# Patient Record
Sex: Male | Born: 1975 | Race: Black or African American | Hispanic: No | Marital: Married | Smoking: Never smoker
Health system: Southern US, Community
[De-identification: ages and names within clinical notes are randomized; demographics above are authoritative.]

## PROBLEM LIST (undated history)

## (undated) DIAGNOSIS — I639 Cerebral infarction, unspecified: Secondary | ICD-10-CM

## (undated) DIAGNOSIS — I10 Essential (primary) hypertension: Secondary | ICD-10-CM

## (undated) HISTORY — PX: NO PAST SURGERIES: SHX2092

## (undated) HISTORY — DX: Cerebral infarction, unspecified: I63.9

---

## 2014-10-20 ENCOUNTER — Other Ambulatory Visit: Payer: Self-pay | Admitting: Radiology

## 2014-10-20 ENCOUNTER — Other Ambulatory Visit (HOSPITAL_COMMUNITY): Payer: Self-pay | Admitting: Internal Medicine

## 2014-10-20 ENCOUNTER — Encounter: Payer: Self-pay | Admitting: *Deleted

## 2014-10-20 DIAGNOSIS — R531 Weakness: Secondary | ICD-10-CM

## 2014-10-21 ENCOUNTER — Encounter (HOSPITAL_COMMUNITY): Payer: Self-pay | Admitting: *Deleted

## 2014-10-21 ENCOUNTER — Inpatient Hospital Stay (HOSPITAL_COMMUNITY)
Admission: RE | Admit: 2014-10-21 | Discharge: 2014-11-18 | DRG: 057 | Disposition: A | Discharge status: Home Health | Payer: BC Managed Care – PPO | Source: Other Acute Inpatient Hospital | Attending: Physical Medicine & Rehabilitation | Admitting: Physical Medicine & Rehabilitation

## 2014-10-21 ENCOUNTER — Other Ambulatory Visit (HOSPITAL_COMMUNITY): Payer: Self-pay | Admitting: Physician Assistant

## 2014-10-21 ENCOUNTER — Ambulatory Visit (HOSPITAL_COMMUNITY): Admission: RE | Admit: 2014-10-21 | Payer: Self-pay | Source: Ambulatory Visit

## 2014-10-21 DIAGNOSIS — G8190 Hemiplegia, unspecified affecting unspecified side: Secondary | ICD-10-CM

## 2014-10-21 DIAGNOSIS — I6612 Occlusion and stenosis of left anterior cerebral artery: Secondary | ICD-10-CM | POA: Diagnosis present

## 2014-10-21 DIAGNOSIS — R4587 Impulsiveness: Secondary | ICD-10-CM | POA: Diagnosis present

## 2014-10-21 DIAGNOSIS — K5900 Constipation, unspecified: Secondary | ICD-10-CM | POA: Diagnosis not present

## 2014-10-21 DIAGNOSIS — G8110 Spastic hemiplegia affecting unspecified side: Secondary | ICD-10-CM | POA: Diagnosis not present

## 2014-10-21 DIAGNOSIS — I63512 Cerebral infarction due to unspecified occlusion or stenosis of left middle cerebral artery: Secondary | ICD-10-CM | POA: Diagnosis present

## 2014-10-21 DIAGNOSIS — I69391 Dysphagia following cerebral infarction: Secondary | ICD-10-CM

## 2014-10-21 DIAGNOSIS — I69390 Apraxia following cerebral infarction: Secondary | ICD-10-CM | POA: Diagnosis not present

## 2014-10-21 DIAGNOSIS — Z8673 Personal history of transient ischemic attack (TIA), and cerebral infarction without residual deficits: Secondary | ICD-10-CM

## 2014-10-21 DIAGNOSIS — R1312 Dysphagia, oropharyngeal phase: Secondary | ICD-10-CM | POA: Diagnosis present

## 2014-10-21 DIAGNOSIS — R12 Heartburn: Secondary | ICD-10-CM | POA: Diagnosis present

## 2014-10-21 DIAGNOSIS — I10 Essential (primary) hypertension: Secondary | ICD-10-CM | POA: Diagnosis present

## 2014-10-21 DIAGNOSIS — G8191 Hemiplegia, unspecified affecting right dominant side: Secondary | ICD-10-CM

## 2014-10-21 DIAGNOSIS — R482 Apraxia: Secondary | ICD-10-CM | POA: Diagnosis not present

## 2014-10-21 DIAGNOSIS — I69351 Hemiplegia and hemiparesis following cerebral infarction affecting right dominant side: Principal | ICD-10-CM

## 2014-10-21 DIAGNOSIS — I69320 Aphasia following cerebral infarction: Secondary | ICD-10-CM | POA: Diagnosis not present

## 2014-10-21 DIAGNOSIS — I69359 Hemiplegia and hemiparesis following cerebral infarction affecting unspecified side: Secondary | ICD-10-CM

## 2014-10-21 DIAGNOSIS — R531 Weakness: Secondary | ICD-10-CM

## 2014-10-21 HISTORY — DX: Essential (primary) hypertension: I10

## 2014-10-21 LAB — CBC
HEMATOCRIT: 41.4 % (ref 39.0–52.0)
Hemoglobin: 13.7 g/dL (ref 13.0–17.0)
MCH: 30.3 pg (ref 26.0–34.0)
MCHC: 33.1 g/dL (ref 30.0–36.0)
MCV: 91.6 fL (ref 78.0–100.0)
PLATELETS: 212 10*3/uL (ref 150–400)
RBC: 4.52 MIL/uL (ref 4.22–5.81)
RDW: 13.7 % (ref 11.5–15.5)
WBC: 7 10*3/uL (ref 4.0–10.5)

## 2014-10-21 LAB — CREATININE, SERUM
Creatinine, Ser: 1.36 mg/dL — ABNORMAL HIGH (ref 0.61–1.24)
GFR calc Af Amer: >60 mL/min (ref >60)
GFR calc non Af Amer: >60 mL/min (ref >60)

## 2014-10-21 MED ORDER — ONDANSETRON HCL 4 MG PO TABS: 4.0000 mg | ORAL_TABLET | Freq: Four times a day (QID) | ORAL | Status: DC | PRN

## 2014-10-21 MED ORDER — STARCH (THICKENING) PO POWD: ORAL | Status: DC | PRN

## 2014-10-21 MED ORDER — SODIUM CHLORIDE 0.45 % IV SOLN
INTRAVENOUS | Status: DC
  Administered 2014-10-21 – 2014-10-26 (×5): via INTRAVENOUS

## 2014-10-21 MED ORDER — ACETAMINOPHEN 325 MG PO TABS
325.0000 mg | ORAL_TABLET | ORAL | Status: DC | PRN
  Administered 2014-10-22 – 2014-10-29 (×8): 650 mg via ORAL
  Filled 2014-10-21 (×9): qty 2

## 2014-10-21 MED ORDER — RESOURCE THICKENUP CLEAR PO POWD
ORAL | Status: DC | PRN
  Filled 2014-10-21: qty 125

## 2014-10-21 MED ORDER — SORBITOL 70 % SOLN
60.0000 mL | Status: AC
  Administered 2014-10-21: 60 mL via ORAL
  Filled 2014-10-21: qty 60

## 2014-10-21 MED ORDER — ONDANSETRON HCL 4 MG/2ML IJ SOLN: 4.0000 mg | Freq: Four times a day (QID) | INTRAMUSCULAR | Status: DC | PRN

## 2014-10-21 MED ORDER — PANTOPRAZOLE SODIUM 40 MG PO TBEC
40.0000 mg | DELAYED_RELEASE_TABLET | Freq: Every day | ORAL | Status: DC
  Administered 2014-10-22 – 2014-10-26 (×4): 40 mg via ORAL
  Filled 2014-10-21 (×5): qty 1

## 2014-10-21 MED ORDER — ENOXAPARIN SODIUM 40 MG/0.4ML ~~LOC~~ SOLN
40.0000 mg | SUBCUTANEOUS | Status: DC
  Administered 2014-10-21 – 2014-10-30 (×7): 40 mg via SUBCUTANEOUS
  Filled 2014-10-21 (×9): qty 0.4

## 2014-10-21 MED ORDER — ASPIRIN 325 MG PO TABS
325.0000 mg | ORAL_TABLET | Freq: Every day | ORAL | Status: DC
  Administered 2014-10-22 – 2014-11-18 (×26): 325 mg via ORAL
  Filled 2014-10-21 (×30): qty 1

## 2014-10-21 MED ORDER — SORBITOL 70 % SOLN
30.0000 mL | Freq: Every day | Status: DC | PRN
  Administered 2014-10-22: 30 mL via ORAL
  Filled 2014-10-21: qty 30

## 2014-10-21 NOTE — PMR Pre-admission (Signed)
[facility] PMR Admission Coordinator Pre-Admission Assessment  Patient: Mario [name] is an 39 y.o., male MRN: [id number] DOB: Nov 22, 1975 Height: '5\' 11"'  (180.3 cm) Weight: 103.42 kg (228 lb)  Insurance Information HMO: PPO: PCP: IPA: 80/20: OTHER: State Blue Options PRIMARY: [place] Plan Policy#: [id number] Subscriber: self CM Name: Azucena Kuba, RN Phone#: [phone] Fax#: [phone] Approval given on 10-21-14 for 2 weeks and updates due to [facility] at above fax on 9-48-01 Pre-Cert#: [id number] Employer: [place] Benefits: Phone #: [phone] Name: online Eff. Date: 06-05-14 Deduct: $1054 (none met) Out of Pocket Max: [phone] (none met) Life Max: none CIR: 70/30%, $329 copay per admit, pre-auth needed SNF: 70/30%, 100 days max Outpatient: 70/30% Co-Pay: $72 copay, no visit limits Home Health: 70/30% Co-Pay: none, no visit limits DME: 70/30% Co-Pay: none Providers: in network  Emergency Contact Information Contact Information    Name Relation Home Work Mobile   Melchor,Tangii Spouse   [phone]      Current Medical History  Patient Admitting Diagnosis: L MCA CVA  History of Present Illness: 39 year old right handed male with history of hypertension maintained on hydrochlorothiazide. 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 He presented to [facility] 10/19/2014 with acute onset of right sided weakness, headache and inability to speak. Cranial CT scan negative. MRI of the head showed moderate volume of patchy acute infarct left MCA territory. Most confluent involvement at the corona radiata and lentiform nuclei. No mass effect. Carotid Dopplers with no ICA stenosis. 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 Collateral supply of left MCA branch vessels decreased in number and caliber in the area of acute infarct. Carotid  Dopplers with ejection fraction of 65% without emboli. Patient did not receive TPA. Placed on aspirin for CVA prophylaxis. Subcutaneous Lovenox for DVT prophylaxis. Currently on a pured diet with honey thick liquids. Therapies have been initiated. Requiring min mod assist for basic mobility. Patient was admitted for comprehensive rehabilitation program.  Patient's medical record from [facility] has been reviewed by the rehabilitation admission coordinator and physician.  Past Medical History  No past medical history on file.  Family History  family history is not on file.  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 He enjoys hunting and fishing and is involved in his church.    Home Assistive Devices / Equipment Home Assistive Devices/Equipment: None   Prior Functional Level Current Functional Level  Bed Mobility  Independent  Mod assist   Transfers  Independent  Mod assist (Mod assist x 2 with R LE buckling)   Mobility - Walk/Wheelchair  Independent  Mod assist (Mod assist x 2 to amb. 2' to chair, R LE buckling)   Upper Body Dressing  Independent  Mod assist   Lower Body Dressing  Independent  Other (not assessed, anticipate needs)   Grooming  Independent  Other (not assessed, anticipate needs, Pt is R handed (flaccid R UE))   Eating/Drinking  Independent  Other (SLP note says difficulty initiating oral motor mvmts)   Toilet Transfer  Independent  Other (not assessed, anticipate  needs)   Bladder Continence  WFL  currently using urinal with assist   Bowel Management  WFL  last BM on 5-15 per wife   Stair Climbing   Independent  Other (not assessed, anticipate needs)   Communication  Independent  global aphasia, sometimes nods appropriately to ?s   Memory  WFL  unable to assess   Cooking/Meal Prep  Independent     Housework  Independent    Money Management  Independent    Driving   Independent     Special needs/care consideration BiPAP/CPAP no CPM no  Continuous Drip IV no  Dialysis no  Life Vest no Oxygen no  Special Bed no  Trach Size no Wound Vac (area) no  Skin - no current  Bowel mgmt: last BM on 5-15 per wife Bladder mgmt: currently using urinal with assist Diabetic mgmt no  Previous Home Environment Living Arrangements: Spouse/significant other Lives With: Spouse Available Help at Discharge: Family Type of Home: House Home Layout: One level  Discharge Living Setting Plans for Discharge Living Setting: Patient's home Type of Home at Discharge: House Discharge Home Layout: One level Does the patient have any problems obtaining your medications?: No  Social/Family/Support Systems Patient Roles: Spouse, Other (Comment) (works full time as Architect, enjoys hunting/fishing) Sport and exercise psychologist Information: wife Marylou Flesher is primary contact Anticipated Caregiver: wife and supportive family (pt's parents) Anticipated Caregiver's Contact Information: see above Ability/Limitations of Caregiver: Wife is a Education officer, museum and will be out of school in early June. She will be avail 24-7 after that. In addition, pt's parents are very supportive. Caregiver Availability: 24/7 Discharge Plan Discussed with Primary Caregiver: Yes Is Caregiver In Agreement with Plan?: Yes Does Caregiver/Family have Issues with Lodging/Transportation while Pt is in Rehab?:  No  Goals/Additional Needs Patient/Family Goal for Rehab: Supervision and min assist with PT/OT, min assist with SLP Expected length of stay: 14-18 days Cultural Considerations: pt and his wife are very involved in their [place] Needs: pured diet with honey thick liquids, SLP notes motor apraxia with swallowing. Equipment Needs: to be determined Pt/Family Agrees to Admission and willing to participate: Yes Program Orientation Provided & Reviewed with Pt/Caregiver Including Roles & Responsibilities: Yes  Patient Condition: I received this referral on 10-20-14 and have reviewed pt's case with rehab team. 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 Pt was admitted to [facility] on 10-19-14 after waking up unable to speak and with R UE/LE weakness. Work up revealed L MCA CVA. Pt is currently requiring moderate assist of 2 for limited transfers and limited gait to chair with a flaccid right UE/LE. In addition, pt has global aphasia and is following commands inconsistently. Pt will benefit from further skilled speech services to address pt's communication needs/global aphasia and for diet advancement beyond his current honey puree diet. Also, skilled SLP services will address cognitive issues associated with L MCA CVA. Pt will benefit greatly from the multi-disciplinary team of skilled PT, OT and rehab nursing to maximize his functional return following this new stroke. PT, OT and rehab nursing will focus on increasing strength for greater independence with bed mobility, transfers, gait and self care skills. Rehab nursing will focus on pt/family education for medication management, stroke education and any bowel/bladder care needs. Pt will also benefit from rehab physician intervention to monitor his medical management following this new CVA in a patient with no significant past medical history. Discussed  case with rehab team  and Dr. Naaman Plummer who feel that pt is a good inpatient rehab candidate. We received medical clearance from MD at [facility]. Pt and his wife are motivated to come to inpatient rehab and will benefit from the intensive services of skilled therapy under rehab physician guidance. Pt will be admitted today on 10-21-14.  Preadmission Screen Completed By: Nanetta Batty, PT, 10/21/2014 9:49 AM ______________________________________________________________________  Discussed status with Dr. Naaman Plummer on 10-21-14 at 0930 and received telephone approval for admission today.  Admission Coordinator: Nanetta Batty, PT, time 0930/Date 10-21-14   Assessment/Plan: Diagnosis: left MCA infarct 1. Does the need for close, 24 hr/day Medical supervision in concert with the patient's rehab needs make it unreasonable for this patient to be served in a less intensive setting? Yes 2. Co-Morbidities requiring supervision/potential complications: htn, post-stroke sequelae 3. Due to bladder management, bowel management, safety, skin/wound care, disease management, medication administration, pain management and patient education, does the patient require 24 hr/day rehab nursing? Yes 4. Does the patient require coordinated care of a physician, rehab nurse, PT (1-2 hrs/day, 5 days/week), OT (1-2 hrs/day, 5 days/week) and SLP (1-2 hrs/day, 5 days/week) to address physical and functional deficits in the context of the above medical diagnosis(es)? Yes Addressing deficits in the following areas: balance, endurance, locomotion, strength, transferring, bowel/bladder control, bathing, dressing, feeding, grooming, toileting, cognition, speech, language, swallowing and psychosocial support 5. Can the patient actively participate in an intensive therapy program of at least 3 hrs of therapy 5 days a week? Yes 6. The potential for patient to make measurable gains while on inpatient rehab is  excellent 7. Anticipated functional outcomes upon discharge from inpatients are: supervision and min assist PT, supervision and min assist OT, min assist SLP 8. Estimated rehab length of stay to reach the above functional goals is: 14-18 days 9. Does the patient have adequate social supports to accommodate these discharge functional goals? Yes 10. Anticipated D/C setting: Home 11. Anticipated post D/C treatments: HH therapy and Outpatient therapy 12. Overall Rehab/Functional Prognosis: excellent    RECOMMENDATIONS: This patient's condition is appropriate for continued rehabilitative care in the following setting: CIR Patient has agreed to participate in recommended program. Yes Note that insurance prior authorization may be required for reimbursement for recommended care.  Comment: admit to inpatient rehab today  Meredith Staggers, MD, [place] 10/21/2014   Nanetta Batty, PT 10/21/2014

## 2014-10-21 NOTE — Plan of Care (Signed)
Problem: RH BOWEL ELIMINATION Goal: RH STG MANAGE BOWEL WITH ASSISTANCE STG Manage Bowel with Assistance.  Outcome: Not Progressing LBM unknown sorbitol given  Goal: RH STG MANAGE BOWEL W/MEDICATION W/ASSISTANCE STG Manage Bowel with Medication with Assistance.  Outcome: Not Progressing LBM unknown sorbitol given

## 2014-10-21 NOTE — Progress Notes (Signed)
Patient arrived to unit at 1500 from [facility]. Patient welcomed to unit with spouse and father at bedside. Review the rehab booklet and process with family and patient. Educated on safety and importance of safety plan with family with verbal understanding. Bed alarm in place side rails times 3  , call bell within reach. Family at bedside

## 2014-10-21 NOTE — PMR Pre-admission (Deleted)
[facility] PMR Admission Coordinator Pre-Admission Assessment  Patient: Mario [name] is an 39 y.o., male MRN: [id number] DOB: March 05, 1976 Height: '5\' 11"'  (180.3 cm) Weight: 103.42 kg (228 lb)  Insurance Information HMO:     PPO:      PCP:      IPA:      80/20:      OTHER: State Blue Options PRIMARY: [place] Plan      Policy#: [id number]      Subscriber: self CM Name: Azucena Kuba, RN      Phone#: [phone]     Fax#: [phone] Approval given on 10-21-14 for 2 weeks and updates due to [facility] at above fax on 10-12-30 Pre-Cert#: [id number]      Employer: [place] Benefits:  Phone #: [phone]     Name: online Eff. Date: 06-05-14     Deduct: $1054 (none met)      Out of Pocket Max: [phone] (none met)      Life Max: none CIR: 70/30%, $329 copay per admit, pre-auth needed      SNF: 70/30%, 100 days max Outpatient: 70/30%     Co-Pay: $72 copay, no visit limits Home Health: 70/30%      Co-Pay: none, no visit limits DME: 70/30%     Co-Pay: none Providers: in network  Emergency Contact Information Contact Information    Name Relation Home Work Mobile   Reindl,Tangii Spouse   [phone]      Current Medical History  Patient Admitting Diagnosis: L MCA CVA  History of Present Illness: 39 year old right handed male with history of hypertension maintained on hydrochlorothiazide. 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 Cranial CT scan negative. MRI of the head showed moderate volume of patchy acute infarct left MCA territory. Most confluent involvement at the corona radiata and lentiform nuclei. No mass effect. Carotid Dopplers with no ICA stenosis. 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 Collateral supply of left MCA branch vessels decreased in number and caliber in the area of acute infarct. Carotid Dopplers with  ejection fraction of 65% without emboli. Patient did not receive TPA. Placed on aspirin for CVA prophylaxis. Subcutaneous Lovenox for DVT prophylaxis. Currently on a pured diet with honey thick liquids. Therapies have been initiated. Requiring min mod assist for basic mobility. Patient was admitted for comprehensive rehabilitation program.  Patient's medical record from [facility] has been reviewed by the rehabilitation admission coordinator and physician.  Past Medical History  No past medical history on file.  Family History  family history is not on file.  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 He enjoys hunting and fishing and is involved in his church.    Home Assistive Devices / Equipment Home Assistive Devices/Equipment: None   Prior Functional Level Current Functional Level  Bed Mobility  Independent  Mod assist   Transfers  Independent  Mod assist (Mod assist x 2 with R LE buckling)   Mobility - Walk/Wheelchair  Independent  Mod assist (Mod assist x 2 to amb. 2' to chair, R LE buckling)   Upper Body Dressing  Independent  Mod assist   Lower Body Dressing  Independent  Other (not assessed, anticipate needs)   Grooming  Independent  Other (not assessed, anticipate needs, Pt is R handed (flaccid R UE))   Eating/Drinking  Independent  Other (SLP note says difficulty initiating oral motor mvmts)   Toilet Transfer  Independent  Other (not assessed, anticipate needs)   Bladder Continence   WFL  currently using  urinal with assist   Bowel Management  WFL  last BM on 5-15 per wife   Stair Climbing   Independent  Other (not assessed, anticipate needs)   Communication  Independent  global aphasia, sometimes nods appropriately to ?s   Memory  WFL  unable to assess   Cooking/Meal Prep  Independent      Housework  Independent    Money Management  Independent    Driving   Independent     Special needs/care consideration BiPAP/CPAP no CPM no  Continuous Drip IV no  Dialysis no         Life Vest no Oxygen no  Special Bed no  Trach Size no Wound Vac (area) no       Skin - no current                                Bowel mgmt: last BM on 5-15 per wife Bladder mgmt: currently using urinal with assist Diabetic mgmt no  Previous Home Environment Living Arrangements: Spouse/significant other  Lives With: Spouse Available Help at Discharge: Family Type of Home: House Home Layout: One level  Discharge Living Setting Plans for Discharge Living Setting: Patient's home Type of Home at Discharge: House Discharge Home Layout: One level Does the patient have any problems obtaining your medications?: No  Social/Family/Support Systems Patient Roles: Spouse, Other (Comment) (works full time as Architect, enjoys hunting/fishing) Sport and exercise psychologist Information: wife Marylou Flesher is primary contact Anticipated Caregiver: wife and supportive family (pt's parents) Anticipated Caregiver's Contact Information: see above Ability/Limitations of Caregiver: Wife is a Education officer, museum and will be out of school in early June. She will be avail 24-7 after that. In addition, pt's parents are very supportive. Caregiver Availability: 24/7 Discharge Plan Discussed with Primary Caregiver: Yes Is Caregiver In Agreement with Plan?: Yes Does Caregiver/Family have Issues with Lodging/Transportation while Pt is in Rehab?: No  Goals/Additional Needs Patient/Family Goal for Rehab: Supervision and min assist with PT/OT, min  assist with SLP Expected length of stay: 14-18 days Cultural Considerations: pt and his wife are very involved in their [place] Needs: pured diet with honey thick liquids, SLP notes motor apraxia with swallowing. Equipment Needs: to be determined Pt/Family Agrees to Admission and willing to participate: Yes Program Orientation Provided & Reviewed with Pt/Caregiver Including Roles  & Responsibilities: Yes  Patient Condition: I received this referral on 10-20-14 and have reviewed pt's case with rehab team. 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 Pt was admitted to [facility] on 10-19-14 after waking up unable to speak and with R UE/LE weakness. Work up revealed L MCA CVA. Pt is currently requiring moderate assist of 2 for limited transfers and limited gait to chair with a flaccid right UE/LE. In addition, pt has global aphasia and  is following commands inconsistently. Pt will benefit from further skilled speech services to address pt's communication needs/global aphasia and for diet advancement beyond his current honey puree diet. Also, skilled SLP services will address cognitive issues associated with L MCA CVA. Pt will benefit greatly from the multi-disciplinary team of skilled PT, OT and rehab nursing to maximize his functional return following this new stroke. PT, OT and rehab nursing will focus on increasing strength for greater independence with bed mobility, transfers, gait and self care skills. Rehab nursing will focus on pt/family education for medication management, stroke education and any bowel/bladder care needs. Pt will also benefit from rehab physician intervention to monitor his medical management following this new CVA in a patient with no significant past medical history. Discussed case with rehab team and Dr. Naaman Plummer who feel that pt is a good inpatient rehab candidate. We received  medical clearance from MD at [facility]. Pt and his wife are motivated to come to inpatient rehab and will benefit from the intensive services of skilled therapy under rehab physician guidance. Pt will be admitted today on 10-21-14.  Preadmission Screen Completed By:  Nanetta Batty, PT, 10/21/2014 9:49 AM ______________________________________________________________________   Discussed status with Dr. Naaman Plummer on 10-21-14 at 0930 and received telephone approval for admission today.  Admission Coordinator:  Nanetta Batty, PT, time 0930/Date  10-21-14   Assessment/Plan: Diagnosis: left MCA infarct 1. Does the need for close, 24 hr/day  Medical supervision in concert with the patient's rehab needs make it unreasonable for this patient to be served in a less intensive setting? Yes 2. Co-Morbidities requiring supervision/potential complications: htn, post-stroke sequelae 3. Due to bladder management, bowel management, safety, skin/wound care, disease management, medication administration, pain management and patient education, does the patient require 24 hr/day rehab nursing? Yes 4. Does the patient require coordinated care of a physician, rehab nurse, PT (1-2 hrs/day, 5 days/week), OT (1-2 hrs/day, 5 days/week) and SLP (1-2 hrs/day, 5 days/week) to address physical and functional deficits in the context of the above medical diagnosis(es)? Yes Addressing deficits in the following areas: balance, endurance, locomotion, strength, transferring, bowel/bladder control, bathing, dressing, feeding, grooming, toileting, cognition, speech, language, swallowing and psychosocial support 5. Can the patient actively participate in an intensive therapy program of at least 3 hrs of therapy 5 days a week? Yes 6. The potential for patient to make measurable gains while on inpatient rehab is excellent 7. Anticipated functional outcomes upon discharge from inpatients are: supervision and min assist PT,  supervision and min assist OT, min assist SLP 8. Estimated rehab length of stay to reach the above functional goals is: 14-18 days 9. Does the patient have adequate social supports to accommodate these discharge functional goals? Yes 10. Anticipated D/C setting: Home 11. Anticipated post D/C treatments: HH therapy and Outpatient therapy 12. Overall Rehab/Functional Prognosis: excellent    RECOMMENDATIONS: This patient's condition is appropriate for continued rehabilitative care in the following setting: CIR Patient has agreed to participate in recommended program. Yes Note that insurance prior authorization may be required for reimbursement for recommended care.  Comment: admit to inpatient rehab today  Meredith Staggers, MD, [place] 10/21/2014   Nanetta Batty, PT 10/21/2014

## 2014-10-21 NOTE — H&P (Signed)
Expand All Collapse All      Physical Medicine and Rehabilitation Admission H&P   Chief complaint: Weakness  HPI: 39 year old right handed male with history of hypertension maintained on hydrochlorothiazide. 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 [facility] with acute onset of right sided weakness, headache and inability to speak. Cranial CT scan negative. MRI of the head showed moderate volume of patchy acute infarct left MCA territory. Most confluent involvement at the corona radiata and lentiform nuclei. No mass effect. Carotid Dopplers with no ICA stenosis. 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 Collateral supply of left MCA branch vessels decreased in number and caliber in the area of acute infarct. Carotid Dopplers with ejection fraction of 65% without emboli. Patient did not receive TPA. Placed on aspirin for CVA prophylaxis. Subcutaneous Lovenox for DVT prophylaxis. Currently on a pured diet with honey thick liquids. Therapies have been initiated. Requiring min mod assist for basic mobility. Patient was admitted for comprehensive rehabilitation program  Review of Systems  Gastrointestinal: Positive for heartburn.  Neurological: Positive for weakness.  All other systems reviewed and are negative.    Family history of hypertension  Social History: Denies tobacco or alcohol  Allergies: No allergies Medications prior to admission. Hydrochlorothiazide 12.5 mg daily  Home:   independent prior to admission living with his wife. He works as a Airline pilottax auditor. One level home  Functional History:   independent prior to admission  Functional Status:  Mobility:     minimal assist supine to sit and sit to supine. Moderate assist bed to chair transfers.      ADL:   min mod assist  Cognition:     aphasic  Physical Exam: 128/70. Pulse 78. Respirations 18. Temperature 90.8 Physical Exam  Vitals  reviewed. Constitutional: He appears well-developed. No distress. Large framed man HENT: dentition good. Oral mucosa pink and moist Head: Normocephalic.  Eyes: EOM are normal.  Neck: Normal range of motion. Neck supple. No thyromegaly present.  Cardiovascular: Normal rate and regular rhythm. no murmur Respiratory: Effort normal and breath sounds normal. No respiratory distress. No wheezes, rales GI: Soft. Bowel sounds are normal. He exhibits no distension.  Neurological: He is alert.  Global aphasia. Follows visual use. Makes eye contact with examiner. Can follow some basic commands with heavy, tactile cues----otherwise does not follow commands. RUE and RLE 0/5 prox to distal. Does not sense pain on right side. Does sense pain on left. Moves left side spontaneously. Skin: Skin is warm and dry.  Psych: generally smilling and cooperative   Lab Results Last 48 Hours    No results found for this or any previous visit (from the past 48 hour(s)).    Imaging Results (Last 48 hours)    No results found.       Medical Problem List and Plan: 1. 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 DVT Prophylaxis/Anticoagulation: Subcutaneous Lovenox. Monitor platelet counts and any signs of bleeding 3. Pain Management: Tylenol as needed 4. Dysphagia. Dysphagia 1 honey liquids. Monitor hydration, encourage po 5. Neuropsych: This patient is capable of making decisions on his own behalf. 6. Skin/Wound Care: Routine skin checks 7. Fluids/Electrolytes/Nutrition: Routine eye and nose with follow-up chemistries 8. Hypertension. No current antihypertensive medication. Patient on hydrochlorothiazide 12.5 mg daily prior to admission. Monitor with increased mobility     Post Admission Physician Evaluation: 1. Functional deficits secondary to left MCA infarct. 2. Patient is admitted to  receive collaborative, interdisciplinary care between the physiatrist, rehab nursing staff, and therapy  team. 3. Patient's level of medical complexity and substantial therapy needs in context of that medical necessity cannot be provided at a lesser intensity of care such as a SNF. 4. Patient has experienced substantial functional loss from his/her baseline which was documented above under the "Functional History" and "Functional Status" headings. Judging by the patient's diagnosis, physical exam, and functional history, the patient has potential for functional progress which will result in measurable gains while on inpatient rehab. These gains will be of substantial and practical use upon discharge in facilitating mobility and self-care at the household level. 5. Physiatrist will provide 24 hour management of medical needs as well as oversight of the therapy plan/treatment and provide guidance as appropriate regarding the interaction of the two. 6. 24 hour rehab nursing will assist with bladder management, bowel management, safety, skin/wound care, disease management, medication administration and patient education and help integrate therapy concepts, techniques,education, etc. 7. PT will assess and treat for/with: Lower extremity strength, range of motion, stamina, balance, functional mobility, safety, adaptive techniques and equipment, NMR, cognitive perceptual awareness, family education, stroke education. Goals are: min assist. 8. OT will assess and treat for/with: ADL's, functional mobility, safety, upper extremity strength, adaptive techniques and equipment, NMR, cognitive perceptual rx, language integration, family education, ego support. Goals are: min assist. Therapy may proceed with showering this patient. 9. SLP will assess and treat for/with: cognition, language, communication, swallowing. Goals are: min to mod assist. 10. Case Management and Social Worker will assess and treat for psychological issues and discharge planning. 11. Team conference will be held weekly to assess progress  toward goals and to determine barriers to discharge. 12. Patient will receive at least 3 hours of therapy per day at least 5 days per week. 13. ELOS: 20-30 days  14. Prognosis: good     Ranelle OysterZachary T. Swartz, MD, [facility] [place] [facility] 10/21/2014  10/21/2014

## 2014-10-22 ENCOUNTER — Inpatient Hospital Stay (HOSPITAL_COMMUNITY): Payer: BC Managed Care – PPO | Admitting: Rehabilitation

## 2014-10-22 ENCOUNTER — Inpatient Hospital Stay (HOSPITAL_COMMUNITY): Payer: BC Managed Care – PPO

## 2014-10-22 ENCOUNTER — Inpatient Hospital Stay (HOSPITAL_COMMUNITY): Payer: BC Managed Care – PPO | Admitting: Speech Pathology

## 2014-10-22 ENCOUNTER — Inpatient Hospital Stay (HOSPITAL_COMMUNITY): Payer: BC Managed Care – PPO | Admitting: Occupational Therapy

## 2014-10-22 ENCOUNTER — Encounter (HOSPITAL_COMMUNITY): Payer: Self-pay | Admitting: Physician Assistant

## 2014-10-22 LAB — CBC WITH DIFFERENTIAL/PLATELET
BASOS PCT: 0 % (ref 0–1)
Basophils Absolute: 0 10*3/uL (ref 0.0–0.1)
EOS ABS: 0.1 10*3/uL (ref 0.0–0.7)
Eosinophils Relative: 2 % (ref 0–5)
HEMATOCRIT: 40.4 % (ref 39.0–52.0)
HEMOGLOBIN: 13.5 g/dL (ref 13.0–17.0)
Lymphocytes Relative: 29 % (ref 12–46)
Lymphs Abs: 1.5 10*3/uL (ref 0.7–4.0)
MCH: 30.8 pg (ref 26.0–34.0)
MCHC: 33.4 g/dL (ref 30.0–36.0)
MCV: 92 fL (ref 78.0–100.0)
MONO ABS: 0.4 10*3/uL (ref 0.1–1.0)
MONOS PCT: 7 % (ref 3–12)
NEUTROS ABS: 3.3 10*3/uL (ref 1.7–7.7)
Neutrophils Relative %: 62 % (ref 43–77)
Platelets: 199 10*3/uL (ref 150–400)
RBC: 4.39 MIL/uL (ref 4.22–5.81)
RDW: 13.8 % (ref 11.5–15.5)
WBC: 5.2 10*3/uL (ref 4.0–10.5)

## 2014-10-22 LAB — PROTIME-INR
INR: 1.13 (ref 0.00–1.49)
PROTHROMBIN TIME: 14.7 s (ref 11.6–15.2)

## 2014-10-22 LAB — COMPREHENSIVE METABOLIC PANEL
ALBUMIN: 3.5 g/dL (ref 3.5–5.0)
ALK PHOS: 36 U/L — AB (ref 38–126)
ALT: 30 U/L (ref 17–63)
AST: 32 U/L (ref 15–41)
Anion gap: 7 (ref 5–15)
BUN: 8 mg/dL (ref 6–20)
CALCIUM: 8.6 mg/dL — AB (ref 8.9–10.3)
CO2: 23 mmol/L (ref 22–32)
CREATININE: 1.31 mg/dL — AB (ref 0.61–1.24)
Chloride: 108 mmol/L (ref 101–111)
GFR calc Af Amer: >60 mL/min (ref >60)
GFR calc non Af Amer: >60 mL/min (ref >60)
GLUCOSE: 93 mg/dL (ref 65–99)
POTASSIUM: 3.6 mmol/L (ref 3.5–5.1)
Sodium: 138 mmol/L (ref 135–145)
TOTAL PROTEIN: 6.9 g/dL (ref 6.5–8.1)
Total Bilirubin: 1.2 mg/dL (ref 0.3–1.2)

## 2014-10-22 NOTE — Care Management Note (Signed)
Inpatient Rehabilitation Center Individual Statement of Services  Patient Name:  Mario [name]  Date:  10/22/2014  Welcome to the Inpatient Rehabilitation Center.  Our goal is to provide you with an individualized program based on your diagnosis and situation, designed to meet your specific needs.  With this comprehensive rehabilitation program, you will be expected to participate in at least 3 hours of rehabilitation therapies Monday-Friday, with modified therapy programming on the weekends.  Your rehabilitation program will include the following services:  Physical Therapy (PT), Occupational Therapy (OT), Speech Therapy (ST), 24 hour per day rehabilitation nursing, Therapeutic Recreaction (TR), Neuropsychology, Case Management (Social Worker), Rehabilitation Medicine, Nutrition Services and Pharmacy Services  Weekly team conferences will be held on Wednesday to discuss your progress.  Your Social Worker will talk with you frequently to get your input and to update you on team discussions.  Team conferences with you and your family in attendance may also be held.  Expected length of stay: 25-28 days  Overall anticipated outcome: supervision/min level  Depending on your progress and recovery, your program may change. Your Social Worker will coordinate services and will keep you informed of any changes. Your Social Worker's name and contact numbers are listed  below.  The following services may also be recommended but are not provided by the Inpatient Rehabilitation Center:   Driving Evaluations  Home Health Rehabiltiation Services  Outpatient Rehabilitation Services  Vocational Rehabilitation   Arrangements will be made to provide these services after discharge if needed.  Arrangements include referral to agencies that provide these services.  Your insurance has been verified to be:  [employer] Your primary doctor is:    Pertinent information will be shared with your doctor and your  insurance company.  Social Worker:  Dossie DerBecky Larico Dimock, SW [phone] or [phone]  Information discussed with and copy given to patient by: Lucy Chrisupree, Dandra Shambaugh G, 10/22/2014, 11:31 AM

## 2014-10-22 NOTE — H&P (Signed)
Chief Complaint: Stroke  Referring Physician(s): Dr. Rebecca EatonSopala  History of Present Illness: Mario [name] is a 39 y.o. male who is right handed with a recent history of acute onset of right sided weakness, headache and inability to speak. [facility]. MRI of the head showed moderate volume of patchy acute infarct left MCA territory. No mass effect. Carotid Dopplers with no ICA stenosis.  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 Collateral supply of left MCA branch vessels decreased in number and caliber in the area of acute infarct.  Mario [name] is currently in the rehab center undergoing aggressive Speech therapy, physical therapy, and occupational therapy. We are asked to evaluate him for carotid/cerebral angiogram by Dr. Corliss Skainseveshwar.  Past Medical History  Diagnosis Date  . Hypertension     Past Surgical History  Procedure Laterality Date  . No past surgeries      Allergies: Review of patient's allergies indicates no known allergies.  Medications: Prior to Admission medications   Medication Sig Start Date End Date Taking? Authorizing Provider  Calcium Carbonate-Vitamin D (CALCIUM-D) 600-400 MG-UNIT TABS Take 1 tablet by mouth daily.   Yes Historical Provider, MD  Multiple Vitamins-Minerals (MULTIVITAMIN ADULT) TABS Take 1 tablet by mouth daily.   Yes Historical Provider, MD  Omega 3 1000 MG CAPS Take 100 mg by mouth daily.   Yes Historical Provider, MD  Polyvinyl Alcohol-Povidone (REFRESH OP) Place 1 drop into both eyes daily as needed (dry eyes).   Yes Historical Provider, MD  hydrochlorothiazide (HYDRODIURIL) 25 MG tablet Take 25 mg by mouth daily. 09/30/14   Historical Provider, MD  NOREL AD 4-10-325 MG TABS Take 1 tablet by mouth every 6 (six) hours as needed (cold symptoms).  10/01/14   Historical Provider, MD     History reviewed. No pertinent family history.  History   Social History  . Marital Status: Married    Spouse Name: N/A  .  Number of Children: N/A  . Years of Education: N/A   Social History Main Topics  . Smoking status: Never Smoker   . Smokeless tobacco: Not on file  . Alcohol Use: No  . Drug Use: No  . Sexual Activity: Not on file   Other Topics Concern  . None   Social History Narrative     Review of Systems: A 12 point ROS discussed and pertinent positives are indicated in the HPI above.  All other systems are negative.  Review of Systems  Unable to perform ROS: Other  Unable to obtain ROS due to Global Aphasia.  Vital Signs: BP 137/88 mmHg  Pulse 73  Temp(Src) 98.2 F (36.8 C) (Oral)  Resp 18  Ht 6' (1.829 m)  Wt 226 lb 13.7 oz (102.9 kg)  BMI 30.76 kg/m2  SpO2 96%  Physical Exam  Constitutional: He appears well-developed and well-nourished.  HENT:  Head: Normocephalic and atraumatic.  Eyes: EOM are normal.  Neck: Normal range of motion. Neck supple.  Cardiovascular: Normal rate, regular rhythm and normal heart sounds.   Pulmonary/Chest: Effort normal. He has wheezes.  Abdominal: Soft. Bowel sounds are normal. He exhibits no distension. There is no tenderness.  Musculoskeletal:  Right arm flacid secondary to left CVA  Neurological: He is alert. A cranial nerve deficit is present.  Global Aphasia Inability to stick out tongue. Follows commands  Skin: Skin is warm and dry.  Psychiatric: He has a normal mood and affect. His behavior is normal.  Nursing  note and vitals reviewed.   Mallampati Score:  MD Evaluation Airway: WNL Heart: WNL Abdomen: WNL Chest/ Lungs: WNL ASA  Classification: 3 Mallampati/Airway Score: Two  Imaging: No results found.  Labs:  CBC:  Recent Labs  10/21/14 1633 10/22/14 0600  WBC 7.0 5.2  HGB 13.7 13.5  HCT 41.4 40.4  PLT 212 199    COAGS: No results for input(s): INR, APTT in the last 8760 hours.  BMP:  Recent Labs  10/21/14 1633 10/22/14 0600  NA  --  138  K  --  3.6  CL  --  108  CO2  --  23  GLUCOSE  --  93  BUN   --  8  CALCIUM  --  8.6*  CREATININE 1.36* 1.31*  GFRNONAA >60 >60  GFRAA >60 >60    LIVER FUNCTION TESTS:  Recent Labs  10/22/14 0600  BILITOT 1.2  AST 32  ALT 30  ALKPHOS 36*  PROT 6.9  ALBUMIN 3.5    TUMOR MARKERS: No results for input(s): AFPTM, CEA, CA199, CHROMGRNA in the last 8760 hours.  Assessment and Plan:  Stroke. Will plan for Cerebral/Carotid Angiography today by Dr. Corliss Skainseveshwar.  Risks and Benefits discussed with the patient and his wife including, but not limited to bleeding, infection, vascular injury, contrast induced renal failure, stroke or even death. All of the patient's questions were answered, patient and his wife are agreeable to proceed. Consent signed and in chart.  Thank you for this interesting consult.  I greatly enjoyed meeting Mario DrownJerome A Favia and look forward to participating in their care.  SignedGolden Pop: Tylin Stradley R 10/22/2014, 11:52 AM   I spent a total of 20 minutes in face to face in clinical consultation, greater than 50% of which was counseling/coordinating care for Carotid/Cerebral angiogram by Dr. Corliss Skainseveshwar

## 2014-10-22 NOTE — Evaluation (Signed)
Speech Language Pathology Assessment and Plan  Patient Details  Name: Mario [name] MRN: [id number] Date of Birth: 06-May-1976  SLP Diagnosis: Aphasia;Apraxia;Dysphagia;Cognitive Impairments  Rehab Potential: Good ELOS: 21-28 days     Today's Date: 10/22/2014 SLP Individual Time: 1005-1100 SLP Individual Time Calculation (min): 55 min   Problem List:  Patient Active Problem List   Diagnosis Date Noted  . 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 Right hemiparesis 10/21/2014  . 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 Hypertension    Past Surgical History:  Past Surgical History  Procedure Laterality Date  . No past surgeries      Assessment / Plan / Recommendation Clinical Impression  39 year old right handed male. 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 He presented to [facility] 10/19/2014 with acute onset of right sided weakness, headache and inability to speak. Cranial CT scan negative. MRI of the head showed moderate volume of patchy acute infarct left MCA territory.  Currently on a pured diet with honey thick liquids.  Patient was admitted for comprehensive rehabilitation program on 10/21/2014.  SLP evaluation completed on 10/22/2014: Pt presents with a severe global aphasia.  Pt exhibits difficulty following 1-step commands, answering basic yes/no questions, matching pictures to objects from a field of three and is nonverbal at this time.  Pt also presents with s/s of a verbal and oral apraxia characterized by groping for words as well as inconsistent and irregular attempts to reproduce oral motor movements demonstrated by SLP.  Pt was oriented to person, date, and place with max to total cues and use of visual aid.  Pt demonstrates adequate sustained attention to tasks in a quiet environment; given the extent of pt's neuro deficits, suspect pt to have moderately severe  cognitive deficits as well, although cognition was not formally assessed at this time due to linguistic deficits.   Pt was made NPO for procedure this AM; therefore, no swallowing evaluation was completed at this time.  Per MBS report from [facility], pt presents with a moderate-severe dysphagia with silent aspiration of nectar thick liquids before and during the swallow due to delayed swallow initiation resulting from poor coordination of oral and pharyngeal phase of swallow.  SLP will follow up at next available appointment to complete bedside swallow evaluation. Pt will most likely need an objective swallow study prior to advancement due to silent aspiration of nectar thick liquids on initial study. Given that pt's was working full time, is young, and was highly independent prior to admission, pt would benefit from skilled ST while inpatient in order to maximize functional independence and reduce burden of care prior to discharge. Anticipate that pt will need 24/7 supervision, assistance for medication and financial management, and ST follow up at next level of care.     Skilled Therapeutic Interventions          Cognitive-linguistic evaluation completed with results and recommendations reviewed with patient and family.     SLP Assessment  Patient will need skilled [facility] during CIR admission    Recommendations  Patient destination: Home Follow up Recommendations: Home Health SLP;24 hour supervision/assistance Equipment Recommended: None recommended by SLP    SLP Frequency 3 to 5 out of 7 days   SLP Treatment/Interventions Cognitive remediation/compensation;Cueing hierarchy;Patient/family education;Speech/Language facilitation;Internal/external aids;Environmental controls;Functional tasks;Dysphagia/aspiration precaution training;Multimodal communication approach    Pain Pain Assessment Pain Assessment: No/denies pain Faces  Pain Scale: No hurt Prior  Functioning Cognitive/Linguistic Baseline: Within functional limits Type of Home: House  Lives With: Spouse Available Help at Discharge: Family Vocation: Full time employment  Short Term Goals: Week 1: SLP Short Term Goal 1 (Week 1): Pt will answer basic, immediate, biographical and/or environmental yes/no questions with max assist multimodal cuing.   SLP Short Term Goal 2 (Week 1): Pt will follow 1-step directions in the context of a basic, familiar task with mod-max assist multimodal cuing.  SLP Short Term Goal 3 (Week 1): Pt will initiate vocalizations to produce vowels and/or CV syllables with max assist multimodal cuing.  SLP Short Term Goal 4 (Week 1): Pt will return demonstration of the call bell with max assist multimodal cuing.   See FIM for current functional status Refer to Care Plan for Long Term Goals  Recommendations for other services: None  Discharge Criteria: Patient will be discharged from SLP if patient refuses treatment 3 consecutive times without medical reason, if treatment goals not met, if there is a change in medical status, if patient makes no progress towards goals or if patient is discharged from hospital.  The above assessment, treatment plan, treatment alternatives and goals were discussed and mutually agreed upon: by patient  Emilio Math 10/22/2014, 12:56 PM

## 2014-10-22 NOTE — Progress Notes (Signed)
Meredith Staggers, MD Physician Signed Physical Medicine and Rehabilitation PMR Pre-admission 10/21/2014 10:09 AM  Related encounter: Documentation from 10/20/2014 in [place] Collapse All   [employer] PMR Admission Coordinator Pre-Admission Assessment  Patient: Mario [name] is an 39 y.o., male MRN: [id number] DOB: 08-15-1975 Height: _0  (180.3 cm) Weight: 103.42 kg (228 lb)  Insurance Information HMO: PPO: PCP: IPA: 80/20: OTHER: State Blue Options PRIMARY: [place] Plan Policy#: [id number] Subscriber: self CM Name: Azucena Kuba, RN Phone#: [phone] Fax#: [phone] Approval given on 10-21-14 for 2 weeks and updates due to [facility] at above fax on 6-71-24 Pre-Cert#: [id number] Employer: [employer] [place] Benefits: Phone #: [phone] Name: online Eff. Date: 06-05-14 Deduct: $1054 (none met) Out of Pocket Max: [phone] (none met) Life Max: none CIR: 70/30%, $329 copay per admit, pre-auth needed SNF: 70/30%, 100 days max Outpatient: 70/30% Co-Pay: $72 copay, no visit limits Home Health: 70/30% Co-Pay: none, no visit limits DME: 70/30% Co-Pay: none Providers: in network  Emergency Contact Information Contact Information    Name Relation Home Work Mobile   Alperin,Tangii Spouse   [phone]      Current Medical History  Patient Admitting Diagnosis: L MCA CVA  History of Present Illness: 39 year old right handed male with history of hypertension maintained on hydrochlorothiazide. 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 He presented to [facility] 10/19/2014 with acute onset of right sided weakness, headache and inability to speak. Cranial CT scan negative. MRI of the head showed moderate volume of patchy acute infarct left MCA territory. Most confluent involvement at the  corona radiata and lentiform nuclei. No mass effect. Carotid Dopplers with no ICA stenosis. 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 Collateral supply of left MCA branch vessels decreased in number and caliber in the area of acute infarct. Carotid Dopplers with ejection fraction of 65% without emboli. Patient did not receive TPA. Placed on aspirin for CVA prophylaxis. Subcutaneous Lovenox for DVT prophylaxis. Currently on a pured diet with honey thick liquids. Therapies have been initiated. Requiring min mod assist for basic mobility. Patient was admitted for comprehensive rehabilitation program.  Patient's medical record from [facility] has been reviewed by the rehabilitation admission coordinator and physician.  Past Medical History  No past medical history on file.  Family History  family history is not on file.  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 He enjoys hunting and fishing and is involved in his church.   Home Assistive Devices / Equipment Home Assistive Devices/Equipment: None   Prior Functional Level Current Functional Level  Bed Mobility  Independent  Mod assist   Transfers  Independent  Mod assist (Mod assist x 2 with R LE buckling)   Mobility - Walk/Wheelchair  Independent  Mod assist (Mod assist x 2 to amb. 2' to chair, R LE buckling)   Upper Body Dressing  Independent  Mod assist   Lower Body Dressing  Independent  Other (not assessed, anticipate needs)    Grooming  Independent  Other (not assessed, anticipate needs, Pt is R handed (  flaccid R UE))   Eating/Drinking  Independent  Other (SLP note says difficulty initiating oral motor mvmts)   Toilet Transfer  Independent  Other (not assessed, anticipate needs)   Bladder Continence   WFL  currently using urinal with assist   Bowel Management  WFL  last BM on 5-15 per wife   Stair Climbing  Independent  Other (not assessed, anticipate needs)   Communication  Independent  global aphasia, sometimes nods appropriately to ?s   Memory  WFL  unable to assess   Cooking/Meal Prep  Independent     Housework  Independent    Money Management  Independent    Driving  Independent     Special needs/care consideration BiPAP/CPAP no CPM no  Continuous Drip IV no  Dialysis no  Life Vest no Oxygen no  Special Bed no  Trach Size no Wound Vac (area) no  Skin - no current  Bowel mgmt: last BM on 5-15 per wife Bladder mgmt: currently using urinal with assist Diabetic mgmt no  Previous Home Environment Living Arrangements: Spouse/significant other Lives With: Spouse Available Help at Discharge: Family Type of Home: House Home Layout: One level  Discharge Living Setting Plans for Discharge Living Setting: Patient's home Type of Home at Discharge: House Discharge Home Layout: One level Does the patient have any problems obtaining your medications?: No  Social/Family/Support Systems Patient Roles: Spouse, Other (Comment) (works full time as Architect, enjoys hunting/fishing) Sport and exercise psychologist Information: wife Marylou Flesher is primary contact Anticipated Caregiver: wife and supportive family (pt's parents) Anticipated Caregiver's Contact Information: see above Ability/Limitations of Caregiver: Wife is a Education officer, museum and will be out of school in early June.  She will be avail 24-7 after that. In addition, pt's parents are very supportive. Caregiver Availability: 24/7 Discharge Plan Discussed with Primary Caregiver: Yes Is Caregiver In Agreement with Plan?: Yes Does Caregiver/Family have Issues with Lodging/Transportation while Pt is in Rehab?: No  Goals/Additional Needs Patient/Family Goal for Rehab: Supervision and min assist with PT/OT, min assist with SLP Expected length of stay: 14-18 days Cultural Considerations: pt and his wife are very involved in their [place] Needs: pured diet with honey thick liquids, SLP notes motor apraxia with swallowing. Equipment Needs: to be determined Pt/Family Agrees to Admission and willing to participate: Yes Program Orientation Provided & Reviewed with Pt/Caregiver Including Roles & Responsibilities: Yes  Patient Condition: I received this referral on 10-20-14 and have reviewed pt's case with rehab team. 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 Pt was admitted to [facility] on 10-19-14 after waking up unable to speak and with R UE/LE weakness. Work up revealed L MCA CVA. Pt is currently requiring moderate assist of 2 for limited transfers and limited gait to chair with a flaccid right UE/LE. In addition, pt has global aphasia and is following commands inconsistently. Pt will benefit from further skilled speech services to address pt's communication needs/global aphasia and for diet advancement beyond his current honey puree diet. Also, skilled SLP services will address cognitive issues associated with L MCA CVA. Pt will benefit greatly from the multi-disciplinary team of skilled PT, OT and rehab nursing to maximize his functional return following this new stroke. PT, OT and rehab nursing will focus on increasing strength for greater independence with bed mobility, transfers, gait and self care skills. Rehab  nursing will focus on pt/family education for medication management, stroke education and any  bowel/bladder care needs. Pt will also benefit from rehab physician intervention to monitor his medical management following this new CVA in a patient with no significant past medical history. Discussed case with rehab team and Dr. Naaman Plummer who feel that pt is a good inpatient rehab candidate. We received medical clearance from MD at [facility] Pt and his wife are motivated to come to inpatient rehab and will benefit from the intensive services of skilled therapy under rehab physician guidance. Pt will be admitted today on 10-21-14.  Preadmission Screen Completed By: Nanetta Batty, PT, 10/21/2014 9:49 AM ______________________________________________________________________  Discussed status with Dr. Naaman Plummer on 10-21-14 at 0930 and received telephone approval for admission today.  Admission Coordinator: Nanetta Batty, PT, time 0930/Date 10-21-14   Assessment/Plan: Diagnosis: left MCA infarct 1. Does the need for close, 24 hr/day Medical supervision in concert with the patient's rehab needs make it unreasonable for this patient to be served in a less intensive setting? Yes 2. Co-Morbidities requiring supervision/potential complications: htn, post-stroke sequelae 3. Due to bladder management, bowel management, safety, skin/wound care, disease management, medication administration, pain management and patient education, does the patient require 24 hr/day rehab nursing? Yes 4. Does the patient require coordinated care of a physician, rehab nurse, PT (1-2 hrs/day, 5 days/week), OT (1-2 hrs/day, 5 days/week) and SLP (1-2 hrs/day, 5 days/week) to address physical and functional deficits in the context of the above medical diagnosis(es)? Yes Addressing deficits in the following areas: balance, endurance, locomotion, strength, transferring, bowel/bladder control, bathing, dressing, feeding, grooming,  toileting, cognition, speech, language, swallowing and psychosocial support 5. Can the patient actively participate in an intensive therapy program of at least 3 hrs of therapy 5 days a week? Yes 6. The potential for patient to make measurable gains while on inpatient rehab is excellent 7. Anticipated functional outcomes upon discharge from inpatients are: supervision and min assist PT, supervision and min assist OT, min assist SLP 8. Estimated rehab length of stay to reach the above functional goals is: 14-18 days 9. Does the patient have adequate social supports to accommodate these discharge functional goals? Yes 10. Anticipated D/C setting: Home 11. Anticipated post D/C treatments: HH therapy and Outpatient therapy 12. Overall Rehab/Functional Prognosis: excellent    RECOMMENDATIONS: This patient's condition is appropriate for continued rehabilitative care in the following setting: CIR Patient has agreed to participate in recommended program. Yes Note that insurance prior authorization may be required for reimbursement for recommended care.  Comment: admit to inpatient rehab today  Meredith Staggers, MD, [place] 10/21/2014   Nanetta Batty, PT 10/21/2014

## 2014-10-22 NOTE — Progress Notes (Signed)
Patient information reviewed and entered into eRehab system by Jameel Quant, RN, CRRN, PPS Coordinator.  Information including medical coding and functional independence measure will be reviewed and updated through discharge.    

## 2014-10-22 NOTE — Evaluation (Signed)
Physical Therapy Assessment and Plan  Patient Details  Name: Mario [name] MRN: [id number] Date of Birth: December 13, 1975  PT Diagnosis: Abnormal posture, Abnormality of gait, Cognitive deficits, Coordination disorder, Difficulty walking, Hemiplegia dominant, Hypotonia, Impaired cognition and Impaired sensation Rehab Potential: Good ELOS: 25-28 days    Today's Date: 10/22/2014 PT Individual Time: 0830-0930 PT Individual Time Calculation (min): 60 min    Problem List:  Patient Active Problem List   Diagnosis Date Noted  . 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 Right hemiparesis 10/21/2014  . 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 Hypertension    Past Surgical History:  Past Surgical History  Procedure Laterality Date  . No past surgeries      Assessment & Plan Clinical Impression: Patient is a 39 y.o. year old male with history of hypertension maintained on hydrochlorothiazide. 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 Cranial CT scan negative. MRI of the head showed moderate volume of patchy acute infarct left MCA territory. Most confluent involvement at the corona radiata and lentiform nuclei. No mass effect. Carotid Dopplers with no ICA stenosis. 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 Collateral supply of left MCA branch vessels decreased in number and caliber in the area of acute infarct. Carotid Dopplers with ejection fraction of 65% without emboli. Patient did not receive TPA. Placed on aspirin for CVA prophylaxis. Subcutaneous Lovenox for DVT prophylaxis. Currently on a pured diet with honey thick liquids..  Patient transferred to CIR on 10/21/2014 .   Patient currently requires max to +2A with mobility secondary to muscle weakness, impaired timing and  sequencing, abnormal tone, unbalanced muscle activation, motor apraxia and decreased motor planning, decreased visual perceptual skills, decreased midline orientation and decreased attention to right and decreased attention, decreased awareness, decreased problem solving, decreased safety awareness and decreased memory.  Prior to hospitalization, patient was independent  with mobility and lived with Spouse in a House home.  Home access is 5-7Stairs to enter.  Patient will benefit from skilled PT intervention to maximize safe functional mobility, minimize fall risk and decrease caregiver burden for planned discharge home with 24 hour assist.  Anticipate patient will benefit from follow up [facility] at discharge.  PT - End of Session Activity Tolerance: Tolerates 30+ min activity with multiple rests Endurance Deficit: Yes PT Assessment Rehab Potential (ACUTE/IP ONLY): Good PT Patient demonstrates impairments in the following area(s): Balance;Endurance;Motor;Perception;Safety;Sensory PT Transfers Functional Problem(s): Bed Mobility;Bed to Chair;Car;Furniture PT Locomotion Functional Problem(s): Ambulation;Wheelchair Mobility;Stairs PT Plan PT Intensity: Minimum of 1-2 x/day ,45 to 90 minutes PT Frequency: 5 out of 7 days PT Duration Estimated Length of Stay: 25-28 days  PT Treatment/Interventions: Ambulation/gait training;Balance/vestibular training;Cognitive remediation/compensation;Discharge planning;Disease management/prevention;DME/adaptive equipment instruction;Functional electrical stimulation;Functional mobility training;Neuromuscular re-education;Patient/family education;Psychosocial support;Splinting/orthotics;Stair training;Therapeutic Activities;Therapeutic Exercise;UE/LE Strength taining/ROM;UE/LE Coordination activities;Visual/perceptual remediation/compensation;Wheelchair propulsion/positioning PT Transfers Anticipated Outcome(s): S PT Locomotion Anticipated Outcome(s): S w/c level and gait  with therapy only  PT Recommendation Follow Up Recommendations: Home health PT;24 hour supervision/assistance Patient destination: Home Equipment Recommended: To be determined  Skilled Therapeutic Intervention PT evaluation and assessment completed, see full details below.  Initiated gait training with use of L rail in hallway at max to total A level with +2 A for chair follow.  Pt with increased difficulty following verbal and demonstration cues  during session, requiring constant tactile cues for safety.  Also initiated w/c propulsion and standing with use of mirror for increased visual feedback.  Discussed ELOS, expected outcomes, and rehab schedule with pt and wife.  Wife verbalized understanding.   PT Evaluation Precautions/Restrictions Precautions Precautions: Fall Precaution Comments: global aphasia, aspiration risk, dense R hemiplegia Restrictions Weight Bearing Restrictions: No General Chart Reviewed: Yes Family/Caregiver Present: Yes (wife) Vital Signs  Pain Pain Assessment Pain Assessment: No/denies pain Faces Pain Scale: No hurt Home Living/Prior Functioning Home Living Available Help at Discharge: Family;Available 24 hours/day (available 6/9 24/7 following school getting out) Type of Home: House Home Access: Stairs to enter [employer] of Steps: 5-7 Entrance Stairs-Rails: Right;Left Home Layout: Two level;Able to live on main level with bedroom/bathroom  Lives With: Spouse Prior Function Level of Independence: Independent with basic ADLs;Independent with gait;Independent with transfers  Able to Take Stairs?: Yes Driving: Yes Vocation: Full time employment Leisure: Hobbies-yes (Comment) (pt enjoys hunting, fishing, golfing) Comments: 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 Vision/Perception     Cognition Overall Cognitive Status: Impaired/Different from baseline Arousal/Alertness: Awake/alert Orientation Level:   (difficult to assess due to aphasia) Attention: Sustained;Selective Focused Attention: Appears intact Sustained Attention: Appears intact Selective Attention: Impaired Selective Attention Impairment: Functional basic;Verbal basic Memory: Impaired Memory Impairment: Decreased recall of new information Awareness: Impaired Awareness Impairment: Intellectual impairment Problem Solving: Impaired Problem Solving Impairment: Functional basic;Verbal basic Executive Function: Sequencing;Initiating Sequencing: Impaired Sequencing Impairment: Functional basic Initiating: Impaired Initiating Impairment: Functional basic Behaviors: Perseveration Safety/Judgment: Impaired Comments: Pt very impulsive during PT therapy session Sensation Sensation Light Touch: Impaired by gross assessment Stereognosis: Not tested Hot/Cold: Not tested Proprioception: Impaired by gross assessment Additional Comments: Unable to formally test due to aphasia/cognitive deficits, however did respond to painful stimulus in RUE.  Coordination Gross Motor Movements are Fluid and Coordinated: No Fine Motor Movements are Fluid and Coordinated: No Coordination and Movement Description: Pt with decreased coordination and strength in RUE/LE Heel Shin Test: unable to perform due to weakness in RLE Motor  Motor Motor: Hemiplegia;Abnormal postural alignment and control;Motor apraxia Motor - Skilled Clinical Observations: dense R hemiplegia, decreased balance, apraxia  Mobility Bed Mobility Bed Mobility: Supine to Sit Supine to Sit: 3: Mod assist Supine to Sit Details: Verbal cues for sequencing;Verbal cues for technique;Verbal cues for precautions/safety;Manual facilitation for weight shifting;Manual facilitation for placement Transfers Transfers: Yes Sit to Stand: 3: Mod assist Sit to Stand Details: Verbal cues for sequencing;Verbal cues for technique;Verbal cues for precautions/safety;Manual facilitation for weight  shifting;Manual facilitation for weight bearing;Manual facilitation for placement Stand to Sit: 3: Mod assist Stand to Sit Details (indicate cue type and reason): Verbal cues for sequencing;Verbal cues for technique;Verbal cues for precautions/safety;Manual facilitation for weight shifting;Manual facilitation for placement Stand Pivot Transfers: 2: Max assist Stand Pivot Transfer Details: Verbal cues for sequencing;Verbal cues for technique;Verbal cues for precautions/safety;Manual facilitation for weight shifting;Manual facilitation for weight bearing;Manual facilitation for placement Squat Pivot Transfers: 3: Mod assist;2: Max Risk manager Details: Verbal cues for sequencing;Verbal cues for technique;Verbal cues for precautions/safety;Manual facilitation for weight shifting;Manual facilitation for placement;Manual facilitation for weight bearing Locomotion  Ambulation Ambulation/Gait Assistance: 1: +1 Total assist;1: +2 Total assist Gait Gait: Yes Gait Pattern: Impaired Gait Pattern: Step-to pattern;Decreased step length - right;Decreased step length - left;Decreased stride length;Lateral trunk lean to right;Trunk flexed;Poor foot clearance - right;Right flexed knee in stance Stairs / Additional Locomotion Stairs: Yes Stairs Assistance: 1: +2 Total assist  Stairs Assistance Details: Verbal cues for sequencing;Verbal cues for technique;Verbal cues for precautions/safety;Manual facilitation for weight shifting;Manual facilitation for weight bearing;Verbal cues for gait pattern;Manual facilitation for placement Stair Management Technique: One rail Left;Step to pattern;Forwards Number of Stairs: 4 Height of Stairs: 6 Wheelchair Mobility Wheelchair Mobility: Yes Wheelchair Assistance: 3: Building surveyor Details: Tactile cues for initiation;Verbal cues for sequencing;Verbal cues for technique;Verbal cues for precautions/safety;Tactile cues for sequencing Wheelchair  Propulsion: Left upper extremity;Left lower extremity Wheelchair Parts Management: Needs assistance Distance: 100  Trunk/Postural Assessment  Cervical Assessment Cervical Assessment: Within Functional Limits Thoracic Assessment Thoracic Assessment: Within Functional Limits Lumbar Assessment Lumbar Assessment: Within Functional Limits Postural Control Postural Control: Deficits on evaluation Protective Responses: Pt with increased R lateral lean in sitting and standing, slight pusher tendencies with little ability to correct Postural Limitations: Pt with R lateral lean in sitting and standing.   Balance Balance Balance Assessed: Yes Static Sitting Balance Static Sitting - Balance Support: Feet supported;Right upper extremity supported Static Sitting - Level of Assistance: 4: Min assist Dynamic Sitting Balance Dynamic Sitting - Balance Support: During functional activity;Feet supported;Right upper extremity supported Dynamic Sitting - Level of Assistance: 4: Min assist;3: Mod assist Static Standing Balance Static Standing - Balance Support: During functional activity Static Standing - Level of Assistance: 3: Mod assist;2: Max assist Dynamic Standing Balance Dynamic Standing - Balance Support: During functional activity Dynamic Standing - Level of Assistance: 2: Max assist;1: +1 Total assist Extremity Assessment      RLE Assessment RLE Assessment: Exceptions to [facility] RLE Strength RLE Overall Strength: Deficits RLE Overall Strength Comments: Note some quad activation in RLE during standing, otherwise no active movement noted.  LLE Assessment LLE Assessment: Within Functional Limits  FIM:  FIM - Bed/Chair Transfer Bed/Chair Transfer Assistive Devices: Arm rests Bed/Chair Transfer: 3: Supine > Sit: Mod A (lifting assist/Pt. 50-74%/lift 2 legs;2: Bed > Chair or W/C: Max A (lift and lower assist);2: Chair or W/C > Bed: Max A (lift and lower assist) FIM - Locomotion:  Wheelchair Distance: 100 Locomotion: Wheelchair: 2: Travels 50 - 149 ft with moderate assistance (Pt: 50 - 74%) FIM - Locomotion: Ambulation Locomotion: Ambulation Assistive Devices: Other (comment) (L handrail) Ambulation/Gait Assistance: 1: +1 Total assist;1: +2 Total assist Locomotion: Ambulation: 1: Two helpers FIM - Locomotion: Stairs Locomotion: Scientist, physiological: Insurance account manager - 1 Locomotion: Stairs: 1: Two helpers   Refer to [employer] for [place]  Recommendations for other services: None  Discharge Criteria: Patient will be discharged from PT if patient refuses treatment 3 consecutive times without medical reason, if treatment goals not met, if there is a change in medical status, if patient makes no progress towards goals or if patient is discharged from hospital.  The above assessment, treatment plan, treatment alternatives and goals were discussed and mutually agreed upon: by patient and by family  Denice Bors 10/22/2014, 1:05 PM

## 2014-10-22 NOTE — Progress Notes (Signed)
Occupational Therapy Session Note  Patient Details  Name: Mario [name] MRN: [id number] Date of Birth: [birth year]  Today's Date: 10/22/2014 OT Individual Time: 1400-1430 OT Individual Time Calculation (min): 30 min    Short Term Goals: Week 1:     Skilled Therapeutic Interventions/Progress Updates: Therapeutic activity with focus on improved attention and awareness, NMR of RUE, sit<>stand, and transfers (w/c<>BSC, w/c<>tub bench).   With family members and pastor out of room pt demo'd excellent attention to directions but required hand-over-hand guidance to perform SROM at RUE to improve integration of sensory and motor function while sitting at w/c with armrest flipped.   No lateral leaning noted during 5 exercises, 10 reps each (written HEP placed in Patient Handbook).   Pt was then positioned at sink and performed sit<>stand with mod assist to stabilize RLE d/t right knee buckling.  Once standing, pt maintained standing balance using LUE supported at sink with min assist.   Pt returned to w/c and [facility] toilet from w/c and to tub bench with overall mod assist to lift and stabilize RLE.    Pt educated on hooking left foot under right foot during assisted w/c mobility and on need to attend to right UE and LE during functional tasks.   Pt was non-verbal throughout session although sustaining good eye-contact.   Family returned to room at end of session.   [name] was educated on use of Handbook and on pt's performance with selected tasks.     Therapy Documentation Precautions:  Precautions Precautions: Fall Precaution Comments: global aphasia, aspiration risk, dense R hemiplegia Restrictions Weight Bearing Restrictions: No   Pain: Pain Assessment Pain Assessment: No/denies pain Faces Pain Scale: No hurt  See FIM for current functional status  Therapy/Group: Individual Therapy  Gisell Buehrle 10/22/2014, 2:48 PM

## 2014-10-22 NOTE — Progress Notes (Signed)
Orthopedic Tech Progress Note Patient Details:  Mellody DrownJerome A Sawin Jan 22, 1976 [id number]  Patient ID: Mellody DrownJerome A Arkin, male   DOB: Jan 22, 1976, 39 y.o.   MRN: [id number] Called  in advanced brace order; spoke with Ferdinand LangoKanisha  Wyatt Thorstenson 10/22/2014, 9:26 AM

## 2014-10-22 NOTE — Progress Notes (Signed)
Social Work Assessment and Plan Social Work Assessment and Plan  Patient Details  Name: Mario [name] MRN: [id number] Date of Birth: April 01, 1976  Today's Date: 10/22/2014  Problem List:  Patient Active Problem List   Diagnosis Date Noted  . 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 Right hemiparesis 10/21/2014  . 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 Hypertension    Past Surgical History:  Past Surgical History  Procedure Laterality Date  . No past surgeries     Social History:  reports that he has never smoked. He does not have any smokeless tobacco history on file. He reports that he does not drink alcohol or use illicit drugs.  Family / Support Systems Marital Status: Married Patient Roles: Spouse, Other (Comment) (employee) Spouse/Significant Other: Tangi [phone] Other Supports: Pt's parents-dad and step-mom and his Mom here daily Anticipated Caregiver: Wife and pt's parents Ability/Limitations of Caregiver: Wife is out of school 6/9.  Pt's Dad is retired also Medical laboratory scientific officerCaregiver Availability: 24/7 Family Dynamics: Close with his parents and step-mom.  All are local and very involved and supportive.  Always someone here with him.  Very supportive church members and friends will all hlpe out at discharge.  Social History Preferred language: English Religion: Christian Cultural Background: No issues Education: Automotive engineerCollege Educated Read: Yes Write: Yes Employment Status: Employed Name of Employer: [employer] Return to Work Plans: Unsure at this time dependent upon his recovery Fish farm managerLegal Hisotry/Current Legal Issues: No issues Guardian/Conservator: none-according to MD pt is not at this time capable of making his own decisions while here, will look toward his wife if any decisions need to be made here   Abuse/Neglect Physical Abuse: Denies Verbal Abuse: Denies Sexual Abuse: Denies Exploitation of  patient/patient's resources: Denies Self-Neglect: Denies  Emotional Status Pt's affect, behavior adn adjustment status: Pt is not able to verbally participate in discussion due to his global aphasia.  Obtained information from his wife.  She reports he is very active and always one to not sit still.  He needed to slow down.  She feels he will give it his all and do whatever he needs to do to recover from this stroke. Recent Psychosocial Issues: HTN was being treated for this, otherwise healthy Pyschiatric History: No history unable to screen pt due to aphasia will continue to follow and screen when appropriate able.  Due to young age would benefit from Neuro-psych involvement when appropriate Substance Abuse History: No issues  Patient / Family Perceptions, Expectations & Goals Pt/Family understanding of illness & functional limitations: Wife is able to explain his stroke and deficits, she has spoken with the MD and feels her questions and concerns are being addressed.  She plans to be very involved here and participate in husband's care. Premorbid pt/family roles/activities: Husband, Employee, son, church member, home owner, etc Anticipated changes in roles/activities/participation: resume Pt/family expectations/goals: Wife states: " I hope he does well and can regain as much function as possible before he leaves here."  Pt does make eye contact and appears to be listening to the conversation going on around him.  Community Resources Levi StraussCommunity Agencies: None Premorbid Home Care/DME Agencies: None Transportation available at discharge: [employer]Family Resource referrals recommended: Neuropsychology, Support group (specify)  Discharge Planning Living Arrangements: Spouse/significant other Support Systems: Spouse/significant other, Parent, Manufacturing engineerriends/neighbors, Psychologist, clinicalChurch/faith community Type of Residence: Private residence Civil engineer, contractingnsurance Resources: Media plannerrivate Insurance (specify) Chief Executive Officer(State BCBS) Financial Resources:  Employment Financial Screen Referred:  No Living Expenses: Lives with family Money Management: Spouse, Patient Does the patient have any problems obtaining your medications?: No Home Management: Both pt and wife Patient/Family Preliminary Plans: Return home with wife and parents' to be assisting.  Wife will be done with school early June and will be available all summer.  Pt's parent's and church members will also be assisting with his care.  Wife plans to always have someone be here with him, doesn;t want him being alone. Social Work Anticipated Follow Up Needs: HH/OP, Support Group  Clinical Impression Pt made eye contact and seemed to be listening to the conversation going on around him.  Unable to assess him due to his global aphasia from his stroke.  His wife is very supportive and plans to be here often-works M-W, but  Will be here all day Th and Fri.  Pt's dad to be here the other times. Wife has a strong faith and is relying upon this to pull her and pt through. She believes miracles happen everyday.  Wife to being in paperwork form pt's job To be completed and will work on discharge plans. Pt will benefit from Neuro-psych when appropriate.  Lucy Chrisupree, Toneka Fullen G 10/22/2014, 1:23 PM

## 2014-10-22 NOTE — Progress Notes (Signed)
39 year old right handed male with history of hypertension maintained on hydrochlorothiazide. 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 He presented to [facility] 10/19/2014 with acute onset of right sided weakness, headache and inability to speak. Cranial CT scan negative. MRI of the head showed moderate volume of patchy acute infarct left MCA territory. Most confluent involvement at the corona radiata and lentiform nuclei. No mass effect. Carotid Dopplers with no ICA stenosis. 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 Collateral supply of left MCA branch vessels decreased in number and caliber in the area of acute infarct. Carotid Dopplers with ejection fraction of 65% without emboli Subjective/Complaints: NPO after breakfast for angiogram Pt awake in bed, no discomfort noted Wife in room  Review of Systems - unable to obtain secondary to severe aphasia  Objective: Vital Signs: Blood pressure 137/88, pulse 73, temperature 98.2 F (36.8 C), temperature source Oral, resp. rate 18, height 6' (1.829 m), weight 102.9 kg (226 lb 13.7 oz), SpO2 96 %. No results found. Results for orders placed or performed during the hospital encounter of 10/21/14 (from the past 72 hour(s))  CBC     Status: None   Collection Time: 10/21/14  4:33 PM  Result Value Ref Range   WBC 7.0 4.0 - 10.5 K/uL   RBC 4.52 4.22 - 5.81 MIL/uL   Hemoglobin 13.7 13.0 - 17.0 g/dL   HCT 41.4 39.0 - 52.0 %   MCV 91.6 78.0 - 100.0 fL   MCH 30.3 26.0 - 34.0 pg   MCHC 33.1 30.0 - 36.0 g/dL   RDW 13.7 11.5 - 15.5 %   Platelets 212 150 - 400 K/uL  Creatinine, serum     Status: Abnormal   Collection Time: 10/21/14  4:33 PM  Result Value Ref Range   Creatinine, Ser 1.36 (H) 0.61 - 1.24 mg/dL   GFR calc non Af Amer >60 >60 mL/min   GFR calc Af Amer >60 >60 mL/min    Comment: (NOTE) The eGFR has been calculated using the CKD EPI equation. This calculation has not been validated in all  clinical situations. eGFR's persistently <60 mL/min signify possible Chronic Kidney Disease.   CBC WITH DIFFERENTIAL     Status: None   Collection Time: 10/22/14  6:00 AM  Result Value Ref Range   WBC 5.2 4.0 - 10.5 K/uL   RBC 4.39 4.22 - 5.81 MIL/uL   Hemoglobin 13.5 13.0 - 17.0 g/dL   HCT 40.4 39.0 - 52.0 %   MCV 92.0 78.0 - 100.0 fL   MCH 30.8 26.0 - 34.0 pg   MCHC 33.4 30.0 - 36.0 g/dL   RDW 13.8 11.5 - 15.5 %   Platelets 199 150 - 400 K/uL   Neutrophils Relative % 62 43 - 77 %   Neutro Abs 3.3 1.7 - 7.7 K/uL   Lymphocytes Relative 29 12 - 46 %   Lymphs Abs 1.5 0.7 - 4.0 K/uL   Monocytes Relative 7 3 - 12 %   Monocytes Absolute 0.4 0.1 - 1.0 K/uL   Eosinophils Relative 2 0 - 5 %   Eosinophils Absolute 0.1 0.0 - 0.7 K/uL   Basophils Relative 0 0 - 1 %   Basophils Absolute 0.0 0.0 - 0.1 K/uL     HEENT: normal Cardio: RRR and no murmur Resp: CTA B/L and unlabored GI: BS positive and NT.ND Extremity:  Edema Mild Right Hand Skin:   Intact Neuro: Alert/Oriented, Abnormal Sensory Reduced sensation right  lower extremity greater than right upper extremity, Abnormal Motor 0/5 on muscle testing in the right upper and right lower extremity,  slight extension to pinch in the right upper, Aphasic and Other difficult to evaluate visual fields secondary to severe aphasia, may have some right inattention Musc/Skel:  Other no pain with upper extremity or lower extremity active and passive range of motion Gen. no acute distress   Assessment/Plan: 1. 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 Physiatrist is providing close team supervision and 24 hour management of active medical problems listed below. Physiatrist and rehab team continue to assess barriers to discharge/monitor patient progress toward functional and medical  goals. Discussed anticipated long course of inpatient as well as outpatient rehabilitation. Wife is very optimistic, states she seen multiple miracles. Will initiate rehabilitation program FIM:                   Comprehension Comprehension Mode: Auditory Comprehension: 1-Understands basic less than 25% of the time/requires cueing 75% of the time  Expression Expression Mode: Nonverbal Expression: 1-Expresses basis less than 25% of the time/requires cueing greater than 75% of the time.  Social Interaction Social Interaction: 2-Interacts appropriately 25 - 49% of time - Needs frequent redirection.  Problem Solving Problem Solving: 1-Solves basic less than 25% of the time - needs direction nearly all the time or does not effectively solve problems and may need a restraint for safety  Memory Memory: 1-Recognizes or recalls less than 25% of the time/requires cueing greater than 75% of the time  Medical Problem List and Plan: 1. 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 DVT Prophylaxis/Anticoagulation: Subcutaneous Lovenox. Monitor platelet counts and any signs of bleeding 3. Pain Management: Tylenol as needed 4. Dysphagia. Dysphagia 1 honey liquids. Monitor hydration, encourage po 5. Neuropsych: This patient is capable of making decisions on his own behalf. 6. Skin/Wound Care: Routine skin checks 7. Fluids/Electrolytes/Nutrition: Routine eye and nose with follow-up chemistries 8. Hypertension. No current antihypertensive medication. Patient on hydrochlorothiazide 12.5 mg daily prior to admission. Monitor with increased mobility  LOS (Days) 1 A FACE TO FACE EVALUATION WAS PERFORMED  KIRSTEINS,ANDREW E 10/22/2014, 7:48 AM

## 2014-10-22 NOTE — Progress Notes (Signed)
Occupational Therapy Assessment and Plan  Patient Details  Name: Mario [name] MRN: [id number] Date of Birth: 07-21-1975  OT Diagnosis: ataxia, cognitive deficits, hemiplegia affecting dominant side, muscle weakness (generalized) , coordination disorder, impaired sensation Rehab Potential: Rehab Potential (ACUTE ONLY): Good (for stated goals) ELOS: 25-28 days   Today's Date: 10/22/2014 OT Individual Time: 1100-1200 OT Individual Time Calculation (min): 60 min     Problem List:  Patient Active Problem List   Diagnosis Date Noted  . 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 Right hemiparesis 10/21/2014  . 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 Hypertension    Past Surgical History:  Past Surgical History  Procedure Laterality Date  . No past surgeries      Assessment & Plan Clinical Impression: Patient is a 39 y.o. year old male handed male with history of hypertension maintained on hydrochlorothiazide. 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 He presented to [facility] 10/19/2014 with acute onset of right sided weakness, headache and inability to speak. Cranial CT scan negative. MRI of the head showed moderate volume of patchy acute infarct left MCA territory. Most confluent involvement at the corona radiata and lentiform nuclei. No mass effect. Carotid Dopplers with no ICA stenosis. 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 Collateral supply of left MCA branch vessels decreased in number and caliber in the area of acute infarct. Carotid Dopplers with ejection fraction of 65% without emboli. Patient did not receive TPA. Placed on aspirin for CVA prophylaxis. Subcutaneous Lovenox for DVT prophylaxis. Currently on a pured diet with honey thick liquids. Therapies have been initiated. Requiring min mod assist for basic mobility. Patient was admitted for comprehensive  rehabilitation program.Patient transferred to CIR on 10/21/2014 .    Patient currently requires total with basic self-care skills secondary to muscle weakness, decreased cardiorespiratoy endurance, abnormal tone, ataxia, decreased coordination and decreased motor planning, decreased initiation, decreased attention, decreased awareness, decreased problem solving, decreased safety awareness and delayed processing and decreased sitting balance, decreased standing balance, decreased postural control, hemiplegia and decreased balance strategies.  Prior to hospitalization, patient could complete ADLs and IADLs  with independent .  Patient will benefit from skilled intervention to decrease level of assist with basic self-care skills prior to discharge home with care partner.  Anticipate patient will require 24 hour supervision and minimal physical assistance and follow up home health.  OT - End of Session Activity Tolerance: Decreased this session Endurance Deficit: Yes OT Assessment Rehab Potential (ACUTE ONLY): Good (for stated goals) Barriers to Discharge:  (none noted) OT Patient demonstrates impairments in the following area(s): Balance;Sensory;Behavior;Cognition;Vision;Edema;Endurance;Motor;Perception;Safety OT Basic ADL's Functional Problem(s): Grooming;Bathing;Dressing;Toileting OT Transfers Functional Problem(s): Toilet;Tub/Shower OT Additional Impairment(s): Fuctional Use of Upper Extremity OT Plan OT Intensity: Minimum of 1-2 x/day, 45 to 90 minutes OT Frequency: 5 out of 7 days OT Duration/Estimated Length of Stay: 25-28 days OT Treatment/Interventions: Balance/vestibular training;Discharge planning;Functional electrical stimulation;Self Care/advanced ADL retraining;Therapeutic Activities;UE/LE Coordination activities;Cognitive remediation/compensation;Functional mobility training;Patient/family education;Therapeutic Exercise;Visual/perceptual remediation/compensation;Community  reintegration;DME/adaptive equipment instruction;Neuromuscular re-education;Psychosocial support;UE/LE Strength taining/ROM;Wheelchair propulsion/positioning OT Self Feeding Anticipated Outcome(s): set up A OT Basic Self-Care Anticipated Outcome(s): Supervision - Min A OT Toileting Anticipated Outcome(s): Supervision OT Bathroom Transfers Anticipated Outcome(s): supervision - toilet and Min A shower   Skilled Therapeutic Intervention Upon entering the room, pt seated in wheelchair with wife present in room. Pt with no c/o, signs, or symptoms of  pain this session. OT educated pt and caregiver on OT purpose, POC, and goals with caregiver verbalizing understanding and pt shaking head "yes". OT providing simple verbal cues and gestures during session for communication. Pt performed Max A stand pivot transfer wheelchair <> TTB in walk in shower with use of grab bars. Pt with slight pushing tendencies noted during standing and transfers and increased with fatigue. Pt bathing at shower level while seated on TTB and hand over hand assist to incorporate R UE into functional tasks. Pt later attempting to wrap towel around R hand as an attempt to dry body with hand over hand movements. Dressing from wheelchair at sink side with max A standing balance and use of mirror for feedback to pt. His wife asking if he could shave face with electric razor. Razor handed to patient and before turning on he attempted to place into his mouth. Therapist recommended he not engage in that activity without assistance. Pt seated in wheelchair with call bell and all other needed items within reach upon exiting the room. Wife remains present and arm tray placed on wheelchair with QRB donned.   OT Evaluation Precautions/Restrictions  Precautions Precautions: Fall Precaution Comments: global aphasia, aspiration risk, dense R hemiplegia Restrictions Weight Bearing Restrictions: No Pain Pain Assessment Pain Assessment: No/denies  pain Faces Pain Scale: No hurt Home Living/Prior Functioning Home Living Living Arrangements: Spouse/significant other Available Help at Discharge: Family, Available 24 hours/day (available 6/9 24/7 following school getting out) Type of Home: House Home Access: Stairs to enter [employer] of Steps: 5-7 Entrance Stairs-Rails: Right, Left Home Layout: Two level, Able to live on main level with bedroom/bathroom  Lives With: Spouse Prior Function Level of Independence: Independent with basic ADLs, Independent with gait, Independent with transfers  Able to Take Stairs?: Yes Driving: Yes Vocation: Full time employment Leisure: Hobbies-yes (Comment) (pt enjoys hunting, fishing, golfing) Comments: 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 Vision/Perception  Vision- History Baseline Vision/History: Wears glasses Wears Glasses: At all times Patient Visual Report:  (difficult to assess secondary to global aphasia) Vision- Assessment Vision Assessment?: Vision impaired- to be further tested in functional context Additional Comments: R inattention  Cognition Overall Cognitive Status: Impaired/Different from baseline Arousal/Alertness: Awake/alert Orientation Level:  (difficult to assess secondary to global aphasia) Attention: Sustained;Selective Focused Attention: Appears intact Sustained Attention: Appears intact Selective Attention: Impaired Selective Attention Impairment: Functional basic;Verbal basic Memory: Impaired Memory Impairment: Decreased recall of new information Awareness: Impaired Awareness Impairment: Intellectual impairment Problem Solving: Impaired Problem Solving Impairment: Functional basic;Verbal basic Executive Function: Sequencing;Initiating Sequencing: Impaired Sequencing Impairment: Functional basic Initiating: Impaired Initiating Impairment: Functional basic Behaviors: Perseveration Safety/Judgment:  Impaired Comments: impulsive during OT session Sensation Sensation Light Touch: Impaired by gross assessment Stereognosis: Not tested Hot/Cold: Not tested Proprioception: Impaired by gross assessment Additional Comments: Unable to formally test due to aphasia/cognitive deficits, however did respond to painful stimulus in RUE.  Coordination Gross Motor Movements are Fluid and Coordinated: No Fine Motor Movements are Fluid and Coordinated: No Coordination and Movement Description: Pt with decreased coordination and strength in RUE/LE Heel Shin Test: unable to perform due to weakness in RLE Motor  Motor Motor: Hemiplegia;Abnormal postural alignment and control;Motor apraxia Motor - Skilled Clinical Observations: dense R hemiplegia, decreased balance, apraxia Mobility  Bed Mobility Bed Mobility: Supine to Sit Supine to Sit: 3: Mod assist Supine to Sit Details: Verbal cues for sequencing;Verbal cues for technique;Verbal cues for precautions/safety;Manual facilitation for weight shifting;Manual facilitation for  placement Transfers Sit to Stand: 3: Mod assist Sit to Stand Details: Verbal cues for sequencing;Verbal cues for technique;Verbal cues for precautions/safety;Manual facilitation for weight shifting;Manual facilitation for weight bearing;Manual facilitation for placement Stand to Sit: 3: Mod assist Stand to Sit Details (indicate cue type and reason): Verbal cues for sequencing;Verbal cues for technique;Verbal cues for precautions/safety;Manual facilitation for weight shifting;Manual facilitation for placement  Trunk/Postural Assessment  Cervical Assessment Cervical Assessment: Within Functional Limits Thoracic Assessment Thoracic Assessment: Within Functional Limits Lumbar Assessment Lumbar Assessment: Within Functional Limits Postural Control Postural Control: Deficits on evaluation  Balance Balance Balance Assessed: Yes Static Sitting Balance Static Sitting - Balance  Support: Feet supported;Right upper extremity supported Static Sitting - Level of Assistance: 4: Min assist Dynamic Sitting Balance Dynamic Sitting - Balance Support: During functional activity;Feet supported;Right upper extremity supported Dynamic Sitting - Level of Assistance: 4: Min assist;3: Mod assist Static Standing Balance Static Standing - Balance Support: During functional activity Static Standing - Level of Assistance: 3: Mod assist;2: Max assist Dynamic Standing Balance Dynamic Standing - Balance Support: During functional activity Dynamic Standing - Level of Assistance: 2: Max assist;1: +1 Total assist Extremity/Trunk Assessment RUE Assessment RUE Assessment: Exceptions to WFL (PROM WFLS. R UE flaccid with 0/5 strength.) LUE Assessment LUE Assessment: Within Functional Limits  FIM:  FIM - Grooming Grooming: 1: Patient completes 0 of 4 or 1 of 5 steps, or requires 2 helpers FIM - Bathing Bathing Steps Patient Completed: Chest;Abdomen;Front perineal area;Right upper leg;Left upper leg Bathing: 3: Mod-Patient completes 5-7 [id number] parts or 50-74% FIM - Upper Body Dressing/Undressing Upper body dressing/undressing steps patient completed: Thread/unthread left sleeve of pullover shirt/dress;Put head through opening of pull over shirt/dress Upper body dressing/undressing: 3: Mod-Patient completed 50-74% of tasks FIM - Lower Body Dressing/Undressing Lower body dressing/undressing steps patient completed: Thread/unthread left pants leg Lower body dressing/undressing: 1: Total-Patient completed less than 25% of tasks FIM - BControl and instrumentation engineerDevices: Arm rests Bed/Chair Transfer: 3: Supine > Sit: Mod A (lifting assist/Pt. 50-74%/lift 2 legs;2: Bed > Chair or W/C: Max A (lift and lower assist);2: Chair or W/C > Bed: Max A (lift and lower assist) FIM - Tub/Shower Transfers Tub/Shower Assistive Devices: Tub transfer bench;Walk in shower;Grab  bars Tub/shower Transfers: 2-Into Tub/Shower: Max A (lift and lower assist);2-Out of Tub/Shower: Max A (Lift and lower assist)   Refer to Care Plan for Long Term Goals  Recommendations for other services: None  Discharge Criteria: Patient will be discharged from OT if patient refuses treatment 3 consecutive times without medical reason, if treatment goals not met, if there is a change in medical status, if patient makes no progress towards goals or if patient is discharged from hospital.  The above assessment, treatment plan, treatment alternatives and goals were discussed and mutually agreed upon: by patient and by family  PPhineas Semen5/19/2016, 2:25 PM

## 2014-10-23 ENCOUNTER — Inpatient Hospital Stay (HOSPITAL_COMMUNITY): Payer: BC Managed Care – PPO | Admitting: Speech Pathology

## 2014-10-23 ENCOUNTER — Inpatient Hospital Stay (HOSPITAL_COMMUNITY): Payer: BC Managed Care – PPO | Admitting: Physical Therapy

## 2014-10-23 ENCOUNTER — Inpatient Hospital Stay (HOSPITAL_COMMUNITY): Payer: BC Managed Care – PPO | Admitting: Occupational Therapy

## 2014-10-23 ENCOUNTER — Inpatient Hospital Stay (HOSPITAL_COMMUNITY): Payer: BC Managed Care – PPO

## 2014-10-23 IMAGING — XA IR VERTEBRAL  SELECTIVE UNILAT RIGHT (MS)
1 series · 13 of 24 positions shown · IV contrast (IODINE)
Comparison: none

CLINICAL DATA: Left cerebral hemispheric stroke. Aphasia.
Right-sided weakness.

[Series 300: neuro · 13 of 165 slices shown]
[im 1/165]
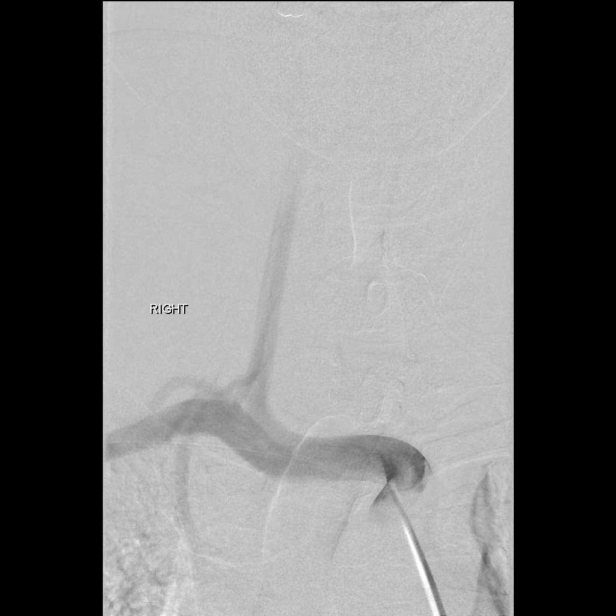
[im 15/165]
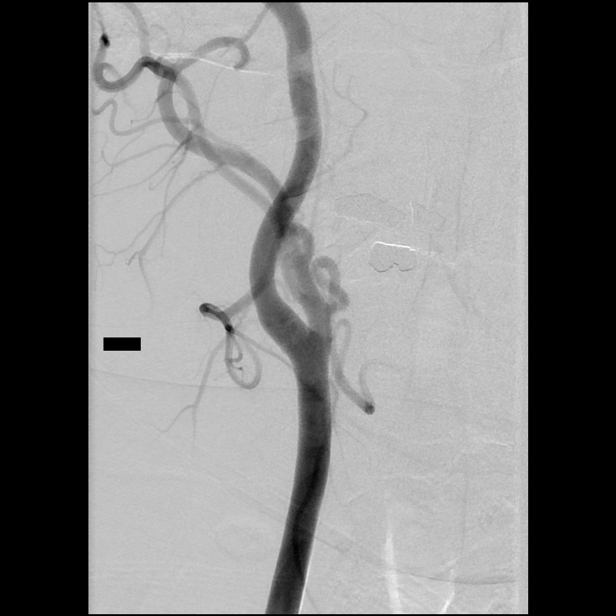
[im 29/165]
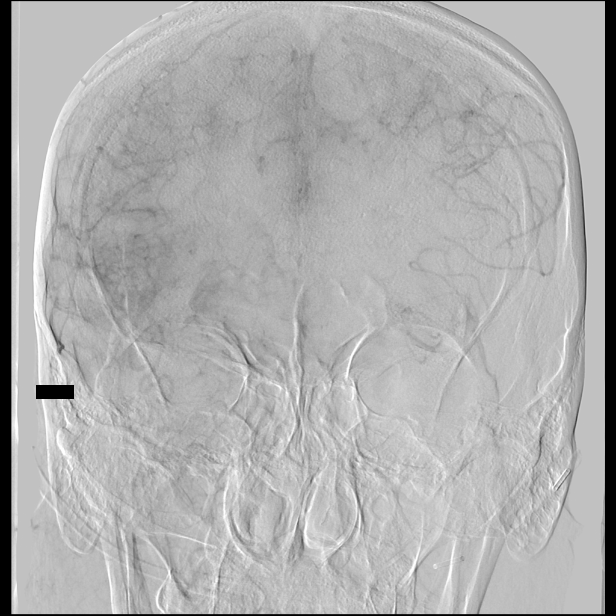
[im 43/165]
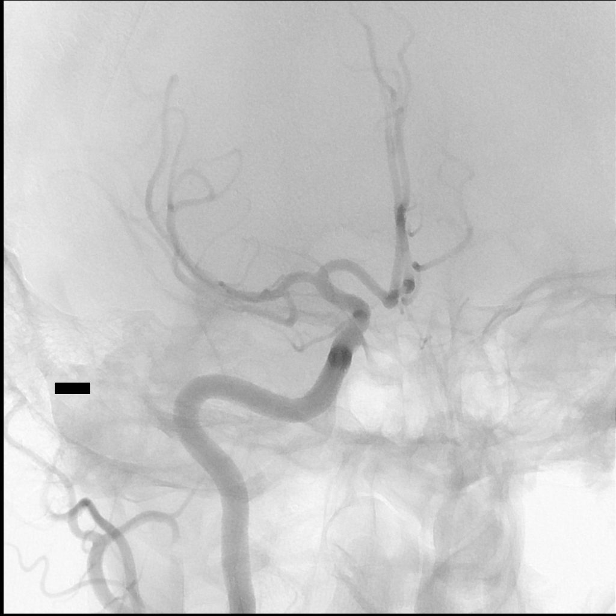
[im 58/165]
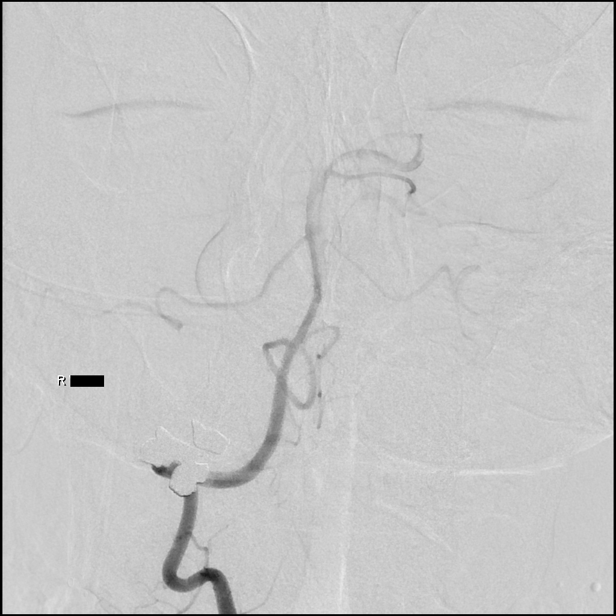
[im 72/165]
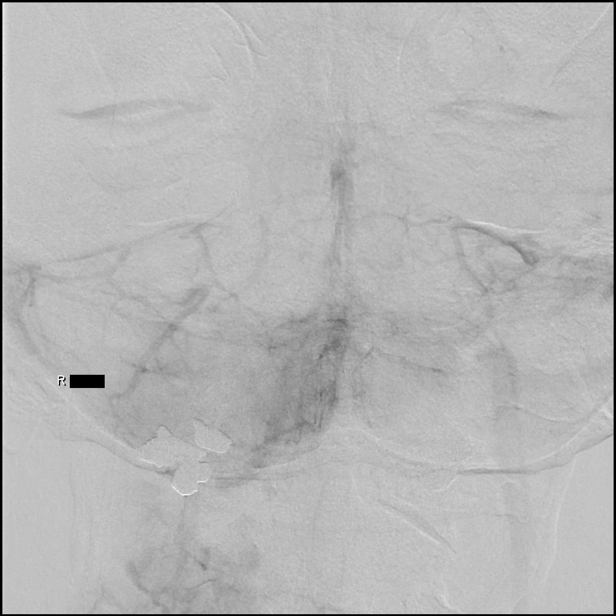
[im 86/165]
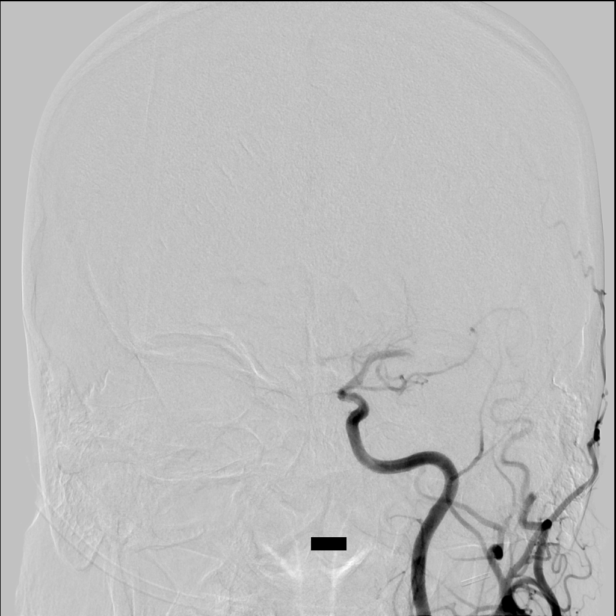
[im 93/165]
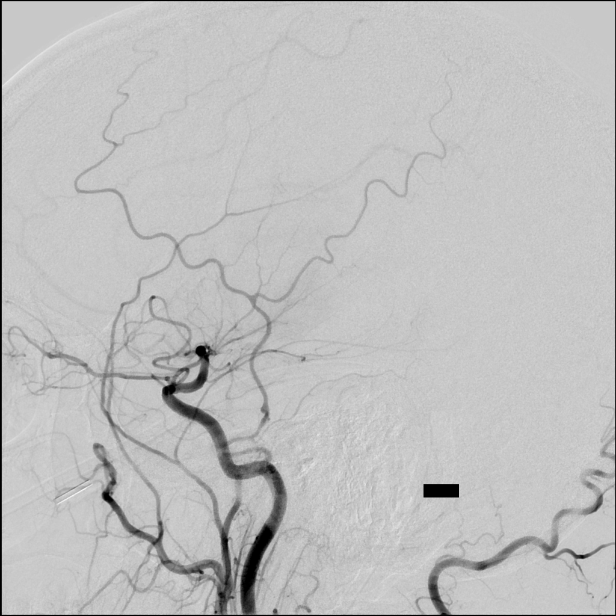
[im 107/165]
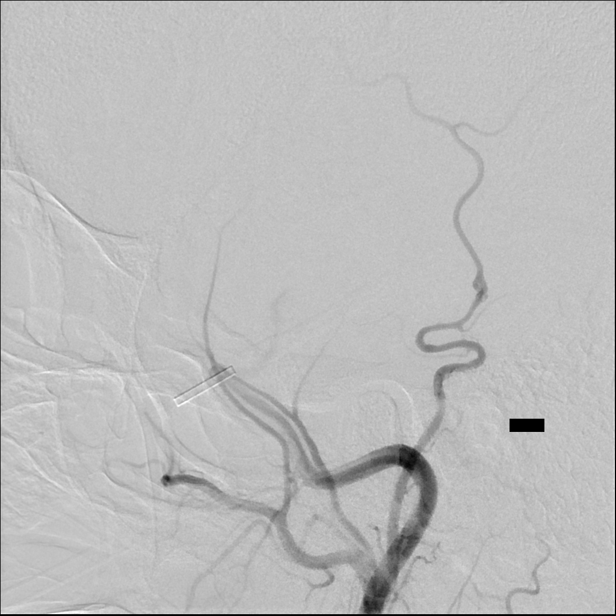
[im 122/165]
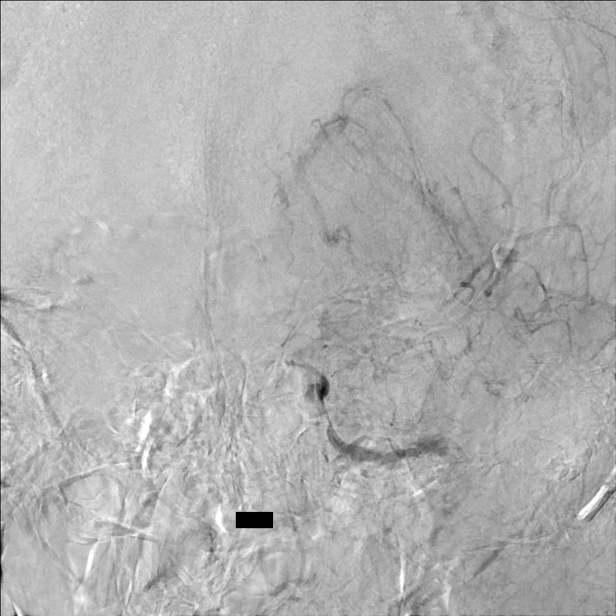
[im 136/165]
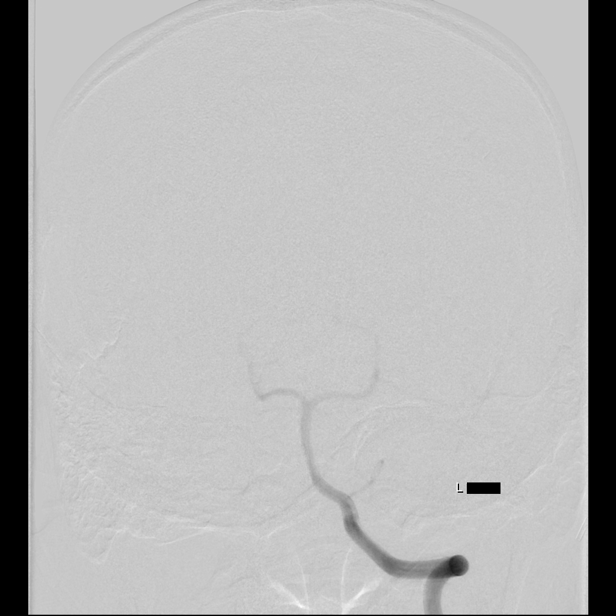
[im 150/165]
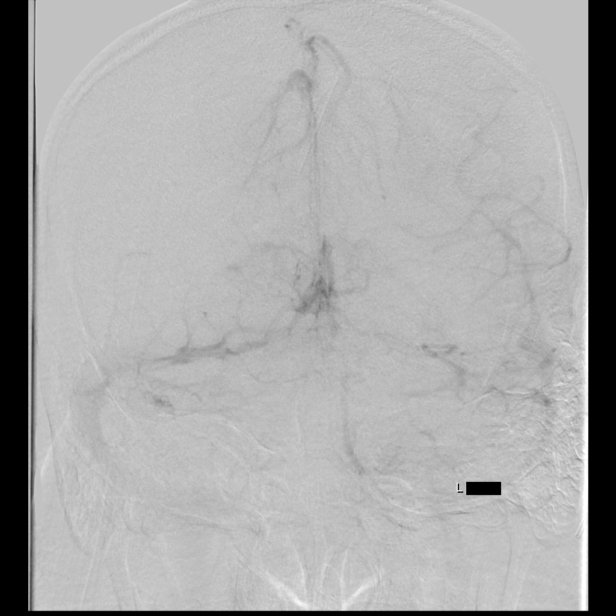
[im 165/165]
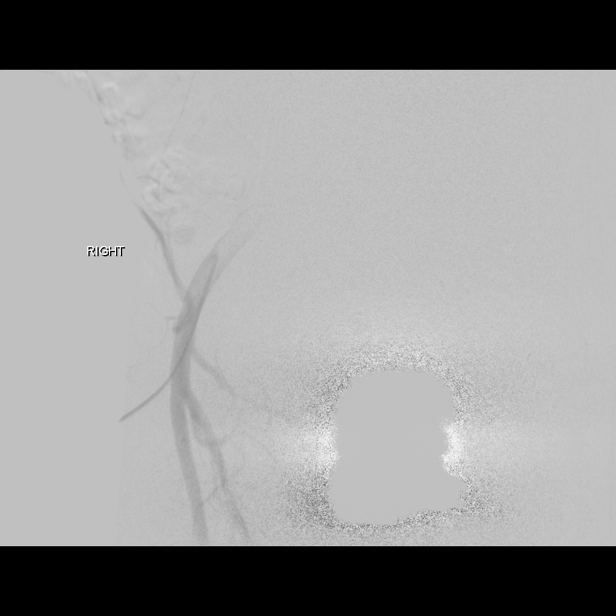

[13 of 24 positions shown; findings below may reference images not displayed]

EXAM:
IR ANGIO VERTEBRAL SEL VERTEBRAL UNI RIGHT MOD SED BILATERAL COMMON
CAROTID ARTERY AND BILATERAL VERTEBRAL ARTERY ANGIOGRAMS

ANESTHESIA/SEDATION:
Conscious sedation.

MEDICATIONS:
Versed 1 mg IV.  Fentanyl 25 mcg IV.

CONTRAST:  60 mL OMNIPAQUE IOHEXOL 300 MG/ML  SOLN

PROCEDURE:
Following a full explanation of the procedure along with the
potential associated complications, an informed witnessed consent
was obtained.

The right groin was prepped and draped in the usual sterile fashion.
Thereafter, using modified Seldinger technique, transfemoral access
into the right common femoral artery was obtained without
difficulty. Over a 0.035 inch guidewire, a 5 French JB1 catheter was
advanced to the aortic arch region and selectively positioned in the
right common carotid artery, the right vertebral artery, the left
common carotid artery and left vertebral artery.

The patient tolerated the procedure well.

COMPLICATIONS:
None immediate.
FINDINGS: The right common carotid arteriogram demonstrates the right external
carotid artery and its major branches to be widely patent.

The right internal carotid artery at the bulb to the cranial skull
base opacifies normally.

The petrous, cavernous and the supraclinoid segments are widely
patent.

The right middle cerebral artery demonstrates 25-50% narrowing just
distal to its origin. More distally the right MCA branches are seen
to opacify into the capillary and venous phases.

The right anterior cerebral artery opacifies into the capillary and
the venous phases.

Cross opacification via the anterior communicating artery of the
left anterior cerebral artery A2 segment is seen.

The right vertebral artery origin is normal.

The vessel is seen to opacify normally to the cranial skull base.
Normal opacification is seen in the right posterior inferior
cerebellar artery and the right vertebrobasilar junction.

The opacified portions of the basilar artery, the left posterior
cerebral artery, the superior cerebellar arteries and the
anterior-inferior cerebellar arteries is normal into the capillary
and venous phases.

Non-opacified blood is seen in the basilar artery and the right
posterior cerebral artery from the contralateral left vertebral
artery.

The left common carotid arteriogram demonstrates the left external
carotid artery and its major branches to be widely patent.

The left internal carotid artery at the bulb to the cranial skull
base opacifies normally.

The petrous, the cavernous and the supraclinoid segments are widely
patent.

The left middle cerebral artery in its mid M1 segment demonstrates a
complete occlusion with no distal reconstitution from the
lenticulostriates. Also demonstrated is severe stenoses of the left
anterior cerebral artery A1 segment. However, trickle flow is seen
into the left A2 segment from left-sided injection.

The left vertebral artery origin is normal.

The vessel is seen to opacify normally to the cranial skull base.
Normal opacification is seen of the left posterior inferior cerebral
artery and the left vertebrobasilar junction.

The basilar artery, the posterior cerebral arteries, the superior
cerebral arteries and the anterior inferior cerebellar arteries are
seen to opacify normally into the capillary and venous phases.

There is left meningeal collaterals opacifying the left posterior
parietal cortical and subcortical regions, arising from the left P3
segments.
IMPRESSION: Angiographically occluded left middle cerebral artery in the left
mid M1 segment.

25% to 50% stenosis of the right middle cerebral artery proximally.

## 2014-10-23 MED ORDER — HEPARIN SOD (PORK) LOCK FLUSH 100 UNIT/ML IV SOLN
INTRAVENOUS | Status: AC
Start: 2014-10-23 — End: 2014-10-24
  Filled 2014-10-23: qty 25

## 2014-10-23 MED ORDER — FENTANYL CITRATE (PF) 100 MCG/2ML IJ SOLN
INTRAMUSCULAR | Status: AC
  Filled 2014-10-23: qty 2

## 2014-10-23 MED ORDER — LIDOCAINE HCL 1 % IJ SOLN
INTRAMUSCULAR | Status: AC
  Filled 2014-10-23: qty 20

## 2014-10-23 MED ORDER — MIDAZOLAM HCL 2 MG/2ML IJ SOLN
INTRAMUSCULAR | Status: AC
  Filled 2014-10-23: qty 2

## 2014-10-23 MED ORDER — FENTANYL CITRATE (PF) 100 MCG/2ML IJ SOLN
INTRAMUSCULAR | Status: DC | PRN
  Administered 2014-10-23: 25 ug via INTRAVENOUS

## 2014-10-23 MED ORDER — SODIUM CHLORIDE 0.9 % IV SOLN: INTRAVENOUS | Status: AC

## 2014-10-23 MED ORDER — IOHEXOL 300 MG/ML  SOLN
200.0000 mL | Freq: Once | INTRAMUSCULAR | Status: AC | PRN
  Administered 2014-10-23: 75 mL via INTRA_ARTERIAL

## 2014-10-23 MED ORDER — MIDAZOLAM HCL 2 MG/2ML IJ SOLN
INTRAMUSCULAR | Status: DC | PRN
  Administered 2014-10-23: 1 mg via INTRAVENOUS

## 2014-10-23 MED ORDER — SODIUM CHLORIDE 0.9 % IV SOLN
INTRAVENOUS | Status: DC | PRN
  Administered 2014-10-23: 500 mL via INTRAVENOUS

## 2014-10-23 NOTE — Sedation Documentation (Signed)
Patient denies pain and is resting comfortably.  

## 2014-10-23 NOTE — Procedures (Signed)
S/P 4 vessel cerebral arteriogram Rt CFa approach. Findings. 1.occluded Lt MCA in M1 segment 2.severe stenosis of Lt ACA A1 seg

## 2014-10-23 NOTE — Progress Notes (Signed)
Speech Language Pathology Daily Session Note  Patient Details  Name: Mario [name] MRN: [id number] Date of Birth: January 10, 1976  Today's Date: 10/23/2014 SLP Individual Time: 0900-1000 SLP Individual Time Calculation (min): 60 min  Short Term Goals: Week 1: SLP Short Term Goal 1 (Week 1): Pt will answer basic, immediate, biographical and/or environmental yes/no questions with max assist multimodal cuing.   SLP Short Term Goal 2 (Week 1): Pt will follow 1-step directions in the context of a basic, familiar task with mod-max assist multimodal cuing.  SLP Short Term Goal 3 (Week 1): Pt will initiate vocalizations to produce vowels and/or CV syllables with max assist multimodal cuing.  SLP Short Term Goal 4 (Week 1): Pt will return demonstration of the call bell with max assist multimodal cuing.   Skilled Therapeutic Interventions:  Pt was seen for skilled ST targeting cognitive-linguistic goals.  Pt remains NPO for procedure as medical team was unable to complete arteriogram yesterday.  Will defer bedside eval to next available appointment.  Pt was received from OT session, seated upright in wheelchair, awake, alert, and agreeable to participate in ST.  SLP facilitated the session with diaphragmatic breathing exercises with manipulatives to facilitate redirection of airflow through the upper airway to initiate phonation.  Pt returned demonstration of exercises with min-mod assist demonstration cues.  Following exercises, SLP facilitated the session with trials of vowel repetitions targeting vocalization and carryover of breathing techniques.  Pt was able to achieve brief, minimal voicing intermittently but remained mostly aphonic during exercises.  Pt matched word to picture from a field of 2 for  >50% accuracy and max assist multimodal cues for awareness of errors.  Additionally, pt answered basic biographical and environmental questions for >50% accuracy with max assist visual/gestural cues.  Wife was  educated regarding cuing strategies to maximize pt's accuracy when answering yes/no questions.  Pt was returned to room, left upright in wheelchair with nurse tech and wife present.    FIM:  Comprehension Comprehension Mode: Auditory Comprehension: 2-Understands basic 25 - 49% of the time/requires cueing 51 - 75% of the time Expression Expression Mode: Nonverbal Expression: 1-Expresses basis less than 25% of the time/requires cueing greater than 75% of the time. Social Interaction Social Interaction: 2-Interacts appropriately 25 - 49% of time - Needs frequent redirection. Problem Solving Problem Solving: 1-Solves basic less than 25% of the time - needs direction nearly all the time or does not effectively solve problems and may need a restraint for safety Memory Memory: 1-Recognizes or recalls less than 25% of the time/requires cueing greater than 75% of the time  Pain Pain Assessment Pain Assessment: No/denies pain  Therapy/Group: Individual Therapy  Malayasia Mirkin, Melanee SpryNicole L 10/23/2014, 4:18 PM

## 2014-10-23 NOTE — Plan of Care (Signed)
Problem: RH BOWEL ELIMINATION Goal: RH STG MANAGE BOWEL WITH ASSISTANCE STG Manage Bowel with mod Assistance.  Outcome: Not Progressing Unknown LBM: wife thinks possibly Saturday or Sunday. Sorbitol given.     Goal: RH STG MANAGE BOWEL W/MEDICATION W/ASSISTANCE STG Manage Bowel with Medication with mod Assistance.  Outcome: Not Progressing Sorbitol given with no results

## 2014-10-23 NOTE — Progress Notes (Signed)
Physical Therapy Session Note  Patient Details  Name: Mellody DrownJerome A Storts MRN: [id number] Date of Birth: 06/30/1975  Today's Date: 10/23/2014 PT Missed Time: 60 Minutes Missed Time Reason: Other (Comment) (off the floor for angiogram)  Pt missed 60 minutes PT secondary to off the floor for test.  Edman CircleHall, Hartley Urton Faucette 10/23/2014, 4:03 PM

## 2014-10-23 NOTE — Progress Notes (Signed)
39 year old right handed male with history of hypertension maintained on hydrochlorothiazide. 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 He presented to [facility] 10/19/2014 with acute onset of right sided weakness, headache and inability to speak. Cranial CT scan negative. MRI of the head showed moderate volume of patchy acute infarct left MCA territory. Most confluent involvement at the corona radiata and lentiform nuclei. No mass effect. Carotid Dopplers with no ICA stenosis. 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 Collateral supply of left MCA branch vessels decreased in number and caliber in the area of acute infarct. Carotid Dopplers with ejection fraction of 65% without emboli Subjective/Complaints: Max to total assist for ADLs, Impulsive and pushes, not bearing weight on RLE Wife in room  Review of Systems - unable to obtain secondary to severe aphasia  Objective: Vital Signs: Blood pressure 121/97, pulse 75, temperature 98.3 F (36.8 C), temperature source Oral, resp. rate 18, height 6' (1.829 m), weight 102.9 kg (226 lb 13.7 oz), SpO2 98 %. No results found. Results for orders placed or performed during the hospital encounter of 10/21/14 (from the past 72 hour(s))  CBC     Status: None   Collection Time: 10/21/14  4:33 PM  Result Value Ref Range   WBC 7.0 4.0 - 10.5 K/uL   RBC 4.52 4.22 - 5.81 MIL/uL   Hemoglobin 13.7 13.0 - 17.0 g/dL   HCT 41.4 39.0 - 52.0 %   MCV 91.6 78.0 - 100.0 fL   MCH 30.3 26.0 - 34.0 pg   MCHC 33.1 30.0 - 36.0 g/dL   RDW 13.7 11.5 - 15.5 %   Platelets 212 150 - 400 K/uL  Creatinine, serum     Status: Abnormal   Collection Time: 10/21/14  4:33 PM  Result Value Ref Range   Creatinine, Ser 1.36 (H) 0.61 - 1.24 mg/dL   GFR calc non Af Amer >60 >60 mL/min   GFR calc Af Amer >60 >60 mL/min    Comment: (NOTE) The eGFR has been calculated using the CKD EPI equation. This calculation has not been validated in all  clinical situations. eGFR's persistently <60 mL/min signify possible Chronic Kidney Disease.   CBC WITH DIFFERENTIAL     Status: None   Collection Time: 10/22/14  6:00 AM  Result Value Ref Range   WBC 5.2 4.0 - 10.5 K/uL   RBC 4.39 4.22 - 5.81 MIL/uL   Hemoglobin 13.5 13.0 - 17.0 g/dL   HCT 40.4 39.0 - 52.0 %   MCV 92.0 78.0 - 100.0 fL   MCH 30.8 26.0 - 34.0 pg   MCHC 33.4 30.0 - 36.0 g/dL   RDW 13.8 11.5 - 15.5 %   Platelets 199 150 - 400 K/uL   Neutrophils Relative % 62 43 - 77 %   Neutro Abs 3.3 1.7 - 7.7 K/uL   Lymphocytes Relative 29 12 - 46 %   Lymphs Abs 1.5 0.7 - 4.0 K/uL   Monocytes Relative 7 3 - 12 %   Monocytes Absolute 0.4 0.1 - 1.0 K/uL   Eosinophils Relative 2 0 - 5 %   Eosinophils Absolute 0.1 0.0 - 0.7 K/uL   Basophils Relative 0 0 - 1 %   Basophils Absolute 0.0 0.0 - 0.1 K/uL  Comprehensive metabolic panel     Status: Abnormal   Collection Time: 10/22/14  6:00 AM  Result Value Ref Range   Sodium 138 135 - 145 mmol/L   Potassium 3.6 3.5 -  5.1 mmol/L   Chloride 108 101 - 111 mmol/L   CO2 23 22 - 32 mmol/L   Glucose, Bld 93 65 - 99 mg/dL   BUN 8 6 - 20 mg/dL   Creatinine, Ser 1.31 (H) 0.61 - 1.24 mg/dL   Calcium 8.6 (L) 8.9 - 10.3 mg/dL   Total Protein 6.9 6.5 - 8.1 g/dL   Albumin 3.5 3.5 - 5.0 g/dL   AST 32 15 - 41 U/L   ALT 30 17 - 63 U/L   Alkaline Phosphatase 36 (L) 38 - 126 U/L   Total Bilirubin 1.2 0.3 - 1.2 mg/dL   GFR calc non Af Amer >60 >60 mL/min   GFR calc Af Amer >60 >60 mL/min    Comment: (NOTE) The eGFR has been calculated using the CKD EPI equation. This calculation has not been validated in all clinical situations. eGFR's persistently <60 mL/min signify possible Chronic Kidney Disease.    Anion gap 7 5 - 15  Protime-INR     Status: None   Collection Time: 10/22/14 12:46 PM  Result Value Ref Range   Prothrombin Time 14.7 11.6 - 15.2 seconds   INR 1.13 0.00 - 1.49     HEENT: normal Cardio: RRR and no murmur Resp: CTA B/L and  unlabored GI: BS positive and NT.ND Extremity:  Edema Mild Right Hand Skin:   Intact Neuro: Alert/Oriented, Abnormal Sensory Reduced sensation right lower extremity greater than right upper extremity, Abnormal Motor 0/5 on muscle testing in the right upper and right lower extremity,  slight extension to pinch in the right upper, Aphasic and Other difficult to evaluate visual fields secondary to severe aphasia, may have some right inattention Musc/Skel:  Other no pain with upper extremity or lower extremity active and passive range of motion Gen. no acute distress   Assessment/Plan: 1. 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 Physiatrist is providing close team supervision and 24 hour management of active medical problems listed below. Physiatrist and rehab team continue to assess barriers to discharge/monitor patient progress toward functional and medical goals.  FIM: FIM - Bathing Bathing Steps Patient Completed: Chest, Abdomen, Front perineal area, Right upper leg, Left upper leg Bathing: 3: Mod-Patient completes 5-7 [id number] parts or 50-74%  FIM - Upper Body Dressing/Undressing Upper body dressing/undressing steps patient completed: Thread/unthread left sleeve of pullover shirt/dress, Put head through opening of pull over shirt/dress Upper body dressing/undressing: 3: Mod-Patient completed 50-74% of tasks FIM - Lower Body Dressing/Undressing Lower body dressing/undressing steps patient completed: Thread/unthread left pants leg Lower body dressing/undressing: 1: Total-Patient completed less than 25% of tasks        FIM - BControl and instrumentation engineerDevices: Arm rests Bed/Chair Transfer: 3: Supine > Sit: Mod A (lifting assist/Pt. 50-74%/lift 2 legs, 2: Bed > Chair or W/C: Max A (lift and lower assist), 2: Chair  or W/C > Bed: Max A (lift and lower assist)  FIM - Locomotion: Wheelchair Distance: 100 Locomotion: Wheelchair: 2: Travels 50 - 149 ft with moderate assistance (Pt: 50 - 74%) FIM - Locomotion: Ambulation Locomotion: Ambulation Assistive Devices: Other (comment) (L handrail) Ambulation/Gait Assistance: 1: +1 Total assist, 1: +2 Total assist Locomotion: Ambulation: 1: Two helpers  Comprehension Comprehension Mode: Auditory Comprehension: 1-Understands basic less than 25% of the time/requires cueing 75% of the time  Expression Expression Mode: Nonverbal Expression: 1-Expresses basis less than  25% of the time/requires cueing greater than 75% of the time.  Social Interaction Social Interaction: 2-Interacts appropriately 25 - 49% of time - Needs frequent redirection.  Problem Solving Problem Solving: 1-Solves basic less than 25% of the time - needs direction nearly all the time or does not effectively solve problems and may need a restraint for safety  Memory Memory: 1-Recognizes or recalls less than 25% of the time/requires cueing greater than 75% of the time  Medical Problem List and Plan: 1. 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 DVT Prophylaxis/Anticoagulation: Subcutaneous Lovenox. Monitor platelet counts and any signs of bleeding 3. Pain Management: Tylenol as needed 4. Dysphagia. Dysphagia 1 honey liquids. Monitor hydration, encourage po 5. Neuropsych: This patient is capable of making decisions on his own behalf. 6. Skin/Wound Care: Routine skin checks 7. Fluids/Electrolytes/Nutrition: Routine eye and nose with follow-up chemistries 8. Hypertension. No current antihypertensive medication. Patient on hydrochlorothiazide 12.5 mg daily prior to admission. Monitor with increased mobility  LOS (Days) 2 A FACE TO FACE EVALUATION WAS PERFORMED  Mario [name] E 10/23/2014, 8:17 AM

## 2014-10-23 NOTE — Progress Notes (Signed)
Occupational Therapy Session Note  Patient Details  Name: Mario [name] MRN: [id number] Date of Birth: 12/25/1975  Today's Date: 10/23/2014 OT Individual Time: 0800-0900 and 1400-1425 OT Individual Time Calculation (min): 60 min and 25 min    Short Term Goals: Week 1:  OT Short Term Goal 1 (Week 1): Pt will perform LB dressing with Max A and mod A standing balance during task. OT Short Term Goal 2 (Week 1): Pt will perform toilet transfer with Mod A stand pivot in order to increase I with functional transfer. OT Short Term Goal 3 (Week 1): Pt will perform Mod A shower transfer onto TTB in order to decrease assistance needed with functional transfer.  OT Short Term Goal 4 (Week 1): Pt will utilize hand over hand technique for bathing with min cues in order to increase functional involvement of hemiplegic side.   Skilled Therapeutic Interventions:    Session 1: Upon entering the room, pt seated in wheelchair with wife present in room. Pt with no c/o,signs, or symptoms of pain during session. OT assisted pt into bathroom via wheelchair. Mod A stand pivot wheelchair <> standard height toilet with use of grab bar. Pt unable to void. OT providing gestures as well as 1 step simple verbal cues for transfer back to wheelchair for shower. Pt impulsive and began taking steps towards shower which required total A as therapist providing manual facilitation for weight shift and R LE advancement. Bathing performed at shower level with HOH assist to utilize R UE for parts of tasks. Dressing with STS from wheelchair at sink side. Mod A STS as well as Mod A balance while therapist assisted with LB clothing management. Pt seated in wheelchair with QRB and arm tray donned with wife present in room.   Session 2:Upon entering the room, pt supine in bed as he was getting read to go for procedure. Pt also just getting IV placed in R arm and RN staff asking therapist to not provide PROM to arm at this time. Pt  demonstrated flexion in R thumb,index, and middle finger when asked to grasp towel. Pt unable to follow verbal cues to move digits. OT educated and demonstrated PROM at ankle and knee for R LE during session to wife. She returned demonstration and performed 10 reps of each exercise. OT provided education as caregiver with questions regarding progression of exercises and therapy process. Caregiver remaining present in room with call bell and all needed items within reach upon exiting the room.   Therapy Documentation Precautions:  Precautions Precautions: Fall Precaution Comments: global aphasia, aspiration risk, dense R hemiplegia Restrictions Weight Bearing Restrictions: No  Pain: Pain Assessment Pain Assessment: No/denies pain  See FIM for current functional status  Therapy/Group: Individual Therapy  Lowella Gripittman, Dangela How L 10/23/2014, 12:30 PM

## 2014-10-23 NOTE — IPOC Note (Addendum)
Overall Plan of Care [facility](IPOC) Patient Details Name: Mario DrownJerome A [name] MRN: [id number] DOB: 01/09/1976  Admitting Diagnosis: L MCA  CVA   Hospital Problems: Principal Problem:   Left middle cerebral artery stroke Active Problems:   Right hemiparesis   Aphasia due to stroke     Functional Problem List: Nursing Behavior, Bladder, Bowel, Nutrition, Safety, Sensory, Motor, Perception  PT Balance, Endurance, Motor, Perception, Safety, Sensory  OT Balance, Sensory, Behavior, Cognition, Vision, Edema, Endurance, Motor, Perception, Safety  SLP Cognition, Nutrition, Linguistic, Motor  TR   Activity tolerance, functional mobility, balance, cognition, safety, pain, vision       Basic ADL's: OT Grooming, Bathing, Dressing, Toileting     Advanced  ADL's: OT       Transfers: PT Bed Mobility, Bed to Chair, Car, Occupational psychologisturniture  OT Toilet, Research scientist (life sciences)Tub/Shower     Locomotion: PT Ambulation, Psychologist, prison and probation servicesWheelchair Mobility, Stairs     Additional Impairments: OT Fuctional Use of Upper Extremity  SLP Swallowing, Communication, Social Cognition comprehension, expression Problem Solving, Memory, Awareness  TR      Anticipated Outcomes Item Anticipated Outcome  Self Feeding set up A  Swallowing      Basic self-care  Supervision - Min A  Toileting  Supervision   Bathroom Transfers supervision - toilet and Min A shower  Bowel/Bladder  manged bowel and bladder with minimal assist   Transfers  S  Locomotion  S w/c level and gait with therapy only   Communication  Mod assist for multimodal communication   Cognition  Min assist   Pain  faces 2 or less   Safety/Judgment  managed safety with max assist    Therapy Plan: PT Intensity: Minimum of 1-2 x/day ,45 to 90 minutes PT Frequency: 5 out of 7 days PT Duration Estimated Length of Stay: 25-28 days  OT Intensity: Minimum of 1-2 x/day, 45 to 90 minutes OT Frequency: 5 out of 7 days OT Duration/Estimated Length of Stay: 25-28 days SLP Intensity: Minumum of  1-2 x/day, 30 to 90 minutes SLP Frequency: 3 to 5 out of 7 days SLP Duration/Estimated Length of Stay: 21-28 days   TR Duration/ELOS: 4 weeks TR Frequency:  Min 1 time per week >20 minutes        Team Interventions: Nursing Interventions Patient/Family Education, Bowel Management, Dysphagia/Aspiration Precaution Training, Psychosocial Support, Bladder Management, Medication Management, Cognitive Remediation/Compensation, Disease Management/Prevention, Discharge Planning  PT interventions Ambulation/gait training, Balance/vestibular training, Cognitive remediation/compensation, Discharge planning, Disease management/prevention, DME/adaptive equipment instruction, Functional electrical stimulation, Functional mobility training, Neuromuscular re-education, Patient/family education, Psychosocial support, Splinting/orthotics, Stair training, Therapeutic Activities, Therapeutic Exercise, UE/LE Strength taining/ROM, UE/LE Coordination activities, Visual/perceptual remediation/compensation, Wheelchair propulsion/positioning  OT Interventions Balance/vestibular training, Discharge planning, Functional electrical stimulation, Self Care/advanced ADL retraining, Therapeutic Activities, UE/LE Coordination activities, Cognitive remediation/compensation, Functional mobility training, Patient/family education, Therapeutic Exercise, Visual/perceptual remediation/compensation, Community reintegration, DME/adaptive equipment instruction, Neuromuscular re-education, Psychosocial support, UE/LE Strength taining/ROM, Wheelchair propulsion/positioning  SLP Interventions Cognitive remediation/compensation, Financial traderCueing hierarchy, Patient/family education, Speech/Language facilitation, Internal/external aids, Environmental controls, Functional tasks, Dysphagia/aspiration precaution training, Multimodal communication approach  TR Interventions Recreation/leisure participation, Balance/Vestibular training, functional mobility,  therapeutic activities, UE/LE strength/coordination, w/c mobility, cognitive retraining/compensation, community reintegration, pt/family education, adaptive equipment instruction/use, discharge planning, psychosocial support  SW/CM Interventions Discharge Planning, Psychosocial Support, Patient/Family Education    Team Discharge Planning: Destination: PT-Home ,OT-   , SLP-Home Projected Follow-up: PT-Home health PT, 24 hour supervision/assistance, OT-   , SLP-Home Health SLP, 24 hour supervision/assistance Projected Equipment Needs: PT-To be determined, OT-  , SLP-None  recommended by SLP Equipment Details: PT- , OT-  Patient/family involved in discharge planning: PT- Patient, Family member/caregiver,  OT- , SLP-Patient, Family member/caregiver  MD ELOS: 20-28 days Medical Rehab Prognosis:  Excellent Assessment: The patient has been admitted for CIR therapies with the diagnosis of left MCA infarct. The team will be addressing functional mobility, strength, stamina, balance, safety, adaptive techniques and equipment, self-care, bowel and bladder mgt, patient and caregiver education, communication, language, swallowing, stroke education, egosupport, leisure awareness. Goals have been set at supervision to min assist with mobility and self-care and min to mod assist with communication.    Mario OysterZachary T. Swartz, MD, FAAPMR      See Team Conference Notes for weekly updates to the plan of care

## 2014-10-24 ENCOUNTER — Inpatient Hospital Stay (HOSPITAL_COMMUNITY): Payer: BC Managed Care – PPO | Admitting: Occupational Therapy

## 2014-10-24 ENCOUNTER — Encounter (HOSPITAL_COMMUNITY): Payer: BC Managed Care – PPO | Admitting: Occupational Therapy

## 2014-10-24 ENCOUNTER — Inpatient Hospital Stay (HOSPITAL_COMMUNITY): Payer: BC Managed Care – PPO | Admitting: Speech Pathology

## 2014-10-24 ENCOUNTER — Inpatient Hospital Stay (HOSPITAL_COMMUNITY): Payer: BC Managed Care – PPO | Admitting: Physical Therapy

## 2014-10-24 NOTE — Progress Notes (Signed)
Occupational Therapy Session Note  Patient Details  Name: Mario [name] MRN: [id number] Date of Birth: [birth year]  Today's Date: 10/24/2014 OT Individual Time: 1000-1100 and 1300-1330 OT Individual Time Calculation (min): 60 min and 30 min    Short Term Goals: Week 1:  OT Short Term Goal 1 (Week 1): Pt will perform LB dressing with Max A and mod A standing balance during task. OT Short Term Goal 2 (Week 1): Pt will perform toilet transfer with Mod A stand pivot in order to increase I with functional transfer. OT Short Term Goal 3 (Week 1): Pt will perform Mod A shower transfer onto TTB in order to decrease assistance needed with functional transfer.  OT Short Term Goal 4 (Week 1): Pt will utilize hand over hand technique for bathing with min cues in order to increase functional involvement of hemiplegic side.   Skilled Therapeutic Interventions/Progress Updates:   Session 1: Upon entering the room, pt seated in wheelchair with NT and wife present in room. No signs or symptoms of pain during session. NT assisted pt with dressing tasks prior to therapist arrival. His wife was assisting with with shaving upon entering the room. Once finished, pt seated in wheelchair at sink with HOH assist to utilize R UE in washing his face. Pt then propelled to day room for therapeutic activity. Pt standing at table top with therapist blocking R knee from buckling and placing R UE onto table in weight bearing position. Pt standing with mod A at table while sorting cards by color with L hand. Pt requiring min verbal cues when card not placed in correct place. Pt then given cards 3- 12 and asked to place into order. Pt unable to line cards up without assistance from therapist. Pt standing for a total of ~ 9 minutes. OT performed PROM to shoulder,elbow,wrist, and hand in all planes x 5 reps each. Pt propelled self 3475' with hemiplegic technique and mod A as pt bumping into objects on R side. Therapist assisted pt the  rest of the way into room. QRB and arm tray donned with call bell and all needed items within reach.   Session 2: Pt seated in wheelchair receiving visitors upon entering the room. His wife reports that pt stated, "Hey" to visitors when they entered the room. OT placed shoe buttons into B shoes in order to increase I with LB dressing. OT demonstrated and practiced HOH actions of how to "hook/unhook" laces. Pt unable to follow verbal cues to hook laces without assist. OT also provided LH sponge and bath mit and educated and demonstrated how to utilize AE in order to increase I with self care. Pt stood at sink with multiple verbal cues, gestures, and tactile cues for hand placement during STS. Pt standing with mod A at sink to brush teeth. Therapist noticed possible oral thrush forming and notified RN. Pt seated in wheelchair with QRB , arm tray, and call bell within reach upon exiting the room.   Therapy Documentation Precautions:  Precautions Precautions: Fall Precaution Comments: global aphasia, aspiration risk, dense R hemiplegia Restrictions Weight Bearing Restrictions: No Pain: Pain Assessment Pain Assessment: No/denies pain  See FIM for current functional status  Therapy/Group: Individual Therapy  Lowella Gripittman, Emmali Karow L 10/24/2014, 12:49 PM

## 2014-10-24 NOTE — Progress Notes (Signed)
Speech Language Pathology Daily Session Note  Patient Details  Name: Mario [name] MRN: [id number] Date of Birth: 12/19/1975  Today's Date: 10/24/2014 SLP Individual Time: 0915-1000 SLP Individual Time Calculation (min): 45 min  Short Term Goals: Week 1: SLP Short Term Goal 1 (Week 1): Pt will answer basic, immediate, biographical and/or environmental yes/no questions with max assist multimodal cuing.   SLP Short Term Goal 2 (Week 1): Pt will follow 1-step directions in the context of a basic, familiar task with mod-max assist multimodal cuing.  SLP Short Term Goal 3 (Week 1): Pt will initiate vocalizations to produce vowels and/or CV syllables with max assist multimodal cuing.  SLP Short Term Goal 4 (Week 1): Pt will return demonstration of the call bell with max assist multimodal cuing.  SLP Short Term Goal 5 (Week 1): Pt will consume his currently presecribed diet with min assist verbal cues for use of rate and portion control, liquid wash, lingual sweep, and manual assist to monitor and correct right sided buccal residue.   Skilled Therapeutic Interventions:  Pt was seen for skilled ST targeting cognitive-linguistic goals in addition to BSE swallow eval.  See separate report for results and recommendations regarding pt's current swallowing function.  Upon arrival, pt was laying in bed with wife present at bedside.  Pt was awake, alert, and agreeable to participate in ST.  SLP transferred pt to wheelchair with assistance from nurse tech for pt safety.   SLP facilitated the session with continued practice of diaphragmatic breathing exercises with manipulatives to facilitate movement of airflow through the upper airway and initiate phonation.  Pt returned demonstration of techniques with min-mod assist multimodal cues for x10 repetitions.  Following exercises, SLP facilitated the session with max assist verbal and visual demonstration cues for repetition of vowels to facilitate onset of phonation,  pt remained aphonic during the abovementioned exercises but was able to move mouth to approximate and alternate between two different vowel shapes.  Pt was 80% accurate for matching object to word from a field of 2, improving to 90% accurate with min assist verbal and visual cues.  Pt also answered basic, environmental yes/no questions with mod assist verbal and visual cues; however, he required max assist multimodal cues to answer basic, factual questions.  Pt returned to room, where he returned demonstration for use of call bell with max assist multimodal cues x1.  Pt left upright in wheelchair, wife present, and all needs left within reach.  Continue per current plan of care.     FIM:  Comprehension Comprehension Mode: Auditory Comprehension: 2-Understands basic 25 - 49% of the time/requires cueing 51 - 75% of the time Expression Expression Mode: Nonverbal Expression: 1-Expresses basis less than 25% of the time/requires cueing greater than 75% of the time. Social Interaction Social Interaction: 2-Interacts appropriately 25 - 49% of time - Needs frequent redirection. Problem Solving Problem Solving: 1-Solves basic less than 25% of the time - needs direction nearly all the time or does not effectively solve problems and may need a restraint for safety Memory Memory: 1-Recognizes or recalls less than 25% of the time/requires cueing greater than 75% of the time FIM - Eating Eating Activity: 4: Helper checks for pocketed food;5: Needs verbal cues/supervision  Pain Pain Assessment Pain Assessment: No/denies pain  Therapy/Group: Individual Therapy  Travell Desaulniers, Melanee SpryNicole L 10/24/2014, 12:31 PM

## 2014-10-24 NOTE — Progress Notes (Signed)
Physical Therapy Session Note  Patient Details  Name: Mario [name] MRN: [id number] Date of Birth: 04/17/1976  Today's Date: 10/24/2014 PT Individual Time: 1415-1515 PT Individual Time Calculation (min): 60 min   Short Term Goals: Week 1:  PT Short Term Goal 1 (Week 1): Pt will perform bed mobility with HOB flat and without rails at min A level with 50% cues to attend to RUE/LE.  PT Short Term Goal 2 (Week 1): Pt will perform functional transfers R and L at mod A level with 50% cues to attend to RUE/LE PT Short Term Goal 3 (Week 1): Pt will perform dynamic standing activity x 3 mins at mod A level and single UE support PT Short Term Goal 4 (Week 1): Pt will self propel w/c x 100' using L hemi technique at min A level with 50% cues to attend to the R.   PT Short Term Goal 5 (Week 1): Pt will ambulate x 25' w/ LRAD and max A with +2A for chair follow  Skilled Therapeutic Interventions/Progress Updates:  Pt was seen bedside in the pm. Pt transferred supine to edge of bed with side rail and mod A. Pt transferred edge of bed to w/c with mod A and verbal cues. Pt propelled w/c about 125 feet with L UE and L LE with S and verbal cues. Treatment focused on ambulation, utilized second person w/c follow for safety for all ambulation. Pt ambulated 25 feet with max A and verbal cues with L hand rail and physical assist to advance R LE. Pt ambulated 25 feet with R PLS and L hand rail with max A, verbal cues and physical assist to advance R LE. Pt ambulated 25 feet with R PLS, L 1/4" shoe lift, L hand rail and mod to max A, verbal cues, and physical cues to advance R LE. Pt ambulated 25 feet x 3 with hemiwalker, R PLS, L 1/4 "shoe lift, initially max A to progressing to mod A by final walk with physical assist to advance R LE. Pt returned to room. Pt transferred w/c to edge of bed with mod A. Pt transferred edge of bed to supine with min A and verbal cues. Pt left sitting up in bed with wife at bedside.    Therapy Documentation Precautions:  Precautions Precautions: Fall Precaution Comments: global aphasia, aspiration risk, dense R hemiplegia Restrictions Weight Bearing Restrictions: No General:   Pain: No c/o pain.    Locomotion : Ambulation Ambulation/Gait Assistance: 3: Mod assist;2: Max Lawyerassist Wheelchair Mobility Distance: 75   See FIM for current functional status  Therapy/Group: Individual Therapy  [name], Mario Villamar G 10/24/2014, 4:15 PM

## 2014-10-24 NOTE — Progress Notes (Signed)
Patient ID: Mario [name], male   DOB: [birth year], 39 y.o.   MRN: [id number]  10/24/14.  39 year old right handed male with history of hypertension maintained on hydrochlorothiazide.  He presented to [facility] 10/19/2014 with acute onset of right sided weakness, headache and inability to speak. Cranial CT scan negative. MRI of the head showed moderate volume of patchy acute infarct left MCA territory. 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 Collateral supply of left MCA branch vessels decreased in number and caliber in the area of acute infarct. 2 D Echo with ejection fraction of 65% without emboli  Subjective/Complaints:  Alert, comfortable, in no distress.  Unable to follow simple commands due to severe receptive aphasia  Review of Systems - unable to obtain secondary to severe aphasia   Past Medical History  Diagnosis Date  . Hypertension      Objective: Vital Signs: Blood pressure 111/69, pulse 71, temperature 98.4 F (36.9 C), temperature source Oral, resp. rate 17, height 6' (1.829 m), weight 226 lb 13.7 oz (102.9 kg), SpO2 98 %. No results found. Results for orders placed or performed during the hospital encounter of 10/21/14 (from the past 72 hour(s))  CBC     Status: None   Collection Time: 10/21/14  4:33 PM  Result Value Ref Range   WBC 7.0 4.0 - 10.5 K/uL   RBC 4.52 4.22 - 5.81 MIL/uL   Hemoglobin 13.7 13.0 - 17.0 g/dL   HCT 41.4 39.0 - 52.0 %   MCV 91.6 78.0 - 100.0 fL   MCH 30.3 26.0 - 34.0 pg   MCHC 33.1 30.0 - 36.0 g/dL   RDW 13.7 11.5 - 15.5 %   Platelets 212 150 - 400 K/uL  Creatinine, serum     Status: Abnormal   Collection Time: 10/21/14  4:33 PM  Result Value Ref Range   Creatinine, Ser 1.36 (H) 0.61 - 1.24 mg/dL   GFR calc non Af Amer >60 >60 mL/min   GFR calc Af Amer >60 >60 mL/min    Comment: (NOTE) The eGFR has been calculated using the CKD EPI equation. This calculation has not been validated in all clinical situations. eGFR's persistently  <60 mL/min signify possible Chronic Kidney Disease.   CBC WITH DIFFERENTIAL     Status: None   Collection Time: 10/22/14  6:00 AM  Result Value Ref Range   WBC 5.2 4.0 - 10.5 K/uL   RBC 4.39 4.22 - 5.81 MIL/uL   Hemoglobin 13.5 13.0 - 17.0 g/dL   HCT 40.4 39.0 - 52.0 %   MCV 92.0 78.0 - 100.0 fL   MCH 30.8 26.0 - 34.0 pg   MCHC 33.4 30.0 - 36.0 g/dL   RDW 13.8 11.5 - 15.5 %   Platelets 199 150 - 400 K/uL   Neutrophils Relative % 62 43 - 77 %   Neutro Abs 3.3 1.7 - 7.7 K/uL   Lymphocytes Relative 29 12 - 46 %   Lymphs Abs 1.5 0.7 - 4.0 K/uL   Monocytes Relative 7 3 - 12 %   Monocytes Absolute 0.4 0.1 - 1.0 K/uL   Eosinophils Relative 2 0 - 5 %   Eosinophils Absolute 0.1 0.0 - 0.7 K/uL   Basophils Relative 0 0 - 1 %   Basophils Absolute 0.0 0.0 - 0.1 K/uL  Comprehensive metabolic panel     Status: Abnormal   Collection Time: 10/22/14  6:00 AM  Result Value Ref Range   Sodium 138 135 - 145 mmol/L  Potassium 3.6 3.5 - 5.1 mmol/L   Chloride 108 101 - 111 mmol/L   CO2 23 22 - 32 mmol/L   Glucose, Bld 93 65 - 99 mg/dL   BUN 8 6 - 20 mg/dL   Creatinine, Ser 1.31 (H) 0.61 - 1.24 mg/dL   Calcium 8.6 (L) 8.9 - 10.3 mg/dL   Total Protein 6.9 6.5 - 8.1 g/dL   Albumin 3.5 3.5 - 5.0 g/dL   AST 32 15 - 41 U/L   ALT 30 17 - 63 U/L   Alkaline Phosphatase 36 (L) 38 - 126 U/L   Total Bilirubin 1.2 0.3 - 1.2 mg/dL   GFR calc non Af Amer >60 >60 mL/min   GFR calc Af Amer >60 >60 mL/min    Comment: (NOTE) The eGFR has been calculated using the CKD EPI equation. This calculation has not been validated in all clinical situations. eGFR's persistently <60 mL/min signify possible Chronic Kidney Disease.    Anion gap 7 5 - 15  Protime-INR     Status: None   Collection Time: 10/22/14 12:46 PM  Result Value Ref Range   Prothrombin Time 14.7 11.6 - 15.2 seconds   INR 1.13 0.00 - 1.49    Patient Vitals for the past 24 hrs:  BP Temp Temp src Pulse Resp SpO2  10/24/14 0539 111/69 mmHg 98.4  F (36.9 C) Oral 71 17 98 %  10/23/14 2212 127/79 mmHg 98.1 F (36.7 C) Oral 74 16 97 %  10/23/14 2037 140/89 mmHg - - 74 17 99 %  10/23/14 1928 (!) 157/84 mmHg - - 65 16 99 %  10/23/14 1900 (!) 109/52 mmHg - - 66 16 98 %  10/23/14 1830 (!) 140/91 mmHg - - 70 - 100 %  10/23/14 1758 (!) 134/93 mmHg - - 65 17 100 %  10/23/14 1729 131/76 mmHg - - 62 18 100 %  10/23/14 1715 (!) 148/91 mmHg - - 63 18 100 %  10/23/14 1700 (!) 144/84 mmHg - - 64 17 98 %  10/23/14 1644 (!) 155/89 mmHg - - 69 19 100 %  10/23/14 1621 (!) 139/91 mmHg - - 72 20 99 %  10/23/14 1605 (!) 145/92 mmHg - - 68 18 100 %  10/23/14 1550 (!) 142/87 mmHg - - 69 18 100 %  10/23/14 1545 135/87 mmHg - - 64 18 100 %  10/23/14 1540 134/86 mmHg - - 62 17 100 %  10/23/14 1535 134/82 mmHg - - 63 17 100 %  10/23/14 1530 (!) 145/83 mmHg - - 67 17 100 %  10/23/14 1525 (!) 146/95 mmHg - - 64 (!) 21 100 %  10/23/14 1519 (!) 156/96 mmHg - - 66 (!) 21 100 %  10/23/14 1400 124/88 mmHg 98.4 F (36.9 C) Oral 77 18 99 %     Intake/Output Summary (Last 24 hours) at 10/24/14 0835 Last data filed at 10/24/14 2353  Gross per 24 hour  Intake    990 ml  Output    650 ml  Net    340 ml    HEENT: normal Cardio: RRR and no murmur Resp: CTA B/L and unlabored GI: BS positive and NT.ND Extremity:  Edema Mild Right Hand Skin:   Intact Neuro: Alert/Oriented, severe.  A aphasia with dense right hemiparesis Musc/Skel:  No edema  Gen. no acute distress   Assessment/Plan: 1. 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 DVT Prophylaxis/Anticoagulation: Subcutaneous Lovenox. Monitor platelet counts  and any signs of bleeding  3. Dysphagia. Dysphagia 1 honey liquids. Monitor hydration, encourage po  4. Hypertension. No current antihypertensive medication. Patient on hydrochlorothiazide 12.5 mg daily prior to admission. Monitor with increased mobility   LOS (Days) 3 A FACE TO FACE  EVALUATION WAS PERFORMED  Nyoka Cowden 10/24/2014, 8:32 AM

## 2014-10-24 NOTE — Evaluation (Signed)
Speech Language Pathology Bedside Swallow Assessment and Plan  Patient Details  Name: Mario [name] MRN: [id number] Date of Birth: [birth year]  SLP Diagnosis: Aphasia;Apraxia;Dysphagia;Cognitive Impairments  Rehab Potential: Good ELOS: 21-28 days    Today's Date: 10/24/2014 SLP Individual Time: 8299-3716 SLP Individual Time Calculation (min): 15 min   Problem List:  Patient Active Problem List   Diagnosis Date Noted  . 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 Right hemiparesis 10/21/2014  . 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 Hypertension    Past Surgical History:  Past Surgical History  Procedure Laterality Date  . No past surgeries      Assessment / Plan / Recommendation Clinical Impression   39 year old right handed male with history of hypertension maintained on hydrochlorothiazide. He presented to [facility] 10/19/2014 with acute onset of right sided weakness, headache and inability to speak. Cranial CT scan negative. MRI of the head showed moderate volume of patchy acute infarct left MCA territory. 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 Collateral supply of left MCA branch vessels decreased in number and caliber in the area of acute infarct. 2 D Echo with ejection fraction of 65% without emboli.  SLP evaluation completed 10/22/2014 however SLP was unable to complete bedside swallow evaluation on that date or on 5/20 due to pt NPO for procedure.  Therefore, BSE was completed today with the following results: Pt presents with s/s of a moderately-severe oropharyngeal dysphagia.  Oral phase impairments are characterized by reduced oral manipulation and prolonged posterior transit of boluses resulting from right sided labial, lingual, and buccal weakness.  The abovementioned deficits resulted in mild right sided buccal residue of purees and solids as well as intermittent right sided anterior labial loss of  boluses.  Residue was cleared with cuing to alternate solids and liquids.   Pt presented with suspected delayed swallow initiation with no overt s/s of aspiration evident following cup sips of honey thick liquids, purees, or solid textures.  Given evidence of silent aspiration on initial MBS and given the extent of pt's neuro deficits, pt will need a repeat MBS prior to liquids advancement.     Skilled Therapeutic Interventions          Bedside swallow evaluation completed with results and recommendations reviewed with family.     SLP Assessment  Patient will need skilled Speech Lanaguage Pathology Services during CIR admission    Recommendations  SLP Diet Recommendations: Honey;Dysphagia 1 (Puree) Liquid Administration via: Cup Medication Administration: Crushed with puree Supervision: Patient able to self feed;Full supervision/cueing for compensatory strategies Patient destination: Home Follow up Recommendations: Home Health SLP;24 hour supervision/assistance Equipment Recommended: None recommended by SLP    SLP Frequency 3 to 5 out of 7 days   SLP Treatment/Interventions Cognitive remediation/compensation;Cueing hierarchy;Patient/family education;Speech/Language facilitation;Internal/external aids;Environmental controls;Functional tasks;Dysphagia/aspiration precaution training;Multimodal communication approach    Pain Pain Assessment Pain Assessment: No/denies pain  Short Term Goals: Week 1: SLP Short Term Goal 1 (Week 1): Pt will answer basic, immediate, biographical and/or environmental yes/no questions with max assist multimodal cuing.   SLP Short Term Goal 2 (Week 1): Pt will follow 1-step directions in the context of a basic, familiar task with mod-max assist multimodal cuing.  SLP Short Term Goal 3 (Week 1): Pt will initiate vocalizations to produce vowels and/or CV syllables with max assist multimodal cuing.  SLP Short Term Goal 4 (Week 1): Pt will return demonstration of the  call bell with max assist multimodal cuing.   See FIM for current functional status Refer to Care Plan for Long Term Goals  Recommendations for other services: None  Discharge Criteria: Patient will be discharged from SLP if patient refuses treatment 3 consecutive times without medical reason, if treatment goals not met, if there is a change in medical status, if patient makes no progress towards goals or if patient is discharged from hospital.  The above assessment, treatment plan, treatment alternatives and goals were discussed and mutually agreed upon: by patient  Emilio Math 10/24/2014, 12:22 PM

## 2014-10-25 ENCOUNTER — Inpatient Hospital Stay (HOSPITAL_COMMUNITY): Payer: BC Managed Care – PPO | Admitting: Physical Therapy

## 2014-10-25 MED ORDER — SORBITOL 70 % SOLN
60.0000 mL | Freq: Every day | Status: DC | PRN
  Administered 2014-10-27 – 2014-10-28 (×2): 60 mL via ORAL
  Administered 2014-11-05 – 2014-11-06 (×2): 30 mL via ORAL
  Filled 2014-10-25 (×3): qty 60

## 2014-10-25 NOTE — Progress Notes (Signed)
Patient ID: Mario DrownJerome A [name], male   DOB: 04-09-1976, 39 y.o.   MRN: [id number]  Patient ID: Mario [name], male   DOB: 04-09-1976, 39 y.o.   MRN: [id number]  10/25/14.  39 year old right handed male with history of hypertension maintained on hydrochlorothiazide.  [facility] with acute onset of right sided weakness, headache and inability to speak. Cranial CT scan negative. MRI of the head showed moderate volume of patchy acute infarct left MCA territory. 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 Collateral supply of left MCA branch vessels decreased in number and caliber in the area of acute infarct. 2 D Echo with ejection fraction of 65% without emboli  Subjective/Complaints:  Alert, comfortable, in no distress.  Unable to follow simple commands due to severe receptive aphasia. Patient is eating well.  Patient has coated tongue and staff is concerned about thrush.  Patient has had some constipation  Review of Systems - unable to obtain secondary to severe aphasia   Past Medical History  Diagnosis Date  . Hypertension      Objective: Vital Signs: Blood pressure 127/67, pulse 78, temperature 98.5 F (36.9 C), temperature source Oral, resp. rate 16, height 6' (1.829 m), weight 226 lb 13.7 oz (102.9 kg), SpO2 97 %. No results found. Results for orders placed or performed during the hospital encounter of 10/21/14 (from the past 72 hour(s))  Protime-INR     Status: None   Collection Time: 10/22/14 12:46 PM  Result Value Ref Range   Prothrombin Time 14.7 11.6 - 15.2 seconds   INR 1.13 0.00 - 1.49    Patient Vitals for the past 24 hrs:  BP Temp Temp src Pulse Resp SpO2  10/25/14 0551 127/67 mmHg 98.5 F (36.9 C) Oral 78 16 97 %  10/24/14 1416 124/71 mmHg 98.4 F (36.9 C) Oral 62 18 98 %     Intake/Output Summary (Last 24 hours) at 10/25/14 [id number] Last data filed at 10/25/14 0202  Gross per 24 hour  Intake    490 ml  Output    250 ml  Net    240 ml     HEENT:  Tongue is coated but no lesions of the hard or soft palate Cardio: RRR and no murmur Resp: CTA B/L and unlabored GI: BS positive and NT.ND Extremity:  Edema Mild Right Hand Skin:   Intact Neuro: Alert/Oriented, severe.  A aphasia with dense right hemiparesis Musc/Skel:  No edema  Gen. no acute distress   Assessment/Plan: 1. 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 DVT Prophylaxis/Anticoagulation: Subcutaneous Lovenox. Monitor platelet counts and any signs of bleeding  3. Dysphagia. Dysphagia 1 honey liquids. Monitor hydration, encourage po  4. Hypertension. No current antihypertensive medication. Patient on hydrochlorothiazide 12.5 mg daily prior to admission. Monitor with increased mobility   5.  Constipation.  Will increase sorbitol dose  6.  Coated tongue/possible thrush.  Will reassess in the morning  LOS (Days) 4 A FACE TO FACE EVALUATION WAS PERFORMED  Mario [name],Mario [name] 10/25/2014, 8:08 AM

## 2014-10-25 NOTE — Progress Notes (Signed)
Physical Therapy Session Note  Patient Details  Name: Mario [name] MRN: [id number] Date of Birth: 06/24/1975  Today's Date: 10/25/2014 PT Individual Time: 1330-1430 PT Individual Time Calculation (min): 60 min   Short Term Goals: Week 1:  PT Short Term Goal 1 (Week 1): Pt will perform bed mobility with HOB flat and without rails at min A level with 50% cues to attend to RUE/LE.  PT Short Term Goal 2 (Week 1): Pt will perform functional transfers R and L at mod A level with 50% cues to attend to RUE/LE PT Short Term Goal 3 (Week 1): Pt will perform dynamic standing activity x 3 mins at mod A level and single UE support PT Short Term Goal 4 (Week 1): Pt will self propel w/c x 100' using L hemi technique at min A level with 50% cues to attend to the R.   PT Short Term Goal 5 (Week 1): Pt will ambulate x 25' w/ LRAD and max A with +2A for chair follow  Skilled Therapeutic Interventions/Progress Updates:  Pt was seen bedside in the pm. Pt transferred supine to edge of bed with side rail and mod A with verbal cues. Pt transferred edge of bed with mod A and verbal cues. Pt propelled w/c about 150 feet with L UE and LE, required S and verbal cues. Pt initially required increased time to figure proper technique even with verbal cues between UE and LE. Pt required occasional verbal cues secondary to inattention. Pt transferred w/c to edge of mat with mod A and verbal cues. Pt transferred edge of mat to supine with min A. While on mat treatment focused on NMR and strengthening R LE, utilized tapping and vibration to facilitate contractions R LE. Performed hip flex, hip abd/add, B bridging and unilateral bridging, 2 sets x 10 reps each. Pt transferred supine to edge of mat with mod A. Pt transferred edge of mat to w/c with mod A. Donned R PLS AFO and L 1/4" shoe lift. Utilized second person with w/c follow for safety for all ambulation. Pt ambulated 25 feet x 3 with L hand rail and mod A with verbal as well as  physical cues to advance R LE. On 3rd attempt increased shoe lift to 1/2", pt was able to advance R LE with verbal and tactile cues. Pt ambulated with hemiwalker, 25 feet x 1 with mod A and verbal cues as well as physical cues to advance R LE. Pt propelled w/c back to room about 150 feet with S. Pt transferred w/c to recliner with mod A and verbal cues. Pt left sitting up in recliner with wife at bedside.  Precautions:  Precautions Precautions: Fall Precaution Comments: global aphasia, aspiration risk, dense R hemiplegia Restrictions Weight Bearing Restrictions: No General:   Pain: No c/o pain.    Locomotion : Ambulation Ambulation/Gait Assistance: 3: Mod assist   See FIM for current functional status  Therapy/Group: Individual Therapy  Rayford HalstedMitchell, Saddie Sandeen G 10/25/2014, 3:37 PM

## 2014-10-26 ENCOUNTER — Inpatient Hospital Stay (HOSPITAL_COMMUNITY): Payer: BC Managed Care – PPO | Admitting: Rehabilitation

## 2014-10-26 ENCOUNTER — Encounter (HOSPITAL_COMMUNITY): Payer: BC Managed Care – PPO

## 2014-10-26 ENCOUNTER — Inpatient Hospital Stay (HOSPITAL_COMMUNITY): Payer: BC Managed Care – PPO | Admitting: Speech Pathology

## 2014-10-26 MED ORDER — PANTOPRAZOLE SODIUM 40 MG PO PACK
40.0000 mg | PACK | Freq: Every day | ORAL | Status: DC
  Administered 2014-10-27 – 2014-11-17 (×21): 40 mg
  Filled 2014-10-26 (×26): qty 20

## 2014-10-26 NOTE — Progress Notes (Signed)
Occupational Therapy Session Note  Patient Details  Name: Mario [name] MRN: [id number] Date of Birth: [birth year]  Today's Date: 10/26/2014 OT Individual Time: 0900-1000 OT Individual Time Calculation (min): 60 min    Short Term Goals: Week 1:  OT Short Term Goal 1 (Week 1): Pt will perform LB dressing with Max A and mod A standing balance during task. OT Short Term Goal 2 (Week 1): Pt will perform toilet transfer with Mod A stand pivot in order to increase I with functional transfer. OT Short Term Goal 3 (Week 1): Pt will perform Mod A shower transfer onto TTB in order to decrease assistance needed with functional transfer.  OT Short Term Goal 4 (Week 1): Pt will utilize hand over hand technique for bathing with min cues in order to increase functional involvement of hemiplegic side.   Skilled Therapeutic Interventions/Progress Updates:    Pt seated EOB with wife present.  Pt's wife present throughout session.  Pt's wife stated that nursing had just previously cleaned pt's buttocks but he did need to bathe and get dressed.  Pt's wife unsure how pt would respond to male therapist so she wanted to remain in room.  Pt required max multimodal cues for stand pivot transfer bed->w/c.  Pt required max multimodal cues throughout session for task initiation and sequencing during bathing and dressing tasks.  Pt exhibited some apraxia with bathing tasks and required demonstration cues and min HOH to initiate tasks.  Pt educated on hemi bthing and dressing techniques.  Pt erquired max A for initiating sit<>stand and min A for standing balance to pull up pants.  Pt initiated pulling up pants but required assistance to complete task.  Focus on activity tolerance, task initiation, sequencing, sit<>stand, standing balance, hemi techniques, functional transfers, family education, and safety awareness.  Therapy Documentation Precautions:  Precautions Precautions: Fall Precaution Comments: global aphasia,  aspiration risk, dense R hemiplegia Restrictions Weight Bearing Restrictions: No   Pain: Pain Assessment Pain Assessment: Faces Faces Pain Scale: Hurts little more Pain Type: Acute pain Pain Location: Shoulder Pain Orientation: Right Pain Onset: With Activity Patients Stated Pain Goal: 0 Pain Intervention(s): RN made aware;Repositioned ADL:   Exercises:   Other Treatments:    See FIM for current functional status  Therapy/Group: Individual Therapy  Rich BraveLanier, Kullen Tomasetti Chappell 10/26/2014, 10:04 AM

## 2014-10-26 NOTE — Progress Notes (Signed)
39 year old right handed male with history of hypertension maintained on hydrochlorothiazide. 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 He presented to [facility] 10/19/2014 with acute onset of right sided weakness, headache and inability to speak. Cranial CT scan negative. MRI of the head showed moderate volume of patchy acute infarct left MCA territory. Most confluent involvement at the corona radiata and lentiform nuclei. No mass effect. Carotid Dopplers with no ICA stenosis. 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 Collateral supply of left MCA branch vessels decreased in number and caliber in the area of acute infarct. Carotid Dopplers with ejection fraction of 65% without emboli Subjective/Complaints: Remains severely aphasic, per wife he did say hay when friends visited yesterday Wife and father in room  Review of Systems - unable to obtain secondary to severe aphasia  Objective: Vital Signs: Blood pressure 127/77, pulse 64, temperature 98.2 F (36.8 C), temperature source Oral, resp. rate 18, height 6' (1.829 m), weight 102.9 kg (226 lb 13.7 oz), SpO2 97 %. No results found. No results found for this or any previous visit (from the past 72 hour(s)).   HEENT: normal Cardio: RRR and no murmur Resp: CTA B/L and unlabored GI: BS positive and NT.ND Extremity:  Edema Mild Right Hand Skin:   Intact Neuro: Alert/Oriented, Abnormal Sensory Reduced sensation right lower extremity greater than right upper extremity, Abnormal Motor 0/5 on muscle testing in the right upper and right lower extremity,  slight extension to pinch in the right upper, Aphasic and Other difficult to evaluate visual fields secondary to severe aphasia, may have some right inattention Patient appears to have apraxia, has more spontaneous movement during therapy Musc/Skel:  Other no pain with upper extremity or lower extremity active and passive range of motion Gen. no acute  distress   Assessment/Plan: 1. 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 Physiatrist is providing close team supervision and 24 hour management of active medical problems listed below. Physiatrist and rehab team continue to assess barriers to discharge/monitor patient progress toward functional and medical goals.  FIM: FIM - Bathing Bathing Steps Patient Completed: Chest, Abdomen, Front perineal area, Right upper leg, Left upper leg Bathing: 3: Mod-Patient completes 5-7 78f 10 parts or 50-74%  FIM - Upper Body Dressing/Undressing Upper body dressing/undressing steps patient completed: Thread/unthread left sleeve of pullover shirt/dress, Thread/unthread right sleeve of pullover shirt/dresss Upper body dressing/undressing: 3: Mod-Patient completed 50-74% of tasks FIM - Lower Body Dressing/Undressing Lower body dressing/undressing steps patient completed: Thread/unthread left pants leg, Don/Doff left shoe Lower body dressing/undressing: 1: Total-Patient completed less than 25% of tasks     FIM - Diplomatic Services operational officer Devices: Grab bars Toilet Transfers: 4-To toilet/BSC: Min A (steadying Pt. > 75%), 4-From toilet/BSC: Min A (steadying Pt. > 75%)  FIM - Bed/Chair Transfer Bed/Chair Transfer Assistive Devices: Arm rests, Bed rails Bed/Chair Transfer: 3: Supine > Sit: Mod A (lifting assist/Pt. 50-74%/lift 2 legs, 3: Bed > Chair or W/C: Mod A (lift or lower assist), 3: Chair or W/C > Bed: Mod A (lift or lower assist)  FIM - Locomotion: Wheelchair Distance: 75 Locomotion: Wheelchair: 5: Travels 150 ft or more: maneuvers on rugs and over door sills with supervision, cueing or coaxing FIM - Locomotion: Ambulation Locomotion: Ambulation Assistive Devices: Walker - Hemi (L hand rail) Ambulation/Gait Assistance: 3: Mod  assist Locomotion: Ambulation:  1: Two helpers  Comprehension Comprehension Mode: Auditory Comprehension: 2-Understands basic 25 - 49% of the time/requires cueing 51 - 75% of the time  Expression Expression Mode: Verbal Expression: 1-Expresses basis less than 25% of the time/requires cueing greater than 75% of the time.  Social Interaction Social Interaction: 2-Interacts appropriately 25 - 49% of time - Needs frequent redirection.  Problem Solving Problem Solving: 1-Solves basic less than 25% of the time - needs direction nearly all the time or does not effectively solve problems and may need a restraint for safety  Memory Memory: 1-Recognizes or recalls less than 25% of the time/requires cueing greater than 75% of the time  Medical Problem List and Plan: 1. Functional deficits secondary to left MCA infarct,aphasia and apraxia as well 2. DVT Prophylaxis/Anticoagulation: Subcutaneous Lovenox. Monitor platelet counts and any signs of bleeding 3. Pain Management: Tylenol as needed 4. Dysphagia. Dysphagia 1 honey liquids. Monitor hydration, encourage po 5. Neuropsych: This patient is capable of making decisions on his own behalf. 6. Skin/Wound Care: Routine skin checks 7. Fluids/Electrolytes/Nutrition: Routine eye and nose with follow-up chemistries 8. Hypertension. No current antihypertensive medication. Patient on hydrochlorothiazide 12.5 mg daily prior to admission. Monitor with increased mobility  LOS (Days) 5 A FACE TO FACE EVALUATION WAS PERFORMED  KIRSTEINS,ANDREW E 10/26/2014, 8:37 AM

## 2014-10-26 NOTE — Progress Notes (Signed)
Speech Language Pathology Daily Session Note  Patient Details  Name: Mario [name] MRN: [id number] Date of Birth: [birth year]  Today's Date: 10/26/2014 SLP Individual Time: 1300-1400; 1500-1530 SLP Individual Time Calculation (min): 60 min; 30 min   Short Term Goals: Week 1: SLP Short Term Goal 1 (Week 1): Pt will answer basic, immediate, biographical and/or environmental yes/no questions with max assist multimodal cuing.   SLP Short Term Goal 2 (Week 1): Pt will follow 1-step directions in the context of a basic, familiar task with mod-max assist multimodal cuing.  SLP Short Term Goal 3 (Week 1): Pt will initiate vocalizations to produce vowels and/or CV syllables with max assist multimodal cuing.  SLP Short Term Goal 4 (Week 1): Pt will return demonstration of the call bell with max assist multimodal cuing.  SLP Short Term Goal 5 (Week 1): Pt will consume his currently presecribed diet with min assist verbal cues for use of rate and portion control, liquid wash, lingual sweep, and manual assist to monitor and correct right sided buccal residue.   Skilled Therapeutic Interventions:  Session 1: Pt was seen for skilled ST targeting goals for functional communication and dysphagia.  Upon arrival, pt was seated upright in wheelchair, father present in room.  SLP facilitated the session with skilled observations completed during presentations of pt's currently prescribed diet.  Pt required mod assist verbal and tactile cues for use of rate and portion control to manage oral residue and prevent pocketing.  No overt s/s of aspiration were evident with liquids or purees.  Pt with poor PO intake of purees despite encouragement to eat; however, he did consume 8 oz of honey thick liquids.  Pt matched objects to words from a field of three for 90% accuracy with min assist verbal cues.  Pt returned demonstration of diaphragmatic breathing exercises with manipulatives x15 (min verbal cues) to facilitate  coordination of airflow through the upper airway for initiation of phonation.  Pt was able to achieve brief, intermittent, hoarse, breathy quality phonation during structured vowel repetition practice.  Pt was also able to approximate initial consonants for 33% accuracy during a short, automatic counting task (1-3) with max assist multimodal cues and repetition of trials.  Pt left upright in wheelchair, father present, call bell left within reach.   Session 2:  Pt was seen for skilled ST targeting goals for functional communication.  Pt was received upright in bathroom having just completed toileting with nursing.  Pt required hand over hand assist to initiate and sequence hand hygiene.  SLP facilitated the session with trials of matching object to phrase given pt's success with matching word to picture.  Pt was total assist during the abovementioned task so trials were abandoned.  Pt also required max assist multimodal cues for awareness of verbal errors (perseveration) during structured vowel repetition practice.  Pt again was briefly and intermittently able to achieve hoarse, breathy voice with decreased vocal intensity with max assist multimodal cues.  Pt was left upright in wheelchair with father in room.  Call bell left within reach.  Continue per current plan of care.    FIM:  Comprehension Comprehension Mode: Auditory Comprehension: 2-Understands basic 25 - 49% of the time/requires cueing 51 - 75% of the time Expression Expression Mode: Verbal Expression: 1-Expresses basis less than 25% of the time/requires cueing greater than 75% of the time. Social Interaction Social Interaction: 2-Interacts appropriately 25 - 49% of time - Needs frequent redirection. Problem Solving Problem Solving: 1-Solves basic less than  25% of the time - needs direction nearly all the time or does not effectively solve problems and may need a restraint for safety Memory Memory: 1-Recognizes or recalls less than 25% of  the time/requires cueing greater than 75% of the time FIM - Eating Eating Activity: 5: Supervision/cues;4: Helper checks for pocketed food;4: Help with picking up utensils  Pain Pain Assessment (session 1)   Pain Assessment: Faces Pain Score: 0-No pain Pain Assessment (session 2) Pain Assessment: Faces Pain Score: 0-No pain   Therapy/Group: Individual Therapy  Vashon Arch, Melanee SpryNicole L 10/26/2014, 4:11 PM

## 2014-10-26 NOTE — Progress Notes (Signed)
Physical Therapy Session Note  Patient Details  Name: Mellody DrownJerome A Stahle MRN: [id number] Date of Birth: [birth year]  Today's Date: 10/26/2014 PT Individual Time: 1030-1155 PT Individual Time Calculation (min): 85 min   Short Term Goals: Week 1:  PT Short Term Goal 1 (Week 1): Pt will perform bed mobility with HOB flat and without rails at min A level with 50% cues to attend to RUE/LE.  PT Short Term Goal 2 (Week 1): Pt will perform functional transfers R and L at mod A level with 50% cues to attend to RUE/LE PT Short Term Goal 3 (Week 1): Pt will perform dynamic standing activity x 3 mins at mod A level and single UE support PT Short Term Goal 4 (Week 1): Pt will self propel w/c x 100' using L hemi technique at min A level with 50% cues to attend to the R.   PT Short Term Goal 5 (Week 1): Pt will ambulate x 25' w/ LRAD and max A with +2A for chair follow  Skilled Therapeutic Interventions/Progress Updates:   Skilled session focused on w/c propulsion using L hemi technique at S to min A level x 100' x 1, and 200' x 1 to improve technique and to increase attention to R environment, address postural control and truncal NMR as well as RUE/LE NMR via tall kneeling, quadruped, gait and sit<>stand, sit<>mini stand transfers, functional use of LUE with HOH assist and dynamic standing tasks to reduce pusher tendencies. Requires +2A during tall kneeling, quadruped and gait activities with facilitation at trunk for upright posture, hip for increased anterior pelvic tilt and increased glute activation, assist to advance RLE as well as stabilize in stance phase of gait and provide forward translation over LLE during stance phase of gait.  Note that during gait, donned R blue rocker AFO with surgical shoe cover and once progressed to L handrail, pt able to advance RLE on his own with little compensation with light facilitation at trunk.  Ended session with dynamic standing tasks reaching to the L and R to increase WB  and also to decrease pusher tendencies.  Ended session with seated tasks providing HOH assist for RUE to reach for task with focus on posture, forward flexion at hips and improved movement at RUE to decrease compensatory strategies.  Pt propelled back to room and left in room with all needs in reach and father present.     Therapy Documentation Precautions:  Precautions Precautions: Fall Precaution Comments: global aphasia, aspiration risk, dense R hemiplegia Restrictions Weight Bearing Restrictions: No   Pain: Pain Assessment Pain Assessment: Faces Faces Pain Scale: Hurts little more Pain Type: Acute pain Pain Location: Shoulder Pain Orientation: Right Pain Descriptors / Indicators: Tingling Pain Frequency: Intermittent Pain Onset: With Activity Patients Stated Pain Goal: 0 Pain Intervention(s): RN made aware;Repositioned  See FIM for current functional status  Therapy/Group: Individual Therapy  Vista Deckarcell, Winferd Wease Ann 10/26/2014, 11:33 AM

## 2014-10-27 ENCOUNTER — Inpatient Hospital Stay (HOSPITAL_COMMUNITY): Payer: BC Managed Care – PPO | Admitting: Rehabilitation

## 2014-10-27 ENCOUNTER — Inpatient Hospital Stay (HOSPITAL_COMMUNITY): Payer: BC Managed Care – PPO | Admitting: Speech Pathology

## 2014-10-27 ENCOUNTER — Inpatient Hospital Stay (HOSPITAL_COMMUNITY): Payer: BC Managed Care – PPO

## 2014-10-27 ENCOUNTER — Ambulatory Visit (HOSPITAL_COMMUNITY): Payer: BC Managed Care – PPO | Admitting: Rehabilitation

## 2014-10-27 NOTE — Progress Notes (Signed)
Physical Therapy Session Note  Patient Details  Name: Mario DrownJerome A Andaya MRN: [id number] Date of Birth: [birth year]  Today's Date: 10/27/2014 PT Individual Time: 1415-1515 PT Individual Time Calculation (min): 60 min   Short Term Goals: Week 1:  PT Short Term Goal 1 (Week 1): Pt will perform bed mobility with HOB flat and without rails at min A level with 50% cues to attend to RUE/LE.  PT Short Term Goal 2 (Week 1): Pt will perform functional transfers R and L at mod A level with 50% cues to attend to RUE/LE PT Short Term Goal 3 (Week 1): Pt will perform dynamic standing activity x 3 mins at mod A level and single UE support PT Short Term Goal 4 (Week 1): Pt will self propel w/c x 100' using L hemi technique at min A level with 50% cues to attend to the R.   PT Short Term Goal 5 (Week 1): Pt will ambulate x 25' w/ LRAD and max A with +2A for chair follow  Skilled Therapeutic Interventions/Progress Updates:   Skilled session focused on dynamic standing tasks to improve postural control, balance, NMR through RLE w/ increased WB and weight shift through RLE, increased glute activation, increased weight shift to the L to decrease pusher tendencies.  Performed fishing task with stepping forwards and retro stepping with +2 A.  Pt demonstrating improvement with tasks with more repetition.  Addressed several reaching tasks to the L to decrease pusher tendencies and also with RLE propped on mat, however requires multiple repetitions to begin to see successful weight shift to the L.  Ended session with standing task pushing ball up wall with LUE and holding for sustained activation of L weight shift.  Again, requires +2 A for all dynamic standing tasks.  Pt assisted back to room and left in w/c with all needs in reach.   FYI:  Per RN report, pts father has been transferring pt in room, therefore provided max education on safety concerns as pt is very high risk for fall and is requiring increased assist at this  time and will need to call for assist to prevent fall and injury.  Pt and father verbalized understanding.   Therapy Documentation Precautions:  Precautions Precautions: Fall Precaution Comments: global aphasia, aspiration risk, dense R hemiplegia Restrictions Weight Bearing Restrictions: No   Vital Signs: Therapy Vitals Temp: 99.2 F (37.3 C) (RN notified) Temp Source: Oral Pulse Rate: 73 Resp: 18 BP: (!) 131/91 mmHg Patient Position (if appropriate): Sitting Oxygen Therapy SpO2: 96 % O2 Device: Not Delivered Pain: Pt with no s/s of pain during session.    Locomotion : Ambulation Ambulation/Gait Assistance: 1: +2 Total assist   See FIM for current functional status  Therapy/Group: Co-Treatment w/ RT  Vista Deckarcell, Astha Probasco Ann 10/27/2014, 4:33 PM

## 2014-10-27 NOTE — Progress Notes (Signed)
Physical Therapy Session Note  Patient Details  Name: Mario [name] MRN: [id number] Date of Birth: 1975/12/27  Today's Date: 10/27/2014 PT Individual Time: 1030-1100 PT Individual Time Calculation (min): 30 min   Short Term Goals: Week 1:  PT Short Term Goal 1 (Week 1): Pt will perform bed mobility with HOB flat and without rails at min A level with 50% cues to attend to RUE/LE.  PT Short Term Goal 2 (Week 1): Pt will perform functional transfers R and L at mod A level with 50% cues to attend to RUE/LE PT Short Term Goal 3 (Week 1): Pt will perform dynamic standing activity x 3 mins at mod A level and single UE support PT Short Term Goal 4 (Week 1): Pt will self propel w/c x 100' using L hemi technique at min A level with 50% cues to attend to the R.   PT Short Term Goal 5 (Week 1): Pt will ambulate x 25' w/ LRAD and max A with +2A for chair follow  Skilled Therapeutic Interventions/Progress Updates:   Pt received lying in bed, having just finished SLP session, agreeable to PT session.  Pt impulsive to get to EOB, therefore provided max verbal cues and had pt lie flat with HOB flat and without rails in order to perform bed mobility to better simulate home environment.  Requires heavy tactile and verbal cues to perform rolling and lowering BLEs out of bed, however elevated trunk at min A level and more automatically.  Remainder of session focused on gait via "three musketeer style" with +2A in order to better facilitate more automatic tasks due to apraxia, facilitate improved postural control, forward translation over RLE during gait, stabilization of R knee during stance, focus on leading gait with LEs instead of trunk and increased activation of RLE to advance during swing and glute activation/hip protraction.  Performed 6475' at slow pace to further focus on tasks mentioned above.  Continues to have difficulty with L weight shift due to pusher tendencies, despite max verbal and demonstration cues.   Pt assisted back to w/c and back to room.  Left in w/c with quick release belt donned and all needs in reach.   Therapy Documentation Precautions:  Precautions Precautions: Fall Precaution Comments: global aphasia, aspiration risk, dense R hemiplegia Restrictions Weight Bearing Restrictions: No   Pain: Pain Assessment Pain Assessment: No/denies pain   Locomotion : Ambulation Ambulation/Gait Assistance: 1: +2 Total assist   See FIM for current functional status  Therapy/Group: Individual Therapy  Vista Deckarcell, Olivene Cookston Ann 10/27/2014, 12:44 PM

## 2014-10-27 NOTE — Plan of Care (Signed)
Problem: RH BLADDER ELIMINATION Goal: RH STG MANAGE BLADDER WITH ASSISTANCE STG Manage Bladder With Assistance. Mod I  Outcome: Progressing No incontinent episode reported     

## 2014-10-27 NOTE — Progress Notes (Signed)
Recreational Therapy Assessment and Plan  Patient Details  Name: Mario [name] MRN: [id number] Date of Birth: [birth year] Today's Date: 10/27/2014  Rehab Potential: Good ELOS: 4 weeks  Assessment Clinical Impression:  Problem List:  Patient Active Problem List   Diagnosis Date Noted  . 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 Right hemiparesis 10/21/2014  . 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 Hypertension    Past Surgical History:  Past Surgical History  Procedure Laterality Date  . No past surgeries      Assessment & Plan Clinical Impression: Patient is a 39 y.o. year old male with history of hypertension maintained on hydrochlorothiazide. 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 He presented to [facility]/19/2014 with acute onset of right sided weakness, headache and inability to speak. Cranial CT scan negative. MRI of the head showed moderate volume of patchy acute infarct left MCA territory. Most confluent involvement at the corona radiata and lentiform nuclei. No mass effect. Carotid Dopplers with no ICA stenosis. 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 Collateral supply of left MCA branch vessels decreased in number and caliber in the area of acute infarct. Carotid Dopplers with ejection fraction of 65% without emboli. Patient did not receive TPA. Placed on aspirin for CVA prophylaxis. Subcutaneous Lovenox for DVT prophylaxis. Currently on a pured diet with honey thick liquids.. Patient transferred to CIR on 10/21/2014 .      Pt presents with decreased activity tolerance, decreased functional mobility, decreased balance, decreased vision, right inattention, decreased attention, decreased safety awareness, decreased awareness, decreased problem solving, global aphasia Limiting pt's independence with leisure/community pursuits.    Leisure History/Participation Premorbid leisure interest/current participation: Therapist, music - Fishing;Nature - Hunting;Community - Grocery store;Community - Designer, jewellery Other Leisure Interests: Television;Computer Psychosocial / Spiritual Spiritual Interests: Social worker Social interaction - Mood/Behavior: Cooperative Engineer, drilling for Education?: Yes Strengths/Weaknesses Patient Strengths/Abilities: Willingness to participate;Active premorbidly Patient weaknesses: Physical limitations TR Patient demonstrates impairments in the following area(s): Endurance;Motor;Perception;Safety  Plan Rec Therapy Plan Is patient appropriate for Therapeutic Recreation?: Yes Rehab Potential: Good Treatment times per week: Min 1 time per week >20 minutes TR Treatment/Interventions: Adaptive equipment instruction;1:1 session;Balance/vestibular training;Functional mobility training;Community reintegration;Cognitive remediation/compensation;Patient/family education;Therapeutic activities;Recreation/leisure participation;Therapeutic exercise;UE/LE Coordination activities;Visual/perceptual remediation/compensation;Wheelchair propulsion/positioning  Recommendations for other services: None  Discharge Criteria: Patient will be discharged from TR if patient refuses treatment 3 consecutive times without medical reason.  If treatment goals not met, if there is a change in medical status, if patient makes no progress towards goals or if patient is discharged from hospital.  The above assessment, treatment plan, treatment alternatives and goals were discussed and mutually agreed upon: by patient  Richardson 10/27/2014, 4:36 PM

## 2014-10-27 NOTE — Plan of Care (Signed)
Problem: RH BOWEL ELIMINATION Goal: RH STG MANAGE BOWEL WITH ASSISTANCE STG Manage Bowel with mod Assistance.  Outcome: Not Progressing No BM since 10/24/14. Meds given, no results

## 2014-10-27 NOTE — Progress Notes (Signed)
39 year old right handed male with history of hypertension maintained on hydrochlorothiazide. 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 He [facility] with acute onset of right sided weakness, headache and inability to speak. Cranial CT scan negative. MRI of the head showed moderate volume of patchy acute infarct left MCA territory. Most confluent involvement at the corona radiata and lentiform nuclei. No mass effect. Carotid Dopplers with no ICA stenosis. 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 Collateral supply of left MCA branch vessels decreased in number and caliber in the area of acute infarct. Carotid Dopplers with ejection fraction of 65% without emboli Subjective/Complaints: Father in room, patient sitting at edge of bed.  Review of Systems - unable to obtain secondary to severe aphasia  Objective: Vital Signs: Blood pressure 130/77, pulse 73, temperature 99.1 F (37.3 C), temperature source Oral, resp. rate 17, height 6' (1.829 m), weight 102.9 kg (226 lb 13.7 oz), SpO2 97 %. No results found. No results found for this or any previous visit (from the past 72 hour(s)).   HEENT: normal Cardio: RRR and no murmur Resp: CTA B/L and unlabored GI: BS positive and NT.ND Extremity:  Edema Mild Right Hand Skin:   Intact Neuro: Alert/Oriented, Abnormal Sensory Reduced sensation to pinch on the right side, Abnormal Motor 0/5 on muscle testing in the right upper and right lower extremity,  slight extension to pinch in the right upper, Aphasic and Other difficult to evaluate visual fields secondary to severe aphasia, may have some right inattention Patient appears to have apraxia, has more spontaneous movement during therapy Musc/Skel:  Other no pain with upper extremity or lower extremity active and passive range of motion Gen. no acute distress   Assessment/Plan: 1. 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 Physiatrist is providing close team supervision and 24 hour management of active medical problems listed below. Physiatrist and rehab team continue to assess barriers to discharge/monitor patient progress toward functional and medical goals.  FIM: FIM - Bathing Bathing Steps Patient Completed: Chest, Right Arm, Right upper leg, Left upper leg, Front perineal area, Abdomen Bathing: 3: Mod-Patient completes 5-7 5034f 10 parts or 50-74%  FIM - Upper Body Dressing/Undressing Upper body dressing/undressing steps patient completed: Thread/unthread left sleeve of pullover shirt/dress, Put head through opening of pull over shirt/dress Upper body dressing/undressing: 3: Mod-Patient completed 50-74% of tasks FIM - Lower Body Dressing/Undressing Lower body dressing/undressing steps patient completed: Thread/unthread left pants leg, Don/Doff left shoe Lower body dressing/undressing: 1: Total-Patient completed less than 25% of tasks  FIM - Toileting Toileting steps completed by patient: Adjust clothing prior to toileting Toileting Assistive Devices: Grab bar or rail for support Toileting: 2: Max-Patient completed 1 of 3 steps  FIM - Diplomatic Services operational officerToilet Transfers Toilet Transfers Assistive Devices: Grab bars Toilet Transfers: 4-To toilet/BSC: Min A (steadying Pt. > 75%), 4-From toilet/BSC: Min A (steadying Pt. > 75%)  FIM - Bed/Chair Transfer Bed/Chair Transfer Assistive Devices: Arm rests, Bed rails Bed/Chair Transfer: 2: Bed > Chair or W/C: Max A (lift and lower assist), 2: Chair or W/C > Bed: Max A (lift and lower assist) (w/c<>mat)  FIM - Locomotion: Wheelchair Distance: 100' Locomotion: Wheelchair: 2: Travels 50 - 149 ft with minimal assistance (Pt.>75%) FIM - Locomotion: Ambulation Locomotion: Ambulation Assistive Devices: Other (comment) (three muskateer  style) Ambulation/Gait Assistance: 1: +2 Total  assist Locomotion: Ambulation: 1: Two helpers  Comprehension Comprehension Mode: Auditory Comprehension: 2-Understands basic 25 - 49% of the time/requires cueing 51 - 75% of the time  Expression Expression Mode: Verbal Expression: 1-Expresses basis less than 25% of the time/requires cueing greater than 75% of the time.  Social Interaction Social Interaction: 2-Interacts appropriately 25 - 49% of time - Needs frequent redirection.  Problem Solving Problem Solving: 1-Solves basic less than 25% of the time - needs direction nearly all the time or does not effectively solve problems and may need a restraint for safety  Memory Memory: 1-Recognizes or recalls less than 25% of the time/requires cueing greater than 75% of the time  Medical Problem List and Plan: 1. Functional deficits secondary to left MCA infarct,aphasia and apraxia as well 2. DVT Prophylaxis/Anticoagulation: Subcutaneous Lovenox. Monitor platelet counts and any signs of bleeding 3. Pain Management: Tylenol as needed 4. Dysphagia. Dysphagia 1 honey liquids. Monitor hydration, encourage po 5. Neuropsych: This patient is capable of making decisions on his own behalf. 6. Skin/Wound Care: Routine skin checks 7. Fluids/Electrolytes/Nutrition: Routine eye and nose with follow-up chemistries 8. Hypertension. No current antihypertensive medication. Patient on hydrochlorothiazide 12.5 mg daily prior to admission. Monitor with increased mobility  LOS (Days) 6 A FACE TO FACE EVALUATION WAS PERFORMED  Reymond Maynez E 10/27/2014, 8:50 AM

## 2014-10-27 NOTE — Progress Notes (Signed)
Occupational Therapy Session Note  Patient Details  Name: Mario DrownJerome A Mancil MRN: [id number] Date of Birth: 1975/09/17  Today's Date: 10/27/2014 OT Individual Time: 1115-1200 OT Individual Time Calculation (min): 45 min    Short Term Goals: Week 1:  OT Short Term Goal 1 (Week 1): Pt will perform LB dressing with Max A and mod A standing balance during task. OT Short Term Goal 2 (Week 1): Pt will perform toilet transfer with Mod A stand pivot in order to increase I with functional transfer. OT Short Term Goal 3 (Week 1): Pt will perform Mod A shower transfer onto TTB in order to decrease assistance needed with functional transfer.  OT Short Term Goal 4 (Week 1): Pt will utilize hand over hand technique for bathing with min cues in order to increase functional involvement of hemiplegic side.   Skilled Therapeutic Interventions/Progress Updates:    Pt resting in w/c upon arrival.  Pt dressed in clean clothing, per PT from earlier session.  Pt transitioned to therapy gym for RUE NMR including weight bearing through RUE while reaching for objects with LUE.  Pt required mod multimodal cues to initiate task.  Pt engaged in facilitated shoulder flexion/extension.  Pt's left thumb exhibited trace adduction while attempting to initiate shoulder flexion with hand resting on 2 legged stool.  Pt performed SPT transfer X 4 with mod A/min A.  Focus on functional transfers, task initiation, RUE NMR, and safety awareness.  Therapy Documentation Precautions:  Precautions Precautions: Fall Precaution Comments: global aphasia, aspiration risk, dense R hemiplegia Restrictions Weight Bearing Restrictions: No   Pain: Pain Assessment Pain Assessment: No/denies pain  See FIM for current functional status  Therapy/Group: Individual Therapy  Rich BraveLanier, Earl Losee Chappell 10/27/2014, 12:09 PM

## 2014-10-27 NOTE — Progress Notes (Signed)
Speech Language Pathology Daily Session Note  Patient Details  Name: Mario [name] MRN: [id number] Date of Birth: 1975-09-17  Today's Date: 10/27/2014 SLP Individual Time: 0930-1030 SLP Individual Time Calculation (min): 60 min  Short Term Goals: Week 1: SLP Short Term Goal 1 (Week 1): Pt will answer basic, immediate, biographical and/or environmental yes/no questions with max assist multimodal cuing.   SLP Short Term Goal 1 - Progress (Week 1): Progressing toward goal SLP Short Term Goal 2 (Week 1): Pt will follow 1-step directions in the context of a basic, familiar task with mod-max assist multimodal cuing.  SLP Short Term Goal 2 - Progress (Week 1): Progressing toward goal SLP Short Term Goal 3 (Week 1): Pt will initiate vocalizations to produce vowels and/or CV syllables with max assist multimodal cuing.  SLP Short Term Goal 3 - Progress (Week 1): Progressing toward goal SLP Short Term Goal 4 (Week 1): Pt will return demonstration of the call bell with max assist multimodal cuing.  SLP Short Term Goal 4 - Progress (Week 1): Progressing toward goal SLP Short Term Goal 5 (Week 1): Pt will consume his currently presecribed diet with min assist verbal cues for use of rate and portion control, liquid wash, lingual sweep, and manual assist to monitor and correct right sided buccal residue.  SLP Short Term Goal 5 - Progress (Week 1): Progressing toward goal  Skilled Therapeutic Interventions: Pt was seen for skilled ST targeting goals for functional communication and dysphagia. Pt was resting in bed, father in attendance initially but left to run errands. SLP facilitated the session with skilled observation of advanced solid consistency. Pt was given small bites of cracker by SLP to manage rate and portion control. Pt exhibited adequate mastication of multiple presentations, and did not exhibit pocketing or other oral residue. No overt s/s aspiration observed with cracker trials.  Will advance  diet to dys 2/ honey thick liquids, monitoring for tolerance and readiness to advance further.Pt returned demonstration of diaphragmatic breathing with visual and verbal cues to breathe in slowly, hold breath, then exhale slowly. Pt noted to be perseverative on breathing tasks after SLP switched to another therapeutic activity.  Pt was able to achieve brief intermittent hoarse vocalizations 5/10 trials, despite verbal encouragement and tactile cues. Pt was encouraged to practice "yawn-sigh" as able to facilitate vocalization. SLP reviewed and practiced articulatory positions for apraxia therapy.  Pt was able to return demonstration for "tongue tip up" (t,d,l) consistently, but was unable to demonstrate any of the other 8 positions.  Pt was noted to be aware of anterior leakage of saliva, and wiped the corner of his mouth intermittently throughout session.    FIM:  Comprehension Comprehension Mode: Auditory Comprehension: 2-Understands basic 25 - 49% of the time/requires cueing 51 - 75% of the time Expression Expression Mode: Verbal Expression: 1-Expresses basis less than 25% of the time/requires cueing greater than 75% of the time. Social Interaction Social Interaction: 2-Interacts appropriately 25 - 49% of time - Needs frequent redirection. Problem Solving Problem Solving: 1-Solves basic less than 25% of the time - needs direction nearly all the time or does not effectively solve problems and may need a restraint for safety Memory Memory: 1-Recognizes or recalls less than 25% of the time/requires cueing greater than 75% of the time FIM - Eating Eating Activity: 5: Supervision/cues;4: Helper checks for pocketed food  Pain Pain Assessment Pain Assessment: No/denies pain  Therapy/Group: Individual Therapy   Mario [name], [facility] High[facility] LakesMSP, CCC-SLP [phone]  Mario [name], Mario  Manson Passey 10/27/2014, 12:33 PM

## 2014-10-28 ENCOUNTER — Inpatient Hospital Stay (HOSPITAL_COMMUNITY): Payer: BC Managed Care – PPO | Admitting: Rehabilitation

## 2014-10-28 ENCOUNTER — Inpatient Hospital Stay (HOSPITAL_COMMUNITY): Payer: BC Managed Care – PPO

## 2014-10-28 ENCOUNTER — Inpatient Hospital Stay (HOSPITAL_COMMUNITY): Payer: BC Managed Care – PPO | Admitting: Occupational Therapy

## 2014-10-28 NOTE — Progress Notes (Signed)
Recreational Therapy Session Note  Patient Details  Name: Mario DrownJerome A [name] MRN: [id number] Date of Birth: 10-24-1975 Today's Date: 10/28/2014  Pain: no c/o Skilled Therapeutic Interventions/Progress Updates: Session focused on activity tolerance, dynamic standing balance, weight-shifting while during co-treat with PT.  Session occurred outside on grass surface with Min- mod assist for dynamic standing balance during bocce game reaching up and to the left.  Therapy/Group: Co-Treatment   [name] 10/28/2014, 12:10 PM

## 2014-10-28 NOTE — Progress Notes (Signed)
Occupational Therapy Session Note  Patient Details  Name: Mario DrownJerome A Andersson MRN: [id number] Date of Birth: Jan 23, 1976  Today's Date: 10/28/2014 OT Individual Time: 0800-0900 OT Individual Time Calculation (min): 60 min    Short Term Goals: Week 1:  OT Short Term Goal 1 (Week 1): Pt will perform LB dressing with Max A and mod A standing balance during task. OT Short Term Goal 2 (Week 1): Pt will perform toilet transfer with Mod A stand pivot in order to increase I with functional transfer. OT Short Term Goal 3 (Week 1): Pt will perform Mod A shower transfer onto TTB in order to decrease assistance needed with functional transfer.  OT Short Term Goal 4 (Week 1): Pt will utilize hand over hand technique for bathing with min cues in order to increase functional involvement of hemiplegic side.   Skilled Therapeutic Interventions/Progress Updates:    Pt engaged in BADL retraining including bathing at shower level and dressing with sit<>stand from chair.  Pt requires min multimodal cues to initiate all bathing and dressing tasks with occasional HHA to demonstrate.  Continued education on hemi bathing/dressing techniques.  Pt performs sit<>stand in shower with grab bars requiring min A to facilitate therapist bathing buttocks.  Pt responds to one step commands with min multimodal cues.  Pt attempted to pull up pants while standing but required min A to pull up on right side.  Focus on activity tolerance, sit<>stand, standing balance, functional transfers, following one step commands, task initiation, sequencing, and safety awareness.  Therapy Documentation Precautions:  Precautions Precautions: Fall Precaution Comments: global aphasia, aspiration risk, dense R hemiplegia Restrictions Weight Bearing Restrictions: No   Pain: Pain Assessment Pain Assessment: Faces Pain Score: 2  Pain Type: Acute pain Pain Location: Shoulder Pain Orientation: Right Pain Descriptors / Indicators:  Discomfort;Grimacing Pain Onset: With Activity Pain Intervention(s): RN made aware;Repositioned  See FIM for current functional status  Therapy/Group: Individual Therapy  Rich BraveLanier, Yousuf Ager Chappell 10/28/2014, 9:00 AM

## 2014-10-28 NOTE — Progress Notes (Signed)
39 year old right handed male with history of hypertension maintained on hydrochlorothiazide. 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 He presented to [facility] 10/19/2014 with acute onset of right sided weakness, headache and inability to speak. Cranial CT scan negative. MRI of the head showed moderate volume of patchy acute infarct left MCA territory. Most confluent involvement at the corona radiata and lentiform nuclei. No mass effect. Carotid Dopplers with no ICA stenosis. 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 Collateral supply of left MCA branch vessels decreased in number and caliber in the area of acute infarct. Carotid Dopplers with ejection fraction of 65% without emboli Subjective/Complaints: No issues overnite Dad in room  Review of Systems - unable to obtain secondary to severe aphasia  Objective: Vital Signs: Blood pressure 110/74, pulse 68, temperature 98.1 F (36.7 C), temperature source Oral, resp. rate 18, height 6' (1.829 m), weight 102.9 kg (226 lb 13.7 oz), SpO2 98 %. No results found. No results found for this or any previous visit (from the past 72 hour(s)).   HEENT: normal Cardio: RRR and no murmur Resp: CTA B/L and unlabored GI: BS positive and NT.ND Extremity:  Edema Mild Right Hand Skin:   Intact Neuro: Alert/Oriented, Abnormal Sensory Reduced sensation to pinch on the right side, Abnormal Motor 0/5 on muscle testing in the right upper and right lower extremity,  slight extension to pinch in the right upper, Aphasic and Other difficult to evaluate visual fields secondary to severe aphasia, may have some right inattention Patient appears to have apraxia, has more spontaneous movement during therapy Musc/Skel:  Other no pain with upper extremity or lower extremity active and passive range of motion Gen. no acute distress   Assessment/Plan: 1. Functional deficits secondary to left MCA infarct with severe right  hemiparesis, global aphasia which require 3+ hours per day of interdisciplinary therapy in a comprehensive inpatient rehab setting. Physiatrist is providing close team supervision and 24 hour management of active medical problems listed below. Physiatrist and rehab team continue to assess barriers to discharge/monitor patient progress toward functional and medical goals. Team conference today please see physician documentation under team conference tab, met with team face-to-face to discuss problems,progress, and goals. Formulized individual treatment plan based on medical history, underlying problem and comorbidities. Discuss careteam mtg with Dad FIM: FIM - Bathing Bathing Steps Patient Completed: Chest, Right Arm, Abdomen, Front perineal area, Right upper leg, Left upper leg Bathing: 3: Mod-Patient completes 5-7 78f 10 parts or 50-74%  FIM - Upper Body Dressing/Undressing Upper body dressing/undressing steps patient completed: Thread/unthread left sleeve of pullover shirt/dress, Put head through opening of pull over shirt/dress Upper body dressing/undressing: 3: Mod-Patient completed 50-74% of tasks FIM - Lower Body Dressing/Undressing Lower body dressing/undressing steps patient completed: Thread/unthread left pants leg, Don/Doff left shoe Lower body dressing/undressing: 2: Max-Patient completed 25-49% of tasks  FIM - Toileting Toileting steps completed by patient: Adjust clothing prior to toileting Toileting Assistive Devices: Grab bar or rail for support Toileting: 2: Max-Patient completed 1 of 3 steps  FIM - Radio producer Devices: Grab bars Toilet Transfers: 4-To toilet/BSC: Min A (steadying Pt. > 75%), 4-From toilet/BSC: Min A (steadying Pt. > 75%)  FIM - Bed/Chair Transfer Bed/Chair Transfer Assistive Devices: Arm rests, Bed rails Bed/Chair Transfer: 2: Supine > Sit: Max A (lifting assist/Pt. 25-49%)  FIM - Locomotion: Wheelchair Distance:  100' Locomotion: Wheelchair: 0: Activity did not occur FIM - Locomotion: Ambulation Locomotion: Ambulation Assistive Devices:  Other (comment) (three muskateer style) Ambulation/Gait Assistance: 1: +2 Total assist Locomotion: Ambulation: 1: Two helpers  Comprehension Comprehension Mode: Auditory Comprehension: 3-Understands basic 50 - 74% of the time/requires cueing 25 - 50%  of the time  Expression Expression Mode: Nonverbal Expression: 1-Expresses basis less than 25% of the time/requires cueing greater than 75% of the time.  Social Interaction Social Interaction: 2-Interacts appropriately 25 - 49% of time - Needs frequent redirection.  Problem Solving Problem Solving: 3-Solves basic 50 - 74% of the time/requires cueing 25 - 49% of the time  Memory Memory: 2-Recognizes or recalls 25 - 49% of the time/requires cueing 51 - 75% of the time  Medical Problem List and Plan: 1. Functional deficits secondary to left MCA infarct,aphasia and apraxia as well 2. DVT Prophylaxis/Anticoagulation: Subcutaneous Lovenox. Monitor platelet counts and any signs of bleeding 3. Pain Management: Tylenol as needed 4. Dysphagia. Dysphagia 1 honey liquids. Monitor hydration, encourage po 5. Neuropsych: This patient is capable of making decisions on his own behalf. 6. Skin/Wound Care: Routine skin checks 7. Fluids/Electrolytes/Nutrition: Routine eye and nose with follow-up chemistries 8. Hypertension. No current antihypertensive medication. Patient on hydrochlorothiazide 12.5 mg daily prior to admission. Monitor with increased mobility  LOS (Days) 7 A FACE TO FACE EVALUATION WAS PERFORMED  KIRSTEINS,ANDREW E 10/28/2014, 9:22 AM

## 2014-10-28 NOTE — Progress Notes (Signed)
Speech Language Pathology Daily Session Note  Patient Details  Name: Mario [name] MRN: [id number] Date of Birth: 1975-06-06  Today's Date: 10/28/2014 SLP Individual Time: [id number] SLP Individual Time Calculation (min): 28 min  Short Term Goals: Week 1: SLP Short Term Goal 1 (Week 1): Pt will answer basic, immediate, biographical and/or environmental yes/no questions with max assist multimodal cuing.   SLP Short Term Goal 1 - Progress (Week 1): Progressing toward goal SLP Short Term Goal 2 (Week 1): Pt will follow 1-step directions in the context of a basic, familiar task with mod-max assist multimodal cuing.  SLP Short Term Goal 2 - Progress (Week 1): Progressing toward goal SLP Short Term Goal 3 (Week 1): Pt will initiate vocalizations to produce vowels and/or CV syllables with max assist multimodal cuing.  SLP Short Term Goal 3 - Progress (Week 1): Progressing toward goal SLP Short Term Goal 4 (Week 1): Pt will return demonstration of the call bell with max assist multimodal cuing.  SLP Short Term Goal 4 - Progress (Week 1): Progressing toward goal SLP Short Term Goal 5 (Week 1): Pt will consume his currently presecribed diet with min assist verbal cues for use of rate and portion control, liquid wash, lingual sweep, and manual assist to monitor and correct right sided buccal residue.  SLP Short Term Goal 5 - Progress (Week 1): Progressing toward goal  Skilled Therapeutic Interventions: Skilled treatment focused on swallowing and communication goals. SLP facilitated session with skilled observation of Dys 2 textures and honey thick liquids per recommendations for diet advancement on previous date. Pt monitored for anterior loss with Mod I, however required Mod cues for pacing and adequate clearance of pocked foods. Delayed cough noted x1 across trials. While no phonation was achieved throughout session, pt was able to return demonstration labial rounding and anterior tongue elevation for  approximation of speech sounds.    FIM:  Comprehension Comprehension Mode: Auditory Comprehension: 3-Understands basic 50 - 74% of the time/requires cueing 25 - 50%  of the time Expression Expression Mode: Nonverbal Expression: 1-Expresses basis less than 25% of the time/requires cueing greater than 75% of the time. Social Interaction Social Interaction: 2-Interacts appropriately 25 - 49% of time - Needs frequent redirection. Problem Solving Problem Solving: 3-Solves basic 50 - 74% of the time/requires cueing 25 - 49% of the time Memory Memory: 2-Recognizes or recalls 25 - 49% of the time/requires cueing 51 - 75% of the time FIM - Eating Eating Activity: 5: Needs verbal cues/supervision;4: Helper checks for pocketed food  Pain Pain Assessment Pain Assessment: Faces Faces Pain Scale: No hurt  Therapy/Group: Individual Therapy   Maxcine HamLaura Paiewonsky, M.A. CCC-SLP [phone]  Maxcine Hamaiewonsky, Sean Macwilliams 10/28/2014, 1:25 PM

## 2014-10-28 NOTE — Progress Notes (Signed)
Physical Therapy Weekly Progress Note  Patient Details  Name: Mario [name] MRN: [id number] Date of Birth: 01/04/1976  Beginning of progress report period: Oct 22, 2014 End of progress report period: Oct 28, 2014  Today's Date: 10/28/2014 PT Individual Time: 1030-1115 PT Individual Time Calculation (min): 45 min   Patient has met 2 of 5 short term goals.  Pt making slow but steady progress during this reporting period.  He continues to be most limited by expressive and receptive aphasia and apraxia during all functional mobility.  He is very motivated and attempts all tasks given in therapy, however note that he is slightly impulsive and per RN has been transferring with father.  Provided max education to them both that it is not safe at this time for them to transfer due to deficits.    Patient continues to demonstrate the following deficits: decreased balance, aphasia, apraxia, decreased functional strength in RLE/UE and therefore will continue to benefit from skilled PT intervention to enhance overall performance with balance, postural control, ability to compensate for deficits, functional use of  right upper extremity and right lower extremity, attention, awareness, coordination and knowledge of precautions.  Patient progressing toward long term goals..  Continue plan of care.  PT Short Term Goals Week 1:  PT Short Term Goal 1 (Week 1): Pt will perform bed mobility with HOB flat and without rails at min A level with 50% cues to attend to RUE/LE.  PT Short Term Goal 1 - Progress (Week 1): Progressing toward goal PT Short Term Goal 2 (Week 1): Pt will perform functional transfers R and L at mod A level with 50% cues to attend to RUE/LE PT Short Term Goal 2 - Progress (Week 1): Progressing toward goal PT Short Term Goal 3 (Week 1): Pt will perform dynamic standing activity x 3 mins at mod A level and single UE support PT Short Term Goal 3 - Progress (Week 1): Progressing toward goal PT  Short Term Goal 4 (Week 1): Pt will self propel w/c x 100' using L hemi technique at min A level with 50% cues to attend to the R.   PT Short Term Goal 4 - Progress (Week 1): Met PT Short Term Goal 5 (Week 1): Pt will ambulate x 25' w/ LRAD and max A with +2A for chair follow PT Short Term Goal 5 - Progress (Week 1): Met Week 2:  PT Short Term Goal 1 (Week 2): Pt will perform bed mobility with HOB flat and without rails at min A level with 50% cues to attend to RUE/LE.  PT Short Term Goal 2 (Week 2): Pt will perform functional transfers R and L at mod A level with 50% cues to attend to RUE/LE PT Short Term Goal 3 (Week 2): Pt will perform dynamic standing activity x 3 mins at mod A level and single UE support PT Short Term Goal 4 (Week 2): Pt will ambulate x 25' w/ LRAD and mod A with +2A for chair follow for safety.   Skilled Therapeutic Interventions/Progress Updates:   Skilled co-treat with RT in order to address sit<>stand transfers, dynamic standing tolerance, reaching to the L to decrease pusher tendencies and focus on midline orientation and postural control during standing tasks with game of bocce.  Pt self propelled x 80' to family room to meet RT.  Pt agreeable to game of bocce outside, therefore assisted outside and set up w/c in grass for pt to stand in grassy area for increased  balance challenge.  Requires light mod A during dynamic standing tasks today vs previous sessions with improving ability to shift weight to the L during functional reaching tasks.  Progressed to having pt advance and retro step LLE for increased WB and glute activation in RLE during ball toss with +2A for safety.  Pt assisted back to room and left in room with quick release belt donned and all needs in reach.   Therapy Documentation Precautions:  Precautions Precautions: Fall Precaution Comments: global aphasia, aspiration risk, dense R hemiplegia Restrictions Weight Bearing Restrictions: No   Vital  Signs: Therapy Vitals Temp: 98.1 F (36.7 C) Temp Source: Oral Pulse Rate: 68 Resp: 18 BP: 110/74 mmHg Patient Position (if appropriate): Lying Oxygen Therapy SpO2: 98 % O2 Device: Not Delivered Pain: Pt with no s/s of pain during session.   See FIM for current functional status  Therapy/Group: Co-Treatment  Elio Haden, Betha Loa 10/28/2014, 8:14 AM

## 2014-10-28 NOTE — Progress Notes (Signed)
Occupational Therapy Session Note  Patient Details  Name: Mario DrownJerome A Baiza MRN: [id number] Date of Birth: 1975/07/06  Today's Date: 10/28/2014 OT Individual Time: 1300-1330 OT Individual Time Calculation (min): 30 min   Short Term Goals: Week 1:  OT Short Term Goal 1 (Week 1): Pt will perform LB dressing with Max A and mod A standing balance during task. OT Short Term Goal 2 (Week 1): Pt will perform toilet transfer with Mod A stand pivot in order to increase I with functional transfer. OT Short Term Goal 3 (Week 1): Pt will perform Mod A shower transfer onto TTB in order to decrease assistance needed with functional transfer.  OT Short Term Goal 4 (Week 1): Pt will utilize hand over hand technique for bathing with min cues in order to increase functional involvement of hemiplegic side.    Skilled Therapeutic Interventions/Progress Updates:  Patient found seated in w/c with dad present in room. Dad left for therapist to work with pt. Pt shook head when therapist asked if any pain. Pt propelled self to small gym, requiring up to max assist for right side attention so he didn't run into objects to his right. Assisted patient to small gym and patient engaged in table top NMR > RUE exercises. Therapist used paper and pen as a Public affairs consultantcommunication board. When asked to write his name, pt did this with success and no verbal cueing. When asked to write other orientation questions patient continued to write his name. Worked on writing word and having patient re-write word, this was successful 2/3 times. Pt followed one-step commands inconsistently. At end of session, left patient seated in w/c with quick release belt donned and all needs within reach.   Therapy Documentation Precautions:  Precautions Precautions: Fall Precaution Comments: global aphasia, aspiration risk, dense R hemiplegia Restrictions Weight Bearing Restrictions: No  See FIM for current functional status  Therapy/Group: Individual  Therapy  Britton Perkinson , MS, OTR/L, CLT Pager: [phone]   10/28/2014, 1:34 PM

## 2014-10-28 NOTE — Progress Notes (Signed)
Occupational Therapy Session Note  Patient Details  Name: Mario [name] MRN: [id number] Date of Birth: [birth year]  Today's Date: 10/28/2014 OT Individual Time: 1500-1531 OT Individual Time Calculation (min): 31 min    Skilled Therapeutic Interventions/Progress Updates:    Pt practiced use of the steady to assist with safe toilet transfers during beginning of the session.  He was able to perform sit to stand with the steady, using the LUE to pull up on the horizontal bar with min facilitation.  He was able to repeat this 3 times to simulate toileting tasks.  Feel this is the safest way for nursing to assist him with toileting, so safety plan has been updated.  Second part of session focused on functional reach using the RUE.  Pt currently total assist to integrate the RUE into simulated drinking task, which required forward shoulder flexion with elbow extension as well as gross digit flexion to grasp the cup.  Only trace movement noted in the right shoulder at this time.  Pt only needing mod facilitation for transfer back to the wheelchair stand pivot from the bed, with mod facilitation on the right knee for slight weightbearing.  Pt left in room with safety belt in place and call button within reach.  Therapist did not apply chair alarm and nursing was notified of this.    Therapy Documentation Precautions:  Precautions Precautions: Fall Precaution Comments: global aphasia, aspiration risk, dense R hemiplegia Restrictions Weight Bearing Restrictions: No  Pain: Pain Assessment Pain Score: 0-No pain ADL: See FIM for current functional status  Therapy/Group: Individual Therapy  Clorinda Wyble OTR/L 10/28/2014, 4:12 PM

## 2014-10-28 NOTE — Patient Care Conference (Signed)
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 Right hemiparesis 10/21/2014  . 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 Claudette Laws Social Worker Present: Dossie Der, LCSW Nurse Present: Carmie End, RN PT Present: Edman Circle, PT;Rodney Virl Son, PT OT Present: Roney Mans, Heath Lark, OT SLP Present: Jackalyn Lombard, SLP PPS Coordinator present : Tora Duck, RN, CRRN     Current Status/Progress Goal Weekly Team Focus  Medical   39 year old male with hypertension who sustained a large left MCA distribution infarct with severe aphasia, hemiplegia and apraxia  Home discharge with family assist, hypertensive management  Increase ability to follow commands   Bowel/Bladder   Continent of bowel and bladder. Occassional incontinence of bladder. LBM 5/21  Mod assist  Remain continent of bowel and bladder   Swallow/Nutrition/ Hydration   Dys 1, honey thick liquids   min assist   trials of advanced consistencies    ADL's   mod A overall; LB dressing-tot A; max multimodal cues for task initiation; functional transfers - mod A  min A overall; UB dressing-supervision  BADL retraining, RUE NMR, functional transfers, family  education,    Mobility   mod/max A for bed mobility with HOB flat and without rails, mod A stand pivot to the R, max A to the L, max A to +2 for dynamic standing and +2A for gait.  Very limited by aphasia and apraxia during functional mobility.    S to min A (mod A ambulation goals for therapy only at this time)  R NMR, automatic tasks, functional transfers, bed mobility, pt/family education   Communication   max assist for functional communication   min-mod assist   automatic sequences, following directions, answering yes/no questions   Safety/Cognition/ Behavioral Observations  max assist for functional problem solving, safety awareness, initiation and sequencing during basic famliiar tasks   mod assist   sequencing, initiation, safety awareness   Pain   Pain and tingling in RUE and right shoulder,  tylenol q 4hr PRN  <2 on a 0-10 scale  assess pain q 4hr and medicate as needed   Skin   Right groin with with dry gauze dressing and tegaderm  remain free of infection and breakdown while on rehab  assess skin q shift      *See Care Plan and progress notes for long and short-term goals.  Barriers to Discharge: Heavy physical assist    Possible Resolutions to Barriers:  Continue rehabilitation, adequate bracing    Discharge Planning/Teaching Needs:  Home with wife and pt's parents to assist-here daily observing in therapies      Team Discussion:  making dailly progress-goals-supervision/min level of assist. Aphasia-aphaxic.  MBS next week. Dys 1  honey thick liquids. Pushes to affected side-staring to weight bear on affected side. Will need four weeks here of inpt rehab  Revisions to Treatment Plan:  None   Continued Need for Acute Rehabilitation Level of Care: The patient requires daily medical management by a physician with specialized training in physical medicine and rehabilitation for the following conditions: Daily direction of a multidisciplinary physical rehabilitation  program to ensure safe treatment while eliciting the highest outcome that is of practical value to the patient.: Yes Daily medical management of patient stability for increased activity during participation in an intensive rehabilitation regime.: Yes Daily analysis of laboratory values and/or radiology reports with any subsequent need for medication adjustment of medical intervention for : Neurological problems;Other  Mario [name], [name] 10/28/2014, 2:03 PM

## 2014-10-29 ENCOUNTER — Inpatient Hospital Stay (HOSPITAL_COMMUNITY): Payer: BC Managed Care – PPO

## 2014-10-29 ENCOUNTER — Inpatient Hospital Stay (HOSPITAL_COMMUNITY): Payer: BC Managed Care – PPO | Admitting: Occupational Therapy

## 2014-10-29 ENCOUNTER — Inpatient Hospital Stay (HOSPITAL_COMMUNITY): Payer: BC Managed Care – PPO | Admitting: Speech Pathology

## 2014-10-29 DIAGNOSIS — I69390 Apraxia following cerebral infarction: Secondary | ICD-10-CM

## 2014-10-29 DIAGNOSIS — R482 Apraxia: Secondary | ICD-10-CM

## 2014-10-29 MED ORDER — TRAMADOL HCL 50 MG PO TABS
50.0000 mg | ORAL_TABLET | Freq: Four times a day (QID) | ORAL | Status: DC | PRN
  Administered 2014-10-30 – 2014-11-13 (×11): 50 mg via ORAL
  Filled 2014-10-29 (×14): qty 1

## 2014-10-29 MED ORDER — SENNOSIDES-DOCUSATE SODIUM 8.6-50 MG PO TABS
2.0000 | ORAL_TABLET | Freq: Two times a day (BID) | ORAL | Status: DC
  Administered 2014-10-30 – 2014-11-17 (×26): 2 via ORAL
  Filled 2014-10-29 (×33): qty 2

## 2014-10-29 NOTE — Progress Notes (Signed)
Patient refused all medications, vital signs and lab work on this shift.  Patient has not had a BM since 5/21, PRN offered patient refused. Pt. Wife stated, patient was in pain, PRN pain med offered patient refused.

## 2014-10-29 NOTE — Progress Notes (Signed)
Occupational Therapy Session Note  Patient Details  Name: Mellody DrownJerome A Mathieu MRN: [id number] Date of Birth: [birth year]  Today's Date: 10/29/2014 OT Individual Time: [id number] OT Individual Time Calculation (min): 33 min    Skilled Therapeutic Interventions/Progress Updates:    Pt worked on Best boyUE neuromuscular re-education during session.  Pt able to transfer stand pivot to the mat from the wheelchair with mod assist.  He was able to maintain neutral trunk alignment while engaged in moveable chain weightbearing activity using tilted stool.  Trace shoulder flexion/extension noted when stool was placed in the right plane.  Also worked on internal rotation using hand on top of therapy ball.  He was able to demonstrate trace internal rotation as well.  Educated pt's wife on PROM exercises for the right shoulder, elbow, and hand at end of session.  Need to have her return demonstrate next visit.    Therapy Documentation Precautions:  Precautions Precautions: Fall Precaution Comments: global aphasia, aspiration risk, dense R hemiplegia Restrictions Weight Bearing Restrictions: No  Pain: Pain Assessment Pain Assessment: Faces Faces Pain Scale: No hurt ADL: See FIM for current functional status  Therapy/Group: Individual Therapy  Bandy Honaker OTR/L 10/29/2014, 4:11 PM

## 2014-10-29 NOTE — Progress Notes (Signed)
Social Work Patient ID: Mario [name], male   DOB: 1975-08-31, 39 y.o.   MRN: [id number] Met with pt and wife to discuss team conference goals-supervision/min level of assist and target discharge date 6/15.  Both pleased with pt's progress and are  Hopeful he will continue to progress.  Aware of MBS next week.  Wife is here daily and providing support if she is not than pt's Dad is here.  Will continue to work on Discharge plans and support.

## 2014-10-29 NOTE — Progress Notes (Signed)
Occupational Therapy Weekly Progress Note  Patient Details  Name: Mario [name] MRN: [id number] Date of Birth: 1975-12-30  Beginning of progress report period: Oct 22, 2014 End of progress report period: Oct 29, 2014  Today's Date: 10/29/2014 OT Individual Time: 0905-1004 OT Individual Time Calculation (min): 59 min    Patient has met 3 of 4 short term goals.  Mr. Cobbins continues to demonstrate severe expressive and receptive deficits with regard to selfcare function.  Max demonstrational cueing needed to sequence through all bathing tasks including integration of the RUE with max facilitation.  He is able to complete dressing tasks overall with max assist as well for LB and mod assist for UB.  Brunnstrum stage II movement in the right arm with stage I in the hand.  He exhibits a slight 1 finger inferior anterior subluxation in the RUE as well with slight shoulder pain with functional movement.  He continues to demonstrate improvement with functional transfers to and from the toilet as well as the shower.  Currently mod assist for stand pivot transfers but still with decreased activation and sustained activity with knee and hip extension on the left side.  Recommend continued OT to address these current deficits in order to reach supervision to min assist established goals.    Patient continues to demonstrate the following deficits: decreased balance, decreased safety awareness, decreased cognitive processing, decreased RUE and RLE strength and coordination,  and therefore will continue to benefit from skilled OT intervention to enhance overall performance with BADL.  Patient progressing toward long term goals..  Continue plan of care.  OT Short Term Goals Week 2:  OT Short Term Goal 1 (Week 2): Pt will donn pullover shirt with min assist and mod demonstrational cueing for hemi techniques OT Short Term Goal 2 (Week 2): Pt will perform LB dressing with min assist sit to stand following  hemi-techniques OT Short Term Goal 3 (Week 2): Pt will initiate and complete all bathing with no more than mod demonstrational cueing. OT Short Term Goal 4 (Week 2): Pt will utilize hand over hand technique for bathing with min cues in order to increase functional involvement of hemiplegic side.  OT Short Term Goal 5 (Week 2): [name] will return demonstrate safe assist with PROM exercises for the RUE and hand.  Skilled Therapeutic Interventions/Progress Updates:    Bathing and dressing shower level with wife observing session.  He was able to transfer to the EOB with mod assist and then perform stand pivot transfer to the wheelchair with mod assist as well.  Max demonstrational cueing for sequencing through the bathing task with max hand over hand assistance to incorporate the RUE.  He needed mod assist for transfer into and out of the shower but continues to require mod facilitation on the right knee and hip.  Increased hyperextension of the right knee noted during transfer.  Dressing performed at the sink sit to stand with mod assist for donning pullover shirt and max assist to complete LB.  Mr. Marsiglia continues to demonstrate impulsivity and needs hands on cueing with visual demonstration to slow down.  Pt left with his wife at the sink to finish grooming tasks from wheelchair level.   Therapy Documentation Precautions:  Precautions Precautions: Fall Precaution Comments: global aphasia, aspiration risk, dense R hemiplegia Restrictions Weight Bearing Restrictions: No  Pain: Pain Assessment Pain Assessment: 0-10 Pain Score: 2  Faces Pain Scale: Hurts a little bit Pain Type: Acute pain Pain Location: Shoulder Pain Orientation:  Right Pain Descriptors / Indicators: Discomfort Pain Onset: With Activity Pain Intervention(s): Repositioned;Medication (See eMAR) ADL: See FIM for current functional status  Therapy/Group: Individual Therapy  Niranjan Rufener OTR/L 10/29/2014, 12:23 PM

## 2014-10-29 NOTE — Progress Notes (Signed)
39 year old right handed male with history of hypertension maintained on hydrochlorothiazide. 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 He [facility] with acute onset of right sided weakness, headache and inability to speak. Cranial CT scan negative. MRI of the head showed moderate volume of patchy acute infarct left MCA territory. Most confluent involvement at the corona radiata and lentiform nuclei. No mass effect. Carotid Dopplers with no ICA stenosis. 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 Collateral supply of left MCA branch vessels decreased in number and caliber in the area of acute infarct. Carotid Dopplers with ejection fraction of 65% without emboli Subjective/Complaints: Wife is with him today. Remains severely aphasic. Minimal movement right upper extremity noted  Does activate right lower extremity with weightbearing for PT  Review of Systems - unable to obtain secondary to severe aphasia  Objective: Vital Signs: Blood pressure 116/80, pulse 68, temperature 98 F (36.7 C), temperature source Oral, resp. rate 18, height 6' (1.829 m), weight 119 kg (262 lb 5.6 oz), SpO2 97 %. No results found. No results found for this or any previous visit (from the past 72 hour(s)).   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 no acute  distress   Assessment/Plan: 1. 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 Physiatrist is providing close team supervision and 24 hour management of active medical problems listed below. Physiatrist and rehab team continue to assess barriers to discharge/monitor patient progress toward functional and medical goals.  FIM: FIM - Bathing Bathing Steps Patient Completed: Chest, Left Arm, Abdomen, Front perineal area, Right upper leg, Left upper leg Bathing: 3: Mod-Patient completes 5-7 [id number] 10 parts or 50-74%  FIM - Upper Body Dressing/Undressing Upper body dressing/undressing steps patient completed: Thread/unthread left sleeve of pullover shirt/dress, Put head through opening of pull over shirt/dress Upper body dressing/undressing: 3: Mod-Patient completed 50-74% of tasks FIM - Lower Body Dressing/Undressing Lower body dressing/undressing steps patient completed: Thread/unthread left pants leg, Don/Doff left shoe, Fasten/unfasten left shoe Lower body dressing/undressing: 2: Max-Patient completed 25-49% of tasks  FIM - Toileting Toileting steps completed by patient: Adjust clothing prior to toileting Toileting Assistive Devices: Grab bar or rail for support Toileting: 2: Max-Patient completed 1 of 3 steps  FIM - Diplomatic Services operational officerToilet Transfers Toilet Transfers Assistive Devices: Grab bars Toilet Transfers: 4-To toilet/BSC: Min A (steadying Pt. > 75%), 4-From toilet/BSC: Min A (steadying Pt. > 75%)  FIM - Bed/Chair Transfer Bed/Chair Transfer Assistive Devices: Arm rests, Bed rails Bed/Chair Transfer: 4: Sit > Supine: Min A (steadying pt. > 75%/lift 1 leg), 3: Bed > Chair or W/C: Mod A (lift or lower assist), 3: Chair or W/C > Bed: Mod A (lift or lower assist)  FIM - Locomotion: Wheelchair Distance: 80 Locomotion: Wheelchair: 4: Travels 150  ft or  more: maneuvers on rugs and over door sillls with minimal assistance (Pt.>75%) FIM - Locomotion: Ambulation Locomotion: Ambulation Assistive Devices: Other (comment) (three muskateer style) Ambulation/Gait Assistance: 1: +2 Total assist Locomotion: Ambulation: 0: Activity did not occur  Comprehension Comprehension Mode: Auditory Comprehension: 3-Understands basic 50 - 74% of the time/requires cueing 25 - 50%  of the time  Expression Expression Mode: Nonverbal Expression: 1-Expresses basis less than 25% of the time/requires cueing greater than 75% of the time.  Social Interaction Social Interaction: 3-Interacts appropriately 50 - 74% of the time - May be physically or verbally inappropriate.  Problem Solving Problem Solving: 2-Solves basic 25 - 49% of the time - needs direction more than half the time to initiate, plan or complete simple activities  Memory Memory: 2-Recognizes or recalls 25 - 49% of the time/requires cueing 51 - 75% of the time  Medical Problem List and Plan: 1. Functional deficits secondary to left MCA infarct,aphasia and apraxia as well 2. DVT Prophylaxis/Anticoagulation: Subcutaneous Lovenox. Monitor platelet counts and any signs of bleeding 3. Pain Management: Tylenol as needed 4. Dysphagia. Dysphagia 1 honey liquids. Monitor hydration, encourage po 5. Neuropsych: This patient is capable of making decisions on his own behalf. 6. Skin/Wound Care: Routine skin checks 7. Fluids/Electrolytes/Nutrition: Routine eye and nose with follow-up chemistries 8. Hypertension. No current antihypertensive medication. Patient on hydrochlorothiazide 12.5 mg daily prior to admission. Monitor with increased mobility  LOS (Days) 8 A FACE TO FACE EVALUATION WAS PERFORMED  KIRSTEINS,ANDREW E 10/29/2014, 4:18 PM

## 2014-10-29 NOTE — Progress Notes (Signed)
Physical Therapy Session Note  Patient Details  Name: Mario [name] MRN: [id number] Date of Birth: April 03, 1976  Today's Date: 10/29/2014 PT Individual Time: 1400-1500 PT Individual Time Calculation (min): 60 min   Short Term Goals: Week 1:  PT Short Term Goal 1 (Week 1): Pt will perform bed mobility with HOB flat and without rails at min A level with 50% cues to attend to RUE/LE.  PT Short Term Goal 1 - Progress (Week 1): Progressing toward goal PT Short Term Goal 2 (Week 1): Pt will perform functional transfers R and L at mod A level with 50% cues to attend to RUE/LE PT Short Term Goal 2 - Progress (Week 1): Progressing toward goal PT Short Term Goal 3 (Week 1): Pt will perform dynamic standing activity x 3 mins at mod A level and single UE support PT Short Term Goal 3 - Progress (Week 1): Progressing toward goal PT Short Term Goal 4 (Week 1): Pt will self propel w/c x 100' using L hemi technique at min A level with 50% cues to attend to the R.   PT Short Term Goal 4 - Progress (Week 1): Met PT Short Term Goal 5 (Week 1): Pt will ambulate x 25' w/ LRAD and max A with +2A for chair follow PT Short Term Goal 5 - Progress (Week 1): Met  Skilled Therapeutic Interventions/Progress Updates:   Session focused on functional transfers (to R and L stand pivot technique) with facilitation for weightshift during transfer and overall mod A, w/c propulsion to address functional mobility and attention with S to min A due to decreased attention to R to avoid obstacles, neuro re-ed for dynamic standing balance and postural control activity with dual task to address cognition and aphasia to reach for items (with directions to match colors and then decide between colors of beanbags and placecards on floor) bias to L and anterior progressing to all directions with overall min A for sit to stands and balance, Nustep for neuro re-ed to BLE and BUE (used R hand splint for support) for reciprocal movement pattern  training on level 5 x 5 min with facilitation at R knee to promote correct alignment (tendency to abduct), and gait training with total A +2 with therapist providing manual facilitation and cues for weightshifting and physical A to advance RLE with approximation at R knee for extension during stance (x 10' and then x 45'). Pt demonstrating improved functional transfers and balance today. Pt and wife pleased with progress with gait during today's session.   Therapy Documentation Precautions:  Precautions Precautions: Fall Precaution Comments: global aphasia, aspiration risk, dense R hemiplegia Restrictions Weight Bearing Restrictions: No   Pain: Denies pain.  See FIM for current functional status  Therapy/Group: Individual Therapy  Canary Brim Ivory Broad, PT, DPT  10/29/2014, 3:45 PM

## 2014-10-29 NOTE — Progress Notes (Signed)
Speech Language Pathology Weekly Progress and Session Note  Patient Details  Name: Mario [name] MRN: [id number] Date of Birth: 1976/01/25  Beginning of progress report period: Oct 22, 2014 End of progress report period: Oct 29, 2014  Today's Date: 10/29/2014 SLP Individual Time: 1300-1400 SLP Individual Time Calculation (min): 60 min  Short Term Goals: Week 1: SLP Short Term Goal 1 (Week 1): Pt will answer basic, immediate, biographical and/or environmental yes/no questions with max assist multimodal cuing.   SLP Short Term Goal 1 - Progress (Week 1): Met SLP Short Term Goal 2 (Week 1): Pt will follow 1-step directions in the context of a basic, familiar task with mod-max assist multimodal cuing.  SLP Short Term Goal 2 - Progress (Week 1): Met SLP Short Term Goal 3 (Week 1): Pt will initiate vocalizations to produce vowels and/or CV syllables with max assist multimodal cuing.  SLP Short Term Goal 3 - Progress (Week 1): Met SLP Short Term Goal 4 (Week 1): Pt will return demonstration of the call bell with max assist multimodal cuing.  SLP Short Term Goal 4 - Progress (Week 1): Met SLP Short Term Goal 5 (Week 1): Pt will consume his currently presecribed diet with min assist verbal cues for use of rate and portion control, liquid wash, lingual sweep, and manual assist to monitor and correct right sided buccal residue.  SLP Short Term Goal 5 - Progress (Week 1): Met    New Short Term Goals: Week 2: SLP Short Term Goal 1 (Week 2): Pt will answer basic, immediate, biographical and/or environmental yes/no questions with max assist gestural cuing.   SLP Short Term Goal 2 (Week 2): Pt will follow 1-step directions in the context of a basic, familiar task with mod assist verbal cuing.  SLP Short Term Goal 3 (Week 2): Pt will initiate vocalizations to produce vowels and/or CV syllables with max assist visual and verbal cuing.  SLP Short Term Goal 4 (Week 2): Pt will return demonstration of the  call bell with mod assist verbal cuing.  SLP Short Term Goal 5 (Week 2): Pt will consume his currently presecribed diet with supervision cues for use of rate and portion control, liquid wash, lingual sweep, and manual assist to monitor and correct right sided buccal residue.   Weekly Progress Updates:  Pt made functional gains this reporting period and has met 6 out of 6 short term goals.  Pt remains nonverbal and requires max assist for functional communication via multimodal means; however, he has demonstrated improvements in initiation of brief periods of sustained phonation with consonant and vowel production.  He also has demonstrated improved accuracy in answering basic, immediate yes/no questions.  Pt remains impulsive with poor safety awareness, apraxia, and impulsivity.  Pt's diet has been upgraded to dys 2 textures with continued honey thick liquids and full supervision for use of swallowing precautions due to risk of silent aspiration per most recent MBS.  Pt has demonstrated pre-functional gains that indicate potential readiness for repeat objective swallow study including improved management of secretions.  Pt would continue to benefit from skilled ST while inpatient in order to maximize functional independence and reduce burden of care prior to discharge.  Continue to anticipate that pt will need 24/7 supervision, assistance for medication and financial management, and ST follow up at discharge.  Pt and family education is ongoing.     Intensity: Minumum of 1-2 x/day, 30 to 90 minutes Frequency: 3 to 5 out of 7 days Duration/Length of  Stay: 21-28 days  Treatment/Interventions: Cognitive remediation/compensation;Cueing hierarchy;Patient/family education;Speech/Language facilitation;Internal/external aids;Environmental controls;Functional tasks;Dysphagia/aspiration precaution training;Multimodal communication approach   Daily Session  Skilled Therapeutic Interventions: Pt was seen for  skilled ST targeting goals for communication and dysphagia.  Upon arrival, pt was seated upright in bed, awake, alert, and agreeable to participate in [place].  Pt's wife was present and bedside.  SLP completed skilled observations during presentations of dys 2 textures and honey thick liquids.  Pt utilized rate and portion control with min assist verbal cues.  No significant buccal residue was left in the oral cavity post swallow with the abovementioned skilled ST interventions and pt exhibited no overt s/s of aspiration with solids or liquid consistencies.  Pt's wife educated regarding rationale behind currently recommended swallowing precautions and she was able to return demonstration of cuing techniques to maximize pt's independence for use of compensatory swallowing strategies.  Pt's wife is now signed off to supervise pt during meals.  Pt also approximated vowel and consonant shapes (u, m, l) with max assist demonstration cues.  Pt was able to briefly sustain phonation to produce /m/ with vocal quality remaining hoarse and breathy but with increased intensity today in comparison to previous therapy sessions.  Pt was left seated upright in bed, handed off to PT, wife present at bedside.  Goals updated on this date to reflect current progress and plan of care.        FIM:  Comprehension Comprehension Mode: Auditory Comprehension: 3-Understands basic 50 - 74% of the time/requires cueing 25 - 50%  of the time Expression Expression Mode: Nonverbal Expression: 1-Expresses basis less than 25% of the time/requires cueing greater than 75% of the time. Social Interaction Social Interaction: 3-Interacts appropriately 50 - 74% of the time - May be physically or verbally inappropriate. Problem Solving Problem Solving: 2-Solves basic 25 - 49% of the time - needs direction more than half the time to initiate, plan or complete simple activities Memory Memory: 2-Recognizes or recalls 25 - 49% of the time/requires  cueing 51 - 75% of the time FIM - Eating Eating Activity: 5: Needs verbal cues/supervision;4: Helper checks for pocketed food General    Pain Pain Assessment Pain Assessment: Faces Faces Pain Scale: No hurt  Therapy/Group: Individual Therapy  Page, Selinda Orion 10/29/2014, 3:44 PM

## 2014-10-30 ENCOUNTER — Inpatient Hospital Stay (HOSPITAL_COMMUNITY): Payer: BC Managed Care – PPO | Admitting: Physical Therapy

## 2014-10-30 ENCOUNTER — Inpatient Hospital Stay (HOSPITAL_COMMUNITY): Payer: BC Managed Care – PPO | Admitting: Speech Pathology

## 2014-10-30 ENCOUNTER — Inpatient Hospital Stay (HOSPITAL_COMMUNITY): Payer: BC Managed Care – PPO | Admitting: Occupational Therapy

## 2014-10-30 MED ORDER — ENOXAPARIN SODIUM 40 MG/0.4ML ~~LOC~~ SOLN
40.0000 mg | SUBCUTANEOUS | Status: DC
  Administered 2014-10-31 – 2014-11-18 (×16): 40 mg via SUBCUTANEOUS
  Filled 2014-10-30 (×21): qty 0.4

## 2014-10-30 NOTE — Progress Notes (Addendum)
Physical Therapy Session Note  Patient Details  Name: Mario [name] MRN: [id number] Date of Birth: Oct 27, 1975  Today's Date: 10/30/2014 PT Concurrent treatment 90 minutes [phone]([id number])    Short Term Goals: Week 2:  PT Short Term Goal 1 (Week 2): Pt will perform bed mobility with HOB flat and without rails at min A level with 50% cues to attend to RUE/LE.  PT Short Term Goal 2 (Week 2): Pt will perform functional transfers R and L at mod A level with 50% cues to attend to RUE/LE PT Short Term Goal 3 (Week 2): Pt will perform dynamic standing activity x 3 mins at mod A level and single UE support PT Short Term Goal 4 (Week 2): Pt will ambulate x 25' w/ LRAD and mod A with +2A for chair follow for safety.      Therapy Documentation Precautions:  Precautions Precautions: Fall Precaution Comments: global aphasia, aspiration risk, dense R hemiplegia Restrictions Weight Bearing Restrictions: No    Transfers: Sit to and from stand transfer with min assist; verbal cues even weight distribution and controlled descent.   Pre-gait activities: Patient performed rapid stepping for approximately 90 seconds with RLE min assist in order to increase weight bearing of LLE for strengthening and initiation of protective stepping strategies.  Approximation applied at left knee to prevent buckling.   Patient performed step ups on a 6 inch step with RLE 20x mod assist with left hemiwalker and right blue rocker in order to increase weight bearing of LLE and strengthening and endurance.  Approximation applied at left knee to prevent buckling.   Ambulation: Patient ambulated 50 feet and 55 feet with left hemi walker right blue rocker brace and mod assist. Patient ambulated 80 feet with left wide based quad cane mod assist with right blue rocker brace donned.  Patient ambulated with a varied step to and step through gait pattern. Patient required max assist for upright posture, positioning in midline,  advancement, placement and stabilization of right lower extremity. 2 assist for wheelchair follow.   Stair negotiation: Patient negotiated 8steps with left handrail and max assist right blue rocker donned. Patient required max assist for upright posture, positioning in midline, advancement, placement and stabilization of right lower extremity. Patient performed stairs with a step to pattern. Patient educated on proper sequence and technique and was able to return demonstration with minimal verbal cues.   Patient onto mat with max assist and descended from tall knee position with mod assist. Patient performed tall kneeling on mat for approximately 5 minutes with emphasis even weight distribution, upright posture, and positioning in midline.   Sit to and from stand transfer block practice mod assist 10x. Focus on anterior weight shift, upright posture and even weight distribution.    Patient tolerated treatment well. Vitals monitored and remained stable throughout session responding appropriately to activity. Patient was without pain during session. Patient tolerated session with rest breaks throughout. Patient returned to room at end of session with Wheelchair seat belt engaged. Call bell within reach and patient educated not to be up without assistance.  Wife present for observation and with patient in room at end of session.    See FIM for current functional status  Therapy/Group: Concurrent  Merri RayWISHART,Long Brimage J 10/30/2014, 3:28 PM

## 2014-10-30 NOTE — Progress Notes (Signed)
Occupational Therapy Session Note  Patient Details  Name: Mario [name] MRN: [id number] Date of Birth: Jul 24, 1975  Today's Date: 10/30/2014 OT Individual Time: 0800-0900 OT Individual Time Calculation (min): 60 min    Short Term Goals: Week 2:  OT Short Term Goal 1 (Week 2): Pt will donn pullover shirt with min assist and mod demonstrational cueing for hemi techniques OT Short Term Goal 2 (Week 2): Pt will perform LB dressing with min assist sit to stand following hemi-techniques OT Short Term Goal 3 (Week 2): Pt will initiate and complete all bathing with no more than mod demonstrational cueing. OT Short Term Goal 4 (Week 2): Pt will utilize hand over hand technique for bathing with min cues in order to increase functional involvement of hemiplegic side.  OT Short Term Goal 5 (Week 2): [name] will return demonstrate safe assist with PROM exercises for the RUE and hand.  Skilled Therapeutic Interventions/Progress Updates:    Shower and dressing during session.  Mod assist for transfer to EOB and to wheelchair stand pivot method.  He was able to also transfer to and from the shower with mod assist as well.  Max demonstrational cueing to sequence through all bathing tasks.  Pt with significant motor planning and receptive difficulties to needs hands on cueing to integrate the RUE for simple tasks such as holding the washcloth to apply soap.  Min assist for sit to stand during LB bathing and for LB dressing tasks, when pulling pants over hips.  Max demonstrational cueing for threading LB clothing over the RLE for following hemi techniques.  [name] present for session as well and able to observe level of cueing pt requires.    Therapy Documentation Precautions:  Precautions Precautions: Fall Precaution Comments: global aphasia, aspiration risk, dense R hemiplegia Restrictions Weight Bearing Restrictions: No  Pain: Pain Assessment Pain Assessment: No/denies pain ADL: See FIM for  current functional status  Therapy/Group: Individual Therapy  Mario [name] OTR/L 10/30/2014, 10:54 AM

## 2014-10-30 NOTE — Progress Notes (Signed)
Speech Language Pathology Daily Session Note  Patient Details  Name: Mario [name] MRN: [id number] Date of Birth: 19-Nov-1975  Today's Date: 10/30/2014 SLP Individual Time: 1300-1400 SLP Individual Time Calculation (min): 60 min  Short Term Goals: Week 2: SLP Short Term Goal 1 (Week 2): Pt will answer basic, immediate, biographical and/or environmental yes/no questions with max assist gestural cuing.   SLP Short Term Goal 2 (Week 2): Pt will follow 1-step directions in the context of a basic, familiar task with mod assist verbal cuing.  SLP Short Term Goal 3 (Week 2): Pt will initiate vocalizations to produce vowels and/or CV syllables with max assist visual and verbal cuing.  SLP Short Term Goal 4 (Week 2): Pt will return demonstration of the call bell with mod assist verbal cuing.  SLP Short Term Goal 5 (Week 2): Pt will consume his currently presecribed diet with supervision cues for use of rate and portion control, liquid wash, lingual sweep, and manual assist to monitor and correct right sided buccal residue.   Skilled Therapeutic Interventions:  Pt was seen for skilled ST targeting cognitive-linguistic goals. Upon arrival, pt was seated upright in bed with wife present at bedside.  Pt was transferred to wheelchair with assistance from nurse tech to maximize attention and alertness during structured tasks.  SLP facilitated the session with a visuospatial/constructional task targeting functional problem solving and comprehension for following directions.  Pt reproduced figures from pictures with initial set up and max assist verbal cues, which SLP was able to fade to mod assist verbal and visual cues only for sequencing and organization.  Pt also benefited from min verbal cues to initiate and complete task in a timely manner due to perseveration of errors.  Pt identified object when named from a field of three with max assist verbal and visual cues for ~75% accuracy.  Pt was noted with increased  difficulty achieving voicing today, despite trials with phonemes with which he had been successful yesterday (/m/ and /u/).  Pt remains stimulable for placement of articulators for the abovementioned consonant and vowel with mod assist demonstration cues.  Continue per current plan of care.    FIM:  Comprehension Comprehension Mode: Auditory Comprehension: 3-Understands basic 50 - 74% of the time/requires cueing 25 - 50%  of the time Expression Expression Mode: Nonverbal Expression: 1-Expresses basis less than 25% of the time/requires cueing greater than 75% of the time. Social Interaction Social Interaction: 1-Interacts appropriately less than 25% of the time. May be withdrawn or combative. Problem Solving Problem Solving: 3-Solves basic 50 - 74% of the time/requires cueing 25 - 49% of the time Memory Memory: 2-Recognizes or recalls 25 - 49% of the time/requires cueing 51 - 75% of the time  Pain Pain Assessment Pain Assessment: Faces Faces Pain Scale: No hurt  Therapy/Group: Individual Therapy  Crucita Lacorte, Melanee SpryNicole L 10/30/2014, 3:46 PM

## 2014-10-31 ENCOUNTER — Inpatient Hospital Stay (HOSPITAL_COMMUNITY): Payer: BC Managed Care – PPO | Admitting: Physical Therapy

## 2014-10-31 NOTE — Progress Notes (Signed)
Patient ID: Mario [name], male   DOB: 1976-03-15, 39 y.o.   MRN: [id number]   10/31/14.  Patient ID: Mario [name], male   DOB: 1976-03-15, 39 y.o.   MRN: [id number]   39 year old right handed male with history of hypertension maintained on hydrochlorothiazide.  He presented to [facility] 10/19/2014 with acute onset of right sided weakness, headache and inability to speak. Cranial CT scan negative. MRI of the head showed moderate volume of patchy acute infarct left MCA territory. 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 Collateral supply of left MCA branch vessels decreased in number and caliber in the area of acute infarct. 2 D Echo with ejection fraction of 65% without emboli  Subjective/Complaints:  Alert, comfortable, in no distress.  Unable to follow simple commands due to severe receptive aphasia. Patient is eating well.    Review of Systems - unable to obtain secondary to severe aphasia   Past Medical History  Diagnosis Date  . Hypertension      Objective: Vital Signs: Blood pressure 104/56, pulse 72, temperature 98.8 F (37.1 C), temperature source Oral, resp. rate 18, height 6' (1.829 m), weight 262 lb 5.6 oz (119 kg), SpO2 95 %. No results found. No results found for this or any previous visit (from the past 72 hour(s)).  Patient Vitals for the past 24 hrs:  BP Temp Temp src Pulse Resp SpO2  10/31/14 0622 (!) 104/56 mmHg 98.8 F (37.1 C) Oral 72 18 95 %  10/30/14 1608 116/79 mmHg 98 F (36.7 C) Oral 64 18 99 %     Intake/Output Summary (Last 24 hours) at 10/31/14 [id number] Last data filed at 10/30/14 1230  Gross per 24 hour  Intake    360 ml  Output      0 ml  Net    360 ml    HEENT:  negative Cardio: RRR and no murmur Resp: CTA B/L and unlabored GI: BS positive and NT.ND Extremity:  Edema Mild Right Hand Skin:   Intact Neuro: Alert/Oriented, severe aphasia with dense right hemiparesis Musc/Skel:  No edema  Gen. no acute  distress   Assessment/Plan: 1. 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 DVT Prophylaxis/Anticoagulation: Subcutaneous Lovenox. Monitor platelet counts and any signs of bleeding  3. Dysphagia. Dysphagia 1 honey liquids. Monitor hydration, encourage po  4. Hypertension. No current antihypertensive medication. Patient on hydrochlorothiazide 12.5 mg daily prior to admission. Monitor with increased mobility     LOS (Days) 10 A FACE TO FACE EVALUATION WAS PERFORMED  Rogelia BogaKWIATKOWSKI,PETER FRANK 10/31/2014, 9:25 AM

## 2014-10-31 NOTE — Progress Notes (Signed)
Physical Therapy Session Note  Patient Details  Name: Mario [name] MRN: [id number] Date of Birth: 10/25/1975  Today's Date: 10/31/2014 PT Concurrent Time:  -     Short Term Goals: Week 2:  PT Short Term Goal 1 (Week 2): Pt will perform bed mobility with HOB flat and without rails at min A level with 50% cues to attend to RUE/LE.  PT Short Term Goal 2 (Week 2): Pt will perform functional transfers R and L at mod A level with 50% cues to attend to RUE/LE PT Short Term Goal 3 (Week 2): Pt will perform dynamic standing activity x 3 mins at mod A level and single UE support PT Short Term Goal 4 (Week 2): Pt will ambulate x 25' w/ LRAD and mod A with +2A for chair follow for safety.      Therapy Documentation Precautions:  Precautions Precautions: Fall Precaution Comments: global aphasia, aspiration risk, dense R hemiplegia Restrictions Weight Bearing Restrictions: No  Supine to short sit in bed min assist   Transfers: Sit to and from stand transfer with min assist; verbal cues even weight distribution and controlled descent.  Squat pivot transfer from bed to wheelchair mod assist Squat pivot transfer to and from wheelchair and mat min assist.    Ambulation: Patient ambulated 75 feet x2  with left wide based quad cane mod assist with right blue rocker brace donned.Patient ambulated with a varied step to and step through gait pattern. Patient required max assist for upright posture, positioning in midline, advancement, placement and stabilization of right lower extremity. 2 assist for wheelchair follow.   Stair negotiation: Patient negotiated 12 steps with left handrail and mod assist right blue rocker donned. Patient required max assist for upright posture, positioning in midline, advancement, placement and stabilization of right lower extremity. Patient performed stairs with a step to pattern. Patient educated on proper sequence and technique and was able to return demonstration  with minimal verbal cues.   PNF D1 Flexion right lower extremity patient doesn't appear to have any active motor control. Bridging 30x with approximation applied to right lower extremity.  Patient transition from supine on mat to prone with min assist for right upper extremity management. Prone to quadruped for two trials total assistx2. Total assist for right upper extremity management and stabilization as well for pelvis and hip stabilization. Patient able to tolerate position for 2 and 3 minutes each attempt.  Patient propelled wheelchair to and from room and gym supervision 150 feetx2.  Patient tolerated treatment well. Vitals monitored and remained stable throughout session responding appropriately to activity. Patient was without pain during session. Patient tolerated session with rest breaks throughout. Patient returned to room at end of session with Wheelchair seat belt engaged. Call bell within reach and patient educated not to be up without assistance. Wife present for observation and with patient in room at end of session.   Therapy/Group: concurrent   Mario [name],[name] 10/31/2014, 12:28 PM

## 2014-11-01 ENCOUNTER — Inpatient Hospital Stay (HOSPITAL_COMMUNITY): Payer: BC Managed Care – PPO

## 2014-11-01 NOTE — Progress Notes (Signed)
Occupational Therapy Note  Patient Details  Name: Mario [name] MRN: [id number] Date of Birth: Oct 17, 1975  Today's Date: 11/01/2014 OT Individual Time: 1445-1530 OT Individual Time Calculation (min): 45 min   Pt denied pain Individual Therapy  Pt resting in recliner with wife present upon arrival.  Pt performed stand pivot transfer recliner->w/c with max A and max multimodal cues for task initiation, sequencing, and safety awareness.  Pt propelled to therapy gym and transferred to mat and engaged in RUE NMR including weight bearing through RUE and facilitated shoulder flexion/extension with gravity eliminated.  Trace shoulder flexion noted.  Pt also engaged in sit<>stand and squats from elevated mat.  Pt requires max multimodal cues to initiate tasks and perseverates on task once task learned.  Pt performed squat pivot transfer back to w/c and w/c->recliner with mod A and max multimodal cues for safety.   [name], [name] [name] 11/01/2014, 3:56 PM

## 2014-11-01 NOTE — Progress Notes (Signed)
Patient ID: Mario [name], male   DOB: [birth year], 39 y.o.   MRN: [id number]  Patient ID: Mario [name], male   DOB: [birth year], 39 y.o.   MRN: [id number]   10/31/14.  Patient ID: Mario [name], male   DOB: [birth year], 39 y.o.   MRN: [id number]   39 year old right handed male with history of hypertension maintained on hydrochlorothiazide.  He was admitted for CIR after L MCA stroke with global aphasia and dense R HP.  Subjective/Complaints:  Alert, comfortable, in no distress.  Smiling-seems to be in good spirits; wife at bedside Patient is eating well.    Review of Systems - unable to obtain secondary to severe aphasia   Past Medical History  Diagnosis Date  . Hypertension      Objective: Vital Signs: Blood pressure 124/66, pulse 73, temperature 98.1 F (36.7 C), temperature source Oral, resp. rate 18, height 6' (1.829 m), weight 262 lb 5.6 oz (119 kg), SpO2 97 %. No results found. No results found for this or any previous visit (from the past 72 hour(s)).  Patient Vitals for the past 24 hrs:  BP Temp Temp src Pulse Resp SpO2  11/01/14 0536 124/66 mmHg 98.1 F (36.7 C) Oral 73 18 97 %  10/31/14 1500 112/63 mmHg 98 F (36.7 C) Oral 70 18 98 %     Intake/Output Summary (Last 24 hours) at 11/01/14 0810 Last data filed at 10/31/14 1700  Gross per 24 hour  Intake    240 ml  Output      0 ml  Net    240 ml    HEENT:  negative Cardio: RRR and no murmur Resp: CTA B/L and unlabored GI: BS positive and NT.ND Extremity:  Edema Mild Right Hand Skin:   Intact Neuro: Alert/Oriented, severe aphasia with dense right hemiparesis Musc/Skel:  No edema  Gen. no acute distress   Assessment/Plan: 1. 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 DVT Prophylaxis/Anticoagulation: Subcutaneous Lovenox. Monitor platelet counts and any signs of bleeding  3. Dysphagia. Dysphagia 1 honey liquids. Monitor hydration, encourage po  4.  Hypertension. No current antihypertensive medication. Patient on hydrochlorothiazide 12.5 mg daily prior to admission. Monitor with increased mobility  Remains well controlled     LOS (Days) 11 A FACE TO FACE EVALUATION WAS PERFORMED  Rogelia BogaKWIATKOWSKI,PETER FRANK 11/01/2014, 8:10 AM

## 2014-11-02 ENCOUNTER — Inpatient Hospital Stay (HOSPITAL_COMMUNITY): Payer: BC Managed Care – PPO | Admitting: Occupational Therapy

## 2014-11-02 ENCOUNTER — Inpatient Hospital Stay (HOSPITAL_COMMUNITY): Payer: BC Managed Care – PPO | Admitting: Speech Pathology

## 2014-11-02 ENCOUNTER — Inpatient Hospital Stay (HOSPITAL_COMMUNITY): Payer: BC Managed Care – PPO

## 2014-11-02 ENCOUNTER — Inpatient Hospital Stay (HOSPITAL_COMMUNITY): Payer: BC Managed Care – PPO | Admitting: Rehabilitation

## 2014-11-02 LAB — BASIC METABOLIC PANEL
ANION GAP: 13 (ref 5–15)
BUN: 18 mg/dL (ref 6–20)
CALCIUM: 9.7 mg/dL (ref 8.9–10.3)
CO2: 26 mmol/L (ref 22–32)
Chloride: 101 mmol/L (ref 101–111)
Creatinine, Ser: 1.24 mg/dL (ref 0.61–1.24)
GFR calc Af Amer: >60 mL/min (ref >60)
GFR calc non Af Amer: >60 mL/min (ref >60)
Glucose, Bld: 100 mg/dL — ABNORMAL HIGH (ref 65–99)
Potassium: 3.9 mmol/L (ref 3.5–5.1)
SODIUM: 140 mmol/L (ref 135–145)

## 2014-11-02 NOTE — Progress Notes (Signed)
Occupational Therapy Session Note  Patient Details  Name: Mario [name] MRN: [id number] Date of Birth: July 04, 1975  Today's Date: 11/02/2014 OT Individual Time: 0930-1030 OT Individual Time Calculation (min): 60 min    Short Term Goals: Week 2:  OT Short Term Goal 1 (Week 2): Pt will donn pullover shirt with min assist and mod demonstrational cueing for hemi techniques OT Short Term Goal 2 (Week 2): Pt will perform LB dressing with min assist sit to stand following hemi-techniques OT Short Term Goal 3 (Week 2): Pt will initiate and complete all bathing with no more than mod demonstrational cueing. OT Short Term Goal 4 (Week 2): Pt will utilize hand over hand technique for bathing with min cues in order to increase functional involvement of hemiplegic side.  OT Short Term Goal 5 (Week 2): [name] will return demonstrate safe assist with PROM exercises for the RUE and hand.  Skilled Therapeutic Interventions/Progress Updates:    Pt engaged in BADL retraining including bathing at shower level and dressing with sit<>stand from w/c.  Pt performed stand pivot transfers with min A and mod multimodal cues for safe RLE positioning prior to transfer.  Pt required HHA to use RUE to assist with bathing tasks.  Pt required max multimodal cues for threading RUE and RLE into garments first.  Pt required min A for sit<>stand and steady A while standing to bathe buttocks and pull up pants.  Pt required min multimodal cues for sequencing during bathing tasks.  Pt nonverbal throughout session.  Focus on activity tolerance, sit<>stand, functional transfers, standing balance, task initiation, sequencing, attention to right, and safety awareness.  Therapy Documentation Precautions:  Precautions Precautions: Fall Precaution Comments: global aphasia, aspiration risk, dense R hemiplegia Restrictions Weight Bearing Restrictions: No   Pain: Pain Assessment Pain Assessment: Faces Faces Pain Scale: No  hurt  See FIM for current functional status  Therapy/Group: Individual Therapy  Rich BraveLanier, Mollye Guinta Chappell 11/02/2014, 10:48 AM

## 2014-11-02 NOTE — Progress Notes (Signed)
Speech Language Pathology Daily Session Note  Patient Details  Name: Mario [name] MRN: [id number] Date of Birth: [birth year]  Today's Date: 11/02/2014 SLP Individual Time: 1101-1203 SLP Individual Time Calculation (min): 62 min  Short Term Goals: Week 2: SLP Short Term Goal 1 (Week 2): Pt will answer basic, immediate, biographical and/or environmental yes/no questions with max assist gestural cuing.   SLP Short Term Goal 2 (Week 2): Pt will follow 1-step directions in the context of a basic, familiar task with mod assist verbal cuing.  SLP Short Term Goal 3 (Week 2): Pt will initiate vocalizations to produce vowels and/or CV syllables with max assist visual and verbal cuing.  SLP Short Term Goal 4 (Week 2): Pt will return demonstration of the call bell with mod assist verbal cuing.  SLP Short Term Goal 5 (Week 2): Pt will consume his currently presecribed diet with supervision cues for use of rate and portion control, liquid wash, lingual sweep, and manual assist to monitor and correct right sided buccal residue.   Skilled Therapeutic Interventions:  Pt was seen for skilled ST targeting cognitive goals.  Upon arrival, pt was seated upright in recliner with wife and mother present at bedside.  Pt's mother was present for the majority of today's therapy session and remained appropriately engaged throughout all structured therapeutic activities. SLP facilitated the session with trials of nectar thick liquids to continue working towards diet advancement.  Pt was noted with multiple swallows on ~25% of trials with no changes in respirations or overt s/s of aspiration such as coughing or throat clearing.  Pt was noted with what appeared to be timely and swift swallow initiation.  Given improvements noted at bedside, recommend a repeat MBS tomorrow or at next available appointment to determine readiness for liquids progression.   SLP also facilitated the session with skilled drill practice targeting  volitional phonation at the single phoneme level.  Pt was able to accurately produce repetitions of /m,u,a, and e/ with mod-max assist phonemic placement cues.  During the abovementioned activity, pt also benefited from mod-max assist verbal and visual cues to facilitate improved awareness of perseverative verbal errors as well as to facilitate coordination of respiration with onset of phonation.  SLP provided skilled education with pt's mother regarding pt's current goals and progress in therapy and rationale behind current interventions.  Pt's mother verbalized understanding; however, SLP will continue to reinforce recommendations as appropriate.  Pt left upright in recliner at the end of today's session with wife and mother present.  All needs left within reach.  Continue per current plan of care.    FIM:  Comprehension Comprehension Mode: Auditory Comprehension: 3-Understands basic 50 - 74% of the time/requires cueing 25 - 50%  of the time Expression Expression Mode: Nonverbal Expression: 2-Expresses basic 25 - 49% of the time/requires cueing 50 - 75% of the time. Uses single words/gestures. Social Interaction Social Interaction: 4-Interacts appropriately 75 - 89% of the time - Needs redirection for appropriate language or to initiate interaction. Problem Solving Problem Solving: 3-Solves basic 50 - 74% of the time/requires cueing 25 - 49% of the time Memory Memory: 2-Recognizes or recalls 25 - 49% of the time/requires cueing 51 - 75% of the time FIM - Eating Eating Activity: 5: Needs verbal cues/supervision  Pain Pain Assessment Pain Assessment: Faces Faces Pain Scale: No hurt  Therapy/Group: Individual Therapy  Mario [name], Mario [name] 11/02/2014, 12:35 PM

## 2014-11-02 NOTE — Progress Notes (Signed)
Occupational Therapy Session Note  Patient Details  Name: Mario [name] MRN: [id number] Date of Birth: May 04, 1976  Today's Date: 11/02/2014 OT Individual Time: 1330-1400 OT Individual Time Calculation (min): 30 min   Short Term Goals: Week 1:  OT Short Term Goal 1 (Week 1): Pt will perform LB dressing with Max A and mod A standing balance during task. OT Short Term Goal 1 - Progress (Week 1): Met OT Short Term Goal 2 (Week 1): Pt will perform toilet transfer with Mod A stand pivot in order to increase I with functional transfer. OT Short Term Goal 2 - Progress (Week 1): Met OT Short Term Goal 3 (Week 1): Pt will perform Mod A shower transfer onto TTB in order to decrease assistance needed with functional transfer.  OT Short Term Goal 3 - Progress (Week 1): Met OT Short Term Goal 4 (Week 1): Pt will utilize hand over hand technique for bathing with min cues in order to increase functional involvement of hemiplegic side.  OT Short Term Goal 4 - Progress (Week 1): Not met Week 2:  OT Short Term Goal 1 (Week 2): Pt will donn pullover shirt with min assist and mod demonstrational cueing for hemi techniques OT Short Term Goal 2 (Week 2): Pt will perform LB dressing with min assist sit to stand following hemi-techniques OT Short Term Goal 3 (Week 2): Pt will initiate and complete all bathing with no more than mod demonstrational cueing. OT Short Term Goal 4 (Week 2): Pt will utilize hand over hand technique for bathing with min cues in order to increase functional involvement of hemiplegic side.  OT Short Term Goal 5 (Week 2): [name] will return demonstrate safe assist with PROM exercises for the RUE and hand.  Skilled Therapeutic Interventions/Progress Updates:  Pt received seated in w/c with wife present. Patient propelled self from room > dayroom for therapeutic activity focusing on sit<>stands, dynamic standing, weight bearing > RUE during functional activity, following commands, and  overall activity tolerance/endurance. Attempted activity where pt reached for different colored magnets, this was unsuccessful due to decreased cognition. Therapist then had pt copy act of putting magnets back into container, pt required max multimodal cues to follow this command. Therapist then had pt work on writing his name and his wife's name, pt perseverated on writing only his name. Writing his wife's name was more successful when therapist had him copy it from the paper. Therapist then engaged pt in card game of war, pt more successful with this and got "winner vs loser" right about 50% of the time. Encouraged pt's wife to play card game of war with him while she is here visiting. At end of session, wife assisted pt back to room and therapist educated wife on patient's right inattention.   Therapy Documentation Precautions:  Precautions Precautions: Fall Precaution Comments: global aphasia, aspiration risk, dense R hemiplegia Restrictions Weight Bearing Restrictions: No  See FIM for current functional status  Therapy/Group: Individual Therapy  Marielis Samara , MS, OTR/L, CLT Pager: [phone]  11/02/2014, 3:04 PM

## 2014-11-02 NOTE — Progress Notes (Signed)
Physical Therapy Session Note  Patient Details  Name: Mario [name] MRN: [id number] Date of Birth: 1976/04/16  Today's Date: 11/02/2014 PT Individual Time: 1500-1600 PT Individual Time Calculation (min): 60 min   Short Term Goals: Week 2:  PT Short Term Goal 1 (Week 2): Pt will perform bed mobility with HOB flat and without rails at min A level with 50% cues to attend to RUE/LE.  PT Short Term Goal 2 (Week 2): Pt will perform functional transfers R and L at mod A level with 50% cues to attend to RUE/LE PT Short Term Goal 3 (Week 2): Pt will perform dynamic standing activity x 3 mins at mod A level and single UE support PT Short Term Goal 4 (Week 2): Pt will ambulate x 25' w/ LRAD and mod A with +2A for chair follow for safety.   Skilled Therapeutic Interventions/Progress Updates:   Pt received sitting in w/c in room, agreeable to therapy session.  Pt self propelled to/from therapy gym x 100' x 2 reps with R hemi technique at S level to continue to increase R attention and technique.  Once in therapy gym, skilled session focused on dynamic NMR with tall kneeling tasks with reaching forward, upward, and also to the L and R to work on sustained postural control, increased WB through RLE, increased weight shift to the L to decrease pusher tendencies.  Provided facilitation at trunk to remain upright and also to facilitate weight shift or trunk rotation.  Progressed to attempting tall kneeling to half kneeling with LLE, however requires full assist and +3 to get LLE fully into position.  Progressed to gait x 50' with LBQC at mod/max A with facilitation for forward weight shift over RLE, approximation through RLE to prevent buckle, and facilitation at trunk to decrease compensatory strategies when advancing RLE.  Pt able to advance RLE at all times but did need intermittent assist for placement.  Cues for continued breathing throughout session.  Ended session with seated nustep x 6 mins with BLEs only at  level 4 resistance for NMR through RLE and for reciprocal pattern to carry over to gait.  Tolerated well however needed tactile assist for maintaining R adduction.  Pt propelled back to room and discussed what pt had performed and how well he is progressing with wife.  Both very pleased.  All needs in reach.   Therapy Documentation Precautions:  Precautions Precautions: Fall Precaution Comments: global aphasia, aspiration risk, dense R hemiplegia Restrictions Weight Bearing Restrictions: No   Pain: Pain Assessment Pain Assessment: Faces Faces Pain Scale: No hurt  See FIM for current functional status  Therapy/Group: Individual Therapy  Vista Deckarcell, Jaxsyn Catalfamo Ann 11/02/2014, 1:06 PM

## 2014-11-02 NOTE — Progress Notes (Signed)
39 year old right handed male with history of hypertension maintained on hydrochlorothiazide. 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 He presented to [facility] 10/19/2014 with acute onset of right sided weakness, headache and inability to speak. Cranial CT scan negative. MRI of the head showed moderate volume of patchy acute infarct left MCA territory. Most confluent involvement at the corona radiata and lentiform nuclei. No mass effect. Carotid Dopplers with no ICA stenosis. 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 Collateral supply of left MCA branch vessels decreased in number and caliber in the area of acute infarct. Carotid Dopplers with ejection fraction of 65% without emboli Subjective/Complaints: Mom is with him today. Remains severely aphasic. Minimal movement right upper extremity noted  Does activate right lower extremity with weightbearing for PO  Review of Systems - unable to obtain secondary to severe aphasia  Objective: Vital Signs: Blood pressure 118/80, pulse 64, temperature 98.6 F (37 C), temperature source Oral, resp. rate 18, height 6' (1.829 m), weight 119 kg (262 lb 5.6 oz), SpO2 98 %. No results found. No results found for this or any previous visit (from the past 72 hour(s)).   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 no acute  distress   Assessment/Plan: 1. 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 Physiatrist is providing close team supervision and 24 hour management of active medical problems listed below. Physiatrist and rehab team continue to assess barriers to discharge/monitor patient progress toward functional and medical goals.  FIM: FIM - Bathing Bathing Steps Patient Completed: Chest, Left Arm, Abdomen, Front perineal area, Right upper leg, Left upper leg Bathing: 3: Mod-Patient completes 5-7 73f 10 parts or 50-74%  FIM - Upper Body Dressing/Undressing Upper body dressing/undressing steps patient completed: Thread/unthread left sleeve of pullover shirt/dress, Put head through opening of pull over shirt/dress, Pull shirt over trunk Upper body dressing/undressing: 4: Min-Patient completed 75 plus % of tasks FIM - Lower Body Dressing/Undressing Lower body dressing/undressing steps patient completed: Thread/unthread left pants leg, Don/Doff left shoe, Fasten/unfasten left shoe, Thread/unthread left underwear leg, Pull underwear up/down, Pull pants up/down Lower body dressing/undressing: 3: Mod-Patient completed 50-74% of tasks  FIM - Toileting Toileting steps completed by patient: Adjust clothing prior to toileting Toileting Assistive Devices: Grab bar or rail for support Toileting: 2: Max-Patient completed 1 of 3 steps  FIM - Diplomatic Services operational officer Devices: Grab bars Toilet Transfers: 4-To toilet/BSC: Min A (steadying Pt. > 75%), 4-From toilet/BSC: Min A (steadying Pt. > 75%)  FIM - Bed/Chair Transfer Bed/Chair Transfer Assistive Devices: Arm rests, Bed rails Bed/Chair Transfer: 3: Bed > Chair or W/C: Mod A (lift or lower assist), 3: Chair or W/C > Bed: Mod A (lift or lower assist), 3: Sit > Supine: Mod A (lifting assist/Pt.  50-74%/lift 2 legs)  FIM - Locomotion: Wheelchair Distance: 150 Locomotion: Wheelchair: 5: Travels 150 ft or more: maneuvers on rugs and over door sills with supervision, cueing or coaxing FIM - Locomotion: Ambulation Locomotion: Ambulation Assistive Devices: Occupational hygienistCane - Quad Ambulation/Gait Assistance: 3: Mod assist, 2: Max assist Locomotion: Ambulation: 2: Travels 50 - 149 ft with moderate assistance (Pt: 50 - 74%)  Comprehension Comprehension Mode: Auditory Comprehension: 3-Understands basic 50 - 74% of the time/requires cueing 25 - 50%  of the time  Expression Expression Mode: Nonverbal Expression: 1-Expresses basis less than 25% of the time/requires cueing greater than 75% of the time.  Social Interaction Social Interaction: 1-Interacts appropriately less than 25% of the time. May be withdrawn or combative.  Problem Solving Problem Solving: 3-Solves basic 50 - 74% of the time/requires cueing 25 - 49% of the time  Memory Memory: 2-Recognizes or recalls 25 - 49% of the time/requires cueing 51 - 75% of the time  Medical Problem List and Plan: 1. Functional deficits secondary to left MCA infarct,aphasia and apraxia as well 2. DVT Prophylaxis/Anticoagulation: Subcutaneous Lovenox. Monitor platelet counts and any signs of bleeding 3. Pain Management: Tylenol as needed 4. Dysphagia. Dysphagia 1 honey liquids. Monitor hydration, encourage po 5. Neuropsych: This patient is capable of making decisions on his own behalf. 6. Skin/Wound Care: Routine skin checks 7. Fluids/Electrolytes/Nutrition: Routine I/Os with follow-up chemistries 8. Hypertension. No current antihypertensive medication. Patient on hydrochlorothiazide 12.5 mg daily prior to admission. Monitor with increased mobility  LOS (Days) 12 A FACE TO FACE EVALUATION WAS PERFORMED  KIRSTEINS,ANDREW E 11/02/2014, 10:19 AM

## 2014-11-03 ENCOUNTER — Inpatient Hospital Stay (HOSPITAL_COMMUNITY): Payer: BC Managed Care – PPO

## 2014-11-03 ENCOUNTER — Inpatient Hospital Stay (HOSPITAL_COMMUNITY): Payer: BC Managed Care – PPO | Admitting: Occupational Therapy

## 2014-11-03 ENCOUNTER — Inpatient Hospital Stay (HOSPITAL_COMMUNITY): Payer: BC Managed Care – PPO | Admitting: Rehabilitation

## 2014-11-03 ENCOUNTER — Encounter (HOSPITAL_COMMUNITY): Payer: BC Managed Care – PPO | Admitting: Speech Pathology

## 2014-11-03 ENCOUNTER — Inpatient Hospital Stay (HOSPITAL_COMMUNITY): Payer: BC Managed Care – PPO | Admitting: Physical Therapy

## 2014-11-03 NOTE — Progress Notes (Signed)
Occupational Therapy Session Note  Patient Details  Name: Mario [name] MRN: [id number] Date of Birth: 10-Mar-1976  Today's Date: 11/03/2014 OT Individual Time: 1100-1200 OT Individual Time Calculation (min): 60 min    Short Term Goals: Week 2:  OT Short Term Goal 1 (Week 2): Pt will donn pullover shirt with min assist and mod demonstrational cueing for hemi techniques OT Short Term Goal 2 (Week 2): Pt will perform LB dressing with min assist sit to stand following hemi-techniques OT Short Term Goal 3 (Week 2): Pt will initiate and complete all bathing with no more than mod demonstrational cueing. OT Short Term Goal 4 (Week 2): Pt will utilize hand over hand technique for bathing with min cues in order to increase functional involvement of hemiplegic side.  OT Short Term Goal 5 (Week 2): [name] will return demonstrate safe assist with PROM exercises for the RUE and hand.  Skilled Therapeutic Interventions/Progress Updates:    Pt resting in bed with wife present upon arrival.  Pt's wife stated that she already assisted patient with sponge bath and dressing in preparation for therapy.  Wife stated that patient indicated he did not want to wait to clean up.  Pt propelled to therapy gym and initially engaged in RUE NMR including variety of weight bearing activities in various planes.  Pt transitioned to RUE AAROM on balanced stool to facilitate initiation of shoulder flexion.  Trace movement detected.  Pt engaged in sit<>stand without BUE support and squats.  Pt engaged in dynamic standing tasks requiring increased weight bearing on RLE.  Pt propelled back to room and remained in w/c with wife present.    Therapy Documentation Precautions:  Precautions Precautions: Fall Precaution Comments: global aphasia, aspiration risk, dense R hemiplegia Restrictions Weight Bearing Restrictions: No General:   Vital Signs:   Pain: Pain Assessment Pain Assessment: Faces Faces Pain Scale: No  hurt  See FIM for current functional status  Therapy/Group: Individual Therapy  Rich BraveLanier, Fredrick Geoghegan Chappell 11/03/2014, 12:13 PM

## 2014-11-03 NOTE — Progress Notes (Signed)
Speech Language Pathology Daily Session Note  Patient Details  Name: Mario [name] MRN: [id number] Date of Birth: [birth year]  Today's Date: 11/03/2014 SLP Individual Time: 1002-1030 SLP Individual Time Calculation (min): 30 min  Short Term Goals: Week 2: SLP Short Term Goal 1 (Week 2): Pt will answer basic, immediate, biographical and/or environmental yes/no questions with max assist gestural cuing.   SLP Short Term Goal 2 (Week 2): Pt will follow 1-step directions in the context of a basic, familiar task with mod assist verbal cuing.  SLP Short Term Goal 3 (Week 2): Pt will initiate vocalizations to produce vowels and/or CV syllables with max assist visual and verbal cuing.  SLP Short Term Goal 4 (Week 2): Pt will return demonstration of the call bell with mod assist verbal cuing.  SLP Short Term Goal 5 (Week 2): Pt will consume his currently presecribed diet with supervision cues for use of rate and portion control, liquid wash, lingual sweep, and manual assist to monitor and correct right sided buccal residue.   Skilled Therapeutic Interventions:  Pt was seen for skilled education following MBS.  Pt's wife, Rudene Andaangii was present for the duration of today's therapy session.  SLP provided extensive education regarding pt's current pharyngeal and oral phase deficits and their impact on his swallowing safety.  SLP also explained rationale behind recommended swallowing precautions as they pertain to the results of today's MBS.  Pt was observed with cup sips of thin liquids and demonstrated no overt s/s of aspiration and was able to achieve brief voicing for SLP to monitor changes in vocal quality.  Pt's voice remained weak, hoarse, and breathy but clear following cup sips of water.  Recommend that pt's diet be upgraded to thin liquids with continued dys 2 textures and full supervision.  Continue per current plan of care.    FIM:  Comprehension Comprehension Mode: Auditory Comprehension:  3-Understands basic 50 - 74% of the time/requires cueing 25 - 50%  of the time Expression Expression Mode: Nonverbal Expression: 2-Expresses basic 25 - 49% of the time/requires cueing 50 - 75% of the time. Uses single words/gestures. Social Interaction Social Interaction: 4-Interacts appropriately 75 - 89% of the time - Needs redirection for appropriate language or to initiate interaction. Problem Solving Problem Solving: 3-Solves basic 50 - 74% of the time/requires cueing 25 - 49% of the time Memory Memory: 2-Recognizes or recalls 25 - 49% of the time/requires cueing 51 - 75% of the time  Pain Pain Assessment Pain Assessment: Faces Faces Pain Scale: No hurt  Therapy/Group: Individual Therapy  Kamil Hanigan, Melanee SpryNicole L 11/03/2014, 3:42 PM

## 2014-11-03 NOTE — Progress Notes (Signed)
Speech Language Pathology Note  Patient Details  Name: Mellody DrownJerome A Toran MRN: [id number] Date of Birth: Mar 15, 1976 Today's Date: 11/03/2014  MBSS complete. Full report located under chart review in imaging section.    Conny Situ, Melanee SpryNicole L 11/03/2014, 3:43 PM

## 2014-11-03 NOTE — Progress Notes (Signed)
Occupational Therapy Session Note  Patient Details  Name: Mario DrownJerome A Nelligan MRN: [id number] Date of Birth: [birth year]  Today's Date: 11/03/2014 OT Individual Time: 1331-1404 OT Individual Time Calculation (min): 33 min    Skilled Therapeutic Interventions/Progress Updates:    Used session for education on stand pivot transfers from wheelchair to bed with pt's father.  Attempted squat pivot transfers, however pt unable to understand visual and verbal cueing to follow this.  When attempting squat pivot transfer he just tends to stand up instead.  Pt's father able to return demonstrate safe assist with stand pivot transfers and is checked off to perform toilet transfers with pt in the room.    Therapy Documentation Precautions:  Precautions Precautions: Fall Precaution Comments: global aphasia, aspiration risk, dense R hemiplegia Restrictions Weight Bearing Restrictions: No  Pain: Pain Assessment Pain Assessment: No/denies pain Faces Pain Scale: No hurt ADL: See FIM for current functional status  Therapy/Group: Individual Therapy  Tyge Somers OTR/L 11/03/2014, 3:57 PM

## 2014-11-03 NOTE — Progress Notes (Signed)
39 year old right handed male with history of hypertension maintained on hydrochlorothiazide. 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 He presented to [facility] 10/19/2014 with acute onset of right sided weakness, headache and inability to speak. Cranial CT scan negative. MRI of the head showed moderate volume of patchy acute infarct left MCA territory. Most confluent involvement at the corona radiata and lentiform nuclei. No mass effect. Carotid Dopplers with no ICA stenosis. 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 Collateral supply of left MCA branch vessels decreased in number and caliber in the area of acute infarct. Carotid Dopplers with ejection fraction of 65% without emboli Subjective/Complaints: Wife is with him today. Remains severely aphasic.Mouthing words on occ  Review of Systems - unable to obtain secondary to severe aphasia  Objective: Vital Signs: Blood pressure 113/75, pulse 66, temperature 98.3 F (36.8 C), temperature source Oral, resp. rate 18, height 6' (1.829 m), weight 119 kg (262 lb 5.6 oz), SpO2 95 %. Dg Swallowing Func-speech Pathology  11/03/2014    Objective Swallowing Evaluation:    Patient Details  Name: Mario [name] MRN: [id number] Date of Birth: 1976/02/28  Today's Date: 11/03/2014 Time: No Data Recorded-No Data Recorded No Data Recorded  Past Medical History:  Past Medical History  Diagnosis Date  . Hypertension    Past Surgical History:  Past Surgical History  Procedure Laterality Date  . No past surgeries     HPI:  No Data Recorded  No Data Recorded  Assessment / Plan / Recommendation CHL IP CLINICAL IMPRESSIONS 11/03/2014  Therapy Diagnosis Moderate pharyngeal phase dysphagia;Mild oral phase  dysphagia  Clinical Impression Pt presents with a mild-moderate oropharyngeal  dysphagia with both sensory and motor components.  Oral phase deficits  were characterized by right sided labial, lingual, and buccal weakness  which resulted  in weakened manipulation of boluses, and prolonged transit  of materials to the oropharynx which lead to anterior labial loss of  liquids and right sided buccal residue left in the oral cavity post  swallow.  The abovementioned deficits, in addition to decreased pharyngeal  sensation lead to delayed swallow initiation with response triggered at  the level of the vallecula across all consistencies.  Pt was also noted  with decreased hyolaryngeal excursion and base of tongue retraction which  lead to mild-moderate residue across all consistencies at the level of the  vallecula and pyriform sinuses.  Despite his pharyngeal based impairments,  pt was noted with relatively good airway protection and no significant  penetration  was visualized across any consistencies assessed.  However,  pt remains at risk for aspiration/penetration of pharyngeal residuals  after the swallow; therefore, recommend that pt's diet be upgraded to thin  liquids with continued dys 2 textures and full supervision for use of the  following swallowing precautions to minimize risk of aspiration: out of  bed for meals, small bites and sips, slow rate, no straws, alternate  solids and liquids, and attempt extra swallows as pt is able.  Pt's  prognosis for advancement is good with continued ST follow up to monitor  diet toleration and safe diet progression.        No flowsheet data found.   CHL IP DIET RECOMMENDATION 11/03/2014  SLP Diet Recommendations Dysphagia 2 (Fine chop);Thin     Medication Administration Crushed with puree  Compensations Check for pocketing;Check for anterior loss;Follow solids  with liquid  SLP Swallow Goals     CHL IP REASON FOR REFERRAL 11/03/2014  Reason for Referral Objectively evaluate swallowing function     CHL IP ORAL PHASE 11/03/2014  Lips (None)  Tongue (None)  Mucous membranes (None)  Nutritional status (None)  Other (None)  Oxygen therapy (None)  Oral Phase Impaired  Oral - Pudding Teaspoon (None)   Oral - Pudding Cup (None)  Oral - Honey Teaspoon (None)  Oral - Honey Cup (None)  Oral - Honey Syringe (None)  Oral - Nectar Teaspoon (None)  Oral - Nectar Cup (None)  Oral - Nectar Straw (None)  Oral - Nectar Syringe (None)  Oral - Ice Chips (None)  Oral - Thin Teaspoon (None)  Oral - Thin Cup (None)  Oral - Thin Straw (None)  Oral - Thin Syringe (None)  Oral - Puree (None)  Oral - Mechanical Soft (None)  Oral - Regular (None)  Oral - Multi-consistency (None)  Oral - Pill (None)  Oral Phase - Comment (None)      CHL IP PHARYNGEAL PHASE 11/03/2014  Pharyngeal Phase Impaired  Pharyngeal - Pudding Teaspoon (None)  Penetration/Aspiration details (pudding teaspoon) (None)  Pharyngeal - Pudding Cup (None)  Penetration/Aspiration details (pudding cup) (None)  Pharyngeal - Honey Teaspoon (None)  Penetration/Aspiration details (honey teaspoon) (None)  Pharyngeal - Honey Cup (None)  Penetration/Aspiration details (honey cup) (None)  Pharyngeal - Honey Syringe (None)  Penetration/Aspiration details (honey syringe) (None)  Pharyngeal - Nectar Teaspoon (None)  Penetration/Aspiration details (nectar teaspoon) (None)  Pharyngeal - Nectar Cup (None)  Penetration/Aspiration details (nectar cup) (None)  Pharyngeal - Nectar Straw (None)  Penetration/Aspiration details (nectar straw) (None)  Pharyngeal - Nectar Syringe (None)  Penetration/Aspiration details (nectar syringe) (None)  Pharyngeal - Ice Chips (None)  Penetration/Aspiration details (ice chips) (None)  Pharyngeal - Thin Teaspoon (None)  Penetration/Aspiration details (thin teaspoon) (None)  Pharyngeal - Thin Cup (None)  Penetration/Aspiration details (thin cup) (None)  Pharyngeal - Thin Straw (None)  Penetration/Aspiration details (thin straw) (None)  Pharyngeal - Thin Syringe (None)  Penetration/Aspiration details (thin syringe') (None)  Pharyngeal - Puree (None)  Penetration/Aspiration details (puree) (None)  Pharyngeal - Mechanical Soft (None)  Penetration/Aspiration  details (mechanical soft) (None)  Pharyngeal - Regular (None)  Penetration/Aspiration details (regular) (None)  Pharyngeal - Multi-consistency (None)  Penetration/Aspiration details (multi-consistency) (None)  Pharyngeal - Pill (None)  Penetration/Aspiration details (pill) (None)  Pharyngeal Comment (None)      CHL IP CERVICAL ESOPHAGEAL PHASE 11/03/2014  Cervical Esophageal Phase WFL  Pudding Teaspoon (None)  Pudding Cup (None)  Honey Teaspoon (None)  Honey Cup (None)  Honey Straw (None)  Nectar Teaspoon (None)  Nectar Cup (None)  Nectar Straw (None)  Nectar Sippy Cup (None)  Thin Teaspoon (None)  Thin Cup (None)  Thin Straw (None)  Thin Sippy Cup (None)  Cervical Esophageal Comment (None)    No flowsheet data found.         Page, Selinda Orion 11/03/2014, 9:56 AM    Results for orders placed or performed during the hospital encounter of 10/21/14 (from the past 72 hour(s))  Basic metabolic panel     Status: Abnormal   Collection Time: 11/02/14 10:40 AM  Result Value Ref Range   Sodium 140 135 - 145 mmol/L   Potassium 3.9 3.5 - 5.1 mmol/L   Chloride 101 101 - 111 mmol/L   CO2 26 22 - 32 mmol/L   Glucose, Bld 100 (H) 65 - 99 mg/dL   BUN 18 6 - 20 mg/dL  Creatinine, Ser 1.24 0.61 - 1.24 mg/dL   Calcium 9.7 8.9 - 10.3 mg/dL   GFR calc non Af Amer >60 >60 mL/min   GFR calc Af Amer >60 >60 mL/min    Comment: (NOTE) The eGFR has been calculated using the CKD EPI equation. This calculation has not been validated in all clinical situations. eGFR's persistently <60 mL/min signify possible Chronic Kidney Disease.    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 no acute distress   Assessment/Plan: 1. 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 Physiatrist is providing close team supervision and 24 hour management of active medical problems listed below. Physiatrist and rehab team continue to assess barriers to discharge/monitor patient progress toward functional and medical goals.  FIM: FIM - Bathing Bathing Steps Patient Completed: Chest, Left Arm, Abdomen, Front perineal area, Right upper leg, Left upper leg, Buttocks Bathing: 3: Mod-Patient completes 5-7 66f10 parts or 50-74%  FIM - Upper Body Dressing/Undressing Upper body dressing/undressing steps patient completed: Thread/unthread left sleeve of pullover shirt/dress, Put head through opening of pull over shirt/dress, Pull shirt over trunk Upper body dressing/undressing: 4: Min-Patient completed 75 plus % of tasks FIM - Lower Body Dressing/Undressing Lower body dressing/undressing steps patient completed: Thread/unthread left pants leg, Don/Doff left shoe, Fasten/unfasten left shoe, Thread/unthread left underwear leg, Pull underwear up/down, Pull pants up/down Lower body dressing/undressing: 3: Mod-Patient completed 50-74% of tasks  FIM - Toileting Toileting steps completed by patient: Adjust clothing prior to toileting Toileting Assistive Devices: Grab bar or rail for support Toileting: 2: Max-Patient completed 1 of 3 steps  FIM - TRadio producerDevices: Grab bars Toilet Transfers: 4-To  toilet/BSC: Min A (steadying Pt. > 75%), 4-From toilet/BSC: Min A (steadying Pt. > 75%)  FIM - Bed/Chair Transfer Bed/Chair Transfer Assistive Devices: Arm rests, Bed rails Bed/Chair Transfer: 3: Bed > Chair or W/C: Mod A (lift or lower assist), 3: Chair or W/C > Bed: Mod A (lift or lower assist), 3: Sit > Supine: Mod A (lifting assist/Pt. 50-74%/lift 2 legs)  FIM - Locomotion: Wheelchair Distance: 100 Locomotion: Wheelchair: 2: Travels 50 - 149 ft with supervision, cueing or coaxing FIM - Locomotion: Ambulation Locomotion: Ambulation Assistive Devices: CNurse, adultAmbulation/Gait Assistance: 3: Mod assist, 2: Max assist Locomotion: Ambulation: 1: Travels less than 50 ft with maximal assistance (Pt: 25 - 49%)  Comprehension Comprehension Mode: Auditory Comprehension: 3-Understands basic 50 - 74% of the time/requires cueing 25 - 50%  of the time  Expression Expression Mode: Nonverbal Expression: 2-Expresses basic 25 - 49% of the time/requires cueing 50 - 75% of the time. Uses single words/gestures.  Social Interaction Social Interaction: 4-Interacts appropriately 75 -  89% of the time - Needs redirection for appropriate language or to initiate interaction.  Problem Solving Problem Solving: 3-Solves basic 50 - 74% of the time/requires cueing 25 - 49% of the time  Memory Memory: 2-Recognizes or recalls 25 - 49% of the time/requires cueing 51 - 75% of the time  Medical Problem List and Plan: 1. Functional deficits secondary to left MCA infarct,aphasia and apraxia as well 2. DVT Prophylaxis/Anticoagulation: Subcutaneous Lovenox. Monitor platelet counts and any signs of bleeding 3. Pain Management: Tylenol as needed 4. Dysphagia. Dysphagia 1 honey liquids. Monitor hydration, encourage po 5. Neuropsych: This patient is capable of making decisions on his own behalf. 6. Skin/Wound Care: Routine skin checks 7. Fluids/Electrolytes/Nutrition: Routine I/Os with follow-up chemistries 8.  Hypertension. No current antihypertensive medication. Patient on hydrochlorothiazide 12.5 mg daily prior to admission. Monitor with increased mobility  LOS (Days) 13 A FACE TO FACE EVALUATION WAS PERFORMED  KIRSTEINS,ANDREW E 11/03/2014, 2:06 PM

## 2014-11-03 NOTE — Progress Notes (Signed)
Physical Therapy Session Note  Patient Details  Name: Mellody DrownJerome A Printup MRN: [id number] Date of Birth: 1975/11/17  Today's Date: 11/03/2014 PT Individual Time: 1500-1600 PT Individual Time Calculation (min): 60 min   Short Term Goals: Week 2:  PT Short Term Goal 1 (Week 2): Pt will perform bed mobility with HOB flat and without rails at min A level with 50% cues to attend to RUE/LE.  PT Short Term Goal 2 (Week 2): Pt will perform functional transfers R and L at mod A level with 50% cues to attend to RUE/LE PT Short Term Goal 3 (Week 2): Pt will perform dynamic standing activity x 3 mins at mod A level and single UE support PT Short Term Goal 4 (Week 2): Pt will ambulate x 25' w/ LRAD and mod A with +2A for chair follow for safety.   Skilled Therapeutic Interventions/Progress Updates:   Pt received sitting in w/c in room, father present but did not attend session.  Skilled session focused on gait with use of LiteGait body weight support system over treadmill to address automatic tasks, reciprocal pattern, midline orientation, improved quality of weight shifting to the L, increased forward weight shift over RLE during stance phase of gait, and upright posture.  Performed total of 283' for 9:15, at speed of 0.3-0.4 mph with two standing rest breaks and single seated rest break to don tennis shoes with R blue rocker AFO which seemed to provide increased support vs slip on shoes.  Ended session with gait on controlled surface to assess carryover from treadmill.  Pt ambulated x 6965' with LBQC at mod A level with ability to advance RLE on his own and control quads approx 80% of time with tapping to quads proving to be helpful for activation during stance.  Also provided facilitation at trunk for upright posture and decreased trunk extension as compensation for advancing RLE.  Good recall of sequencing with quad cane.  Once seated to rest, provided pt with water and attempted to work on pts verbalizations.  Pt  able to initiate sounds with improved breath sounds, but only able to state one full word Clifton Custard("Aaron") during session.  Assisted back to room and left in w/c with father present.    Therapy Documentation Precautions:  Precautions Precautions: Fall Precaution Comments: global aphasia, aspiration risk, dense R hemiplegia Restrictions Weight Bearing Restrictions: No   Pain: Pain Assessment Pain Assessment: Faces Faces Pain Scale: No hurt  See FIM for current functional status  Therapy/Group: Individual Therapy  Vista Deckarcell, Tatem Fesler Ann 11/03/2014, 12:23 PM

## 2014-11-04 ENCOUNTER — Inpatient Hospital Stay (HOSPITAL_COMMUNITY): Payer: BC Managed Care – PPO | Admitting: Occupational Therapy

## 2014-11-04 ENCOUNTER — Inpatient Hospital Stay (HOSPITAL_COMMUNITY): Payer: BC Managed Care – PPO | Admitting: Speech Pathology

## 2014-11-04 ENCOUNTER — Inpatient Hospital Stay (HOSPITAL_COMMUNITY): Payer: BC Managed Care – PPO | Admitting: Rehabilitation

## 2014-11-04 NOTE — Progress Notes (Signed)
Speech Language Pathology Daily Session Note  Patient Details  Name: Mario [name] MRN: [id number] Date of Birth: Jun 19, 1975  Today's Date: 11/04/2014 SLP Individual Time: [id number] SLP Individual Time Calculation (min): 62 min  Short Term Goals: Week 2: SLP Short Term Goal 1 (Week 2): Pt will answer basic, immediate, biographical and/or environmental yes/no questions with max assist gestural cuing.   SLP Short Term Goal 2 (Week 2): Pt will follow 1-step directions in the context of a basic, familiar task with mod assist verbal cuing.  SLP Short Term Goal 3 (Week 2): Pt will initiate vocalizations to produce vowels and/or CV syllables with max assist visual and verbal cuing.  SLP Short Term Goal 4 (Week 2): Pt will return demonstration of the call bell with mod assist verbal cuing.  SLP Short Term Goal 5 (Week 2): Pt will consume his currently presecribed diet with supervision cues for use of rate and portion control, liquid wash, lingual sweep, and manual assist to monitor and correct right sided buccal residue.   Skilled Therapeutic Interventions:  Pt was seen for skilled ST targeting goals for functional communication and dysphagia.  Upon arrival, pt was seated upright in wheelchair, awake, alert, and agreeable to participate in ST.  SLP facilitated the session with skilled observations during presentations of cup sips of regular water.  Pt required x1 min assist verbal cue for rate and portion control.  No overt s/s of aspiration were evident following cup sips of thins and pt was able to phonate on command to demonstrate clear vocal quality post swallow.  SLP also facilitated the session with skilled apraxia drills targeting successful production of targeted phonemes.  Pt produced /m, u, e, and a/ in isolation with mod assist demonstration cues for awareness of verbal errors.  Pt was also stimulable for production of voiceless to voiced CV syllables (/ha/ and /who/) with max assist demonstration  cues and use of manipulatives (i.e. Use of mirror to provide visual feedback for directing air through the upper airway.  Pt currently is perseverative on producing an approximation of the word "hurry" for which he requires max assist verbal and visual cues to correct.  Pt continues to make good progress towards meeting goals.  Continue per current plan of care.   FIM:  Comprehension Comprehension Mode: Auditory Comprehension: 3-Understands basic 50 - 74% of the time/requires cueing 25 - 50%  of the time Expression Expression Mode: Nonverbal Expression: 2-Expresses basic 25 - 49% of the time/requires cueing 50 - 75% of the time. Uses single words/gestures. Social Interaction Social Interaction: 4-Interacts appropriately 75 - 89% of the time - Needs redirection for appropriate language or to initiate interaction. Problem Solving Problem Solving: 3-Solves basic 50 - 74% of the time/requires cueing 25 - 49% of the time Memory Memory: 2-Recognizes or recalls 25 - 49% of the time/requires cueing 51 - 75% of the time  Pain Pain Assessment Pain Assessment: Faces Faces Pain Scale: No hurt  Therapy/Group: Individual Therapy  Jeziel Hoffmann, Melanee SpryNicole L 11/04/2014, 12:49 PM

## 2014-11-04 NOTE — Progress Notes (Signed)
Recreational Therapy Session Note  Patient Details  Name: Mario [name] MRN: [id number] Date of Birth: [birth year] Today's Date: 11/04/2014  Pain: no indication of pain Skilled Therapeutic Interventions/Progress Updates: Session focused on activity tolerance, dynamic standing balance, weight shifting during co-treat with PT.  Pt performed modified ambulation with RLE on floor and LLE/knee up on mat while negotiating around the perimeter of the mat working on increasing weight shift onto LLE.  Transitioned to modified basketball task placing bean bags into basketball hoop while in the same position.  All activities required +2 assist for safe completion.  Therapy/Group: Co-Treatment  Leodis Alcocer 11/04/2014, 3:54 PM

## 2014-11-04 NOTE — Progress Notes (Signed)
He presented to [facility] 10/19/2014 with acute onset of right sided weakness, headache and inability to speak. Cranial CT scan negative. MRI of the head showed moderate volume of patchy acute infarct left MCA territory. Most confluent involvement at the corona radiata and lentiform nuclei. No mass effect. Subjective/Complaints:  Patient is unable to voice complaints due to a aphasia. Mario [name] is with him today.patient independently washes his right hand with soap and water and dries it off.   Review of Systems - unable to obtain secondary to severe aphasia  Objective: Vital Signs: Blood pressure 106/66, pulse 73, temperature 97.9 F (36.6 C), temperature source Oral, resp. rate 18, height 6' (1.829 m), weight 117 kg (257 lb 15 oz), SpO2 96 %. Dg Swallowing Func-speech Pathology  11/03/2014    Objective Swallowing Evaluation:    Patient Details  Name: Mario [name] MRN: [id number] Date of Birth: 12-Mar-1976  Today's Date: 11/03/2014 Time: No Data Recorded-No Data Recorded No Data Recorded  Past Medical History:  Past Medical History  Diagnosis Date  . Hypertension    Past Surgical History:  Past Surgical History  Procedure Laterality Date  . No past surgeries     HPI:  No Data Recorded  No Data Recorded  Assessment / Plan / Recommendation CHL IP CLINICAL IMPRESSIONS 11/03/2014  Therapy Diagnosis Moderate pharyngeal phase dysphagia;Mild oral phase  dysphagia  Clinical Impression Pt presents with a mild-moderate oropharyngeal  dysphagia with both sensory and motor components.  Oral phase deficits  were characterized by right sided labial, lingual, and buccal weakness  which resulted in weakened manipulation of boluses, and prolonged transit  of materials to the oropharynx which lead to anterior labial loss of  liquids and right sided buccal residue left in the oral cavity post  swallow.  The abovementioned deficits, in addition to decreased pharyngeal  sensation lead to delayed swallow initiation  with response triggered at  the level of the vallecula across all consistencies.  Pt was also noted  with decreased hyolaryngeal excursion and base of tongue retraction which  lead to mild-moderate residue across all consistencies at the level of the  vallecula and pyriform sinuses.  Despite his pharyngeal based impairments,  pt was noted with relatively good airway protection and no significant  penetration  was visualized across any consistencies assessed.  However,  pt remains at risk for aspiration/penetration of pharyngeal residuals  after the swallow; therefore, recommend that pt's diet be upgraded to thin  liquids with continued dys 2 textures and full supervision for use of the  following swallowing precautions to minimize risk of aspiration: out of  bed for meals, small bites and sips, slow rate, no straws, alternate  solids and liquids, and attempt extra swallows as pt is able.  Pt's  prognosis for advancement is good with continued ST follow up to monitor  diet toleration and safe diet progression.        No flowsheet data found.   CHL IP DIET RECOMMENDATION 11/03/2014  SLP Diet Recommendations Dysphagia 2 (Fine chop);Thin     Medication Administration Crushed with puree  Compensations Check for pocketing;Check for anterior loss;Follow solids  with liquid                     SLP Swallow Goals     CHL IP REASON FOR REFERRAL 11/03/2014  Reason for Referral Objectively evaluate swallowing function     CHL IP ORAL PHASE 11/03/2014  Lips (None)  Tongue (None)  Mucous  membranes (None)  Nutritional status (None)  Other (None)  Oxygen therapy (None)  Oral Phase Impaired  Oral - Pudding Teaspoon (None)  Oral - Pudding Cup (None)  Oral - Honey Teaspoon (None)  Oral - Honey Cup (None)  Oral - Honey Syringe (None)  Oral - Nectar Teaspoon (None)  Oral - Nectar Cup (None)  Oral - Nectar Straw (None)  Oral - Nectar Syringe (None)  Oral - Ice Chips (None)  Oral - Thin Teaspoon (None)  Oral - Thin Cup (None)  Oral - Thin  Straw (None)  Oral - Thin Syringe (None)  Oral - Puree (None)  Oral - Mechanical Soft (None)  Oral - Regular (None)  Oral - Multi-consistency (None)  Oral - Pill (None)  Oral Phase - Comment (None)      CHL IP PHARYNGEAL PHASE 11/03/2014  Pharyngeal Phase Impaired  Pharyngeal - Pudding Teaspoon (None)  Penetration/Aspiration details (pudding teaspoon) (None)  Pharyngeal - Pudding Cup (None)  Penetration/Aspiration details (pudding cup) (None)  Pharyngeal - Honey Teaspoon (None)  Penetration/Aspiration details (honey teaspoon) (None)  Pharyngeal - Honey Cup (None)  Penetration/Aspiration details (honey cup) (None)  Pharyngeal - Honey Syringe (None)  Penetration/Aspiration details (honey syringe) (None)  Pharyngeal - Nectar Teaspoon (None)  Penetration/Aspiration details (nectar teaspoon) (None)  Pharyngeal - Nectar Cup (None)  Penetration/Aspiration details (nectar cup) (None)  Pharyngeal - Nectar Straw (None)  Penetration/Aspiration details (nectar straw) (None)  Pharyngeal - Nectar Syringe (None)  Penetration/Aspiration details (nectar syringe) (None)  Pharyngeal - Ice Chips (None)  Penetration/Aspiration details (ice chips) (None)  Pharyngeal - Thin Teaspoon (None)  Penetration/Aspiration details (thin teaspoon) (None)  Pharyngeal - Thin Cup (None)  Penetration/Aspiration details (thin cup) (None)  Pharyngeal - Thin Straw (None)  Penetration/Aspiration details (thin straw) (None)  Pharyngeal - Thin Syringe (None)  Penetration/Aspiration details (thin syringe') (None)  Pharyngeal - Puree (None)  Penetration/Aspiration details (puree) (None)  Pharyngeal - Mechanical Soft (None)  Penetration/Aspiration details (mechanical soft) (None)  Pharyngeal - Regular (None)  Penetration/Aspiration details (regular) (None)  Pharyngeal - Multi-consistency (None)  Penetration/Aspiration details (multi-consistency) (None)  Pharyngeal - Pill (None)  Penetration/Aspiration details (pill) (None)  Pharyngeal Comment (None)      CHL IP  CERVICAL ESOPHAGEAL PHASE 11/03/2014  Cervical Esophageal Phase WFL  Pudding Teaspoon (None)  Pudding Cup (None)  Honey Teaspoon (None)  Honey Cup (None)  Honey Straw (None)  Nectar Teaspoon (None)  Nectar Cup (None)  Nectar Straw (None)  Nectar Sippy Cup (None)  Thin Teaspoon (None)  Thin Cup (None)  Thin Straw (None)  Thin Sippy Cup (None)  Cervical Esophageal Comment (None)    No flowsheet data found.         Page, Selinda Orion 11/03/2014, 9:56 AM    Results for orders placed or performed during the hospital encounter of 10/21/14 (from the past 72 hour(s))  Basic metabolic panel     Status: Abnormal   Collection Time: 11/02/14 10:40 AM  Result Value Ref Range   Sodium 140 135 - 145 mmol/L   Potassium 3.9 3.5 - 5.1 mmol/L   Chloride 101 101 - 111 mmol/L   CO2 26 22 - 32 mmol/L   Glucose, Bld 100 (H) 65 - 99 mg/dL   BUN 18 6 - 20 mg/dL   Creatinine, Ser 1.24 0.61 - 1.24 mg/dL   Calcium 9.7 8.9 - 10.3 mg/dL   GFR calc non Af Amer >60 >60 mL/min   GFR calc Af Amer >60 >60 mL/min  Comment: (NOTE) The eGFR has been calculated using the CKD EPI equation. This calculation has not been validated in all clinical situations. eGFR's persistently <60 mL/min signify possible Chronic Kidney Disease.    Anion gap 13 5 - 15    Comment: Performed at [facility]: normal Cardio: RRR and no murmur Resp: CTA B/L and unlabored GI: BS positive and NT.ND Extremity:  Edema Mild Right Hand Skin:   Intact Neuro: Alert/Oriented, Abnormal Sensory Reduced sensation to pinch on the right side, Abnormal Motor 0/5 on muscle testing in the right upper and right lower extremity,  , Aphasic and Other difficult to evaluate visual fields secondary to severe aphasia, may have some right inattention Tone is increasing at the right elbow flexor as well as right pronator Ashworth grade 2, finger flexor tone is normal Patient appears to have apraxia,  Musc/Skel:  Other no pain with upper extremity  or lower extremity active and passive range of motion Gen. no acute distress   Assessment/Plan: 1. 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 Physiatrist is providing close team supervision and 24 hour management of active medical problems listed below. Physiatrist and rehab team continue to assess barriers to discharge/monitor patient progress toward functional and medical goals. Team conference today please see physician documentation under team conference tab, met with team face-to-face to discuss problems,progress, and goals. Formulized individual treatment plan based on medical history, underlying problem and comorbidities.  FIM: FIM - Bathing Bathing Steps Patient Completed: Chest, Left Arm, Abdomen, Front perineal area, Right upper leg, Left upper leg, Buttocks Bathing: 3: Mod-Patient completes 5-7 [id number] parts or 50-74%  FIM - Upper Body Dressing/Undressing Upper body dressing/undressing steps patient completed: Thread/unthread left sleeve of pullover shirt/dress, Put head through opening of pull over shirt/dress, Pull shirt over trunk Upper body dressing/undressing: 4: Min-Patient completed 75 plus % of tasks FIM - Lower Body Dressing/Undressing Lower body dressing/undressing steps patient completed: Thread/unthread left pants leg, Don/Doff left shoe, Fasten/unfasten left shoe, Thread/unthread left underwear leg, Pull underwear up/down, Pull pants up/down Lower body dressing/undressing: 3: Mod-Patient completed 50-74% of tasks  FIM - Toileting Toileting steps completed by patient: Adjust clothing prior to toileting Toileting Assistive Devices: Grab bar or rail for support Toileting: 2: Max-Patient completed 1 of 3 steps  FIM - TRadio producerDevices: Grab bars Toilet Transfers: 4-To toilet/BSC:  Min A (steadying Pt. > 75%), 4-From toilet/BSC: Min A (steadying Pt. > 75%)  FIM - Bed/Chair Transfer Bed/Chair Transfer Assistive Devices: Arm rests, Bed rails Bed/Chair Transfer: 0: Activity did not occur  FIM - Locomotion: Wheelchair Distance: 100 Locomotion: Wheelchair: 0: Activity did not occur FIM - Locomotion: Ambulation Locomotion: Ambulation Assistive Devices: CNurse, adultAmbulation/Gait Assistance: 3: Mod assist Locomotion: Ambulation: 2: Travels 50 - 149 ft with moderate assistance (Pt: 50 - 74%)  Comprehension Comprehension Mode: Auditory Comprehension: 3-Understands basic 50 - 74% of the time/requires cueing 25 - 50%  of the time  Expression Expression Mode: Nonverbal Expression: 2-Expresses basic 25 - 49% of the time/requires cueing 50 - 75% of the time. Uses single words/gestures.  Social Interaction Social Interaction: 4-Interacts appropriately 75 - 89% of the time - Needs redirection for appropriate language or to initiate interaction.  Problem Solving Problem Solving: 3-Solves basic 50 - 74% of the time/requires cueing 25 - 49% of the  time  Memory Memory: 2-Recognizes or recalls 25 - 49% of the time/requires cueing 51 - 75% of the time  Medical Problem List and Plan: 1. Functional deficits secondary to left MCA infarct,aphasia and apraxia as well 2. DVT Prophylaxis/Anticoagulation: Subcutaneous Lovenox. Monitor platelet counts and any signs of bleeding 3. Pain Management: Tylenol as needed 4. Dysphagia. Dysphagia 1 honey liquids. Monitor hydration, encourage po 5. Neuropsych: This patient is capable of making decisions on his own behalf. 6. Skin/Wound Care: Routine skin checks 7. Fluids/Electrolytes/Nutrition: Routine I/Os with follow-up chemistries 8. Hypertension. No current antihypertensive medication. Patient on hydrochlorothiazide 12.5 mg daily prior to admission. Monitor with increased mobility  LOS (Days) 14 A FACE TO FACE EVALUATION WAS  PERFORMED  Manley Fason E 11/04/2014, 10:24 AM

## 2014-11-04 NOTE — Progress Notes (Signed)
Occupational Therapy Session Note  Patient Details  Name: Mario DrownJerome A Lambson MRN: [id number] Date of Birth: [birth year]  Today's Date: 11/04/2014 OT Individual Time: 0800-0900 and 1300- 1345 OT Individual Time Calculation (min): 60 min and 45 min   Short Term Goals: Week 2:  OT Short Term Goal 1 (Week 2): Pt will donn pullover shirt with min assist and mod demonstrational cueing for hemi techniques OT Short Term Goal 2 (Week 2): Pt will perform LB dressing with min assist sit to stand following hemi-techniques OT Short Term Goal 3 (Week 2): Pt will initiate and complete all bathing with no more than mod demonstrational cueing. OT Short Term Goal 4 (Week 2): Pt will utilize hand over hand technique for bathing with min cues in order to increase functional involvement of hemiplegic side.  OT Short Term Goal 5 (Week 2): [name] will return demonstrate safe assist with PROM exercises for the RUE and hand.  Skilled Therapeutic Interventions/Progress Updates:    Session 1: Upon entering the room, pt seated in wheelchair with father present assisting with dressing tasks. Pt with no signs, symptoms, or c/o pain during session. Pt continues to be nonverbal but attempts speaking several times during session but unable to do so. OT educated family member on importance of having pt attempt to do tasks himself in order to increase occupational performance. Pt able to donn L sock with one hand technique and required assistance from therapist for remaining LB dressing. Pt ambulated with quad cane and R brace on with Max A into bathroom ~ 10 feet. Pt having to navigate a variety of thresholds which caused to to have additional issues. OT provided manual facilitation and tactile cues for weight shifts during ambulation. Pt ambulating out of bathroom with Mod A as therapist provided verbal cues and demonstration for ambulation over thresholds. Once returning to wheelchair, pt propelled chair with hemiplegic technique  to day room with supervision. STS from wheelchair with Mod A at table. Pt weight bearing through R UE/LE while also shifting weight to the L to obtain cards for sorting on table top. Pt requiring mod A for standing balance ~ 8 minutes. Pt returning to room, QRB and R arm tray donned. Call bell and all needed items within reach. RN arrived to give medications prior to exiting.   Session 2: Upon entering the room, pt seated in wheelchair with no signs,symptoms, or c/o pain this session. Father present in room but stepping out during session. Pt propelled wheelchair 300+ feet during session with hemi technique to tub transfer room. His father reported prior to session that pt would need to walk into bathroom as wheelchair would not fit. Pt ambulating ~ 15 feet into bathroom with quad cane and Max A for weight shift and balance during ambulating. Pt having the most difficulty backing up 2 steps to tub transfer bench before sitting. Pt able to get both feet into/out of tub independently with hooking L UE around R LE for assistance. Pt practiced ambulating and transfer x 2 this session. Pt requiring rest breaks for fatigue with ambulation. Pt ambulation with mod A and use of quad cane with second attempt. Pt propelling wheelchair to ADL apartment and ambulated ~ 5 feet to sit onto plush love seat sofa. Pt required Max A to stand from low, plush surface. Pt returning to wheelchair with Mod A ambulation and use of Quad cane. Pt propelled self back to room via hemiplegic technique and supervision. QRB donned and call bell within  reach upon exiting the room.   Therapy Documentation Precautions:  Precautions Precautions: Fall Precaution Comments: global aphasia, aspiration risk, dense R hemiplegia Restrictions Weight Bearing Restrictions: No Pain: Pain Assessment Pain Assessment: Faces Faces Pain Scale: No hurt Pain Type: Chronic pain Pain Location: Knee Pain Orientation: Right Pain Onset: Other (Comment) (pt  pointed to knee. nodded yes to tingling/numb feeling) Pain Intervention(s): Medication (See eMAR)  See FIM for current functional status  Therapy/Group: Individual Therapy  Lowella Grip 11/04/2014, 11:36 AM

## 2014-11-04 NOTE — Progress Notes (Signed)
Physical Therapy Session Note  Patient Details  Name: Mario [name] MRN: [id number] Date of Birth: March 19, 1976  Today's Date: 11/04/2014 PT Individual Time: [id number] PT Individual Time Calculation (min): 45 min   Short Term Goals: Week 2:  PT Short Term Goal 1 (Week 2): Pt will perform bed mobility with HOB flat and without rails at min A level with 50% cues to attend to RUE/LE.  PT Short Term Goal 2 (Week 2): Pt will perform functional transfers R and L at mod A level with 50% cues to attend to RUE/LE PT Short Term Goal 3 (Week 2): Pt will perform dynamic standing activity x 3 mins at mod A level and single UE support PT Short Term Goal 4 (Week 2): Pt will ambulate x 25' w/ LRAD and mod A with +2A for chair follow for safety.   Skilled Therapeutic Interventions/Progress Updates:   Pt received sitting in w/c, agreeable to therapy session.  Pt self propelled x 100' x 2 reps using L  Hemi technique at S level for improved quality and to scan R environment.  Skilled session with RT to focus on functional gait with use of LBQC and R blue rocker AFO as well as NMR for trunk, RLE, weight shifting to the L to decrease pusher tendencies.  Performed 60' gait x 1 reps with LBQC at mod A level with continued facilitation at trunk for upright trunk, decreased compensatory trunk extension, and also tapping at R quad for stabilization of R knee during stance.  Tolerated well.  Ended session with dynamic task advancing RLE on ground with LLE up on mat to increase weight shift to the L during task as well as (in same position) reaching far to the L to place bean bag through basketball hoop.  Facilitation for increased weight shift to the L to elevate RLE from floor.  Tolerated well with intermittent rest breaks throughout.  Requires +2A throughout for NMR tasks for safety and facilitation.     Therapy Documentation Precautions:  Precautions Precautions: Fall Precaution Comments: global aphasia, aspiration risk,  dense R hemiplegia Restrictions Weight Bearing Restrictions: No   Vital Signs: Therapy Vitals Temp: 97.9 F (36.6 C) Temp Source: Oral Pulse Rate: 73 Resp: 18 BP: 106/66 mmHg Patient Position (if appropriate): Lying Oxygen Therapy SpO2: 96 % O2 Device: Not Delivered Pain: Pt with no c/o pain during session.    See FIM for current functional status  Therapy/Group: Co-Treatment w/ RT  Daqwan Dougal, Meribeth MattesEmily Ann 11/04/2014, 8:08 AM

## 2014-11-04 NOTE — Patient Care Conference (Signed)
Inpatient RehabilitationTeam Conference and Plan of Care Update Date: 11/04/2014   Time: 11;20 AM    Patient Name: Mario [name]      Medical Record Number: [id number]  Date of Birth: June 03, 1976 Sex: Male         Room/Bed: 4W10C/4W10C-01 Payor Info: Payor: Pharmacist, hospital / Plan: [employer] PPO / Product Type: *No Product type* /    Admitting Diagnosis: L MCA  CVA   Admit Date/Time:  10/21/2014  2:51 PM Admission Comments: No comment available   Primary Diagnosis:  Left middle cerebral artery stroke Principal Problem: Left middle cerebral artery stroke  Patient Active Problem List   Diagnosis Date Noted  . 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 Right hemiparesis 10/21/2014  . 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 Claudette Laws Social Worker Present: Dossie Der, LCSW Nurse Present: Carmie End, RN PT Present: Edman Circle, PT;Emily Marya Amsler, PT OT Present: Perrin Maltese, Domenic Schwab, OT SLP Present: Jackalyn Lombard, SLP PPS Coordinator present : Tora Duck, RN, CRRN     Current Status/Progress Goal Weekly Team Focus  Medical   Aphasia, some limited improvement, minimal RUE movement, increasing RLE movement with weight bearing  Home discharge   communication   Bowel/Bladder   Continent of bowel and bladder. LBM 11/02/14  Mod assist  Remain continent of bowel and bladder   Swallow/Nutrition/ Hydration   Dys 2, thin liquids  supervision, upgraded   trials of advanced solids, toleration of thins   ADL's   mod A dressing tasks, Min - Mod A functional transfer to toilet and TTB, trace movement shoulder flexion  min A overall; UB dressing-supervision  BADL training, R NMR, functional transfers/mobility, fmaily education   Mobility   Mod A bed mobility, min/mod A stand pivot  transfers, mod A standing balance tasks, mod/max A for gait with use of LBQC.  Continues to be limited by aphasia and apraxia during functional mobility.   S to min A (mod A ambulation goals for therapy only at this time)  R NMR, functional transfers, gait, stairs, pt/family education   Communication   max assist for functional communication, improvements noted in phonation, increase in number of phonemes able to produce on command   min-mod assist   automatic sequences, following directions, yes/no response accuracy, repetition of cv syllables   Safety/Cognition/ Behavioral Observations  mod assist for functional problem solving, improved impulsivity, continues with decreased safety awareness,  sequencing for basic familiar tasks   min assist, upgraded    continue addressing sequencing, initiation, safety awareness    Pain   No current complaints of pain  <2 on a 0-10 scale  assess pain q 4 hr and medicate as needed   Skin   No new skin issues, Rt. groin incision healed  remain free of infection and breakdown while on rehab  assess skin q shift      *See Care Plan and progress notes for long and short-term goals.  Barriers to Discharge: heavy assist,     Possible Resolutions to Barriers:  Cont rehab    Discharge Planning/Teaching Needs:  Wife and Dad here daily and attending therapies with pt. Planning to provide 24 hr care upon discharge      Team Discussion:  Making steady progress-MBS yesterday progressed to thin liquids. Moving shoulder more and leg. Can  say one work-Hurry. Walking with mod assist in PT.  Wife and father here daily  Revisions to Treatment Plan:  None   Continued Need for Acute Rehabilitation Level of Care: The patient requires daily medical management by a physician with specialized training in physical medicine and rehabilitation for the following conditions: Daily direction of a multidisciplinary physical rehabilitation program to ensure safe treatment while  eliciting the highest outcome that is of practical value to the patient.: Yes Daily medical management of patient stability for increased activity during participation in an intensive rehabilitation regime.: Yes Daily analysis of laboratory values and/or radiology reports with any subsequent need for medication adjustment of medical intervention for : Neurological problems  Ashleynicole Mcclees, Lemar LivingsRebecca G 11/04/2014, 1:51 PM

## 2014-11-05 ENCOUNTER — Inpatient Hospital Stay (HOSPITAL_COMMUNITY): Payer: BC Managed Care – PPO | Admitting: Rehabilitation

## 2014-11-05 ENCOUNTER — Inpatient Hospital Stay (HOSPITAL_COMMUNITY): Payer: BC Managed Care – PPO | Admitting: Speech Pathology

## 2014-11-05 ENCOUNTER — Inpatient Hospital Stay (HOSPITAL_COMMUNITY): Payer: BC Managed Care – PPO | Admitting: Occupational Therapy

## 2014-11-05 DIAGNOSIS — G8110 Spastic hemiplegia affecting unspecified side: Secondary | ICD-10-CM

## 2014-11-05 NOTE — Progress Notes (Signed)
Social Work Patient ID: Mario [name], male   DOB: 1976-03-26, 39 y.o.   MRN: [id number] Met with pt and wife to discuss team conference progression toward his goals and target discharge still 6/15.  Wife to get worker the name of pt's primary MD he had just Changed.  Will work on getting pt set up with PCP before discharge.  Wife is here today and participating in pt';s therapies. Both pleased with how well he is progressing and that His diet was advanced to thin liquids. Will work toward discharge, aware insurance has approved him until the 6/15.

## 2014-11-05 NOTE — Progress Notes (Signed)
He presented to [facility] 10/19/2014 with acute onset of right sided weakness, headache and inability to speak. Cranial CT scan negative. MRI of the head showed moderate volume of patchy acute infarct left MCA territory. Most confluent involvement at the corona radiata and lentiform nuclei. No mass effect. Subjective/Complaints: Wife in room, states that patient is getting frustrated with speech. Stuck on the word "hurry" Also wife notes that the patient has had some pain in the right upper extremity with range of motion Patient is unable to voice complaints due to a aphasia. WC bound   Review of Systems - unable to obtain secondary to severe aphasia  Objective: Vital Signs: Blood pressure 119/75, pulse 69, temperature 97.5 F (36.4 C), temperature source Oral, resp. rate 18, height 6' (1.829 m), weight 117 kg (257 lb 15 oz), SpO2 100 %. Dg Swallowing Func-speech Pathology  11/03/2014    Objective Swallowing Evaluation:    Patient Details  Name: HASHEEM VOLAND MRN: [id number] Date of Birth: 22-Oct-1975  Today's Date: 11/03/2014 Time: No Data Recorded-No Data Recorded No Data Recorded  Past Medical History:  Past Medical History  Diagnosis Date  . Hypertension    Past Surgical History:  Past Surgical History  Procedure Laterality Date  . No past surgeries     HPI:  No Data Recorded  No Data Recorded  Assessment / Plan / Recommendation CHL IP CLINICAL IMPRESSIONS 11/03/2014  Therapy Diagnosis Moderate pharyngeal phase dysphagia;Mild oral phase  dysphagia  Clinical Impression Pt presents with a mild-moderate oropharyngeal  dysphagia with both sensory and motor components.  Oral phase deficits  were characterized by right sided labial, lingual, and buccal weakness  which resulted in weakened manipulation of boluses, and prolonged transit  of materials to the oropharynx which lead to anterior labial loss of  liquids and right sided buccal residue left in the oral cavity post  swallow.  The  abovementioned deficits, in addition to decreased pharyngeal  sensation lead to delayed swallow initiation with response triggered at  the level of the vallecula across all consistencies.  Pt was also noted  with decreased hyolaryngeal excursion and base of tongue retraction which  lead to mild-moderate residue across all consistencies at the level of the  vallecula and pyriform sinuses.  Despite his pharyngeal based impairments,  pt was noted with relatively good airway protection and no significant  penetration  was visualized across any consistencies assessed.  However,  pt remains at risk for aspiration/penetration of pharyngeal residuals  after the swallow; therefore, recommend that pt's diet be upgraded to thin  liquids with continued dys 2 textures and full supervision for use of the  following swallowing precautions to minimize risk of aspiration: out of  bed for meals, small bites and sips, slow rate, no straws, alternate  solids and liquids, and attempt extra swallows as pt is able.  Pt's  prognosis for advancement is good with continued ST follow up to monitor  diet toleration and safe diet progression.        No flowsheet data found.   CHL IP DIET RECOMMENDATION 11/03/2014  SLP Diet Recommendations Dysphagia 2 (Fine chop);Thin     Medication Administration Crushed with puree  Compensations Check for pocketing;Check for anterior loss;Follow solids  with liquid                     SLP Swallow Goals     CHL IP REASON FOR REFERRAL 11/03/2014  Reason for Referral Objectively evaluate swallowing  function     CHL IP ORAL PHASE 11/03/2014  Lips (None)  Tongue (None)  Mucous membranes (None)  Nutritional status (None)  Other (None)  Oxygen therapy (None)  Oral Phase Impaired  Oral - Pudding Teaspoon (None)  Oral - Pudding Cup (None)  Oral - Honey Teaspoon (None)  Oral - Honey Cup (None)  Oral - Honey Syringe (None)  Oral - Nectar Teaspoon (None)  Oral - Nectar Cup (None)  Oral - Nectar Straw (None)  Oral - Nectar  Syringe (None)  Oral - Ice Chips (None)  Oral - Thin Teaspoon (None)  Oral - Thin Cup (None)  Oral - Thin Straw (None)  Oral - Thin Syringe (None)  Oral - Puree (None)  Oral - Mechanical Soft (None)  Oral - Regular (None)  Oral - Multi-consistency (None)  Oral - Pill (None)  Oral Phase - Comment (None)      CHL IP PHARYNGEAL PHASE 11/03/2014  Pharyngeal Phase Impaired  Pharyngeal - Pudding Teaspoon (None)  Penetration/Aspiration details (pudding teaspoon) (None)  Pharyngeal - Pudding Cup (None)  Penetration/Aspiration details (pudding cup) (None)  Pharyngeal - Honey Teaspoon (None)  Penetration/Aspiration details (honey teaspoon) (None)  Pharyngeal - Honey Cup (None)  Penetration/Aspiration details (honey cup) (None)  Pharyngeal - Honey Syringe (None)  Penetration/Aspiration details (honey syringe) (None)  Pharyngeal - Nectar Teaspoon (None)  Penetration/Aspiration details (nectar teaspoon) (None)  Pharyngeal - Nectar Cup (None)  Penetration/Aspiration details (nectar cup) (None)  Pharyngeal - Nectar Straw (None)  Penetration/Aspiration details (nectar straw) (None)  Pharyngeal - Nectar Syringe (None)  Penetration/Aspiration details (nectar syringe) (None)  Pharyngeal - Ice Chips (None)  Penetration/Aspiration details (ice chips) (None)  Pharyngeal - Thin Teaspoon (None)  Penetration/Aspiration details (thin teaspoon) (None)  Pharyngeal - Thin Cup (None)  Penetration/Aspiration details (thin cup) (None)  Pharyngeal - Thin Straw (None)  Penetration/Aspiration details (thin straw) (None)  Pharyngeal - Thin Syringe (None)  Penetration/Aspiration details (thin syringe') (None)  Pharyngeal - Puree (None)  Penetration/Aspiration details (puree) (None)  Pharyngeal - Mechanical Soft (None)  Penetration/Aspiration details (mechanical soft) (None)  Pharyngeal - Regular (None)  Penetration/Aspiration details (regular) (None)  Pharyngeal - Multi-consistency (None)  Penetration/Aspiration details (multi-consistency) (None)   Pharyngeal - Pill (None)  Penetration/Aspiration details (pill) (None)  Pharyngeal Comment (None)      CHL IP CERVICAL ESOPHAGEAL PHASE 11/03/2014  Cervical Esophageal Phase WFL  Pudding Teaspoon (None)  Pudding Cup (None)  Honey Teaspoon (None)  Honey Cup (None)  Honey Straw (None)  Nectar Teaspoon (None)  Nectar Cup (None)  Nectar Straw (None)  Nectar Sippy Cup (None)  Thin Teaspoon (None)  Thin Cup (None)  Thin Straw (None)  Thin Sippy Cup (None)  Cervical Esophageal Comment (None)    No flowsheet data found.         Page, Selinda Orion 11/03/2014, 9:56 AM    Results for orders placed or performed during the hospital encounter of 10/21/14 (from the past 72 hour(s))  Basic metabolic panel     Status: Abnormal   Collection Time: 11/02/14 10:40 AM  Result Value Ref Range   Sodium 140 135 - 145 mmol/L   Potassium 3.9 3.5 - 5.1 mmol/L   Chloride 101 101 - 111 mmol/L   CO2 26 22 - 32 mmol/L   Glucose, Bld 100 (H) 65 - 99 mg/dL   BUN 18 6 - 20 mg/dL   Creatinine, Ser 1.24 0.61 - 1.24 mg/dL   Calcium 9.7 8.9 - 10.3 mg/dL   GFR calc  non Af Amer >60 >60 mL/min   GFR calc Af Amer >60 >60 mL/min    Comment: (NOTE) The eGFR has been calculated using the CKD EPI equation. This calculation has not been validated in all clinical situations. eGFR's persistently <60 mL/min signify possible Chronic Kidney Disease.    Anion gap 13 5 - 15    Comment: Performed at [facility]: normal Cardio: RRR and no murmur Resp: CTA B/L and unlabored GI: BS positive and NT.ND Extremity:  Edema Mild Right Hand Skin:   Intact Neuro: Alert/Oriented, Abnormal Sensory Reduced sensation to pinch on the right side, Abnormal Motor 0/5 on muscle testing in the right upper and right lower extremity,  , Aphasic and Other difficult to evaluate visual fields secondary to severe aphasia, may have some right inattention Tone is increasing at the right elbow flexor as well as right pronator Ashworth grade 2,  finger flexor tone is normal, however pain with full finger extension 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 no acute distress   Assessment/Plan: 1. 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 Physiatrist is providing close team supervision and 24 hour management of active medical problems listed below. Physiatrist and rehab team continue to assess barriers to discharge/monitor patient progress toward functional and medical goals.   FIM: FIM - Bathing Bathing Steps Patient Completed: Chest, Left Arm, Abdomen, Front perineal area, Right upper leg, Left upper leg, Buttocks Bathing: 3: Mod-Patient completes 5-7 40f10 parts or 50-74%  FIM - Upper Body Dressing/Undressing Upper body dressing/undressing steps patient completed: Thread/unthread left sleeve of pullover shirt/dress, Put head through opening of pull over shirt/dress, Pull shirt over trunk Upper body dressing/undressing: 4: Min-Patient completed 75 plus % of tasks FIM - Lower Body Dressing/Undressing Lower body dressing/undressing steps patient completed: Don/Doff left shoe (based on donning and doffing B socks and shoes) Lower body dressing/undressing: 1: Total-Patient completed less than 25% of tasks  FIM - Toileting Toileting steps completed by patient: Adjust clothing prior to toileting Toileting Assistive Devices: Grab bar or rail for support Toileting: 2: Max-Patient completed 1 of 3 steps  FIM - TRadio producerDevices: Grab bars Toilet Transfers: 4-To toilet/BSC: Min A (steadying Pt. > 75%), 4-From toilet/BSC: Min A (steadying Pt. > 75%)  FIM - Bed/Chair Transfer Bed/Chair Transfer Assistive Devices: Arm rests, Bed rails Bed/Chair  Transfer: 0: Activity did not occur  FIM - Locomotion: Wheelchair Distance: 100 Locomotion: Wheelchair: 2: Travels 50 - 149 ft with supervision, cueing or coaxing FIM - Locomotion: Ambulation Locomotion: Ambulation Assistive Devices: CNurse, adultAmbulation/Gait Assistance: 3: Mod assist Locomotion: Ambulation: 2: Travels 50 - 149 ft with moderate assistance (Pt: 50 - 74%)  Comprehension Comprehension Mode: Auditory Comprehension: 3-Understands basic 50 - 74% of the time/requires cueing 25 - 50%  of the time  Expression Expression Mode: Nonverbal Expression: 2-Expresses basic 25 - 49% of the time/requires cueing 50 - 75% of the time. Uses single words/gestures.  Social Interaction Social Interaction: 4-Interacts appropriately 75 - 89% of the time - Needs redirection for appropriate language or to initiate interaction.  Problem Solving Problem Solving: 3-Solves basic 50 - 74% of the time/requires cueing 25 - 49% of the time  Memory Memory: 2-Recognizes or recalls  25 - 49% of the time/requires cueing 51 - 75% of the time  Medical Problem List and Plan: 1. Functional deficits secondary to left MCA infarct,aphasia and apraxia as well 2. DVT Prophylaxis/Anticoagulation: Subcutaneous Lovenox. Monitor platelet counts and any signs of bleeding 3. Pain Management: Tylenol as needed, developing some tone in the right upper extremity at this point I think physical therapy would be appropriate treatment but may need wrist hand orthosis for the right side if this increases 4. Dysphagia. Dysphagia 1 honey liquids. Monitor hydration, encourage po 5. Neuropsych: This patient is capable of making decisions on his own behalf. 6. Skin/Wound Care: Routine skin checks 7. Fluids/Electrolytes/Nutrition: Routine I/Os with follow-up chemistries 8. Hypertension. No current antihypertensive medication. Patient on hydrochlorothiazide 12.5 mg daily prior to admission. Monitor with increased mobility  LOS  (Days) 15 A FACE TO FACE EVALUATION WAS PERFORMED  KIRSTEINS,ANDREW E 11/05/2014, 8:17 AM

## 2014-11-05 NOTE — Progress Notes (Signed)
Speech Language Pathology Weekly Progress and Session Note  Patient Details  Name: Mario [name] MRN: [id number] Date of Birth: [birth year]  Beginning of progress report period:  Oct 29, 2014  End of progress report period: November 05, 2014  Today's Date: 11/05/2014 SLP Individual Time: 1405-1500 SLP Individual Time Calculation (min): 55 min  Short Term Goals: Week 2: SLP Short Term Goal 1 (Week 2): Pt will answer basic, immediate, biographical and/or environmental yes/no questions with max assist gestural cuing.   SLP Short Term Goal 1 - Progress (Week 2): Met SLP Short Term Goal 2 (Week 2): Pt will follow 1-step directions in the context of a basic, familiar task with mod assist verbal cuing.  SLP Short Term Goal 2 - Progress (Week 2): Met SLP Short Term Goal 3 (Week 2): Pt will initiate vocalizations to produce vowels and/or CV syllables with max assist visual and verbal cuing.  SLP Short Term Goal 3 - Progress (Week 2): Met SLP Short Term Goal 4 (Week 2): Pt will return demonstration of the call bell with mod assist verbal cuing.  SLP Short Term Goal 4 - Progress (Week 2): Met SLP Short Term Goal 5 (Week 2): Pt will consume his currently presecribed diet with supervision cues for use of rate and portion control, liquid wash, lingual sweep, and manual assist to monitor and correct right sided buccal residue.  SLP Short Term Goal 5 - Progress (Week 2): Progressing toward goal    New Short Term Goals: Week 3: SLP Short Term Goal 1 (Week 3): Pt will answer basic, immediate, biographical and/or environmental yes/no questions with mod assist gestural cuing.   SLP Short Term Goal 2 (Week 3): Pt will follow 2-step directions in the context of a basic, familiar task with mod assist verbal cuing.  SLP Short Term Goal 3 (Week 3): Pt will initiate vocalizations to produce vowels and/or CV syllables with mod assist visual and verbal cuing.  SLP Short Term Goal 4 (Week 3): Pt will return demonstration  of the call bell with min assist verbal cuing.  SLP Short Term Goal 5 (Week 3): Pt will consume his currently presecribed diet with supervision cues for use of rate and portion control, liquid wash, lingual sweep, and manual assist to monitor and correct right sided buccal residue.   Weekly Progress Updates:  Pt made functional gains this reporting period and has met 4 out of 5 short term goals.  Pt has demonstrated improvements in phonating on command to produce CV syllables, his yes/no response accuracy has improved, and he is able to follow 1-step commands in a functional context with min-mod assist.  Pt continues to require max assist for functional communication due to global aphasia, apraxia, and decreased awareness of verbal errors/perseveration.  Pt also presents with improved safety awareness and decreased impulsivity during functional tasks.  Pt's diet has been upgraded to thin liquids with continued dys 2 textures.  He is consuming his currently prescribed diet with min verbal cues for rate and portion control and to monitor right sided anterior labial loss of materials.  Pt would continue to benefit from skilled ST while inpatient in order to maximize functional independence and reduce burden of care prior to discharge.  Pt and family education is ongoing.  Pt's family is very supportive and receptive to education.  Anticipate that pt will need 24/7 supervision at discharge in addition to follow up speech therapy at next level of care.     Intensity: Minumum of 1-2  x/day, 30 to 90 minutes Frequency: 3 to 5 out of 7 days Duration/Length of Stay: 21-28 days  Treatment/Interventions: Cognitive remediation/compensation;Cueing hierarchy;Patient/family education;Speech/Language facilitation;Internal/external aids;Environmental controls;Functional tasks;Dysphagia/aspiration precaution training;Multimodal communication approach   Daily Session  Skilled Therapeutic Interventions: Pt was seen for  skilled ST targeting cognitive-linguistic goals.  Upon arrival, pt was seated upright in wheelchair,awake, alert, and agreeable to participate in Trainer.  Pt propelled himself in the wheelchair to the ST treatment room with supervision cues for route recall.  Pt produced repetitions of CV syllables /ha, who, and he/ with mod-max assist phonemic placement and tactile cues with significantly improved vocal intensity and consistency of productions in comparison to previous therapy sessions.   Pt was also stimulable for production of /hey/ x1 with max verbal and visual cues but was unable to reproduce syllable again on command.  Pt copied words with mod assist verbal and visual for awareness of errors and correction of perseveration.  Pt also identified an object when named from a field of three for >75% accuracy with min-mod assist verbal cues but had greater difficulty matching word to object from the same field.  Pt continues to make excellent progress towards goals.  Goals updated on this date to reflect current progress and plan of care.        FIM:  Comprehension Comprehension Mode: Auditory Comprehension: 3-Understands basic 50 - 74% of the time/requires cueing 25 - 50%  of the time Expression Expression Mode: Nonverbal Expression: 2-Expresses basic 25 - 49% of the time/requires cueing 50 - 75% of the time. Uses single words/gestures. Social Interaction Social Interaction: 4-Interacts appropriately 75 - 89% of the time - Needs redirection for appropriate language or to initiate interaction. Problem Solving Problem Solving: 3-Solves basic 50 - 74% of the time/requires cueing 25 - 49% of the time Memory Memory: 3-Recognizes or recalls 50 - 74% of the time/requires cueing 25 - 49% of the time General    Pain Pain Assessment Pain Assessment: Faces Pain Score: 0-No pain  Therapy/Group: Individual Therapy  Mario [name], Selinda Orion 11/05/2014, 4:42 PM

## 2014-11-05 NOTE — Progress Notes (Signed)
Physical Therapy Weekly Progress Note  Patient Details  Name: Mario [name] MRN: [id number] Date of Birth: May 18, 1976  Beginning of progress report period: Oct 28, 2014 End of progress report period: November 05, 2014  Today's Date: 11/05/2014 PT Individual Time: 0830-0930 PT Individual Time Calculation (min): 60 min   Patient has met 4 of 4 short term goals.  Pt making outstanding progress during this reporting period.  Note that he is now transferring at mod A level and father has been checked off to transfer him to bed or toilet in the room from w/c.  Also note that he is now able to ambulate with [facility] at mod A level.  Will continue to work towards all LTG's and begin initiating stair training during sessions regularly to prepare for DC home.  May also still need to consider installing ramp if pt not safe with stairs.    Patient continues to demonstrate the following deficits: decreased balance, aphasia, apraxia, decreased functional movement in RUE/LE and therefore will continue to benefit from skilled PT intervention to enhance overall performance with activity tolerance, balance, postural control, ability to compensate for deficits, functional use of  right upper extremity and right lower extremity, attention, awareness, coordination and knowledge of precautions.  Patient progressing toward long term goals..  Continue plan of care.  PT Short Term Goals Week 2:  PT Short Term Goal 1 (Week 2): Pt will perform bed mobility with HOB flat and without rails at min A level with 50% cues to attend to RUE/LE.  PT Short Term Goal 1 - Progress (Week 2): Met PT Short Term Goal 2 (Week 2): Pt will perform functional transfers R and L at mod A level with 50% cues to attend to RUE/LE PT Short Term Goal 2 - Progress (Week 2): Met PT Short Term Goal 3 (Week 2): Pt will perform dynamic standing activity x 3 mins at mod A level and single UE support PT Short Term Goal 3 - Progress (Week 2): Met PT Short Term  Goal 4 (Week 2): Pt will ambulate x 25' w/ LRAD and mod A with +2A for chair follow for safety.  PT Short Term Goal 4 - Progress (Week 2): Met Week 3:  PT Short Term Goal 1 (Week 3): Pt will perform bed mobility with HOB flat and without rails at S level with mod cues for technique.  PT Short Term Goal 2 (Week 3): Pt will perform functional transfers at min A level to the R or L with min cues. PT Short Term Goal 3 (Week 3): Pt will perform dynamic standing tasks at min A level  PT Short Term Goal 4 (Week 3): Pt will ambulate x 60' w/ LRAD at min A level of single therapist.  PT Short Term Goal 5 (Week 3): Pt will negotiate up/down 4 steps w/ L rail at mod A level   Skilled Therapeutic Interventions/Progress Updates:   Pt received lying in bed, agreeable to therapy session, wife Beryle Beams present to observe during session.  Skilled session w/ RT focused on bed mobility in room prior to leaving with [facility] flat and without rails to better simulate home, functional transfers, functional gait and stair negotiation, as well as NMR via focused used and WB through LUE/LE via tall kneeling and quadruped tasks.  Performed bed mobility at S level as above with min cues for technique.  Performed all stand pivot transfers during session at min/mod A level with min cues for safety and technique and  facilitation for weight shift to fully advance RLE.  Performed several quadruped reaching task with LUE to increase forward weight shift over RUE with therapist assisting to keep extended R arm and RT assisted at hips for maintain forward weight shift.  Performed several reps with intermittent seated rest breaks due to fatigue.  Also addressed dynamic standing and increased weight shift to the L with "lift offs" with RLE on floor and L knee propped on mat reaching for target x 10 reps.  Progressed to standing task kicking ball with LLE to increase WB and weight shift through RLE during task.  Performed gait x 2 reps of 20' to/from  stairs with [facility] and R blue rocker AFO at mod A level with improved postural control, however still with mild trunk compensations to advance RLE fully.  Performed 4, 6" steps with LUE on rail in step to fashion at mod/max A level with cues for sequencing and technique and slight assist for RLE placement.  Pt self propelled w/c back to room and left in w/c with wife present and all needs in reach.   Therapy Documentation Precautions:  Precautions Precautions: Fall Precaution Comments: global aphasia, aspiration risk, dense R hemiplegia Restrictions Weight Bearing Restrictions: No   Vital Signs: Therapy Vitals Temp: 97.5 F (36.4 C) Temp Source: Oral Pulse Rate: 69 Resp: 18 BP: 119/75 mmHg Patient Position (if appropriate): Lying Oxygen Therapy SpO2: 100 % O2 Device: Not Delivered Pain: Pt with no c/o pain during session.   See FIM for current functional status  Therapy/Group: Co-Treatment w/ RT  Latorsha Curling, Betha Loa 11/05/2014, 8:10 AM

## 2014-11-05 NOTE — Progress Notes (Signed)
Recreational Therapy Session Note  Patient Details  Name: Mellody DrownJerome A Rensch MRN: [id number] Date of Birth: [birth year] Today's Date: 11/05/2014  Pain: no c/o  Skilled Therapeutic Interventions/Progress Updates: Sessions focused on activity tolerance, dynamic standing balance, LUE/LLE WB-ing, tall kneeling, quadruped. Pt performed reaching activity while in quadruped with +2 assist.  Pt also performed "lift offs" with RLE on the floor with L knee propped on the mat reaching for a target x10 reps.  Transitioned to dynamic standing balance activity kicking a ball with LLE with mod assist.  Also performed floor transfer with +2 assist for safety.  Discussed potential for community reintegration/outing next week with pt & wife.  Wife present observing both sessions today.    Therapy/Group: Co-Treatment   Leilani Cespedes 11/05/2014, 4:21 PM

## 2014-11-05 NOTE — Progress Notes (Signed)
Physical Therapy Session Note  Patient Details  Name: Mario DrownJerome A Brisbane MRN: [id number] Date of Birth: 1976-01-11  Today's Date: 11/05/2014 PT Individual Time: 1500-1530 PT Individual Time Calculation (min): 30 min   Short Term Goals: Week 3:  PT Short Term Goal 1 (Week 3): Pt will perform bed mobility with HOB flat and without rails at S level with mod cues for technique.  PT Short Term Goal 2 (Week 3): Pt will perform functional transfers at min A level to the R or L with min cues. PT Short Term Goal 3 (Week 3): Pt will perform dynamic standing tasks at min A level  PT Short Term Goal 4 (Week 3): Pt will ambulate x 60' w/ LRAD at min A level of single therapist.  PT Short Term Goal 5 (Week 3): Pt will negotiate up/down 4 steps w/ L rail at mod A level   Skilled Therapeutic Interventions/Progress Updates:   Pt received sitting in w/c in room, agreeable to therapy session.  Skilled co-treat with RT to address dynamic standing with LLE on compliant surface to increase WB through RLE as well as floor recovery for NMR, problem solving and following simple and two step commands.  Performed standing with LLE on red physioball however requires +2A heavy assist to maintain standing, therefore transitioned to placing LLE on BOSU ball.  Requires facilitation at R hip for hip extension, however was able to maintain balance at mod A level with assist at R knee to maintain in extension.  Ended session with floor recovery with with +2A with verbal and demonstration cues on how to perform safely. Pt did very well, however note that when transitioning from SL to quadruped, RLE extended out from under him, questionable extensor tone?  Pt assisted back to w/c and back to room.  Left in w/c with all needs in reach.   Therapy Documentation Precautions:  Precautions Precautions: Fall Precaution Comments: global aphasia, aspiration risk, dense R hemiplegia Restrictions Weight Bearing Restrictions: No   Vital  Signs: Therapy Vitals Temp: 97.4 F (36.3 C) Temp Source: Oral Pulse Rate: 73 Resp: 18 BP: (!) 126/94 mmHg Patient Position (if appropriate): Sitting Oxygen Therapy SpO2: 98 % O2 Device: Not Delivered Pain: Pt with no c/o pain during session.   See FIM for current functional status  Therapy/Group: Co-Treatmentw/ RT  Vista Deckarcell, Maytal Mijangos Ann 11/05/2014, 4:21 PM

## 2014-11-05 NOTE — Progress Notes (Signed)
Occupational Therapy Weekly Progress Note  Patient Details  Name: Mario [name] MRN: [id number] Date of Birth: Jul 28, 1975  Beginning of progress report period: Oct 29, 2014 End of progress report period: November 05, 2014  Today's Date: 11/05/2014 OT Individual Time: 2353-6144 OT Individual Time Calculation (min): 61 min    Patient has met 2 of 5 short term goals. Pt continues to make steady progress towards OT goals this session. His father and wife continue to be present during sessions for family education and training. Pt has trace R shoulder flexion. Pt has been working very hard to ambulate short distance into bathroom with quad cane and mod A this session as wheelchair will not fit into bathroom at home. Wife assisted pt with bathing as needed during shower in shower tub combination.  Patient continues to demonstrate the following deficits: aphasia, apraxia, decreased strength and ROM in R UE/LE, decreased self care, decreased functional mobility/transfers, decreased balance, decreased coordination and therefore will continue to benefit from skilled OT intervention to enhance overall performance with BADL.  Patient progressing toward long term goals..  Continue plan of care.  OT Short Term Goals Week 2:  OT Short Term Goal 1 (Week 2): Pt will donn pullover shirt with min assist and mod demonstrational cueing for hemi techniques OT Short Term Goal 1 - Progress (Week 2): Met OT Short Term Goal 2 (Week 2): Pt will perform LB dressing with min assist sit to stand following hemi-techniques OT Short Term Goal 2 - Progress (Week 2): Progressing toward goal OT Short Term Goal 3 (Week 2): Pt will initiate and complete all bathing with no more than mod demonstrational cueing. OT Short Term Goal 3 - Progress (Week 2): Progressing toward goal OT Short Term Goal 4 (Week 2): Pt will utilize hand over hand technique for bathing with min cues in order to increase functional involvement of hemiplegic side.   OT Short Term Goal 4 - Progress (Week 2): Met OT Short Term Goal 5 (Week 2): [name] will return demonstrate safe assist with PROM exercises for the RUE and hand. OT Short Term Goal 5 - Progress (Week 2): Progressing toward goal Week 3:  OT Short Term Goal 1 (Week 3): Caregiver will demonstrate PROM for R UE in all planes in order to demonstrate safe assistance.  OT Short Term Goal 2 (Week 3): Pt will perform shower transfer onto TTB in tub/shower combination with min verbal cues and steady assist. OT Short Term Goal 3 (Week 3): Pt will demonstrate hand over hand technique with bathing with min demonstrational or instructional cues.  Skilled Therapeutic Interventions/Progress Updates:  Upon entering the room, pt seated in wheelchair awaiting therapist with wife present in the room for education. Pt propelled wheelchair to tub room ~ 200' utilizing hemiplegic technique and supervision. Pt ambulating ~ 15 feet into bathroom with use of quad cane and mod A for weight shifting, balance, and advancement of R foot. Pt performed shower transfer onto TTB and utilized L UE to assist R LE into tub. Pt engaged in bathing on TTB with wife assisting pt as needed. Mod demonstrational cues for safety in order to decrease fall risk while on TTB. Pt able to perform lateral lean to the R to wash buttocks with assistance for balance. OT recommended purchase of TTB, safety treads, and 1 non slip bath mat outside of tub for home use. Caregiver verbalized understanding. Dressing while seated on edge of TTB in order to don brace before ambulating back  to wheelchair with quad cane in same manner. Pt propelling wheelchair back to room in same manner as above and wife assisting pt with grooming as therapist exited the room.   Therapy Documentation Precautions:  Precautions Precautions: Fall Precaution Comments: global aphasia, aspiration risk, dense R hemiplegia Restrictions Weight Bearing Restrictions: No Pain: Pain  Assessment Pain Assessment: Faces Pain Score: 0-No pain  See FIM for current functional status  Therapy/Group: Individual Therapy  Phineas Semen 11/05/2014, 12:50 PM

## 2014-11-05 NOTE — Plan of Care (Signed)
Problem: RH SAFETY Goal: RH STG ADHERE TO SAFETY PRECAUTIONS W/ASSISTANCE/DEVICE STG Adhere to Safety Precautions With mod Assistance/Device.  Outcome: Not Progressing Pt is impulsive when transferring and going to bathroom

## 2014-11-06 ENCOUNTER — Inpatient Hospital Stay (HOSPITAL_COMMUNITY): Payer: BC Managed Care – PPO | Admitting: Rehabilitation

## 2014-11-06 ENCOUNTER — Inpatient Hospital Stay (HOSPITAL_COMMUNITY): Payer: BC Managed Care – PPO | Admitting: Speech Pathology

## 2014-11-06 ENCOUNTER — Inpatient Hospital Stay (HOSPITAL_COMMUNITY): Payer: BC Managed Care – PPO | Admitting: Occupational Therapy

## 2014-11-06 NOTE — Progress Notes (Signed)
Physical Therapy Session Note  Patient Details  Name: Mario [name] MRN: [id number] Date of Birth: 08/25/1975  Today's Date: 11/06/2014 PT Individual Time: 1030-1155 PT Individual Time Calculation (min): 85 min   Short Term Goals: Week 3:  PT Short Term Goal 1 (Week 3): Pt will perform bed mobility with HOB flat and without rails at S level with mod cues for technique.  PT Short Term Goal 2 (Week 3): Pt will perform functional transfers at min A level to the R or L with min cues. PT Short Term Goal 3 (Week 3): Pt will perform dynamic standing tasks at min A level  PT Short Term Goal 4 (Week 3): Pt will ambulate x 60' w/ LRAD at min A level of single therapist.  PT Short Term Goal 5 (Week 3): Pt will negotiate up/down 4 steps w/ L rail at mod A level   Skilled Therapeutic Interventions/Progress Updates:   Pt received sitting in w/c in room, agreeable to therapy session.  Wife states that pt not feeling well due to needing to use restroom, but not able to at this time.  Note that he has been receiving meds for this prior to session.  Skilled session focused on hands on family training with wife Bing Quarry for stand pivot transfers w/c<>bed surface as well as w/c<>toilet in room to sign her off for increased independence with task.  Performed several transfers during session at min/mod A level with cues to wife and pt regarding safety, hand placement, stepping sequence, and set up of w/c for best transfer success.  Pt and wife did very well, however wife worried about his impulsivity when therapist not present.  Spoke with wife regarding being firm with pt and allowing her to lead the transfer for increased safety.  Pts wife verbalized understanding and states that she will do this for increased safety.  Remainder of session focused on w/c propulsion in more community setting for improved technique and for scanning R environment, gait on outdoor surfaces to challenge balance, activity tolerance, as well as  for improved technique and also dynamic standing balance with use of soccer ball for kicking and placing LLE up to/from ball for NMR through RLE.  Performed w/c mobility at S to min a level for safety with mod cues for scanning R environment.  Performed gait x 2 reps of 60' with LBQC and R blue rocker AFO at min/mod A level with facilitation at trunk for decreased compensatory strategies as well as maintain upright postural control during gait.  Continued cues for cane placement as well as increased L step length.  Tolerated well with rest breaks in between.  Performed dynamic standing balance task with soccer ball at mod to +2A for safety with cues for slower improved grading of movement for increased NMR through RLE.  Tolerated well.  Propelled back to room and left in room in w/c with wife present.  Safety plan updated to reflect Tangi signed off to perform transfers.   Therapy Documentation Precautions:  Precautions Precautions: Fall Precaution Comments: global aphasia, aspiration risk, dense R hemiplegia Restrictions Weight Bearing Restrictions: No   Vital Signs: Therapy Vitals Temp: 97.6 F (36.4 C) Temp Source: Oral Pulse Rate: 67 Resp: 18 BP: (!) 93/59 mmHg Patient Position (if appropriate): Lying Oxygen Therapy SpO2: 96 % O2 Device: Not Delivered Pain: Pt with pain in RUE, wife states that pt got pain medication prior to session.   See FIM for current functional status  Therapy/Group: Individual Therapy  Vista Deckarcell, Tempie Gibeault Ann 11/06/2014, 8:15 AM

## 2014-11-06 NOTE — Progress Notes (Signed)
Occupational Therapy Session Note  Patient Details  Name: Mario [name] MRN: [id number] Date of Birth: 11-Feb-1976  Today's Date: 11/06/2014 OT Individual Time: 0900-1000 OT Individual Time Calculation (min): 60 min    Short Term Goals: Week 3:  OT Short Term Goal 1 (Week 3): Caregiver will demonstrate PROM for R UE in all planes in order to demonstrate safe assistance.  OT Short Term Goal 2 (Week 3): Pt will perform shower transfer onto TTB in tub/shower combination with min verbal cues and steady assist. OT Short Term Goal 3 (Week 3): Pt will demonstrate hand over hand technique with bathing with min demonstrational or instructional cues.  Skilled Therapeutic Interventions/Progress Updates:  Upon entering the room, pt supine in bed appearing to be very upset. Wife present in room and reports that pt is not having a good morning. Pt agreeable to session. Supine >sit with min A for trunk. Mod A stand pivot transfer into wheelchair. Pt declined toileting this session. Pt transferred stand pivot wheelchair <> TTB with Mod A and use of grab bar. Therapist assisted pt in donning bath mitt as well as utilizing long handled bath sponge in order to increase independence in self care. Pt seated in wheelchair for dressing with STS being min A. Pt requiring min- mod A for balance while standing to complete grooming tasks. Pt seated in wheelchair with wife present in room. Call bell and needed items within reach upon exiting the room.   Therapy Documentation Precautions:  Precautions Precautions: Fall Precaution Comments: global aphasia, aspiration risk, dense R hemiplegia Restrictions Weight Bearing Restrictions: No   Pain: Pain Assessment Pain Assessment: No/denies pain Pain Score: 0-No pain  See FIM for current functional status  Therapy/Group: Individual Therapy  Lowella Gripittman, Ilia Engelbert L 11/06/2014, 10:54 AM

## 2014-11-06 NOTE — Progress Notes (Signed)
Speech Language Pathology Daily Session Note  Patient Details  Name: Mario [name] MRN: [id number] Date of Birth: July 06, 1975  Today's Date: 11/06/2014 SLP Individual Time: 1330-1430 SLP Individual Time Calculation (min): 60 min  Short Term Goals: Week 3: SLP Short Term Goal 1 (Week 3): Pt will answer basic, immediate, biographical and/or environmental yes/no questions with mod assist gestural cuing.   SLP Short Term Goal 2 (Week 3): Pt will follow 2-step directions in the context of a basic, familiar task with mod assist verbal cuing.  SLP Short Term Goal 3 (Week 3): Pt will initiate vocalizations to produce vowels and/or CV syllables with mod assist visual and verbal cuing.  SLP Short Term Goal 4 (Week 3): Pt will return demonstration of the call bell with min assist verbal cuing.  SLP Short Term Goal 5 (Week 3): Pt will consume his currently presecribed diet with supervision cues for use of rate and portion control, liquid wash, lingual sweep, and manual assist to monitor and correct right sided buccal residue.   Skilled Therapeutic Interventions:  Pt was seen for skilled ST targeting cognitive-linguistic goals.  Upon arrival, pt was seated upright in wheelchair, awake, alert, and agreeable to participate in ST.  Pt produced voiced CV consonants /la/ and /ma/ with max assist demonstration and phonemic placement cues.  Pt required max to total assist demonstration cues for return demonstration of oral motor exercises targeting volitional movement of articulators.  Pt was stimulable for production of 1 real word utterance /ring/ on command with max faded to mod assist verbal cues.  Pt propelled himself to and from the therapy room with supervision.  Continue per current plan of care.    FIM:  Comprehension Comprehension Mode: Auditory Comprehension: 3-Understands basic 50 - 74% of the time/requires cueing 25 - 50%  of the time Expression Expression Mode: Nonverbal Expression: 2-Expresses  basic 25 - 49% of the time/requires cueing 50 - 75% of the time. Uses single words/gestures. Social Interaction Social Interaction: 4-Interacts appropriately 75 - 89% of the time - Needs redirection for appropriate language or to initiate interaction. Problem Solving Problem Solving: 3-Solves basic 50 - 74% of the time/requires cueing 25 - 49% of the time Memory Memory: 3-Recognizes or recalls 50 - 74% of the time/requires cueing 25 - 49% of the time  Pain Pain Assessment Pain Assessment: No/denies pain  Therapy/Group: Individual Therapy  Mario [name], Melanee SpryNicole L 11/06/2014, 5:14 PM

## 2014-11-06 NOTE — Progress Notes (Signed)
He presented to [facility] 10/19/2014 with acute onset of right sided weakness, headache and inability to speak. Cranial CT scan negative. MRI of the head showed moderate volume of patchy acute infarct left MCA territory. Most confluent involvement at the corona radiata and lentiform nuclei. No mass effect. Subjective/Complaints: Patient is tearful this morning. He saw a good friend of his.wife is in the room. Patient trying to move right arm but is unable to.    Review of Systems - unable to obtain secondary to severe aphasia  Objective: Vital Signs: Blood pressure 93/59, pulse 67, temperature 97.6 F (36.4 C), temperature source Oral, resp. rate 18, height 6' (1.829 m), weight 117 kg (257 lb 15 oz), SpO2 96 %. No results found. No results found for this or any previous visit (from the past 72 hour(s)).   HEENT: normal Cardio: RRR and no murmur Resp: CTA B/L and unlabored GI: BS positive and NT.ND Extremity:  Edema Mild Right Hand Skin:   Intact Neuro: Alert/Oriented, Abnormal Sensory Reduced sensation to pinch on the right side, Abnormal Motor 0/5 on muscle testing in the right upper and 2 minus hip adductor right lower extremity,  , Aphasic and Other difficult to evaluate visual fields secondary to severe aphasia, may have some right inattention Tone is increasing at the right elbow flexor as well as right pronator Ashworth grade 2, finger flexor tone is normal, however pain with full finger extension Patient appears to have apraxia,  Musc/Skel:  Other no pain with Right shoulder flexion but does have pain with external rotation mainly in the pectoralis area also some mild pain with passive finger extension on the right side. Gen. no acute distress   Assessment/Plan: 1. 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 Physiatrist is providing close team supervision and 24 hour management of active medical problems listed below. Physiatrist and rehab team continue to assess barriers to discharge/monitor patient progress toward functional and medical goals. Some increase in right lower extremity proximal muscle strength and ability to follow manual muscle testing instructions  FIM: FIM - Bathing Bathing Steps Patient Completed: Chest, Right Arm, Abdomen, Front perineal area, Buttocks, Right upper leg, Left upper leg Bathing: 3: Mod-Patient completes 5-7 [id number] 10 parts or 50-74%  FIM - Upper Body Dressing/Undressing Upper body dressing/undressing steps patient completed: Thread/unthread left sleeve of pullover shirt/dress, Put head through opening of pull over shirt/dress, Pull shirt over trunk Upper body dressing/undressing: 4: Min-Patient completed 75 plus % of tasks FIM - Lower Body Dressing/Undressing Lower body dressing/undressing steps patient completed: Thread/unthread left underwear leg, Thread/unthread left pants leg, Pull pants up/down, Don/Doff left shoe Lower body dressing/undressing: 3: Mod-Patient completed 50-74% of tasks  FIM - Toileting Toileting steps completed by patient: Adjust clothing prior to toileting Toileting Assistive Devices: Grab bar or rail for support Toileting: 2: Max-Patient completed 1 of 3 steps  FIM - Diplomatic Services operational officerToilet Transfers Toilet Transfers Assistive Devices: Grab bars Toilet Transfers: 4-To toilet/BSC: Min A (steadying Pt. > 75%), 4-From toilet/BSC: Min A (steadying Pt. > 75%)  FIM - Bed/Chair Transfer Bed/Chair Transfer Assistive Devices: Arm rests, Bed rails Bed/Chair Transfer: 5: Supine > Sit: Supervision (verbal cues/safety issues), 3: Bed > Chair or W/C: Mod A (lift or lower assist)  FIM - Locomotion: Wheelchair Distance: 150 Locomotion: Wheelchair: 5: Travels 150 ft or more: maneuvers on rugs and over door sills with supervision,  cueing or coaxing FIM -  Locomotion: Ambulation Locomotion: Ambulation Assistive Devices: Occupational hygienist Ambulation/Gait Assistance: 3: Mod assist Locomotion: Ambulation: 1: Travels less than 50 ft with moderate assistance (Pt: 50 - 74%)  Comprehension Comprehension Mode: Auditory Comprehension: 3-Understands basic 50 - 74% of the time/requires cueing 25 - 50%  of the time  Expression Expression Mode: Nonverbal Expression: 2-Expresses basic 25 - 49% of the time/requires cueing 50 - 75% of the time. Uses single words/gestures.  Social Interaction Social Interaction: 4-Interacts appropriately 75 - 89% of the time - Needs redirection for appropriate language or to initiate interaction.  Problem Solving Problem Solving: 3-Solves basic 50 - 74% of the time/requires cueing 25 - 49% of the time  Memory Memory: 3-Recognizes or recalls 50 - 74% of the time/requires cueing 25 - 49% of the time  Medical Problem List and Plan: 1. Functional deficits secondary to left MCA infarct,aphasia and apraxia as well 2. DVT Prophylaxis/Anticoagulation: Subcutaneous Lovenox. Monitor platelet counts and any signs of bleeding 3. Pain Management: Tylenol as needed, developing some tone in the right upper extremity at this point I think physical therapy would be appropriate treatment but may need wrist hand orthosis for the right side if this increases 4. Dysphagia. Dysphagia 1 thin liquids. Monitor hydration, encourage po, discussed with patient this is a positive improvement            5. Neuropsych: This patient isnot capable of making decisions on his own behalf  6. Skin/Wound Care: Routine skin checks 7. Fluids/Electrolytes/Nutrition: Routine I/Os with follow-up chemistries 8. Hypertension. No current antihypertensive medication. Patient on hydrochlorothiazide 12.5 mg daily prior to admission. Monitor with increased mobility  LOS (Days) 16 A FACE TO FACE EVALUATION WAS PERFORMED  KIRSTEINS,ANDREW E 11/06/2014, 10:10 AM

## 2014-11-07 ENCOUNTER — Inpatient Hospital Stay (HOSPITAL_COMMUNITY): Payer: BC Managed Care – PPO | Admitting: Occupational Therapy

## 2014-11-07 NOTE — Progress Notes (Signed)
Mario [name] is a 39 y.o. male 1975-12-02 [id number]  Subjective: No new complaints. Wife notes patient with uncomfortable "tingle sensation" along R side, especially arm, not improved with tramadol or tylenol. No other new problems. Slept well. Feeling OK.  Objective: Vital signs in last 24 hours: Temp:  [97.5 F (36.4 C)-97.9 F (36.6 C)] 97.5 F (36.4 C) (06/04 0500) Pulse Rate:  [62-69] 62 (06/04 0500) Resp:  [16-18] 16 (06/04 0500) BP: (105-125)/(62-90) 105/62 mmHg (06/04 0500) SpO2:  [96 %-100 %] 96 % (06/04 0500) Weight change:  Last BM Date: 11/06/14  Intake/Output from previous day: 06/03 0701 - 06/04 0700 In: 480 [P.O.:480] Out: -   Physical Exam General: No apparent distress   Aphasic but very alert and watching examiner and wife Lungs: Normal effort. Lungs clear to auscultation, no crackles or wheezes. Cardiovascular: Regular rate and rhythm, no edema Musculoskeletal:  grossly intact Neurological: No new neurological deficits: dense R HP, aphasic and apraxia  Wounds: N/A      Lab Results: BMET    Component Value Date/Time   NA 140 11/02/2014 1040   K 3.9 11/02/2014 1040   CL 101 11/02/2014 1040   CO2 26 11/02/2014 1040   GLUCOSE 100* 11/02/2014 1040   BUN 18 11/02/2014 1040   CREATININE 1.24 11/02/2014 1040   CALCIUM 9.7 11/02/2014 1040   GFRNONAA >60 11/02/2014 1040   GFRAA >60 11/02/2014 1040   CBC    Component Value Date/Time   WBC 5.2 10/22/2014 0600   RBC 4.39 10/22/2014 0600   HGB 13.5 10/22/2014 0600   HCT 40.4 10/22/2014 0600   PLT 199 10/22/2014 0600   MCV 92.0 10/22/2014 0600   MCH 30.8 10/22/2014 0600   MCHC 33.4 10/22/2014 0600   RDW 13.8 10/22/2014 0600   LYMPHSABS 1.5 10/22/2014 0600   MONOABS 0.4 10/22/2014 0600   EOSABS 0.1 10/22/2014 0600   BASOSABS 0.0 10/22/2014 0600   CBG's (last 3):  No results for input(s): GLUCAP in the last 72 hours. LFT's Lab Results  Component Value Date   ALT 30 10/22/2014   AST 32  10/22/2014   ALKPHOS 36* 10/22/2014   BILITOT 1.2 10/22/2014    Studies/Results: No results found.  Medications:  I have reviewed the patient's current medications. Scheduled Medications: . aspirin  325 mg Oral Daily  . enoxaparin (LOVENOX) injection  40 mg Subcutaneous Q24H  . pantoprazole sodium  40 mg Per Tube Daily  . 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 Functional deficits secondary to left MCA infarct, R HP, aphasia and apraxia  2. DVT Prophylaxis/Anticoagulation: Subcutaneous Lovenox. Monitor platelet counts and any signs of bleeding 3. Pain Management: Tylenol and tramadol as needed; developing some tone in the right upper extremity; ongoing physical therapy would be appropriate treatment but may need wrist hand orthosis for the right side if this increases. Also consider gabapentin for unpleasant sensations related to same in R side, but declines need for additional meds at this time 4. Dysphagia. Dysphagia 1 thin liquids. Monitor hydration, encourage po, discussed with patient and wife this is a positive improvement  5. Neuropsych: This patient isnot capable of making decisions on his own behalf  6. Skin/Wound Care: Routine skin checks 7.  Fluids/Electrolytes/Nutrition: Routine I/Os with follow-up chemistries 8. Hypertension. No current antihypertensive medication. Patient on hydrochlorothiazide 12.5 mg daily prior to admission. Monitor with increased mobility  Length of stay, days: 17   Markees Carns A. Felicity CoyerLeschber, MD 11/07/2014, 11:15 AM

## 2014-11-07 NOTE — Progress Notes (Signed)
Occupational Therapy Session Note  Patient Details  Name: Mario [name] MRN: [id number] Date of Birth: 1976-06-01  Today's Date: 11/07/2014 OT Individual Time: 1430-1515 OT Individual Time Calculation (min): 45 min    Short Term Goals: Week 3:  OT Short Term Goal 1 (Week 3): Caregiver will demonstrate PROM for R UE in all planes in order to demonstrate safe assistance.  OT Short Term Goal 2 (Week 3): Pt will perform shower transfer onto TTB in tub/shower combination with min verbal cues and steady assist. OT Short Term Goal 3 (Week 3): Pt will demonstrate hand over hand technique with bathing with min demonstrational or instructional cues.  Skilled Therapeutic Interventions/Progress Updates:  Upon entering the room, pt supine in bed with no signs or symptoms of pain. Pt performed supine >sit with min A to EOB. Min A stand pivot to wheelchair from bed. Pt propelled self ~ 150 feet to dayroom with supervision utilizing hemi technique. Caregiver present during session for education. OT educated and demonstrated self ROM for R UE digits,wrist, elbow,and shoulder in all planes. Pt returning demonstration with caregiver providing assist as needed with min verbal cues for proper technique. OT also educating caregiver on PROM for shoulder flexion/ext and shoulder abduction/adduction in order to safety stretch pt without causing harm. She returned demonstration as instructed. Pt ambulating with quad cane and mod A 80' into room with assistance for weight shifts in order to advance R LE with fatigue. Pt seated in wheelchair with call bell and all needed items within reach upon exiting the room.   Therapy Documentation Precautions:  Precautions Precautions: Fall Precaution Comments: global aphasia, aspiration risk, dense R hemiplegia Restrictions Weight Bearing Restrictions: No General:   Vital Signs: Therapy Vitals Temp: 98.3 F (36.8 C) Temp Source: Oral Pulse Rate: 73 Resp: 17 BP: 129/77  mmHg Patient Position (if appropriate): Sitting Oxygen Therapy SpO2: 98 % O2 Device: Not Delivered  See FIM for current functional status  Therapy/Group: Individual Therapy  Lowella Gripittman, Aayla Marrocco L 11/07/2014, 5:14 PM

## 2014-11-08 ENCOUNTER — Inpatient Hospital Stay (HOSPITAL_COMMUNITY): Payer: BC Managed Care – PPO | Admitting: Occupational Therapy

## 2014-11-08 NOTE — Progress Notes (Signed)
OTHMAN MASUR is a 39 y.o. male June 15, 1975 [id number]  Subjective: No new complaints. Family reports tolerable "pins and needles" along R side, especially arm and R great toe. No other new problems. Slept well. Feeling OK.  Objective: Vital signs in last 24 hours: Temp:  [97.9 F (36.6 C)-98.3 F (36.8 C)] 97.9 F (36.6 C) (06/05 0555) Pulse Rate:  [68-73] 68 (06/05 0555) Resp:  [17-18] 18 (06/05 0555) BP: (111-129)/(63-77) 111/63 mmHg (06/05 0555) SpO2:  [10 %-98 %] 10 % (06/05 0555) Weight change:  Last BM Date: 11/06/14  Intake/Output from previous day: 06/04 0701 - 06/05 0700 In: 680 [P.O.:680] Out: -   Physical Exam General: No apparent distress   Aphasic but very alert and watching examiner and family Lungs: Normal effort. Lungs clear to auscultation, no crackles or wheezes. Cardiovascular: Regular rate and rhythm, no edema Musculoskeletal:  grossly intact Neurological: No new neurological deficits: dense R HP, aphasic and apraxia  Wounds: N/A      Lab Results: BMET    Component Value Date/Time   NA 140 11/02/2014 1040   K 3.9 11/02/2014 1040   CL 101 11/02/2014 1040   CO2 26 11/02/2014 1040   GLUCOSE 100* 11/02/2014 1040   BUN 18 11/02/2014 1040   CREATININE 1.24 11/02/2014 1040   CALCIUM 9.7 11/02/2014 1040   GFRNONAA >60 11/02/2014 1040   GFRAA >60 11/02/2014 1040   CBC    Component Value Date/Time   WBC 5.2 10/22/2014 0600   RBC 4.39 10/22/2014 0600   HGB 13.5 10/22/2014 0600   HCT 40.4 10/22/2014 0600   PLT 199 10/22/2014 0600   MCV 92.0 10/22/2014 0600   MCH 30.8 10/22/2014 0600   MCHC 33.4 10/22/2014 0600   RDW 13.8 10/22/2014 0600   LYMPHSABS 1.5 10/22/2014 0600   MONOABS 0.4 10/22/2014 0600   EOSABS 0.1 10/22/2014 0600   BASOSABS 0.0 10/22/2014 0600   CBG's (last 3):  No results for input(s): GLUCAP in the last 72 hours. LFT's Lab Results  Component Value Date   ALT 30 10/22/2014   AST 32 10/22/2014   ALKPHOS 36* 10/22/2014   BILITOT 1.2 10/22/2014    Studies/Results: No results found.  Medications:  I have reviewed the patient's current medications. Scheduled Medications: . aspirin  325 mg Oral Daily  . enoxaparin (LOVENOX) injection  40 mg Subcutaneous Q24H  . pantoprazole sodium  40 mg Per Tube Daily  . 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 Functional deficits secondary to left MCA infarct, R HP, aphasia and apraxia  2. DVT Prophylaxis/Anticoagulation: Subcutaneous Lovenox. Monitor platelet counts and any signs of bleeding 3. Pain Management: Tylenol and tramadol as needed; developing some tone in the right upper extremity; ongoing physical therapy would be appropriate treatment but may need wrist hand orthosis for the right side if this increases. Also consider gabapentin for unpleasant sensations related to same in R side, but declines need for additional meds at this time 4. Dysphagia. Dysphagia 1 thin liquids. Monitor hydration, encourage po 5. Neuropsych: This patient isnot capable of making decisions on his own behalf  6. Skin/Wound Care: Routine skin checks 7. Fluids/Electrolytes/Nutrition: Routine I/Os with follow-up chemistries 8. Hypertension. No current antihypertensive medication. Patient on hydrochlorothiazide  12.5 mg daily prior to admission. Monitor with increased mobility  Length of stay, days: 18   Valerie A. Felicity CoyerLeschber, MD 11/08/2014, 8:32 AM

## 2014-11-08 NOTE — Progress Notes (Signed)
Occupational Therapy Session Note  Patient Details  Name: Mario [name] MRN: [id number] Date of Birth: 01/12/1976  Today's Date: 11/08/2014 OT Individual Time:  -     1130-1200   (30 min)      Short Term Goals: Week 1:  OT Short Term Goal 1 (Week 1): Pt will perform LB dressing with Max A and mod A standing balance during task. OT Short Term Goal 1 - Progress (Week 1): Met OT Short Term Goal 2 (Week 1): Pt will perform toilet transfer with Mod A stand pivot in order to increase I with functional transfer. OT Short Term Goal 2 - Progress (Week 1): Met OT Short Term Goal 3 (Week 1): Pt will perform Mod A shower transfer onto TTB in order to decrease assistance needed with functional transfer.  OT Short Term Goal 3 - Progress (Week 1): Met OT Short Term Goal 4 (Week 1): Pt will utilize hand over hand technique for bathing with min cues in order to increase functional involvement of hemiplegic side.  OT Short Term Goal 4 - Progress (Week 1): Not met  Skilled Therapeutic Interventions/Progress Updates:  Skilled OT intervention with treatment focus on the following:   R NMR,, pt/wife education.  Wife showed OT the wrist, elbow and shoulder exercises they had been taught.  Performed and educated pt/wife on scapula importance with humeral flexion.  Performed myotherapy and scapular mobilization.to RUE.  Pt expressed with face no pain.  Performed sho.flex/ext with elbow flexed for 30 reps with tapping and scapular abduction.  Pt showed 2/5 movement in this pattern>  Performed wrist and flinger flexion with light stroking.  Pt demonstrated index, thumb and 5th finger movement.  Addressed forearm in all above activities with trace movement noted.  Pt more animated at end of session.  Instructed wife to practice at night.   Therapy Documentation Precautions:  Precautions Precautions: Fall Precaution Comments: global aphasia, aspiration risk, dense R hemiplegia Restrictions Weight Bearing Restrictions:  No General:   Vital Signs: Therapy Vitals Temp: 97.9 F (36.6 C) Temp Source: Oral Pulse Rate: 68 Resp: 18 BP: 111/63 mmHg Patient Position (if appropriate): Lying Oxygen Therapy SpO2: (!) 10 % O2 Device: Not Delivered Pain:  none        See FIM for current functional status  Therapy/Group: Individual Therapy  Lisa Roca 11/08/2014, 8:01 AM

## 2014-11-09 ENCOUNTER — Inpatient Hospital Stay (HOSPITAL_COMMUNITY): Payer: BC Managed Care – PPO | Admitting: Rehabilitation

## 2014-11-09 ENCOUNTER — Inpatient Hospital Stay (HOSPITAL_COMMUNITY): Payer: BC Managed Care – PPO | Admitting: Occupational Therapy

## 2014-11-09 ENCOUNTER — Inpatient Hospital Stay (HOSPITAL_COMMUNITY): Payer: BC Managed Care – PPO | Admitting: Speech Pathology

## 2014-11-09 NOTE — Progress Notes (Signed)
Speech Language Pathology Daily Session Note  Patient Details  Name: Mario [name] MRN: [id number] Date of Birth: 04-May-1976  Today's Date: 11/09/2014 SLP Individual Time: 0901-1000 SLP Individual Time Calculation (min): 59 min  Short Term Goals: Week 3: SLP Short Term Goal 1 (Week 3): Pt will answer basic, immediate, biographical and/or environmental yes/no questions with mod assist gestural cuing.   SLP Short Term Goal 2 (Week 3): Pt will follow 2-step directions in the context of a basic, familiar task with mod assist verbal cuing.  SLP Short Term Goal 3 (Week 3): Pt will initiate vocalizations to produce vowels and/or CV syllables with mod assist visual and verbal cuing.  SLP Short Term Goal 4 (Week 3): Pt will return demonstration of the call bell with min assist verbal cuing.  SLP Short Term Goal 5 (Week 3): Pt will consume his currently presecribed diet with supervision cues for use of rate and portion control, liquid wash, lingual sweep, and manual assist to monitor and correct right sided buccal residue.   Skilled Therapeutic Interventions:  Pt was seen for skilled ST targeting cognitive-linguistic goals.  Upon arrival, pt was seated upright in wheelchair, awake, alert, and agreeable to participate in ST.  Pt propelled himself from his room to the treatment room with supervision cues for route recall.  Pt produced repetitions of /m, a, u, e, l, h/ either in isolation or as part of a cv syllable with mod-max assist demonstration and phonemic placement cues.  He was stimulable for production of 4 out of 5 real word utterances including ring, ran, hey, and main with mod assist verbal and visual cues.  SLP introduced a 6 concept augmentative communication board to facilitate functional communication with staff member as pt has demonstrated improved accuracy in object identification.  Pt matched picture to situation (i.e. "What picture would you point to if you needed _____") in ~75% of  opportunities with max assist multimodal cues.  Instructed wife on how to use communication board at this point to facilitate improved comprehension of functional questions to more accurately make his needs/wants known.  SLP does not anticipate that pt will self-initiate use of communication board with staff at this time; however, will continue address use of board for multimodal communication in therapy sessions.  Continue per current plan of care.    FIM:  Comprehension Comprehension Mode: Auditory Comprehension: 3-Understands basic 50 - 74% of the time/requires cueing 25 - 50%  of the time Expression Expression Mode: Nonverbal;Verbal Expression: 2-Expresses basic 25 - 49% of the time/requires cueing 50 - 75% of the time. Uses single words/gestures. Social Interaction Social Interaction: 4-Interacts appropriately 75 - 89% of the time - Needs redirection for appropriate language or to initiate interaction. Problem Solving Problem Solving: 3-Solves basic 50 - 74% of the time/requires cueing 25 - 49% of the time Memory Memory: 3-Recognizes or recalls 50 - 74% of the time/requires cueing 25 - 49% of the time  Pain Pain Assessment Pain Assessment: Faces Pain Score: 0-No pain  Therapy/Group: Individual Therapy  Gracyn Allor, Melanee SpryNicole L 11/09/2014, 12:35 PM

## 2014-11-09 NOTE — Progress Notes (Signed)
Physical Therapy Session Note  Patient Details  Name: Mario [name] MRN: [id number] Date of Birth: [birth year]  Today's Date: 11/09/2014 PT Individual Time: 1400-1526 PT Individual Time Calculation (min): 86 min   Short Term Goals: Week 2:  PT Short Term Goal 1 (Week 2): Pt will perform bed mobility with HOB flat and without rails at min A level with 50% cues to attend to RUE/LE.  PT Short Term Goal 1 - Progress (Week 2): Met PT Short Term Goal 2 (Week 2): Pt will perform functional transfers R and L at mod A level with 50% cues to attend to RUE/LE PT Short Term Goal 2 - Progress (Week 2): Met PT Short Term Goal 3 (Week 2): Pt will perform dynamic standing activity x 3 mins at mod A level and single UE support PT Short Term Goal 3 - Progress (Week 2): Met PT Short Term Goal 4 (Week 2): Pt will ambulate x 25' w/ LRAD and mod A with +2A for chair follow for safety.  PT Short Term Goal 4 - Progress (Week 2): Met  Skilled Therapeutic Interventions/Progress Updates:   Pt received sitting in w/c in room, agreeable to therapy session.  Pt propelled to/from therapy gym x 150' x 2 with L hemi technique.  Once in therapy gym, skilled session focused on dynamic sit<>stand and mini squats to focus on increased forward weight shift, increased WB through RLE, increased weight shift to the R with reaching tasks, increased glute/quad activation and increased WB through RUE planted on arm rest of chair.  Tolerated well, however needed min to mod A for adequate weight shift.  Progressed to sit<>stand with small step under LLE to further increase WB through RLE.  Tolerated well and was even able to complete without use of UE with light min A for forward trunk lean.  Addressed controlled descent with max verbal and demonstration cues for forward trunk lean and hip flex when sitting.  Discussed home entry with wife with single step to enter home and also discussed car transfer.  Wife states that pt only with single  step but with gravel to get into home and pt would either leave in low car (wife) or SUV (father).  Ambulated x 80' to ADL apt (and another 11' x 1 to ortho gym, and 140' back to regular gym) with min/mod A with LBQC and blue rocker AFO.  Continue to provide max verbal, tactile and visual cues for placement of cane, decreased R step length and increased L step length for more normal gait pattern.  Performed furniture transfer in ADL apt at min/mod A level due to low surface with facilitation for forward weight shift.  Performed bed mobility in ortho gym at S level with min cues for safety and technique.  Educated wife on having pt use LLE to assist RLE into bed.  Performed car transfer at min/mod A level with cues for hand placement and safety.  Ended session with stair negotiation to simulate home entry.  Performed at mod A level with max verbal and demonstration cues for safe entry.  Educated wife to bring picture of stairs for better simulation.  Ambulated back to therapy gym and transferred to w/c.  Propelled back to room and transferred to recliner.  Left with all needs in reach.   Therapy Documentation Precautions:  Precautions Precautions: Fall Precaution Comments: global aphasia, aspiration risk, dense R hemiplegia Restrictions Weight Bearing Restrictions: No   Pain: Pain Assessment Pain Assessment: Faces Pain Score: 0-No  pain  See FIM for current functional status  Therapy/Group: Individual Therapy  Denice Bors 11/09/2014, 12:53 PM

## 2014-11-09 NOTE — Progress Notes (Signed)
He presented to [facility] 10/19/2014 with acute onset of right sided weakness, headache and inability to speak. Cranial CT scan negative. MRI of the head showed moderate volume of patchy acute infarct left MCA territory. Most confluent involvement at the corona radiata and lentiform nuclei. No mass effect. Subjective/Complaints: Wife is with patient, states that he gets frustrated sometimes and "takes it out on her" We discussed the prognosis for recovery as well as duration of recovery time frame. Patient rep just returned from speech therapy, some frustration with this but overall wife feels like he is making some improvements with the phonation.  Patient is following commands better now. He is able to follow commands for physical examination.    Review of Systems - unable to obtain secondary to severe aphasia  Objective: Vital Signs: Blood pressure 124/65, pulse 68, temperature 97.7 F (36.5 C), temperature source Oral, resp. rate 18, height 6' (1.829 m), weight 117 kg (257 lb 15 oz), SpO2 99 %. No results found. No results found for this or any previous visit (from the past 72 hour(s)).   HEENT: normal, able to imitate opening mouth sticking out tongue as well as puffing out cheeks Cardio: RRR and no murmur Resp: CTA B/L and unlabored GI: BS positive and NT.ND Extremity:  Edema Mild Right Hand Skin:   Intact Neuro: Alert/Oriented, Abnormal Sensory Reduced sensation to pinch on the right side, Abnormal Motor 0/5 on muscle testing in the right upper and 2 minus hip adductor right lower extremity,  , Aphasic and Other difficult to evaluate visual fields secondary to severe aphasia, may have some right inattention Tone is increasing at the right elbow flexor as well as right pronator Ashworth grade 2, finger flexor tone is normal, however pain with full finger extension Patient appears to have apraxia,  Musc/Skel:  Other no pain with Right shoulder flexion but does have pain  with external rotation mainly in the pectoralis area also some mild pain with passive finger extension on the right side. Gen. no acute distress   Assessment/Plan: 1. 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 Physiatrist is providing close team supervision and 24 hour management of active medical problems listed below. Physiatrist and rehab team continue to assess barriers to discharge/monitor patient progress toward functional and medical goals.   FIM: FIM - Bathing Bathing Steps Patient Completed: Chest, Right Arm, Abdomen, Front perineal area, Buttocks, Right upper leg, Left upper leg, Left lower leg (including foot), Right lower leg (including foot) Bathing: 4: Min-Patient completes 8-9 71f 10 parts or 75+ percent  FIM - Upper Body Dressing/Undressing Upper body dressing/undressing steps patient completed: Thread/unthread left sleeve of pullover shirt/dress, Put head through opening of pull over shirt/dress, Pull shirt over trunk Upper body dressing/undressing: 4: Min-Patient completed 75 plus % of tasks FIM - Lower Body Dressing/Undressing Lower body dressing/undressing steps patient completed: Thread/unthread left underwear leg, Thread/unthread left pants leg, Pull pants up/down, Don/Doff left shoe Lower body dressing/undressing: 3: Mod-Patient completed 50-74% of tasks  FIM - Toileting Toileting steps completed by patient: Adjust clothing prior to toileting Toileting Assistive Devices: Grab bar or rail for support Toileting: 2: Max-Patient completed 1 of 3 steps  FIM - Diplomatic Services operational officer Devices: Grab bars Toilet Transfers: 4-To toilet/BSC: Min A (steadying Pt. > 75%), 4-From toilet/BSC: Min A (steadying Pt. > 75%)  FIM - Bed/Chair Transfer Bed/Chair  Transfer Assistive Devices: Arm rests, Bed  rails Bed/Chair Transfer: 5: Supine > Sit: Supervision (verbal cues/safety issues), 3: Bed > Chair or W/C: Mod A (lift or lower assist)  FIM - Locomotion: Wheelchair Distance: 150 Locomotion: Wheelchair: 5: Travels 150 ft or more: maneuvers on rugs and over door sills with supervision, cueing or coaxing FIM - Locomotion: Ambulation Locomotion: Ambulation Assistive Devices: Occupational hygienistCane - Quad Ambulation/Gait Assistance: 3: Mod assist Locomotion: Ambulation: 1: Travels less than 50 ft with moderate assistance (Pt: 50 - 74%)  Comprehension Comprehension Mode: Auditory Comprehension: 3-Understands basic 50 - 74% of the time/requires cueing 25 - 50%  of the time  Expression Expression Mode: Verbal, Nonverbal Expression: 2-Expresses basic 25 - 49% of the time/requires cueing 50 - 75% of the time. Uses single words/gestures.  Social Interaction Social Interaction: 4-Interacts appropriately 75 - 89% of the time - Needs redirection for appropriate language or to initiate interaction.  Problem Solving Problem Solving: 3-Solves basic 50 - 74% of the time/requires cueing 25 - 49% of the time  Memory Memory: 3-Recognizes or recalls 50 - 74% of the time/requires cueing 25 - 49% of the time  Medical Problem List and Plan: 1. 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 DVT Prophylaxis/Anticoagulation: Subcutaneous Lovenox. Monitor platelet counts and any signs of bleeding 3. Pain Management: Tylenol as needed, developing some tone in the right upper extremity at this point I think physical therapy would be appropriate treatment but may need wrist hand orthosis for the right side if this increases 4. Dysphagia. Dysphagia 1 thin liquids. Monitor hydration, encourage po, discussed with patient this is a positive improvement            5. Neuropsych: This patient isnot capable of making decisions on his own behalf  6. Skin/Wound Care:  Routine skin checks 7. Fluids/Electrolytes/Nutrition: Routine I/Os with follow-up chemistries 8. Hypertension. No current antihypertensive medication. Patient on hydrochlorothiazide 12.5 mg daily prior to admission. Monitor with increased mobility  LOS (Days) 19 A FACE TO FACE EVALUATION WAS PERFORMED  KIRSTEINS,ANDREW E 11/09/2014, 10:38 AM

## 2014-11-09 NOTE — Progress Notes (Signed)
Occupational Therapy Session Note  Patient Details  Name: Mario [name] MRN: [id number] Date of Birth: 22-Jul-1975  Today's Date: 11/09/2014 OT Individual Time: [id number] OT Individual Time Calculation (min): 60 min   Skilled Therapeutic Interventions/Progress Updates:    Took pt down to the therapy gym via wheelchair.  He was able to propel himself with supervision.  Utilized Bioness H200 NMES during session for the RUE function.  Utilized Exercise Mode with intensity of flexors on level 7 and extensors on level 10.  Had pt focus on helping flex and extend the  fingers when stimulation was active.  Pt was able to tolerate 15 mins of total stimulation time with progression to Grasp Mode last part of session.  Focused on functional reach with shoulder flexion and extension in Grasp Mode to reach for cup placed at knee level.  Pt with some trace right shoulder movements noted during reaching.  Perform simulated drinking with cup and then replaced it back on the table.  Repeated this functional movement for 4-5 mins to conclude session.  Pt with no adverse reactions Wife present during session and educated both on use of NMES to help stimulate RUE use.   Therapy Documentation Precautions:  Precautions Precautions: Fall Precaution Comments: global aphasia, aspiration risk, dense R hemiplegia Restrictions Weight Bearing Restrictions: No  Pain: Pain Assessment Pain Assessment: No/denies pain Pain Score: 0-No pain ADL:  See FIM for current functional status  Therapy/Group: Individual Therapy  Mar Zettler OTR/L 11/09/2014, 3:44 PM

## 2014-11-10 ENCOUNTER — Inpatient Hospital Stay (HOSPITAL_COMMUNITY): Payer: BC Managed Care – PPO | Admitting: Speech Pathology

## 2014-11-10 ENCOUNTER — Inpatient Hospital Stay (HOSPITAL_COMMUNITY): Payer: BC Managed Care – PPO | Admitting: Rehabilitation

## 2014-11-10 ENCOUNTER — Inpatient Hospital Stay (HOSPITAL_COMMUNITY): Payer: BC Managed Care – PPO | Admitting: Occupational Therapy

## 2014-11-10 NOTE — Progress Notes (Signed)
Recreational Therapy Session Note  Patient Details  Name: Mario [name] MRN: [id number] Date of Birth: 02-05-1976 Today's Date: 11/10/2014  Pain: no c/o Skilled Therapeutic Interventions/Progress Updates: Session focused on activity tolerance, dynamic standing balance/exercises, ambulation, backwards walking, & discussion about community reintegration.  Pt performed ambulation, backwards walking and dynamic balance activities with +2 assist during co-treat with PT.  Discussed community reintegration in which pt indicated he did not want to participated through facial expression and shaking his head no when questioned about this intervention.  Therapy/Group: Co-Treatment   Afsheen Antony 11/10/2014, 4:11 PM

## 2014-11-10 NOTE — Progress Notes (Signed)
[facility] with acute onset of right sided weakness, headache and inability to speak. Cranial CT scan negative. MRI of the head showed moderate volume of patchy acute infarct left MCA territory. Most confluent involvement at the corona radiata and lentiform nuclei. No mass effect. Subjective/Complaints: Father in the room with the patient, no new issues going on  Patient perseverates on the Word hurry    Review of Systems - unable to obtain secondary to severe aphasia  Objective: Vital Signs: Blood pressure 112/70, pulse 78, temperature 98 F (36.7 C), temperature source Oral, resp. rate 18, height 6' (1.829 m), weight 117 kg (257 lb 15 oz), SpO2 98 %. No results found. No results found for this or any previous visit (from the past 72 hour(s)).   HEENT: normal,  Cardio: RRR and no murmur Resp: CTA B/L and unlabored GI: BS positive and NT.ND Extremity:  Edema Mild Right Hand Skin:   Intact Neuro: Alert/Oriented, Abnormal Sensory Reduced sensation to pinch on the right side, Abnormal Motor 0/5 on muscle testing in the right upper and 2 minus hip adductor right lower extremity,  , Aphasic and Other difficult to evaluate visual fields secondary to severe aphasia, may have some right inattention Tone is increasing at the right elbow flexor as well as right pronator Ashworth grade 2, finger flexor tone is normal, No pain with full finger extension Patient appears to have apraxia,  Musc/Skel:  Other no pain with Right shoulder flexion or with external rotation Gen. no acute distress   Assessment/Plan: 1. 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 Physiatrist is providing close team supervision and 24 hour management of active medical problems listed below. Physiatrist and rehab team continue to assess  barriers to discharge/monitor patient progress toward functional and medical goals. Team conference tomorrow morning  FIM: FIM - Bathing Bathing Steps Patient Completed: Chest, Right Arm, Abdomen, Front perineal area, Buttocks, Right upper leg, Left upper leg, Left lower leg (including foot), Right lower leg (including foot) Bathing: 4: Min-Patient completes 8-9 7362f 10 parts or 75+ percent  FIM - Upper Body Dressing/Undressing Upper body dressing/undressing steps patient completed: Thread/unthread left sleeve of pullover shirt/dress, Put head through opening of pull over shirt/dress, Pull shirt over trunk Upper body dressing/undressing: 4: Min-Patient completed 75 plus % of tasks FIM - Lower Body Dressing/Undressing Lower body dressing/undressing steps patient completed: Thread/unthread left underwear leg, Thread/unthread left pants leg, Pull pants up/down, Don/Doff left shoe Lower body dressing/undressing: 3: Mod-Patient completed 50-74% of tasks  FIM - Toileting Toileting steps completed by patient: Adjust clothing prior to toileting Toileting Assistive Devices: Grab bar or rail for support Toileting: 2: Max-Patient completed 1 of 3 steps  FIM - Diplomatic Services operational officerToilet Transfers Toilet Transfers Assistive Devices: Grab bars Toilet Transfers: 4-To toilet/BSC: Min A (steadying Pt. > 75%), 4-From toilet/BSC: Min A (steadying Pt. > 75%)  FIM - BankerBed/Chair Transfer Bed/Chair Transfer Assistive Devices: Teacher, musicCane Bed/Chair Transfer: 5: Supine > Sit: Supervision (verbal cues/safety issues), 5: Sit > Supine: Supervision (verbal cues/safety issues), 4: Bed > Chair or W/C: Min A (steadying Pt. > 75%), 4: Chair or W/C > Bed: Min A (steadying Pt. > 75%)  FIM - Locomotion: Wheelchair Distance: 150 Locomotion: Wheelchair: 5: Travels 150 ft or more: maneuvers on rugs and over door sills with supervision, cueing or coaxing FIM - Locomotion: Ambulation Locomotion: Ambulation Assistive Devices:  Cane - [employer] Ambulation/Gait  Assistance: 4: Min assist, 3: Mod assist Locomotion: Ambulation: 2: Travels 50 - 149 ft with moderate assistance (Pt: 50 - 74%)  Comprehension Comprehension Mode: Auditory Comprehension: 3-Understands basic 50 - 74% of the time/requires cueing 25 - 50%  of the time  Expression Expression Mode: Nonverbal Expression: 2-Expresses basic 25 - 49% of the time/requires cueing 50 - 75% of the time. Uses single words/gestures.  Social Interaction Social Interaction: 4-Interacts appropriately 75 - 89% of the time - Needs redirection for appropriate language or to initiate interaction.  Problem Solving Problem Solving: 3-Solves basic 50 - 74% of the time/requires cueing 25 - 49% of the time  Memory Memory: 3-Recognizes or recalls 50 - 74% of the time/requires cueing 25 - 49% of the time  Medical Problem List and Plan: 1. 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 DVT Prophylaxis/Anticoagulation: Subcutaneous Lovenox. Monitor platelet counts and any signs of bleeding 3. Pain Management: Tylenol as needed, developing some tone in the right upper extremity at this point I think physical therapy would be appropriate treatment but may need wrist hand orthosis for the right side if this increases 4. Dysphagia. Dysphagia 1 thin liquids. Monitor hydration, encourage po, discussed with patient this is a positive improvement            5. Neuropsych: This patient isnot capable of making decisions on his own behalf  6. Skin/Wound Care: Routine skin checks 7. Fluids/Electrolytes/Nutrition: Routine I/Os with follow-up chemistries 8. Hypertension. No current antihypertensive medication. Patient on hydrochlorothiazide 12.5 mg daily prior to admission. Monitor with increased mobility  LOS (Days) 20 A FACE TO FACE EVALUATION WAS PERFORMED  Mario [name] E 11/10/2014, 10:19 AM

## 2014-11-10 NOTE — Progress Notes (Signed)
Speech Language Pathology Daily Session Note  Patient Details  Name: Mario [name] MRN: [id number] Date of Birth: [birth year]  Today's Date: 11/10/2014 SLP Individual Time: 0800-0900 SLP Individual Time Calculation (min): 60 min  Short Term Goals: Week 3: SLP Short Term Goal 1 (Week 3): Pt will answer basic, immediate, biographical and/or environmental yes/no questions with mod assist gestural cuing.   SLP Short Term Goal 2 (Week 3): Pt will follow 2-step directions in the context of a basic, familiar task with mod assist verbal cuing.  SLP Short Term Goal 3 (Week 3): Pt will initiate vocalizations to produce vowels and/or CV syllables with mod assist visual and verbal cuing.  SLP Short Term Goal 4 (Week 3): Pt will return demonstration of the call bell with min assist verbal cuing.  SLP Short Term Goal 5 (Week 3): Pt will consume his currently presecribed diet with supervision cues for use of rate and portion control, liquid wash, lingual sweep, and manual assist to monitor and correct right sided buccal residue.   Skilled Therapeutic Interventions:  Pt was seen for skilled ST targeting goals for communication and dysphagia.  Upon arrival, pt was seated upright in recliner, awake, alert, and agreeable to participate in ST.  SLP completed skilled observations during presentations of pt's currently prescribed diet and trials of advanced consistencies.  Pt consumed dys 2 and dys 3 textures with min assist-supervision cues for use of swallowing precautions and he exhibited complete clearance of residual solids from the oral cavity post swallow.  No overt s/s of aspiration were evident across solids or liquids.  Recommend a diet advancement to dys 3 textures with continued thin liquids and full supervision for use of swallowing precautions.  Pt was able to select drink preference from a choice of two via head nods and shakes with mod assist multimodal cues.   Pt returned demonstration of use of  communication board in a highly structured context with mod-max assist multimodal cues to identify picture from a field of 6; however, his use of the yes/no function of the board was less accurate in comparison to yesterday's session.  Pt also presented with increased perseveration of the word "hurry" during today's session which limited his accurate production of targeted words.  He was able to successfully produce the word /main/ x1, /ring/ x3, and /earring/ x3 with max assist phonemic placement cues.  Pt was returned to room, left upright in wheelchair, with family present and all needs left within reach.      FIM:  Comprehension Comprehension Mode: Auditory Comprehension: 3-Understands basic 50 - 74% of the time/requires cueing 25 - 50%  of the time Expression Expression Mode: Nonverbal;Verbal Expression: 2-Expresses basic 25 - 49% of the time/requires cueing 50 - 75% of the time. Uses single words/gestures. Social Interaction Social Interaction: 4-Interacts appropriately 75 - 89% of the time - Needs redirection for appropriate language or to initiate interaction. Problem Solving Problem Solving: 3-Solves basic 50 - 74% of the time/requires cueing 25 - 49% of the time Memory Memory: 3-Recognizes or recalls 50 - 74% of the time/requires cueing 25 - 49% of the time FIM - Eating Eating Activity: 5: Needs verbal cues/supervision  Pain Pain Assessment Pain Assessment: No/denies pain  Therapy/Group: Individual Therapy  Daley Mooradian, Melanee SpryNicole L 11/10/2014, 4:01 PM

## 2014-11-10 NOTE — Progress Notes (Signed)
Physical Therapy Session Note  Patient Details  Name: Mario [name] MRN: [id number] Date of Birth: [birth year]  Today's Date: 11/10/2014 PT Individual Time: 1400-1526 PT Individual Time Calculation (min): 86 min   Short Term Goals: Week 3:  PT Short Term Goal 1 (Week 3): Pt will perform bed mobility with HOB flat and without rails at S level with mod cues for technique.  PT Short Term Goal 2 (Week 3): Pt will perform functional transfers at min A level to the R or L with min cues. PT Short Term Goal 3 (Week 3): Pt will perform dynamic standing tasks at min A level  PT Short Term Goal 4 (Week 3): Pt will ambulate x 60' w/ LRAD at min A level of single therapist.  PT Short Term Goal 5 (Week 3): Pt will negotiate up/down 4 steps w/ L rail at mod A level   Skilled Therapeutic Interventions/Progress Updates:   Pt received sitting in recliner in room, agreeable to therapy session.  Performed stand pivot to w/c at min A level with min tactile cues for advancing and retro stepping RLE.  Pt propelled to/from therapy gym at S level using L hemi technique to improve attention to R side.  Once in therapy gym, skilled session focused on seated nustep with BLEs only for NMR through RLE with increased glute/quad activation.  Requires physical assist to maintain RLE in straight alignment to prevent hip abd.  Progressed to gait x 40' with LBQC at min A level to perform dynamic sit<>stand task with RLE planted on kay bench with therapist assisting for increased WB and increased forward weight shift while reaching for cups.  Tolerated well with cues for increased forward weight shfit.  Progressed to standing tapping to targets with use of mirror for increased visual feedback for midline orientation with tactile and multiple verbal cues needed for accuracy and mod A to complete task.  Note that pt continues to have difficulty with hip ext and knee flex on RLE, therefore remainder of session focused on NMR for these  motions with functional tasks.  Performed backwards walking with RT assisting at hips while PT assisted at R LE for improved quality of movement and to decrease hip hiking.  Performed wall kicking task again with therapist assisting as above with good initiation of movement noted.  Performed BLE>single LE bridging with RLE off of mat as well as BLE bridging with hamstring curl on physioball with HOH assist for RLE to complete task.  Continued to note some difficulties with motor planning during tasks.  PT/RT discussed outing again with pt, however he continues to decline.  Educated on benefits of returning to community and to answer any questions in controlled setting that may arise.  Ended session with gait x 85' without AD to increase gait speed and fluidity of movement with L HHA and PT assisting on the R for increased weight shift and to assist R LE for wider step.  Assisted back to room and left in w/c with all needs in reach and father in room.   Therapy Documentation Precautions:  Precautions Precautions: Fall Precaution Comments: global aphasia, aspiration risk, dense R hemiplegia Restrictions Weight Bearing Restrictions: No   Pain: pt with no c/o pain during session.   See FIM for current functional status  Therapy/Group: Individual Therapy and Co-Treatment  Vista Deckarcell, Mekhia Brogan Ann 11/10/2014, 12:38 PM

## 2014-11-10 NOTE — Progress Notes (Signed)
Occupational Therapy Session Note  Patient Details  Name: Mellody DrownJerome A Dobie MRN: [id number] Date of Birth: [birth year]  Today's Date: 11/10/2014 OT Individual Time: 1000-1102 OT Individual Time Calculation (min): 62 min    Skilled Therapeutic Interventions/Progress Updates:    Pt ambulated to the walk-in shower with min assist with therapist facilitating.  He performed bathing with min assist.  He required mod assist to sequence through the task as well as max assist to integrate the RUE into function.  Min assist for standing to wash peri area.  He transferred to the wheelchair for dressing at the sink.  He performed most tasks with supervision to min assist level.  He continues to need assistance with pulling his pants up over the right hip as well as for donning the right shoe and AFO.  He completed grooming tasks with supervision from the wheelchair level.  Pt left in recliner at end of session with chair alarm in place.  His nurse Gavin PoundDeborah was notified.  Call button within reach.    Therapy Documentation Precautions:  Precautions Precautions: Fall Precaution Comments: global aphasia, aspiration risk, dense R hemiplegia Restrictions Weight Bearing Restrictions: No  Pain: Pain Assessment Pain Assessment: No/denies pain ADL: See FIM for current functional status  Therapy/Group: Individual Therapy  Farheen Pfahler OTR/L 11/10/2014, 12:41 PM

## 2014-11-10 NOTE — Plan of Care (Signed)
Problem: RH BOWEL ELIMINATION Goal: RH STG MANAGE BOWEL W/MEDICATION W/ASSISTANCE STG Manage Bowel with Medication with mod Assistance.  Outcome: Progressing Refusing colace

## 2014-11-11 ENCOUNTER — Ambulatory Visit (HOSPITAL_COMMUNITY): Payer: BC Managed Care – PPO | Admitting: Speech Pathology

## 2014-11-11 ENCOUNTER — Inpatient Hospital Stay (HOSPITAL_COMMUNITY): Payer: BC Managed Care – PPO | Admitting: Occupational Therapy

## 2014-11-11 ENCOUNTER — Inpatient Hospital Stay (HOSPITAL_COMMUNITY): Payer: BC Managed Care – PPO | Admitting: Rehabilitation

## 2014-11-11 NOTE — Progress Notes (Signed)
Occupational Therapy Weekly Progress Note  Patient Details  Name: Mario [name] MRN: [id number] Date of Birth: August 18, 1975  Beginning of progress report period: November 05, 2014 End of progress report period: November 11, 2014  Today's Date: 11/11/2014 OT Individual Time: 6759-1638 OT Individual Time Calculation (min): 72 min    Patient has met 4 of 4 short term goals. Pt making steady progress towards goals this week. However, pt continued to have 0/5 movement in R UE at this time. Caregiver has been educated on PROM as well as self ROM to assist pt as necessary with stretching of R UE. Pt engaging in functional stand pivot transfer with min A. Pt able to ambulate with quad cane short distance into bathroom with min A as long as he is wearing R AFO. Pt continues to make progress towards OT goals and will continue to benefit from OT services.   Patient continues to demonstrate the following deficits: decreased self care, decreased balance, deceased functional transfers, functional mobility, decreased R UE/LE ROM and strength, apraxia, global aphasia, decreased attention, decreased awareness  and therefore will continue to benefit from skilled OT intervention to enhance overall performance with BADL.  Patient progressing toward long term goals..  Continue plan of care.  OT Short Term Goals Week 3:  OT Short Term Goal 1 (Week 3): Caregiver will demonstrate PROM for R UE in all planes in order to demonstrate safe assistance.  OT Short Term Goal 1 - Progress (Week 3): Met OT Short Term Goal 2 (Week 3): Pt will perform shower transfer onto TTB in tub/shower combination with min verbal cues and steady assist. OT Short Term Goal 2 - Progress (Week 3): Met OT Short Term Goal 3 (Week 3): Pt will demonstrate hand over hand technique with bathing with min demonstrational or instructional cues. OT Short Term Goal 3 - Progress (Week 3): Met Week 4:  OT Short Term Goal 1 (Week 4): STG= LTGs secondary to upcoming  discharge  Skilled Therapeutic Interventions/Progress Updates:  Upon entering the room, pt seated in wheelchair with no signs or symptoms of pain. His father is present in room but wishes to run errands while pt in therapy session. His father reports assisting pt with ADLs prior to therapist entering the room. Pt showing good safety awareness by locking wheelchair breaks before attempting to stand. STS with min A and balance while brushing teeth with min A as well. Pt propelling wheelchair to elevators with supervision. Pt demonstrating self ROM x 10 reps for R shoulder, elbow,wrist, and digits with min demonstrational and verbal cues for proper technique. Pt ambulated outside of variety of surfaces with use of quad cane ~ 90 feet with min A. Pt seated on bench resting before standing with steady assist to ambulate back in same manner with 1 LOB as pt distracted by people walking around causing him to have LOB with mod A to correct. Pt returned to wheelchair, propelled self back to unit with supervision. Pt ambulated with quad cane into bathroom for simulated shower transfer onto TTB with min A. Pt returning to wheelchair where he propelled self the rest of the way back to room. Pt remained in chair with father present in room and call bell and all needed items within reach.   Therapy Documentation Precautions:  Precautions Precautions: Fall Precaution Comments: global aphasia, aspiration risk, dense R hemiplegia Restrictions Weight Bearing Restrictions: No Pain: Pain Assessment Pain Assessment: No/denies pain  See FIM for current functional status  Therapy/Group: Individual  Therapy  Mario [name] 11/11/2014, 11:40 AM

## 2014-11-11 NOTE — Progress Notes (Signed)
Speech Language Pathology Daily Session Note  Patient Details  Name: Mario [name] MRN: [id number] Date of Birth: 1976/02/01  Today's Date: 11/11/2014 SLP Individual Time: 0800-0900 SLP Individual Time Calculation (min): 60 min  Short Term Goals: Week 3: SLP Short Term Goal 1 (Week 3): Pt will answer basic, immediate, biographical and/or environmental yes/no questions with mod assist gestural cuing.   SLP Short Term Goal 2 (Week 3): Pt will follow 2-step directions in the context of a basic, familiar task with mod assist verbal cuing.  SLP Short Term Goal 3 (Week 3): Pt will initiate vocalizations to produce vowels and/or CV syllables with mod assist visual and verbal cuing.  SLP Short Term Goal 4 (Week 3): Pt will return demonstration of the call bell with min assist verbal cuing.  SLP Short Term Goal 5 (Week 3): Pt will consume his currently presecribed diet with supervision cues for use of rate and portion control, liquid wash, lingual sweep, and manual assist to monitor and correct right sided buccal residue.   Skilled Therapeutic Interventions:  Pt was seen for skilled ST targeting goals for dysphagia and communication.  Upon arrival, pt was seated upright in recliner, awake, alert, and agreeable to participate in ST.  SLP completed skilled observations during presentations of his currently prescribed diet to assess toleration of recently upgraded textures.  Pt consumed dys 3 scrambled eggs, oatmeal, and thin liquids with supervision cues for use of swallowing precautions.  Pt exhibited complete clearance of solids from the oral cavity post swallow and demonstrated no overt s/s of aspiration with solid or liquid consistencies.  Upon completion of meal, SLP facilitated the session with structured receptive naming practice to address prefunctional gains needed for successful implementation of a communication board.  Pt matched picture to object from a field of three with 100% accuracy and  supervision cues.  Pt also matched word to object from a field of three with supervision cues.  Pt required max cues for return demonstration of oral motor exercises targeting volitional movement of articulators for speech.  Pt was returned to room, left upright in wheelchair with quick release belt donned and all needs left within reach.  Continue per current plan of care.    FIM:  Comprehension Comprehension Mode: Auditory Comprehension: 3-Understands basic 50 - 74% of the time/requires cueing 25 - 50%  of the time Expression Expression Mode: Verbal;Nonverbal Expression: 2-Expresses basic 25 - 49% of the time/requires cueing 50 - 75% of the time. Uses single words/gestures. Social Interaction Social Interaction: 4-Interacts appropriately 75 - 89% of the time - Needs redirection for appropriate language or to initiate interaction. Problem Solving Problem Solving: 3-Solves basic 50 - 74% of the time/requires cueing 25 - 49% of the time Memory Memory: 3-Recognizes or recalls 50 - 74% of the time/requires cueing 25 - 49% of the time FIM - Eating Eating Activity: 5: Needs verbal cues/supervision  Pain Pain Assessment Pain Assessment: No/denies pain  Therapy/Group: Individual Therapy  Nazyia Gaugh, Melanee SpryNicole L 11/11/2014, 11:22 AM

## 2014-11-11 NOTE — Progress Notes (Signed)
Orthopedic Tech Progress Note Patient Details:  Mellody DrownJerome A Basques 1975-07-27 [id number]  Patient ID: Mellody DrownJerome A Applegate, male   DOB: 1975-07-27, 39 y.o.   MRN: [id number] Called in advanced brace order; spoke with Ferdinand LangoKanisha  Margueritte Guthridge 11/11/2014, 12:20 PM

## 2014-11-11 NOTE — Progress Notes (Signed)
Physical Therapy Session Note  Patient Details  Name: Mario [name] MRN: [id number] Date of Birth: November 25, 1975  Today's Date: 11/11/2014 PT Individual Time: 1400-1515 PT Individual Time Calculation (min): 75 min   Short Term Goals: Week 3:  PT Short Term Goal 1 (Week 3): Pt will perform bed mobility with HOB flat and without rails at S level with mod cues for technique.  PT Short Term Goal 2 (Week 3): Pt will perform functional transfers at min A level to the R or L with min cues. PT Short Term Goal 3 (Week 3): Pt will perform dynamic standing tasks at min A level  PT Short Term Goal 4 (Week 3): Pt will ambulate x 60' w/ LRAD at min A level of single therapist.  PT Short Term Goal 5 (Week 3): Pt will negotiate up/down 4 steps w/ L rail at mod A level   Skilled Therapeutic Interventions/Progress Updates:   Pt received sitting on EOB in room, father present but did not attend session.  Father assisted pt via stand pivot to w/c and pt propelled to/from therapy gym via L hemi technique at S level.  Marked improvement in avoiding obstacles on the R.  Once in therapy gym worked in tall kneeling for NMR through RLE via ball tap to varying areas to increase weight shift and WB on RLE and increase weight shift to the L to decrease pusher tendencies.  Chris from [facility] present during session to assess gait with use of R blue rocker AFO.  Performed 90' gait with LBQC at min A level.  Note continued trunk and hip hike compensatory strategy as well as some increased tone causing R supination during swing phase of gait.  Thayer OhmChris feels that blue rocker appropriate, heel wedge and will also add toe cap for increased clearance.  Went to retreive alternate pair of shoes and educated father on plan and why shoe would not be present until tomorrow.  Father verbalized understanding.  Progressed to tall kneeling>half kneeling with LLE to continue to force use of RLE.  Performed x 10 reps total with mod to max A (+2 present  for safety).  Ended session with seated NMR for RLE with BLE w/c propulsion to engage R hamstring and quad with [facility] assist for proper movement.  Note that as pt progressed noted more quad movement than hamstring but does have trace hamstring activation.  Also performed seated R UE task with [facility] assist from therapist on pushing therapist away and pulling back as well as bringing cup to mouth for more functional tasks with RUE.  Note trace movement in R shoulder, otherwise no active movement.  Pt assisted back to room and left in w/c.  All needs in reach and father present.   Therapy Documentation Precautions:  Precautions Precautions: Fall Precaution Comments: global aphasia, aspiration risk, dense R hemiplegia Restrictions Weight Bearing Restrictions: No   Pain: Pain Assessment Pain Assessment: No/denies pain   Locomotion : Ambulation Ambulation/Gait Assistance: 4: Min assist Wheelchair Mobility Distance: 150 +   See FIM for current functional status  Therapy/Group: Individual Therapy  Vista Deckarcell, Deshay Blumenfeld Ann 11/11/2014, 12:21 PM

## 2014-11-11 NOTE — Patient Care Conference (Signed)
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 Right hemiparesis 10/21/2014  . 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 Claudette Laws Social Worker Present: Dossie Der, LCSW Nurse Present: Carmie End, RN PT Present: Edman Circle, PT;Emily Marya Amsler, PT OT Present: Callie Fielding, OT SLP Present: Jackalyn Lombard, SLP PPS Coordinator present : Tora Duck, RN, CRRN     Current Status/Progress Goal Weekly Team Focus  Medical   severe aphasia, perseverates, poor UE recovery, communication board  Home d/c with wife assist  d/c lovenox, SCD boots   Bowel/Bladder   cont of B+B  Mod I assist  assess need for assistance /cues for toileting and timed toilet   Swallow/Nutrition/ Hydration   Dys 3, thin liquids; full supervision for use of swallowing precautions   supervision   trials of advanced consistencies, diet toleration    ADL's   min assist for bathing, min to mod assist for dressing, supervision for grooming, min assist for toileting.  Brunnstrum stage II in the right arm and hand.  Only trace shoulder movements at this time.  Still with decreased ability to sequence through bathing tasks.     min A overall; UB dressing-supervision  neuromuscular re-education, RUE exercises, balance re-training, transfer training, pt/family education   Mobility   S for bed mobility, min A stand pivot transfers, min/mod A gait with LBQC, min/mod A stairs  S to min A   R NMR, functional transfers, gait, stairs, pt/family edu   Communication   max assist for functional communication, improvements noted in volitional phonation at the word level with targeted words, improved accuracy with object identification, initiated basic use of communication board  min-mod assist   repetition of targeted words, communication board, yes/no response accuracy   Safety/Cognition/ Behavioral Observations  Mod assist   min assist   continue addressing sequencing, initiation, safety awareness    Pain   Occassional pain rt axillary area treated with ultram  Pain at or below level 2 and controlled with prn medication  assess need for pain medication and review safety for affected limbs   Skin   n/a  n/a  n/a      *See Care Plan and progress notes for long and short-term goals.  Barriers to Discharge: heavy assist , severe    Possible Resolutions to Barriers:  Cont rehab, family training    Discharge Planning/Teaching Needs:  Family here daily attending and participating in therapies-begin education for home next week      Team Discussion:  Progressing toward his goals of min level-is ambulating in PT. Order AFO. Starting to use communication board. Saying some works.  Still no arm movement. Work  on family education with Dad and wife.  Revisions to Treatment Plan:  None   Continued Need for Acute Rehabilitation Level of Care: The patient requires daily medical management by a physician with specialized training in physical medicine and rehabilitation for the following conditions: Daily direction of a multidisciplinary physical rehabilitation program to ensure safe treatment while eliciting the highest outcome  that is of practical value to the patient.: Yes Daily medical management of patient stability for increased activity during participation in an intensive rehabilitation regime.: Yes Daily analysis of laboratory values and/or radiology reports with any subsequent need for medication adjustment of medical intervention for : Neurological problems;Other  Otila Starn, [name] 11/11/2014, 2:03 PM

## 2014-11-11 NOTE — Progress Notes (Signed)
Speech Language Pathology Weekly Progress and Session Note  Patient Details  Name: Mario [name] MRN: [id number] Date of Birth: 05-23-1976  Beginning of progress report period: November 05, 2014 End of progress report period: November 12, 2014  Today's Date: 11/12/2014 SLP Individual Time: 0803-0900 SLP Individual Time Calculation (min): 57 min  Short Term Goals: Week 3: SLP Short Term Goal 1 (Week 3): Pt will answer basic, immediate, biographical and/or environmental yes/no questions with mod assist gestural cuing.   SLP Short Term Goal 1 - Progress (Week 3): Met SLP Short Term Goal 2 (Week 3): Pt will follow 2-step directions in the context of a basic, familiar task with mod assist verbal cuing.  SLP Short Term Goal 2 - Progress (Week 3): Met SLP Short Term Goal 3 (Week 3): Pt will initiate vocalizations to produce vowels and/or CV syllables with mod assist visual and verbal cuing.  SLP Short Term Goal 3 - Progress (Week 3): Met SLP Short Term Goal 4 (Week 3): Pt will return demonstration of the call bell with min assist verbal cuing.  SLP Short Term Goal 4 - Progress (Week 3): Progressing toward goal SLP Short Term Goal 5 (Week 3): Pt will consume his currently presecribed diet with supervision cues for use of rate and portion control, liquid wash, lingual sweep, and manual assist to monitor and correct right sided buccal residue.  SLP Short Term Goal 5 - Progress (Week 3): Met    New Short Term Goals: Week 4: SLP Short Term Goal 1 (Week 4): Pt will answer basic, immediate, biographical and/or environmental yes/no questions with min assist gestural cuing.   SLP Short Term Goal 2 (Week 4): Pt will initiate vocalizations to produce vowels, words, and/or CV syllables with mod assist visual  cuing.  SLP Short Term Goal 3 (Week 4): Pt will return demonstration of the call bell with min assist verbal cuing.  SLP Short Term Goal 4 (Week 4): Pt will consume trials of advanced consistencies with  supervision cues for use of rate and portion control, liquid wash, lingual sweep, and manual assist to monitor and correct right sided buccal residue.   Weekly Progress Updates:  Pt made functional gains this reporting period and has met 4 out of 5 short term goals.  Pt currently requires mod-max assist verbal cues for functional multimodal communication and has demonstrated improved volitional production of targeted phonemes, syllables, and words.  Pt has been noted with perseveration of the word /hurry/ which limits his production of new phonemes; therefore, trials of targeted phonemes have been limited to those which mimic place and shape of those in the word /hurry/.  Pt is consuming dys 3 textures and thin liquids with supervision cues for use of swallowing precautions.  He has demonstrated improved impulsivity, sequencing and initiation of tasks; however, he continues to require close supervision for safety due to cognitive deficits.  Continue to anticipate that pt will need 24/7 supervision at discharge in addition to [place] follow up and assistance for medication and financial management.  Pt and family education is ongoing.     Intensity: Minumum of 1-2 x/day, 30 to 90 minutes Frequency: 3 to 5 out of 7 days Duration/Length of Stay: 21-28 days  Treatment/Interventions: Cognitive remediation/compensation;Cueing hierarchy;Patient/family education;Speech/Language facilitation;Internal/external aids;Environmental controls;Functional tasks;Dysphagia/aspiration precaution training;Multimodal communication approach   Daily Session  Skilled Therapeutic Interventions: Pt was seen for skilled ST targeting goals for dysphagia and communication.  Upon arrival, pt was seated upright in wheelchair, awake,alert, and agreeable to  participate in [place].  Pt consumed presentations of dys 3 textures and thin liquids with supervision cues for use of swallowing precautions.  No overt s/s of aspiration were evident with  solids or liquids and pt had only trace residue of solids left in the oral cavity post swallow.  Recommend that pt remain on currently prescribed diet with full supervision for use of swallowing precautions.  Pt propelled himself in the wheelchair to the ST treatment room with supervision for safety.  SLP attempted to facilitate pt's production of targeted words with which he has been successful in previous therapy sessions; however, pt was noted significant perseveration across all targeted trials.  His perseverations alternated between /hurry/, /re/, /ring/ and he became visibly frustrated with attempts to produce other words.  He was much more successful when producing CV syllables and was able to produce /ha, he, who/ both in isolation and in alternating combinations with mod-max verbal, visual, and tactile cues.  Pt was handed off in wheelchair to PT at the end of today's session.  Goals updated on this date to reflect current progress and plan of care.          FIM:  Comprehension Comprehension Mode: Auditory Comprehension: 3-Understands basic 50 - 74% of the time/requires cueing 25 - 50%  of the time Expression Expression Mode: Verbal;Nonverbal Expression: 2-Expresses basic 25 - 49% of the time/requires cueing 50 - 75% of the time. Uses single words/gestures. Social Interaction Social Interaction: 4-Interacts appropriately 75 - 89% of the time - Needs redirection for appropriate language or to initiate interaction. Problem Solving Problem Solving: 3-Solves basic 50 - 74% of the time/requires cueing 25 - 49% of the time Memory Memory: 3-Recognizes or recalls 50 - 74% of the time/requires cueing 25 - 49% of the time FIM - Eating Eating Activity: 5: Needs verbal cues/supervision General    Pain Pain Assessment Pain Assessment: No/denies pain  Therapy/Group: Individual Therapy  Roscoe Witts, Selinda Orion 11/11/2014, 12:56 PM

## 2014-11-12 ENCOUNTER — Inpatient Hospital Stay (HOSPITAL_COMMUNITY): Payer: BC Managed Care – PPO | Admitting: Occupational Therapy

## 2014-11-12 ENCOUNTER — Inpatient Hospital Stay (HOSPITAL_COMMUNITY): Payer: BC Managed Care – PPO | Admitting: Physical Therapy

## 2014-11-12 ENCOUNTER — Inpatient Hospital Stay (HOSPITAL_COMMUNITY): Payer: BC Managed Care – PPO | Admitting: Speech Pathology

## 2014-11-12 NOTE — Progress Notes (Signed)
Physical Therapy Session Note  Patient Details  Name: SERGEY CRAIGEN MRN: [id number] Date of Birth: 1975/08/02  Today's Date: 11/12/2014 PT Individual Time: 0900-1000 PT Individual Time Calculation (min): 60 min    Therapy Documentation Precautions:  Precautions Precautions: Fall Precaution Comments: global aphasia, aspiration risk, dense R hemiplegia Restrictions Weight Bearing Restrictions: No General:   Vital Signs: Therapy Vitals Temp: 97.7 F (36.5 C) Temp Source: Oral Pulse Rate: 74 Resp: 18 BP: 113/73 mmHg Patient Position (if appropriate): Lying Oxygen Therapy SpO2: 98 % O2 Device: Not Delivered Pain: Pain Assessment Pain Assessment: No/denies pain   Transfers: Sit to and from stand transfer with contact guard assist; verbal cues even weight distribution and controlled descent.  Squat pivot transfer from bed to wheelchair min assist Squat pivot transfer to and from wheelchair and mat min assist.    Ambulation: Patient ambulated 100 feet x4  with left wide based quad cane min assist with right blue rocker brace donned.  Patient ambulated with a varied step to and step through gait pattern. Patient required min assist for positioning in midline and stabilization of right lower extremity to prevent genu recurvatum .     Stair negotiation: Patient negotiated 12 steps with left handrail and min assist right blue rocker donned. Patient required min assist for positioning in midline and stabilization of right lower extremity to prevent genu recurvatum .  Patient performed stairs with a step to pattern. Patient educated on proper sequence and technique and was able to return demonstration with minimal verbal cues.   Patient transition from supine on mat to prone with min assist for right upper extremity management. Prone to quadruped for two trials total assistx2. Total assist for right upper extremity management and stabilization as well for pelvis and hip  stabilization. Patient able to tolerate position 7 minutes.  Patient performed rapid stepping with LLE on to a 6 inch step with [facility] 6 trials for 30 seconds each trial for endurance, strengthening, and proprioceptive feedback for RLE through increased weight bearing. Min assist provided.  Patient performed walking hip abduction in tall kneel with two person assistance mod assistx1 and min assistx1 up and down mat five trials in each direction in order to promote midline orientation, even weight distribution, and postural control.   Patient tolerated treatment well. Vitals monitored and remained stable throughout session responding appropriately to activity. Patient was without pain during session. Patient tolerated session with rest breaks throughout. Patient returned to room at end of session with spouse present. Call bell within reach.    See FIM for current functional status  Therapy/Group: Individual Therapy  Merri Ray 11/12/2014, 4:46 PM

## 2014-11-12 NOTE — Progress Notes (Signed)
Social Work Patient ID: Mario [name], male   DOB: [birth year], 39 y.o.   MRN: [id number] Met with pt and wife to discuss team conference progress toward his goals and discharge still 6/15.  She is very pleased with how well he is doing and learning his care. Pt's father will also be helping and is here often and will have him participate in therapies with him. Discussed beginning with home health and then transitioning to OP therapies She was agreeable with this. Have found his PCP-Dr Rochel Brome who he saw one time but will folllow up with at discharge. Work toward discharge plans.

## 2014-11-12 NOTE — Progress Notes (Signed)
Occupational Therapy Session Note  Patient Details  Name: Mario [name] MRN: [id number] Date of Birth: 1975/11/29  Today's Date: 11/12/2014 OT Individual Time: 1101-1202 and 1400-1430 OT Individual Time Calculation (min): 61 min and 30 min   Short Term Goals: Week 4:  OT Short Term Goal 1 (Week 4): STG= LTGs secondary to upcoming discharge  Skilled Therapeutic Interventions/Progress Updates:  Session 1: Pt seated in wheelchair upon entering the room. Patient's wife, his caregiver, appears to be anxious over family education and discussion of upcoming discharge. OT provided paper handout of R UE self ROM exercises as educated and demonstrated to pt and caregiver at a previous session. OT observed caregiver leading pt through exercises safely for shoulder, elbow, wrist, and digits in all planes x 10 reps each. Caregiver required verbal cues for simple, 1 step verbal commands. Caregiver reporting no other concerns at this time. Pt propelled self back to room via supervision and hemiplegic technique.   Session 2: Upon entering the room, pt with no c/o pain this session and reclined in bed. His wife remains present during session for continued family education. Pt propelled wheelchair ~ 200+ feet to tub room utilizing hemiplegic technique. OT demonstrated on caregiver how to assist pt with ambulating by providing short direct cues, tactile cues, and facilitating weight shift. She verbalized understanding. Pt standing from wheelchair with close supervision. She assisted him in short ambulation with cane into bathroom and onto TTB. Pt required min A with ambulation and having 1 LOB when turning which required mod A. Pt able to get B LEs into and out of tub in this simulated practice. OT showed caregiver safety treads as recommended for shower at home in order to decrease fall risk. She assisted pt back to wheelchair with min A ambulation and use of quad cane. Caregiver felling more confident and required min  verbal cues from therapist for proper technique.   Therapy Documentation Precautions:  Precautions Precautions: Fall Precaution Comments: global aphasia, aspiration risk, dense R hemiplegia Restrictions Weight Bearing Restrictions: No  See FIM for current functional status  Therapy/Group: Individual Therapy  Lowella Grip 11/12/2014, 12:52 PM

## 2014-11-12 NOTE — Progress Notes (Signed)
He presented to [facility] 10/19/2014 with acute onset of right sided weakness, headache and inability to speak. Cranial CT scan negative. MRI of the head showed moderate volume of patchy acute infarct left MCA territory. Most confluent involvement at the corona radiata and lentiform nuclei. No mass effect. Subjective/Complaints: Ambulating with PT, Quad cane and blue rocker AFO Remains aphasic, following simple commands more consisently   Review of Systems - unable to obtain secondary to severe aphasia  Objective: Vital Signs: Blood pressure 130/82, pulse 72, temperature 97.9 F (36.6 C), temperature source Oral, resp. rate 18, height 6' (1.829 m), weight 117 kg (257 lb 15 oz), SpO2 98 %. No results found. No results found for this or any previous visit (from the past 72 hour(s)).   HEENT: normal,  Cardio: RRR and no murmur Resp: CTA B/L and unlabored GI: BS positive and NT.ND Extremity:  Edema Mild Right Hand Skin:   Intact Neuro: Alert/Oriented, Abnormal Sensory Reduced sensation to pinch on the right side, Abnormal Motor 0/5 on muscle testing in the right upper except trace pectoralis  , Aphasic Tone is increasing at the right elbow flexor as well as right pronator Ashworth grade 2, finger flexor tone is normal, No pain with full finger extension Patient appears to have apraxia,  Musc/Skel:  Other no pain with Right shoulder flexion or with external rotation Gen. no acute distress   Assessment/Plan: 1. 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 Physiatrist is providing close team supervision and 24 hour management of active medical problems listed below. Physiatrist and rehab team continue to assess barriers to discharge/monitor patient progress toward functional and medical goals. AFO ordered  FIM: FIM -  Bathing Bathing Steps Patient Completed: Chest, Right Arm, Abdomen, Front perineal area, Buttocks, Right upper leg, Left upper leg, Left lower leg (including foot), Right lower leg (including foot) Bathing: 4: Min-Patient completes 8-9 51f 10 parts or 75+ percent  FIM - Upper Body Dressing/Undressing Upper body dressing/undressing steps patient completed: Thread/unthread left sleeve of pullover shirt/dress, Put head through opening of pull over shirt/dress, Pull shirt over trunk, Thread/unthread right sleeve of pullover shirt/dresss Upper body dressing/undressing: 5: Supervision: Safety issues/verbal cues FIM - Lower Body Dressing/Undressing Lower body dressing/undressing steps patient completed: Thread/unthread right underwear leg, Thread/unthread left underwear leg, Pull underwear up/down, Thread/unthread right pants leg, Thread/unthread left pants leg, Pull pants up/down, Don/Doff left shoe Lower body dressing/undressing: 3: Mod-Patient completed 50-74% of tasks  FIM - Toileting Toileting steps completed by patient: Adjust clothing prior to toileting Toileting Assistive Devices: Grab bar or rail for support Toileting: 2: Max-Patient completed 1 of 3 steps  FIM - Diplomatic Services operational officer Devices: Grab bars Toilet Transfers: 4-To toilet/BSC: Min A (steadying Pt. > 75%), 4-From toilet/BSC: Min A (steadying Pt. > 75%)  FIM - Banker Devices: Teacher, music: 4: Bed > Chair or W/C: Min A (steadying Pt. > 75%), 4: Chair or W/C > Bed: Min A (steadying Pt. > 75%)  FIM - Locomotion: Wheelchair Distance: 150 + Locomotion: Wheelchair: 5: Travels 150 ft or more: maneuvers on rugs and over door sills with supervision, cueing or coaxing FIM - Locomotion: Ambulation Locomotion: Ambulation Assistive Devices: Occupational hygienist Ambulation/Gait Assistance: 4: Min assist Locomotion: Ambulation: 2: Travels 50 - 149 ft with minimal assistance  (Pt.>75%)  Comprehension Comprehension Mode: Auditory Comprehension: 3-Understands  basic 50 - 74% of the time/requires cueing 25 - 50%  of the time  Expression Expression Mode: Nonverbal Expression: 2-Expresses basic 25 - 49% of the time/requires cueing 50 - 75% of the time. Uses single words/gestures.  Social Interaction Social Interaction: 4-Interacts appropriately 75 - 89% of the time - Needs redirection for appropriate language or to initiate interaction.  Problem Solving Problem Solving: 3-Solves basic 50 - 74% of the time/requires cueing 25 - 49% of the time  Memory Memory: 3-Recognizes or recalls 50 - 74% of the time/requires cueing 25 - 49% of the time  Medical Problem List and Plan: 1. 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 DVT Prophylaxis/Anticoagulation: Subcutaneous Lovenox. Monitor platelet counts and any signs of bleeding 3. Pain Management: Tylenol as needed, developing some tone in the right upper extremity at this point I think physical therapy would be appropriate treatment but may need wrist hand orthosis for the right side if this increases 4. Dysphagia. Dysphagia 1 thin liquids. Monitor hydration, encourage po, discussed with patient this is a positive improvement            5. Neuropsych: This patient isnot capable of making decisions on his own behalf  6. Skin/Wound Care: Routine skin checks 7. Fluids/Electrolytes/Nutrition: Routine I/Os with follow-up chemistries 8. Hypertension. No current antihypertensive medication. Patient on hydrochlorothiazide 12.5 mg daily prior to admission. Monitor with increased mobility  LOS (Days) 22 A FACE TO FACE EVALUATION WAS PERFORMED  Mario [name] 11/12/2014, 9:29 AM

## 2014-11-13 ENCOUNTER — Inpatient Hospital Stay (HOSPITAL_COMMUNITY): Payer: BC Managed Care – PPO | Admitting: Rehabilitation

## 2014-11-13 ENCOUNTER — Inpatient Hospital Stay (HOSPITAL_COMMUNITY): Payer: BC Managed Care – PPO | Admitting: Occupational Therapy

## 2014-11-13 ENCOUNTER — Inpatient Hospital Stay (HOSPITAL_COMMUNITY): Payer: BC Managed Care – PPO | Admitting: Speech Pathology

## 2014-11-13 NOTE — Progress Notes (Signed)
Physical Therapy Session Note  Patient Details  Name: Mario [name] MRN: [id number] Date of Birth: [birth year]  Today's Date: 11/13/2014 PT Individual Time: 1545-1615 PT Individual Time Calculation (min): 30 min   Short Term Goals: Week 4:  PT Short Term Goal 1 (Week 4): = LTG's due to ELOS  Skilled Therapeutic Interventions/Progress Updates:   Pt received sitting in w/c in room, agreeable to therapy session.  Wife continues to state that pt being impulsive in room and refuses to allow wife to assist him.  Provided max education to wife and pt regarding the need to prevent falls and call for assist with getting up.  Educated that session would focus on floor recovery to determine if he could perform due to likelihood of pt falling at home is VERY high.  Requires +2A for floor recovery due to tone in RLE into extension when transitioning from SL to quadruped.  Once back onto mat, again discussed that wife would not be able to assist with this and did not recommend she or his father attempt and would recommend they call 911 to get off of floor for safety.  Wife verbalized understanding.  Ended session with wife ambulating with pt x 53' with LBQC and R AFO.  Provided education on handling technique posteriorly vs on the R side.  She states she feels better on the side.  Also educated on cues to provide pt and assisting with L lateral weight shift if needed.  Returned demonstration well, however feel that she needs continued practice.  Pt propelled back to room and left in w/c.   Therapy Documentation Precautions:  Precautions Precautions: Fall Precaution Comments: global aphasia, aspiration risk, dense R hemiplegia Restrictions Weight Bearing Restrictions: No   Vital Signs: Therapy Vitals Temp: 97.8 F (36.6 C) Temp Source: Oral Pulse Rate: 66 Resp: 18 BP: 109/68 mmHg Patient Position (if appropriate): Lying Oxygen Therapy SpO2: 99 % O2 Device: Not Delivered Pain: Pt with no c/o pain  during session.    Locomotion : Ambulation Ambulation/Gait Assistance: 4: Min assist   See FIM for current functional status  Therapy/Group: Individual Therapy  Vista Deck 11/13/2014, 5:46 PM

## 2014-11-13 NOTE — Progress Notes (Signed)
Speech Language Pathology Daily Session Note  Patient Details  Name: Mario [name] MRN: [id number] Date of Birth: 27-Dec-1975  Today's Date: 11/13/2014 SLP Individual Time: 1400-1500 SLP Individual Time Calculation (min): 60 min  Short Term Goals: Week 4: SLP Short Term Goal 1 (Week 4): Pt will answer basic, immediate, biographical and/or environmental yes/no questions with min assist gestural cuing.   SLP Short Term Goal 2 (Week 4): Pt will initiate vocalizations to produce vowels, words, and/or CV syllables with mod assist visual  cuing.  SLP Short Term Goal 3 (Week 4): Pt will return demonstration of the call bell with min assist verbal cuing.  SLP Short Term Goal 4 (Week 4): Pt will consume trials of advanced consistencies with supervision cues for use of rate and portion control, liquid wash, lingual sweep, and manual assist to monitor and correct right sided buccal residue.   Skilled Therapeutic Interventions: Skilled treatment session focused on cognitive-linguistic goals. Upon arrival, patient was supine in bed and transferred to the wheelchair with supervision and was able to propel himself to the dayroom with Mod I.  SLP facilitated session by providing Max A multimodal cues for reading comprehension at the word level from a field of three with 60% accuracy and phrase level from a field of two with 65% accuracy. Patient was also able to identify functional, biographical information from a field of 3 with 100% accuracy but required total A for orientation to place (thought he was at [facility] in [place]).  Patient was able to write his name with 100% accuracy and Min A verbal cues but required total A to write a basic CVC word. Patient propelled himself back to his room with his wife. Continue with current plan of care.    FIM:  Comprehension Comprehension Mode: Auditory Expression Expression Mode: Verbal Expression: 2-Expresses basic 25 - 49% of the time/requires cueing  50 - 75% of the time. Uses single words/gestures. Social Interaction Social Interaction: 4-Interacts appropriately 75 - 89% of the time - Needs redirection for appropriate language or to initiate interaction. Problem Solving Problem Solving: 3-Solves basic 50 - 74% of the time/requires cueing 25 - 49% of the time Memory Memory: 3-Recognizes or recalls 50 - 74% of the time/requires cueing 25 - 49% of the time  Pain No/Denies Pain   Therapy/Group: Individual Therapy  Darious Rehman 11/13/2014, 3:42 PM

## 2014-11-13 NOTE — Plan of Care (Signed)
Problem: RH Bed to Chair Transfers Goal: LTG Patient will perform bed/chair transfers w/assist (PT) LTG: Patient will perform bed/chair transfers with assistance, with/without cues (PT).  Downgraded due to impulsivity  Problem: RH Furniture Transfers Goal: LTG Patient will perform furniture transfers w/assist (OT/PT LTG: Patient will perform furniture transfers with assistance (OT/PT).  Downgraded due to impulsivity  Problem: RH Ambulation Goal: LTG Patient will ambulate in controlled environment (PT) LTG: Patient will ambulate in a controlled environment, # of feet with assistance (PT).  Upgraded due to progress  Problem: RH Wheelchair Mobility Goal: LTG Patient will propel w/c in home environment (PT) LTG: Patient will propel wheelchair in home environment, # of feet with assistance (PT).  Downgraded due to R inattention  Problem: RH Stairs Goal: LTG Patient will ambulate up and down stairs w/assist (PT) LTG: Patient will ambulate up and down # of stairs with assistance (PT)  Upgraded due to progress

## 2014-11-13 NOTE — Progress Notes (Signed)
Occupational Therapy Session Note  Patient Details  Name: Mario [name] MRN: [id number] Date of Birth: [birth year]  Today's Date: 11/13/2014 OT Individual Time: 0800-0900 OT Individual Time Calculation (min): 60 min    Short Term Goals: Week 4:  OT Short Term Goal 1 (Week 4): STG= LTGs secondary to upcoming discharge  Skilled Therapeutic Interventions/Progress Updates:  Upon entering the room, pt seated in wheelchair with wife present. Wife reports that pt has been attempting to transfer self in room this morning and pushing her out of the way when she attempts to assist. Therapist discussed strategies for working together as at team and how to provide verbal cues for pt in order to make transfers safe. Caregiver verbalized understanding and pt nodded head "yes". Pt propelled self into bathroom via wheelchair and supervision. Pt preforming stand pivot transfer into walk in shower with use of grab bar to sit onto TTB. Pt utilized long handled bath sponge in order to wash B feet. Pt returning to wheelchair with min A transfer after shower completed. Wife assisted pt at sink side with dressing and grooming tasks with min verbal cues from therapist for technique. Caregiver most often attempting to assist pt with more of task than needed. Pt dressed and seated in wheelchair with call bell and all needed items within reach upon exiting the room.   Therapy Documentation Precautions:  Precautions Precautions: Fall Precaution Comments: global aphasia, aspiration risk, dense R hemiplegia Restrictions Weight Bearing Restrictions: No   See FIM for current functional status  Therapy/Group: Individual Therapy  Lowella Grip 11/13/2014, 12:18 PM

## 2014-11-13 NOTE — Progress Notes (Signed)
Physical Therapy Weekly Progress Note  Patient Details  Name: Mario [name] MRN: [id number] Date of Birth: [birth year]  Beginning of progress report period: November 05, 2014 End of progress report period: November 13, 2014  Today's Date: 11/13/2014 PT Individual Time: 1000-1045 PT Individual Time Calculation (min): 45 min   Patient has met 5 of 5 short term goals.  Pt continues to make excellent progress with all aspects of mobility.  Downgraded transfer goals to min A due to impulsivity however upgraded and added home ambulation goals due to progress.  Will continue to work with wife and father on hands on training to prepare for D/C next week.   Patient continues to demonstrate the following deficits: decreased balance, decreased safety, decreased awareness, apraxia, decreased strength in RUE/LE and therefore will continue to benefit from skilled PT intervention to enhance overall performance with activity tolerance, balance, postural control, ability to compensate for deficits, functional use of  right upper extremity and right lower extremity, attention, awareness, coordination and knowledge of precautions.  Patient progressing toward long term goals.Marland Kitchen  Upgraded ambulation goals  PT Short Term Goals Week 3:  PT Short Term Goal 1 (Week 3): Pt will perform bed mobility with HOB flat and without rails at S level with mod cues for technique.  PT Short Term Goal 1 - Progress (Week 3): Met PT Short Term Goal 2 (Week 3): Pt will perform functional transfers at min A level to the R or L with min cues. PT Short Term Goal 2 - Progress (Week 3): Met PT Short Term Goal 3 (Week 3): Pt will perform dynamic standing tasks at min A level  PT Short Term Goal 3 - Progress (Week 3): Met PT Short Term Goal 4 (Week 3): Pt will ambulate x 60' w/ LRAD at min A level of single therapist.  PT Short Term Goal 4 - Progress (Week 3): Met PT Short Term Goal 5 (Week 3): Pt will negotiate up/down 4 steps w/ L rail at mod A  level  PT Short Term Goal 5 - Progress (Week 3): Met Week 4:  PT Short Term Goal 1 (Week 4): = LTG's due to ELOS  Skilled Therapeutic Interventions/Progress Updates:   Pt received sitting in w/c in room, agreeable to therapy session.  Educated wife on how to don R AFO and explained reason for toe cap to prevent falls at home.  Wife verbalized understanding.  Skilled session focused on functional gait with [facility] and R AFO to/from therapy gym x 120' at min A level with facilitation for L weight shift for improved clearance of RLE, cues for wider step length, esp when fatigued and upright posture throughout.  Note that pts gait speed is improving, however when fatigued this can cause trunk to lead body requiring assist to prevent.  Once in therapy gym, focused on NMR for RLE for increased hamstring, adductor and glute activation with supine exercise as follows; RLE only bridging x 10 reps x 2 sets with therapist holding LLE to prevent overuse, BLE hip/knee flex on physioball x 10 reps, RLE only hip/knee flex x 10 reps on ball (with assist for add), and supine controlled BLE add/abd with cues for using LLE to guide RLE for controlled movement.  Ended seated on EOM elevated with RLE on ball performing knee flex and extension with HOH assist for full movement.  Continue to note trace to 2-/5, however is difficult to carry over to function.  Ambulated back to room and left in  w/c with all needs in reach.  Wife concerned about pts impulsivity and wanting to "have control" when up moving with her.  Discussed with both of them that she is to lead all mobility and the concern for him falling if he does not listen to her and wait on her to move.  Both verbalized understanding.    Therapy Documentation Precautions:  Precautions Precautions: Fall Precaution Comments: global aphasia, aspiration risk, dense R hemiplegia Restrictions Weight Bearing Restrictions: No   Vital Signs: Therapy Vitals Temp: 98.3 F (36.8  C) Temp Source: Oral Pulse Rate: (!) 109 Resp: 18 BP: 106/69 mmHg Patient Position (if appropriate): Lying Oxygen Therapy SpO2: 96 % O2 Device: Not Delivered Pain: Pt with pain in R UE, therefore did not perform many UE WB to reduce pain.   See FIM for current functional status  Therapy/Group: Individual Therapy  Denice Bors 11/13/2014, 7:59 AM

## 2014-11-13 NOTE — Progress Notes (Signed)
Subjective/Complaints: Wife states that Delphine was a little impulsive and impatient this morning. Trying to get up on his own. Seems to have settle down now.   Review of Systems - unable to obtain secondary to severe aphasia  Objective: Vital Signs: Blood pressure 106/69, pulse 109, temperature 98.3 F (36.8 C), temperature source Oral, resp. rate 18, height 6' (1.829 m), weight 117 kg (257 lb 15 oz), SpO2 96 %. No results found. No results found for this or any previous visit (from the past 72 hour(s)).   HEENT: normal,  Cardio: RRR and no murmur Resp: CTA B/L and unlabored GI: BS positive and NT.ND Extremity:  Edema Mild Right Hand Skin:   Intact Neuro: Alert/Oriented, Abnormal Sensory Reduced sensation to pinch on the right side upper and lower, Abnormal Motor 0/5 on muscle testing in the right upper except trace pectoralis  , Aphasic Tone is increasing at the right elbow flexor as well as right pronator Ashworth grade 2, finger flexor tone is normal, No pain with full finger extension/ no obvious tone in RLE. Patient appears to have apraxia,  Musc/Skel:  Other no pain with Right shoulder flexion or with external rotation Gen. no acute distress   Assessment/Plan: 1. 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 Physiatrist is providing close team supervision and 24 hour management of active medical problems listed below. Physiatrist and rehab team continue to assess barriers to discharge/monitor patient progress toward functional and medical goals.   Discussed safety/impulsivity with patient/wife today  FIM: FIM - Bathing Bathing Steps Patient Completed: Chest, Right Arm, Abdomen, Front perineal area, Buttocks, Right upper leg, Left upper leg, Left lower leg (including foot), Right lower leg (including foot) Bathing: 4:  Min-Patient completes 8-9 82f 10 parts or 75+ percent  FIM - Upper Body Dressing/Undressing Upper body dressing/undressing steps patient completed: Thread/unthread left sleeve of pullover shirt/dress, Put head through opening of pull over shirt/dress, Pull shirt over trunk, Thread/unthread right sleeve of pullover shirt/dresss Upper body dressing/undressing: 5: Supervision: Safety issues/verbal cues FIM - Lower Body Dressing/Undressing Lower body dressing/undressing steps patient completed: Thread/unthread right underwear leg, Thread/unthread left underwear leg, Pull underwear up/down, Thread/unthread right pants leg, Thread/unthread left pants leg, Pull pants up/down, Don/Doff left shoe Lower body dressing/undressing: 3: Mod-Patient completed 50-74% of tasks  FIM - Toileting Toileting steps completed by patient: Adjust clothing prior to toileting Toileting Assistive Devices: Grab bar or rail for support Toileting: 2: Max-Patient completed 1 of 3 steps  FIM - Diplomatic Services operational officer Devices: Grab bars Toilet Transfers: 4-To toilet/BSC: Min A (steadying Pt. > 75%), 4-From toilet/BSC: Min A (steadying Pt. > 75%)  FIM - Banker Devices: Teacher, music: 4: Bed > Chair or W/C: Min A (steadying Pt. > 75%), 4: Chair or W/C > Bed: Min A (steadying Pt. > 75%)  FIM - Locomotion: Wheelchair Distance: 150 Locomotion: Wheelchair: 5: Travels 150 ft or more: maneuvers on rugs and over door sills with supervision, cueing or coaxing FIM - Locomotion: Ambulation Locomotion: Ambulation Assistive Devices: Occupational hygienist Ambulation/Gait Assistance: 4: Min assist Locomotion: Ambulation: 2: Travels 50 - 149 ft with minimal assistance (Pt.>75%)  Comprehension Comprehension Mode: Auditory Comprehension: 3-Understands basic 50 - 74% of the time/requires cueing 25 - 50%  of the time  Expression Expression Mode: Verbal Expression: 2-Expresses  basic 25 - 49% of  the time/requires cueing 50 - 75% of the time. Uses single words/gestures.  Social Interaction Social Interaction: 4-Interacts appropriately 75 - 89% of the time - Needs redirection for appropriate language or to initiate interaction.  Problem Solving Problem Solving: 3-Solves basic 50 - 74% of the time/requires cueing 25 - 49% of the time  Memory Memory: 3-Recognizes or recalls 50 - 74% of the time/requires cueing 25 - 49% of the time  Medical Problem List and Plan: 1. 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 DVT Prophylaxis/Anticoagulation: Subcutaneous Lovenox. Monitor platelet counts and any signs of bleeding 3. Pain Management: Tylenol as needed, developing some tone in the right upper extremity at this point I think physical therapy would be appropriate treatment but may need wrist hand orthosis for the right side if this increases 4. Dysphagia. Dysphagia 1 thin liquids. Monitor hydration, encourage po, discussed with patient this is a positive improvement            5. Neuropsych: This patient isnot capable of making decisions on his own behalf  6. Skin/Wound Care: Routine skin checks 7. Fluids/Electrolytes/Nutrition: Routine I/Os with follow-up chemistries 8. Hypertension. No current antihypertensive medication. Patient on hydrochlorothiazide 12.5 mg daily prior to admit  -bp normotensive at present  LOS (Days) 23 A FACE TO FACE EVALUATION WAS PERFORMED  SWARTZ,ZACHARY T 11/13/2014, 10:02 AM

## 2014-11-14 ENCOUNTER — Inpatient Hospital Stay (HOSPITAL_COMMUNITY): Payer: BC Managed Care – PPO | Admitting: Rehabilitation

## 2014-11-14 ENCOUNTER — Inpatient Hospital Stay (HOSPITAL_COMMUNITY): Payer: BC Managed Care – PPO | Admitting: Occupational Therapy

## 2014-11-14 NOTE — Plan of Care (Signed)
Problem: RH Toilet Transfers Goal: LTG Patient will perform toilet transfers w/assist (OT) LTG: Patient will perform toilet transfers with assist, with/without cues using equipment (OT)  Downgraded secondary to decreased safety

## 2014-11-14 NOTE — Progress Notes (Signed)
Subjective/Complaints: Had a good night sleep. No problems this morning. Wife states they need a letter written by MD.   Review of Systems - unable to obtain secondary to severe aphasia  Objective: Vital Signs: Blood pressure 108/60, pulse 62, temperature 97.4 F (36.3 C), temperature source Oral, resp. rate 16, height 6' (1.829 m), weight 117 kg (257 lb 15 oz), SpO2 98 %. No results found. No results found for this or any previous visit (from the past 72 hour(s)).   HEENT: normal,  Cardio: RRR and no murmur Resp: CTA B/L and unlabored GI: BS positive and NT.ND Extremity:  Edema Mild Right Hand Skin:   Intact Neuro: Alert/Oriented, Abnormal Sensory Reduced sensation to pinch on the right side upper and lower, Abnormal Motor 0/5 on muscle testing in the right upper except trace pectoralis  , Aphasia global.  Tone at the right elbow flexor as well as right pronator Ashworth grade 2, finger flexor tone is normal, No pain with full finger extension/ no obvious tone in RLE. Patient appears to have apraxia,  Musc/Skel:  Other no pain with Right shoulder flexion or with external rotation Gen. no acute distress   Assessment/Plan: 1. 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 Physiatrist is providing close team supervision and 24 hour management of active medical problems listed below. Physiatrist and rehab team continue to assess barriers to discharge/monitor patient progress toward functional and medical goals.    FIM: FIM - Bathing Bathing Steps Patient Completed: Chest, Right Arm, Abdomen, Front perineal area, Buttocks, Right upper leg, Left upper leg, Left lower leg (including foot), Right lower leg (including foot) Bathing: 4: Min-Patient completes 8-9 83f 10 parts or 75+ percent  FIM - Upper Body Dressing/Undressing Upper  body dressing/undressing steps patient completed: Thread/unthread left sleeve of pullover shirt/dress, Put head through opening of pull over shirt/dress, Pull shirt over trunk, Thread/unthread right sleeve of pullover shirt/dresss Upper body dressing/undressing: 5: Supervision: Safety issues/verbal cues FIM - Lower Body Dressing/Undressing Lower body dressing/undressing steps patient completed: Thread/unthread right underwear leg, Thread/unthread left underwear leg, Pull underwear up/down, Thread/unthread right pants leg, Thread/unthread left pants leg, Pull pants up/down, Don/Doff left shoe Lower body dressing/undressing: 3: Mod-Patient completed 50-74% of tasks  FIM - Toileting Toileting steps completed by patient: Adjust clothing prior to toileting Toileting Assistive Devices: Grab bar or rail for support Toileting: 2: Max-Patient completed 1 of 3 steps  FIM - Diplomatic Services operational officer Devices: Grab bars Toilet Transfers: 4-To toilet/BSC: Min A (steadying Pt. > 75%), 4-From toilet/BSC: Min A (steadying Pt. > 75%)  FIM - Banker Devices: Teacher, music: 4: Bed > Chair or W/C: Min A (steadying Pt. > 75%), 4: Chair or W/C > Bed: Min A (steadying Pt. > 75%)  FIM - Locomotion: Wheelchair Distance: 150 Locomotion: Wheelchair: 0: Activity did not occur FIM - Locomotion: Ambulation Locomotion: Ambulation Assistive Devices: Occupational hygienist Ambulation/Gait Assistance: 4: Min assist Locomotion: Ambulation: 1: Travels less than 50 ft with minimal assistance (Pt.>75%)  Comprehension Comprehension Mode: Auditory Comprehension: 3-Understands basic 50 - 74% of the time/requires cueing 25 - 50%  of the time  Expression Expression Mode: Verbal Expression: 2-Expresses basic 25 - 49% of the time/requires cueing 50 - 75% of the time. Uses single words/gestures.  Social Interaction Social Interaction: 4-Interacts appropriately 75 - 89%  of the time -  Needs redirection for appropriate language or to initiate interaction.  Problem Solving Problem Solving: 3-Solves basic 50 - 74% of the time/requires cueing 25 - 49% of the time  Memory Memory: 3-Recognizes or recalls 50 - 74% of the time/requires cueing 25 - 49% of the time  Medical Problem List and Plan: 1. 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 DVT Prophylaxis/Anticoagulation: Subcutaneous Lovenox. Monitor platelet counts and any signs of bleeding 3. Pain Management: Tylenol as needed, developing some tone in the right upper extremity at this point I think physical therapy would be appropriate treatment but may need wrist hand orthosis for the right side if this increases 4. Dysphagia. Dysphagia 1 thin liquids. Monitor hydration, encourage po, 5. Neuropsych: This patient isnot capable of making decisions on his own behalf  6. Skin/Wound Care: Routine skin checks 7. Fluids/Electrolytes/Nutrition: Routine I/Os  8. Hypertension. No current antihypertensive medication. Patient on hydrochlorothiazide 12.5 mg daily prior to admit  -bp normotensive at present  LOS (Days) 24 A FACE TO FACE EVALUATION WAS PERFORMED  SWARTZ,ZACHARY T 11/14/2014, 8:58 AM

## 2014-11-14 NOTE — Progress Notes (Signed)
Physical Therapy Session Note  Patient Details  Name: Mario [name] MRN: [id number] Date of Birth: [birth year]  Today's Date: 11/14/2014 PT Individual Time: 1615-1700 PT Individual Time Calculation (min): 45 min   Short Term Goals: Week 4:  PT Short Term Goal 1 (Week 4): = LTG's due to ELOS  Skilled Therapeutic Interventions/Progress Updates:   Skilled session focused on hands on training with wife and pt to perform simulated stairs and gravel box for home entry, gait training, bed mobility to simulated home bed height as well as education on community re-entry, levels of fatigue related to ambulation at home, planning around levels of fatigue, etc.  Pt propelled to therapy gym at mod I level and PT set up mat for "gravel box", small 2" step in front of "curb" step to simulate home entry.  Note that curb step is also attached to railing for deck, therefore PT performed with pt using LBQC to get onto gravel box and then grabbing rail to get onto small step and then onto deck with pt able to place cane onto deck to utilize once up step.  Performed with wife at min A level with tactile and verbal cues for sequencing through steps.  Had wife walk pt back to room with [facility] with wife on pts R side with assist for weight shift and cues to provide to pt for assist.  Educated on decreasing distractions at home during gait to decrease fall risk.  Performed bed mobility in room at S level, however did educate on how to assist with scooting R hip back onto bed in case of forward translation.  Educated on issues as above as well as sleeping positions and safety for RUE.  Wife and pt signaling understanding.  Pt left in w/c with all needs in reach.    Therapy Documentation Precautions:  Precautions Precautions: Fall Precaution Comments: global aphasia, aspiration risk, dense R hemiplegia Restrictions Weight Bearing Restrictions: No   Vital Signs: Therapy Vitals Temp: 97.4 F (36.3 C) Temp Source:  Oral Pulse Rate: 62 Resp: 16 BP: 108/60 mmHg Patient Position (if appropriate): Lying Oxygen Therapy SpO2: 98 % O2 Device: Not Delivered Pain: Pt with no c/o pain during session.    See FIM for current functional status  Therapy/Group: Individual Therapy  Vista Deck 11/14/2014, 7:48 AM

## 2014-11-14 NOTE — Plan of Care (Signed)
Problem: RH KNOWLEDGE DEFICIT Goal: RH STG INCREASE KNOWLEDGE OF HYPERTENSION Patient.caregiver will verbalizes signs and symptoms of hypertension including diet exercise, medication regimen with min assist.  Outcome: Not Progressing Expressive aphasia

## 2014-11-14 NOTE — Progress Notes (Signed)
Occupational Therapy Session Note  Patient Details  Name: Mario [name] MRN: [id number] Date of Birth: January 12, 1976  Today's Date: 11/14/2014 OT Individual Time: 1515-1600 OT Individual Time Calculation (min): 45 min    Short Term Goals: Week 4:  OT Short Term Goal 1 (Week 4): STG= LTGs secondary to upcoming discharge  Skilled Therapeutic Interventions/Progress Updates:  Upon entering the room, pt seated on EOB with wife present in room assisting pt with donning B socks and shoes. Pt performing stand pivot transfer with min A from bed >wheelchair. Pt propelling self via hemiplegic technique to day room with wife present during session. Min A verbal cues to lock wheelchair prior to standing. Pt standing with SBA - steady assist for balance for 17 minutes while engaged in table top task. While standing pt weight bearing through R UE on table. Pt observed to have R knee buckle secondary to fatigue and therapist assisting to block knee while standing the remainder of the time. Pt engaged in table top activity of "old maid" card game. Pt requiring increased time and min verbal cues for scanning to locate matches. Pt required min verbal cues for turn taking during task as well. Pt also able to put random ordered cards 1-12 into order with 2 verbal questioning cues to switch order of cards 7 and 8. Pt returning to room by propelling self in same manner with wife remaining present with pt. Call bell and all needed items within reach.   Therapy Documentation Precautions:  Precautions Precautions: Fall Precaution Comments: global aphasia, aspiration risk, dense R hemiplegia Restrictions Weight Bearing Restrictions: No Vital Signs: Therapy Vitals Temp: 98.1 F (36.7 C) Temp Source: Oral Pulse Rate: 68 Resp: 18 BP: 112/64 mmHg Patient Position (if appropriate): Lying Oxygen Therapy SpO2: 99 % O2 Device: Not Delivered  See FIM for current functional status  Therapy/Group: Individual  Therapy  Lowella Grip 11/14/2014, 5:28 PM

## 2014-11-15 ENCOUNTER — Inpatient Hospital Stay (HOSPITAL_COMMUNITY): Payer: BC Managed Care – PPO | Admitting: Physical Therapy

## 2014-11-15 MED ORDER — GABAPENTIN 100 MG PO CAPS
100.0000 mg | ORAL_CAPSULE | Freq: Three times a day (TID) | ORAL | Status: DC
  Administered 2014-11-15 – 2014-11-18 (×7): 100 mg via ORAL
  Filled 2014-11-15 (×13): qty 1

## 2014-11-15 NOTE — Progress Notes (Signed)
Physical Therapy Session Note  Patient Details  Name: Mario [name] MRN: [id number] Date of Birth: 09/13/1975  Today's Date: 11/15/2014 PT Individual Time: 0800-0830 PT Individual Time Calculation (min): 30 min   Short Term Goals: Week 4:  PT Short Term Goal 1 (Week 4): = LTG's due to ELOS  Skilled Therapeutic Interventions/Progress Updates:   Patient sitting in wheelchair, wife present for session. Donned shoes and R AFO with assist. Continued family training with wife providing min A during ambulation using WBQC from patient's room to and from day room. Patient and wife educated on equal BLE weightbearing with sit <> stand transfers to promote increased RLE WB as patient tended to keep RLE out in front of him, wife verbalized understanding. Wife requested to review previously prescribed RUE self ROM exercises due to increased compliance with staff present compared to just wife in room, which she suspected was due to fatigue after completing therapies. Wife provided appropriate cues and demonstration of RUE exercises for shoulder, elbow, wrist, and digits in all planes and graded number of reps appropriately based on difficulty of exercise for patient. Patient ambulated back to room and left sitting in wheelchair with needs within reach and wife in room.     Therapy Documentation Precautions:  Precautions Precautions: Fall Precaution Comments: global aphasia, aspiration risk, dense R hemiplegia Restrictions Weight Bearing Restrictions: No Pain: Pain Assessment Pain Assessment: No/denies pain Locomotion : Ambulation Ambulation/Gait Assistance: 4: Min assist   See FIM for current functional status  Therapy/Group: Individual Therapy  Kerney Elbe 11/15/2014, 8:31 AM

## 2014-11-15 NOTE — Progress Notes (Signed)
Subjective/Complaints: Having tingling/burning pain in right arm/leg. Uneventful day/night   Review of Systems - unable to obtain secondary to severe aphasia  Objective: Vital Signs: Blood pressure 97/65, pulse 69, temperature 98.5 F (36.9 C), temperature source Oral, resp. rate 18, height 6' (1.829 m), weight 117 kg (257 lb 15 oz), SpO2 97 %. No results found. No results found for this or any previous visit (from the past 72 hour(s)).   HEENT: normal,  Cardio: RRR and no murmur Resp: CTA B/L and unlabored GI: BS positive and NT.ND Extremity:  Edema Mild Right Hand Skin:   Intact Neuro: Alert/Oriented, Abnormal Sensory Reduced sensation to pinch on the right side upper and lower, Abnormal Motor 0/5 on muscle testing in the right upper except trace/5 pectoralis  , Aphasia global.  Tone at the right elbow flexor as well as right pronator Ashworth grade 2, finger flexor tone is normal,  no obvious tone in RLE. Patient appears to have apraxia,  Musc/Skel:  Other no pain with Right shoulder flexion or with external rotation Gen. no acute distress   Assessment/Plan: 1. 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 Physiatrist is providing close team supervision and 24 hour management of active medical problems listed below. Physiatrist and rehab team continue to assess barriers to discharge/monitor patient progress toward functional and medical goals.    FIM: FIM - Bathing Bathing Steps Patient Completed: Chest, Right Arm, Abdomen, Front perineal area, Buttocks, Right upper leg, Left upper leg, Left lower leg (including foot), Right lower leg (including foot) Bathing: 4: Min-Patient completes 8-9 82f 10 parts or 75+ percent  FIM - Upper Body Dressing/Undressing Upper body dressing/undressing steps patient completed:  Thread/unthread left sleeve of pullover shirt/dress, Put head through opening of pull over shirt/dress, Pull shirt over trunk, Thread/unthread right sleeve of pullover shirt/dresss Upper body dressing/undressing: 5: Supervision: Safety issues/verbal cues FIM - Lower Body Dressing/Undressing Lower body dressing/undressing steps patient completed: Thread/unthread right underwear leg, Thread/unthread left underwear leg, Pull underwear up/down, Thread/unthread right pants leg, Thread/unthread left pants leg, Pull pants up/down, Don/Doff left shoe Lower body dressing/undressing: 3: Mod-Patient completed 50-74% of tasks  FIM - Toileting Toileting steps completed by patient: Adjust clothing prior to toileting Toileting Assistive Devices: Grab bar or rail for support Toileting: 2: Max-Patient completed 1 of 3 steps  FIM - Diplomatic Services operational officer Devices: Grab bars Toilet Transfers: 4-To toilet/BSC: Min A (steadying Pt. > 75%), 4-From toilet/BSC: Min A (steadying Pt. > 75%)  FIM - Banker Devices: Cane, Arm rests Bed/Chair Transfer: 4: Bed > Chair or W/C: Min A (steadying Pt. > 75%)  FIM - Locomotion: Wheelchair Distance: 100 Locomotion: Wheelchair: 0: Activity did not occur FIM - Locomotion: Ambulation Locomotion: Ambulation Assistive Devices: Occupational hygienist Ambulation/Gait Assistance: 4: Min assist Locomotion: Ambulation: 2: Travels 50 - 149 ft with minimal assistance (Pt.>75%)  Comprehension Comprehension Mode: Auditory Comprehension: 3-Understands basic 50 - 74% of the time/requires cueing 25 - 50%  of the time  Expression Expression Mode: Nonverbal, Verbal Expression: 2-Expresses basic 25 - 49% of the time/requires cueing 50 - 75% of the time. Uses single words/gestures.  Social Interaction Social Interaction: 4-Interacts appropriately 75 - 89% of the time - Needs redirection for appropriate language or to initiate  interaction.  Problem Solving Problem Solving: 3-Solves basic 50 - 74% of the time/requires cueing  25 - 49% of the time  Memory Memory: 3-Recognizes or recalls 50 - 74% of the time/requires cueing 25 - 49% of the time  Medical Problem List and Plan: 1. 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 DVT Prophylaxis/Anticoagulation: Subcutaneous Lovenox. Monitor platelet counts and any signs of bleeding 3. Pain Management: Tylenol as needed, wife/pt describe it has tingling/burning----will add low dose gabapentin  -ROM/positioning/splinting for emerging tone. Consider prn muscle relaxant also 4. Dysphagia. Dysphagia 1 thin liquids. Monitor hydration, encourage po, 5. Neuropsych: This patient isnot capable of making decisions on his own behalf  6. Skin/Wound Care: Routine skin checks 7. Fluids/Electrolytes/Nutrition: Routine I/Os  8. Hypertension. No current antihypertensive medication. Patient on hydrochlorothiazide 12.5 mg daily prior to admit  -bp normotensive at present  LOS (Days) 25 A FACE TO FACE EVALUATION WAS PERFORMED  Tyric Rodeheaver T 11/15/2014, 9:23 AM

## 2014-11-16 ENCOUNTER — Inpatient Hospital Stay (HOSPITAL_COMMUNITY): Payer: BC Managed Care – PPO | Admitting: Speech Pathology

## 2014-11-16 ENCOUNTER — Inpatient Hospital Stay (HOSPITAL_COMMUNITY): Payer: BC Managed Care – PPO | Admitting: Physical Therapy

## 2014-11-16 ENCOUNTER — Inpatient Hospital Stay (HOSPITAL_COMMUNITY): Payer: BC Managed Care – PPO | Admitting: Occupational Therapy

## 2014-11-16 NOTE — Progress Notes (Signed)
Subjective/Complaints: No discomfort noted during therapy. He appears to be bright and interactive this morning. Wife is getting trained on how to walk with him. Patient remains impulsive and sat down suddenly during his therapy session   Review of Systems - unable to obtain secondary to severe aphasia  Objective: Vital Signs: Blood pressure 103/63, pulse 69, temperature 98.6 F (37 C), temperature source Oral, resp. rate 20, height 6' (1.829 m), weight 117 kg (257 lb 15 oz), SpO2 96 %. No results found. No results found for this or any previous visit (from the past 72 hour(s)).   HEENT: normal,  Cardio: RRR and no murmur Resp: CTA B/L and unlabored GI: BS positive and NT.ND Extremity:  Edema Mild Right Hand Skin:   Intact Neuro: Alert/Oriented, Abnormal Sensory Reduced sensation to pinch on the right side upper and lower, Abnormal Motor 0/5 on muscle testing in the right upper except trace/5 pectoralis  , Aphasia global.  Tone at the right elbow flexor as well as right pronator Ashworth grade 2, finger flexor tone is normal,  no obvious tone in RLE. Patient appears to have apraxia, right lower extremity strength is 3 minus hip knee extensor synergy and some isolated quadriceps as well some plantarflexion at the ankle but no dorsiflexion noted Musc/Skel:  Other no pain with Right shoulder flexion or with external rotation Gen. no acute distress   Assessment/Plan: 1. 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 Physiatrist is providing close team supervision and 24 hour management of active medical problems listed below. Physiatrist and rehab team continue to assess barriers to discharge/monitor patient progress toward functional and medical goals.    FIM: FIM - Bathing Bathing Steps Patient Completed: Chest, Right Arm, Abdomen,  Front perineal area, Buttocks, Right upper leg, Left upper leg, Left lower leg (including foot), Right lower leg (including foot) Bathing: 4: Min-Patient completes 8-9 79f 10 parts or 75+ percent  FIM - Upper Body Dressing/Undressing Upper body dressing/undressing steps patient completed: Thread/unthread left sleeve of pullover shirt/dress, Put head through opening of pull over shirt/dress, Pull shirt over trunk, Thread/unthread right sleeve of pullover shirt/dresss Upper body dressing/undressing: 5: Supervision: Safety issues/verbal cues FIM - Lower Body Dressing/Undressing Lower body dressing/undressing steps patient completed: Thread/unthread right underwear leg, Thread/unthread left underwear leg, Pull underwear up/down, Thread/unthread right pants leg, Thread/unthread left pants leg, Pull pants up/down, Don/Doff left shoe Lower body dressing/undressing: 3: Mod-Patient completed 50-74% of tasks  FIM - Toileting Toileting steps completed by patient: Adjust clothing prior to toileting Toileting Assistive Devices: Grab bar or rail for support Toileting: 2: Max-Patient completed 1 of 3 steps  FIM - Diplomatic Services operational officer Devices: Grab bars Toilet Transfers: 4-To toilet/BSC: Min A (steadying Pt. > 75%), 4-From toilet/BSC: Min A (steadying Pt. > 75%)  FIM - Banker Devices: Cane, Arm rests Bed/Chair Transfer: 4: Bed > Chair or W/C: Min A (steadying Pt. > 75%)  FIM - Locomotion: Wheelchair Distance: 100 Locomotion: Wheelchair: 0: Activity did not occur FIM - Locomotion: Ambulation Locomotion: Ambulation Assistive Devices: Occupational hygienist Ambulation/Gait Assistance: 4: Min assist Locomotion: Ambulation: 4: Travels 150 ft or more with minimal assistance (Pt.>75%)  Comprehension Comprehension Mode: Auditory Comprehension: 3-Understands basic 50 - 74% of the time/requires cueing 25 - 50%  of the time  Expression Expression Mode:  Verbal, Nonverbal Expression: 2-Expresses basic 25 -  49% of the time/requires cueing 50 - 75% of the time. Uses single words/gestures.  Social Interaction Social Interaction: 4-Interacts appropriately 75 - 89% of the time - Needs redirection for appropriate language or to initiate interaction.  Problem Solving Problem Solving: 3-Solves basic 50 - 74% of the time/requires cueing 25 - 49% of the time  Memory Memory: 3-Recognizes or recalls 50 - 74% of the time/requires cueing 25 - 49% of the time  Medical Problem List and Plan: 1. 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 DVT Prophylaxis/Anticoagulation: Subcutaneous Lovenox. Monitor platelet counts and any signs of bleeding 3. Pain Management: Tylenol as needed, wife/pt describe it has tingling/burning----will add low dose gabapentin  -ROM/positioning/splinting for emerging tone. Consider prn muscle relaxant also 4. Dysphagia. Dysphagia 1 thin liquids. Monitor hydration, encourage po, 5. Neuropsych: This patient isnot capable of making decisions on his own behalf  6. Skin/Wound Care: Routine skin checks 7. Fluids/Electrolytes/Nutrition: Routine I/Os  8. Hypertension. No current antihypertensive medication. Patient on hydrochlorothiazide 12.5 mg daily prior to admit  -bp normotensive at present  LOS (Days) 26 A FACE TO FACE EVALUATION WAS PERFORMED  Rowena Moilanen E 11/16/2014, 5:23 PM

## 2014-11-16 NOTE — Progress Notes (Signed)
Occupational Therapy Session Note  Patient Details  Name: Mario [name] MRN: [id number] Date of Birth: Feb 19, 1976  Today's Date: 11/16/2014 OT Individual Time: 1001-1103 OT Individual Time Calculation (min): 62 min    Skilled Therapeutic Interventions/Progress Updates:    Took pt down to the therapy gym for neuromuscular re-education for the RUE during the session.  Began working in quadriped with mod assist for crawling up on the mat.  Placed the LUE on the mat for weightbearing and focused on pt attempting to stabilize the RUE while reaching forward with the LUE.  He continues to need max assist to stabilize the right elbow and shoulder while reaching.  Transitioned to sitting EOM with integration of Bioness H200 NMES.  Pt utilized Exercise program at preset settings for digit flexion and extension.  Incorporated functional reach as well using Grasp Mode for pt to reach for bean bags with the RUE. He continues to exhibit trace shoulder movements with the greatest being internal rotation.  Mr. Dueck continues to need max facilitation for guiding pt during functional reach.  No adverse reactions noted to NMES during session.  Pt with 15 mins total time for NMES.  Therapy Documentation Precautions:  Precautions Precautions: Fall Precaution Comments: global aphasia, aspiration risk, dense R hemiplegia Restrictions Weight Bearing Restrictions: No  Pain: Pain Assessment Pain Assessment: No/denies pain ADL: See FIM for current functional status  Therapy/Group: Individual Therapy  Yarenis Cerino OTR/L 11/16/2014, 12:46 PM

## 2014-11-16 NOTE — Progress Notes (Signed)
Social Work Patient ID: Mario [name], male   DOB: 21-May-1976, 39 y.o.   MRN: [id number] Met with wife who reports have learned education and pt's father and mother coming tomorrow to attend therapies with him to finish education. Will get equipment needed and agreeable to beginning with home health and then transitioning to OP therapies. Work toward discharge wed.

## 2014-11-16 NOTE — Progress Notes (Signed)
Speech Language Pathology Daily Session Note  Patient Details  Name: Mario [name] MRN: [id number] Date of Birth: [birth year]  Today's Date: 11/16/2014 SLP Individual Time: 1400-1500 SLP Individual Time Calculation (min): 60 min  Short Term Goals: Week 4: SLP Short Term Goal 1 (Week 4): Pt will answer basic, immediate, biographical and/or environmental yes/no questions with min assist gestural cuing.   SLP Short Term Goal 2 (Week 4): Pt will initiate vocalizations to produce vowels, words, and/or CV syllables with mod assist visual  cuing.  SLP Short Term Goal 3 (Week 4): Pt will return demonstration of the call bell with min assist verbal cuing.  SLP Short Term Goal 4 (Week 4): Pt will consume trials of advanced consistencies with supervision cues for use of rate and portion control, liquid wash, lingual sweep, and manual assist to monitor and correct right sided buccal residue.   Skilled Therapeutic Interventions:  Pt was seen for skilled ST targeting goals for communication and to address ongoing family education prior to discharge.   Upon arrival, pt was seated upright in wheelchair, awake, alert, and agreeable to participate in ST.  Pt produced /ha, he, who/ with mod verbal and tactile cues to facilitate motor planning for articulation of CV syllables.  Pt was able to produce /m and l/ in isolation with max verbal, visual, and tactile cues.   Of note, pt became very animated at one point during session and gestured to his stomach and his throat.  SLP provided pt with dry erase board and marker to attempt to access a modality in which pt could communicate the cause of his excitement.  Pt initially wrote the word "bawl" and then "law" with supervision.  Pt indicated via gestures and head nods/shakes that the aforementioned words did not accurately reflect what he was trying to say.  Pt's wife was present for the second half of today's therapy session; therefore, SLP provided skilled education  regarding supported conversation techniques to maximize pt's functional independence for communication.  SLP emphasized use of short, simple phrases/sentences, emphasizing key content words, asking yes/no questions starting with a broad topic and gradually becoming more specific, use of demonstration and gestural cues, and verifying communicative intent.  Pt's wife was provided with a handout to facilitate carryover in the home environment.  Pt's wife's questions were answered to her satisfaction at this time.  Continue per current plan of care.    FIM:  Comprehension Comprehension Mode: Auditory Comprehension: 3-Understands basic 50 - 74% of the time/requires cueing 25 - 50%  of the time Expression Expression Mode: Verbal;Nonverbal Expression: 2-Expresses basic 25 - 49% of the time/requires cueing 50 - 75% of the time. Uses single words/gestures. Social Interaction Social Interaction: 4-Interacts appropriately 75 - 89% of the time - Needs redirection for appropriate language or to initiate interaction. Problem Solving Problem Solving: 3-Solves basic 50 - 74% of the time/requires cueing 25 - 49% of the time Memory Memory: 3-Recognizes or recalls 50 - 74% of the time/requires cueing 25 - 49% of the time  Pain Pain Assessment Pain Assessment: No/denies pain  Therapy/Group: Individual Therapy  Aadit Hagood, Melanee Spry 11/16/2014, 4:42 PM

## 2014-11-16 NOTE — Progress Notes (Signed)
Physical Therapy Session Note  Patient Details  Name: Mario [name] MRN: [id number] Date of Birth: 12/26/1975  Today's Date: 11/16/2014 PT Individual Time: 0815-0930 PT Individual Time Calculation (min): 75 min        Therapy Documentation Precautions:  Precautions Precautions: Fall Precaution Comments: global aphasia, aspiration risk, dense R hemiplegia Restrictions Weight Bearing Restrictions: No  Transfers: Sit to and from stand transfer with contact guard assist; verbal cues even weight distribution and controlled descent and hand placement.  Squat pivot transfer from bed to wheelchair min assist  Ambulation: Patient ambulated 250 feet and 150 feet with left wide based quad cane min assist with right blue rocker brace donned and spouse providing assistance.  Patient ambulated with a varied step to and step through gait pattern. Verbal cues provided for both patient and spouse to decrease speed and increased communication during gait secondary to patient's impulsivity.   Patient ambulated 100 feet x2 with left wide based quad cane min assist with right blue rocker brace donned over outdoor uneven and unlevel surfaces. Verbal and tactile cues for facilitation and foot clearance to negotiate uneven terrain. Patient demonstrated increased difficulty with right foot clearance.   Stair negotiation: Patient negotiated 16 steps with left handrail and contact guard assist right blue rocker donned. Patient performed stairs with a step to pattern. Patient educated on proper sequence and technique and was able to return demonstration with minimal verbal cues.    Patient up and down outside curb with min assist. Patient educated on proper sequence and technique.   Patient short sit to supine on bed min assist. Supine to short sit on bed close supervision.   Floor to mat transfer performed with patient, min assist to transition from mat to half kneel and then tall kneel and then to left  side lying with min assist. Sidelying to quadruped to tall kneel to seated edge of mat moderate assistance required.    Standing there ex: Mini squats 30x and 15x. Patient performed second set with spouse providing min assist. Spouse demonstrated proper technique and handling.   Sit to and from stand transfer to and from couch min assist. Patient performed 8 trials without spouse min assist and then 3 trials with spouse min assist. Spouse demonstrated proper technique and handling.    Patient tolerated treatment well. Vitals monitored and remained stable throughout session responding appropriately to activity. Patient was without pain during session. Patient tolerated session with rest breaks throughout. Spouse present during session for family training with ambulation. Spouse able to provide proper hand placement , safety and body mechanics. Spouse and patient educated to have a slower pace and for spouse to be more vocal during ambulation and transfer techniques to maintain safety. Patient returned to room at end of session. Call bell within reach and quick release belt engaged..    See FIM for current functional status Therapy/Group: Individual Therapy  Merri Ray 11/16/2014, 11:13 AM

## 2014-11-17 ENCOUNTER — Inpatient Hospital Stay (HOSPITAL_COMMUNITY): Payer: BC Managed Care – PPO | Admitting: Speech Pathology

## 2014-11-17 ENCOUNTER — Inpatient Hospital Stay (HOSPITAL_COMMUNITY): Payer: BC Managed Care – PPO | Admitting: Rehabilitation

## 2014-11-17 ENCOUNTER — Inpatient Hospital Stay (HOSPITAL_COMMUNITY): Payer: BC Managed Care – PPO | Admitting: Occupational Therapy

## 2014-11-17 NOTE — Plan of Care (Signed)
Problem: RH Bathing Goal: LTG Patient will bathe with assist, cues/equipment (OT) LTG: Patient will bathe specified number of body parts with assist with/without cues using equipment (position) (OT)  Outcome: Not Met (add Reason) Needs max hand over hand assist for washing the LUE.

## 2014-11-17 NOTE — Progress Notes (Signed)
Subjective/Complaints: Mario [name] is in his room working with his wife. His wife states that he is somewhat impulsive. She is getting trained by PT on how to handle his mobility. Also the patient's parents will be coming in today for additional training   Review of Systems - unable to obtain secondary to severe aphasia  Objective: Vital Signs: Blood pressure 120/81, pulse 65, temperature 97.9 F (36.6 C), temperature source Oral, resp. rate 18, height 6' (1.829 m), weight 117 kg (257 lb 15 oz), SpO2 97 %. No results found. No results found for this or any previous visit (from the past 72 hour(s)).   HEENT: normal,  Cardio: RRR and no murmur Resp: CTA B/L and unlabored GI: BS positive and NT.ND Extremity:  Edema Mild Right Hand Skin:   Intact Neuro: Alert/Oriented, Abnormal Sensory Reduced sensation to pinch on the right side upper and lower, Abnormal Motor 0/5 on muscle testing in the right upper except trace/5 pectoralis  , Aphasia global.  Tone at the right elbow flexor as well as right pronator Ashworth grade 2, finger flexor tone is normal,  no obvious tone in RLE. Patient appears to have apraxia, right lower extremity strength is 3 minus hip knee extensor synergy and some isolated quadriceps as well some plantarflexion at the ankle but no dorsiflexion noted Musc/Skel:  Other no pain with Right shoulder flexion or with external rotation Gen. no acute distress   Assessment/Plan: 1. 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 Physiatrist is providing close team supervision and 24 hour management of active medical problems listed below. Physiatrist and rehab team continue to assess barriers to discharge/monitor patient progress toward functional and medical goals.   Should be ready for discharge tomorrow FIM: FIM -  Bathing Bathing Steps Patient Completed: Chest, Right Arm, Abdomen, Front perineal area, Buttocks, Right upper leg, Left upper leg, Left lower leg (including foot), Right lower leg (including foot) Bathing: 4: Min-Patient completes 8-9 59f 10 parts or 75+ percent  FIM - Upper Body Dressing/Undressing Upper body dressing/undressing steps patient completed: Thread/unthread left sleeve of pullover shirt/dress, Put head through opening of pull over shirt/dress, Pull shirt over trunk, Thread/unthread right sleeve of pullover shirt/dresss Upper body dressing/undressing: 5: Supervision: Safety issues/verbal cues FIM - Lower Body Dressing/Undressing Lower body dressing/undressing steps patient completed: Thread/unthread right underwear leg, Thread/unthread left underwear leg, Pull underwear up/down, Thread/unthread right pants leg, Thread/unthread left pants leg, Pull pants up/down, Don/Doff left shoe Lower body dressing/undressing: 3: Mod-Patient completed 50-74% of tasks  FIM - Toileting Toileting steps completed by patient: Adjust clothing prior to toileting Toileting Assistive Devices: Grab bar or rail for support Toileting: 2: Max-Patient completed 1 of 3 steps  FIM - Diplomatic Services operational officer Devices: Grab bars Toilet Transfers: 4-To toilet/BSC: Min A (steadying Pt. > 75%), 4-From toilet/BSC: Min A (steadying Pt. > 75%)  FIM - Banker Devices: Cane, Arm rests Bed/Chair Transfer: 4: Bed > Chair or W/C: Min A (steadying Pt. > 75%)  FIM - Locomotion: Wheelchair Distance: 100 Locomotion: Wheelchair: 0: Activity did not occur FIM - Locomotion: Ambulation Locomotion: Ambulation Assistive Devices: Occupational hygienist Ambulation/Gait Assistance: 4: Min assist Locomotion: Ambulation: 4: Travels 150 ft or more with minimal assistance (Pt.>75%)  Comprehension Comprehension Mode: Auditory Comprehension: 3-Understands basic 50 - 74% of the  time/requires cueing 25 - 50%  of the  time  Expression Expression Mode: Verbal, Nonverbal Expression: 2-Expresses basic 25 - 49% of the time/requires cueing 50 - 75% of the time. Uses single words/gestures.  Social Interaction Social Interaction: 4-Interacts appropriately 75 - 89% of the time - Needs redirection for appropriate language or to initiate interaction.  Problem Solving Problem Solving: 3-Solves basic 50 - 74% of the time/requires cueing 25 - 49% of the time  Memory Memory: 3-Recognizes or recalls 50 - 74% of the time/requires cueing 25 - 49% of the time  Medical Problem List and Plan: 1. 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 DVT Prophylaxis/Anticoagulation: Subcutaneous Lovenox. Monitor platelet counts and any signs of bleeding 3. Pain Management: Tylenol as needed, wife/pt describe it has tingling/burning----will add low dose gabapentin  -ROM/positioning/splinting for emerging tone. Consider prn muscle relaxant also 4. Dysphagia. Dysphagia 1 thin liquids. Monitor hydration, encourage po, 5. Neuropsych: This patient isnot capable of making decisions on his own behalf  6. Skin/Wound Care: Routine skin checks 7. Fluids/Electrolytes/Nutrition: Routine I/Os  8. Hypertension. No current antihypertensive medication. Patient on hydrochlorothiazide 12.5 mg daily prior to admit  -bp normotensive at present  LOS (Days) 27 A FACE TO FACE EVALUATION WAS PERFORMED  Badr Piedra E 11/17/2014, 8:12 AM

## 2014-11-17 NOTE — Discharge Summary (Signed)
NAMEWAYDEN, [name]                ACCOUNT NO.:  [id number]  MEDICAL RECORD NO.:  [id number]  LOCATION:  4W10C                        FACILITY:  [facility]  PHYSICIAN:  Erick Colace, M.D.DATE OF BIRTH:  1975/10/14  DATE OF ADMISSION:  10/21/2014 DATE OF DISCHARGE:  11/18/2014                              DISCHARGE SUMMARY   DISCHARGE DIAGNOSES: 1. Functional deficits secondary to left middle cerebral artery     infarct. 2. Subcutaneous Lovenox for deep vein thrombosis prophylaxis. 3. Dysphagia. 4. Hypertension.  HISTORY OF PRESENT ILLNESS:  This is a 39 year old right-handed male with history of hypertension, independent prior to admission, living with his wife, works as a Airline pilot.  He presented to [facility] on Oct 19, 2014, with acute onset of right-sided weakness, headache, and inability to speak.  Cranial CT scan negative.  MRI of Mario [name] head showed moderate volume of patchy acute infarct, left MCA territory. Most confluent involvement at Mario [name] corona radiata.  No mass effect. Carotid Dopplers with no ICA stenosis.  CTA of Mario [name] head with left M1 occlusion.  Collateral supply of left MCA branch vessels decreased in number and caliber in Mario [name] area of acute infarction.  Echocardiogram with ejection fraction of 65% without emboli.  Mario [name] [name] did not receive t- PA.  Placed on aspirin for CVA prophylaxis.  Subcutaneous Lovenox for DVT prophylaxis.  Pureed diet with honey thick liquids.  Mario [name] [name] was admitted for comprehensive rehab program.  PAST MEDICAL HISTORY:  See discharge diagnoses.  SOCIAL HISTORY:  Lives with spouse, independent prior to admission. Functional status upon admission to rehab services was minimal assist supine to sit, moderate assist bed to chair transfers, min to mod assist activities of daily living.  PHYSICAL EXAMINATION:  VITAL SIGNS:  Blood pressure 128/70, pulse 78, respirations 18, temperature 98.8. GENERAL:  This was an alert male,  globally aphasic, makes eye contact with examiner.  Followed simple commands with heavy tactile cues. LUNGS:  Clear to auscultation. CARDIAC:  Regular rate and rhythm. ABDOMEN:  Soft, nontender.  Good bowel sounds.  REHABILITATION HOSPITAL COURSE:  Mario [name] [name] was admitted to inpatient rehab services with therapies initiated on a 3-hour daily basis consisting of physical therapy, occupational therapy, speech therapy, and rehabilitation nursing.  Mario [name] following issues were addressed during Mario [name] [name]'s rehabilitation stay.  Pertaining to Mario [name] [name]' left MCA infarct, remained stable.  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 He remained on aspirin for CVA prophylaxis.  Subcutaneous Lovenox for DVT prophylaxis with no bleeding episodes.  His diet had been steadily advanced to a mechanical soft thin liquids.  Blood pressures controlled on no current antihypertensive medication.  Angiogram completed per Interventional Radiology, showing occluded left MCA cerebral artery in Mario [name] left M1 segment, 25% to 50% stenosis of Mario [name] right middle cerebral artery proximally.  He will continue on aspirin therapy.  Mario [name] [name] received weekly collaborative interdisciplinary team conferences to discuss estimated length of stay, family teaching, and any barriers to discharge.  Ambulating 250 feet with a left wide base quad cane minimal assist with a right rocker brace.  He ambulated with varied step to step through gait pattern.  Verbal and tactile cues needed.  Negotiated 16 steps with a left hand rail and contact guard assist.  Activities of daily living focused on [name] attempting to stabilize right upper extremity while reaching forward with left upper extremity.  Continued to need some max assistance to stabilize Mario [name] right elbow.  He utilized exercise program for digit flexion and extension.  Slow but progressive gains while on Mario [name] rehab unit.  Mario [name] [name] and family encouraged with overall progress.   Speech Therapy followup for noted global aphasia. Mario [name] [name] can write some simple words with very inconsistent. Emphasis made on asking yes, no questions with simple topics.  Mario [name] [name] was discharged home.  DISCHARGE MEDICATIONS: 1. Aspirin 325 mg p.o. daily. 2. Neurontin 100 mg p.o. t.i.d. 3. Ultram 50 mg p.o. every 6 hours as needed for pain, dispense of 60     tablets.  DIET:  Mechanical soft with thin liquids.  He would follow up with Dr. Claudette [name] on December 25, 2014; Dr. Mickey [name], medical management.     Mario [name] [name], P.A.   ______________________________ Erick Colace, M.D.    DA/MEDQ  D:  11/17/2014  T:  11/17/2014  Job:  [id number]  cc:   Erick Colace, M.D. Mario [name] Farber, MD

## 2014-11-17 NOTE — Discharge Summary (Signed)
Discharge summary job [id number]

## 2014-11-17 NOTE — Progress Notes (Addendum)
Physical Therapy Discharge Summary  Patient Details  Name: Mario [name] MRN: [id number] Date of Birth: 1975-10-20  Today's Date: 11/17/2014 PT Individual Time: 1530-1700 PT Individual Time Calculation (min): 90 min    Patient has met 12 of 12 long term goals due to improved activity tolerance, improved balance, improved postural control, increased strength, ability to compensate for deficits, functional use of  right lower extremity, improved attention, improved awareness and improved coordination.  Patient to discharge at an ambulatory level Min Assist and mod I from w/c standpoint.   Patient's care partner is independent to provide the necessary physical and cognitive assistance at discharge.  Reasons goals not met: n/a  Recommendation:  Patient will benefit from ongoing skilled PT services in home health setting progressing to OP to continue to advance safe functional mobility, address ongoing impairments in decreased balance, decreased strength in RUE/LE, aphasia, apraxia, and minimize fall risk.  Equipment: w/c, cushion, LBQC  Reasons for discharge: treatment goals met and discharge from hospital  Patient/family agrees with progress made and goals achieved: Yes   PT Treatment/Intervention:  Skilled session focused on hands on family education with mother and father to prepare for safe D/C tomorrow as well as grad day activities to ensure pt has met all goals.  PT ambulated with pt in controlled and home environment x 150' with LBQC and R AFO at min A level with cues to pts parents on how to assist and cues to provide for pt.  Also demonstrated on parents how to assist and the need to assist with L weight shift.  Performed bed mobility at mod I level and furniture transfer at S level.  Cues for safety and attending to RUE during tasks.  Pts mother and father then  Ambulated with pt as above with cues to cue pt on increased R step length and ensure safe placement of RLE.  Performed car  transfer to simulated home car height at min A level with cues for foot placement on running board prior to elevating LEs into car.  Both parents returned demonstration.  Ambulated to therapy gym as above and set up steps as home entry is at home.  PT demonstrated first with cane and both parents returned demonstration with cues for sequencing and technique.  Next performed floor transfer x 2 reps with PT first with cues for safety and how to assist back into sitting position as well as when to call 911 vs when to attempt transfer.  Pts father returned demonstration with min cues for safety and technique.  Assessed sensation, coordination, etc, see full details below.  Ended session with w/c mobility in controlled, home and community simulated environment x 300' at mod I level.  Left pt in room in w/c with all needs in reach.  Father in room.    Educated during session on safety concerns, communication difficulties, assisting pt be more active outside of home, and importance of 24/7 S at all times due to pts impulsivity.   PT Discharge Precautions/Restrictions Precautions Precautions: Fall Precaution Comments: global aphasia, aspiration risk, dense R hemiplegia Restrictions Weight Bearing Restrictions: No Vital Signs Therapy Vitals Temp: 98 F (36.7 C) Temp Source: Oral Pulse Rate: 73 Resp: 18 BP: 123/89 mmHg Patient Position (if appropriate): Sitting Oxygen Therapy SpO2: 100 % O2 Device: Not Delivered Pain Pain Assessment Pain Assessment: No/denies pain Pain Score: 0-No pain Vision/Perception  Vision - Assessment Eye Alignment: Within Functional Limits Ocular Range of Motion: Within Functional Limits Alignment/Gaze Preference: Within Defined  Limits Tracking/Visual Pursuits: Able to track stimulus in all quads without difficulty Convergence: Within functional limits Additional Comments: Pt able to identify objects in right periphery without difficulty during confrontational testing.   Cognition Overall Cognitive Status: Impaired/Different from baseline Arousal/Alertness: Awake/alert Orientation Level: Other (comment);Oriented to place;Oriented to person;Oriented to situation (needs written aid and choice of two ) Attention: Selective Focused Attention: Appears intact Sustained Attention: Appears intact Selective Attention: Appears intact Memory: Impaired Memory Impairment: Storage deficit (impacted by receptive language deficits ) Awareness: Impaired Awareness Impairment: Emergent impairment Problem Solving: Impaired Problem Solving Impairment: Functional basic;Verbal basic Executive Function: Self Correcting;Self Monitoring Sequencing: Impaired Sequencing Impairment: Functional basic (Pt needs min to mod instructional cueing to sequence through selfcare tasks) Self Monitoring: Impaired Self Monitoring Impairment: Functional basic Self Correcting: Impaired Self Correcting Impairment: Functional basic Behaviors: Perseveration;Impulsive Safety/Judgment: Impaired Comments: Pt still impulsive at times with sit to stand, needing cueing to slow down and check for positioning of the RLE before attempting to stand.  Pt still with severe expressive difficulties at this time.   Sensation Sensation Light Touch: Not tested Stereognosis: Not tested Hot/Cold: Not tested Proprioception: Not tested Additional Comments: Pt was able to detect pinch on the left hand and arm.  Unable to accurately assess proprioception or light touch secondary to receptive difficulties. Coordination Gross Motor Movements are Fluid and Coordinated: No Fine Motor Movements are Fluid and Coordinated: No Coordination and Movement Description: Pt continues to need max facilitation to integrate the RUE into functional tasks.  Brunnstrum stage II movement in the arm with trace movements noted with internal rotation and shoulder flexion.  No active movement noted in the digits. Heel Shin Test: unable to  perform due to weakness in RLE Motor  Motor Motor: Hemiplegia;Motor apraxia Motor - Discharge Observations: Pt continues with right hemiparesis in the UE and LE  Mobility Bed Mobility Bed Mobility: Supine to Sit;Sit to Supine Supine to Sit: 6: Modified independent (Device/Increase time) Sit to Supine: 6: Modified independent (Device/Increase time) Transfers Transfers: Yes Sit to Stand: 5: Supervision Sit to Stand Details: Verbal cues for sequencing;Verbal cues for technique;Verbal cues for precautions/safety Stand to Sit: 5: Supervision Stand to Sit Details (indicate cue type and reason): Verbal cues for sequencing;Verbal cues for technique;Verbal cues for precautions/safety Stand Pivot Transfers: 4: Min assist Stand Pivot Transfer Details: Verbal cues for sequencing;Verbal cues for technique;Verbal cues for precautions/safety;Manual facilitation for weight shifting Locomotion  Ambulation Ambulation: Yes Ambulation/Gait Assistance: 4: Min assist Ambulation Distance (Feet): 150 Feet Assistive device: Large base quad cane Ambulation/Gait Assistance Details: Verbal cues for sequencing;Verbal cues for technique;Verbal cues for precautions/safety;Manual facilitation for weight shifting Gait Gait: Yes Gait Pattern: Impaired Gait Pattern: Step-to pattern;Decreased step length - right;Decreased step length - left;Decreased stride length;Lateral trunk lean to right;Poor foot clearance - right Stairs / Additional Locomotion Stairs: Yes Stairs Assistance: 4: Min assist Stairs Assistance Details: Verbal cues for sequencing;Verbal cues for technique;Verbal cues for precautions/safety;Manual facilitation for weight shifting;Verbal cues for gait pattern;Verbal cues for safe use of DME/AE Stair Management Technique: One rail Left;Step to pattern;Forwards Number of Stairs: 12 (per 6/13 note) Height of Stairs: 6 Wheelchair Mobility Wheelchair Mobility: Yes Wheelchair Assistance: 6: Modified  independent (Device/Increase time) Wheelchair Assistance Details: Verbal cues for sequencing;Verbal cues for technique;Verbal cues for precautions/safety Wheelchair Propulsion: Left upper extremity;Left lower extremity Wheelchair Parts Management: Supervision/cueing Distance: 200  Trunk/Postural Assessment  Cervical Assessment Cervical Assessment: Within Functional Limits Thoracic Assessment Thoracic Assessment: Within Functional Limits Lumbar Assessment Lumbar Assessment: Within Functional Limits  Postural Control Postural Control: Deficits on evaluation Protective Responses: Pt with some hip strategy, however diminished ankle strategy  Balance Balance Balance Assessed: Yes Static Sitting Balance Static Sitting - Balance Support: No upper extremity supported Static Sitting - Level of Assistance: 7: Independent Dynamic Sitting Balance Dynamic Sitting - Balance Support: No upper extremity supported Dynamic Sitting - Level of Assistance: 6: Modified independent (Device/Increase time) Static Standing Balance Static Standing - Balance Support: Left upper extremity supported Static Standing - Level of Assistance: 5: Stand by assistance Dynamic Standing Balance Dynamic Standing - Balance Support: Left upper extremity supported Dynamic Standing - Level of Assistance: 4: Min assist Extremity Assessment  RUE Assessment RUE Assessment: Exceptions to [facility] RUE Strength RUE Overall Strength Comments: Pt is currently Brunnstrum stage II movement in the right arm and stage I in the hand.  PROM WFLS for all joints with slight shoulder pain present when flexion is greater and 100 degrees, but seems to be better at this time. He demonstrates slight shoulder flexion and internal rotation trace movements at this time.   LUE Assessment LUE Assessment: Within Functional Limits RLE Assessment RLE Assessment: Exceptions to [facility] RLE Strength RLE Overall Strength: Deficits RLE Overall Strength Comments:  hip flex 2-/5, hip ext 3/5, knee flex 2-5, knee ext 2+/5, ankle DF and PF 0/5 LLE Assessment LLE Assessment: Within Functional Limits  See FIM for current functional status  Denice Bors 11/17/2014, 5:39 PM

## 2014-11-17 NOTE — Progress Notes (Signed)
Speech Language Pathology Discharge Summary  Patient Details  Name: Mario [name] MRN: [id number] Date of Birth: 1975/07/14  Today's Date: 11/17/2014 SLP Individual Time: 1433-1530 SLP Individual Time Calculation (min): 57 min   Skilled Therapeutic Interventions: Pt was seen for skilled ST targeting grad day tasks.  Upon arrival, pt was seated upright in wheelchair, awake, alert, and agreeable to participate in [place].  Pt propelled his wheelchair to the therapy room with mod I.  Pt matched word to object from a field of three for 80% accuracy with supervision.  He matched word to phrase from a field of three with min-mod assist verbal and visual cues indicating that auditory comprehension is more intact than reading comprehension.  Pt produced repetitions of CV syllables both in isolation and when alternating between pairs with max assist multimodal cuing.   Accuracy was most consistently achieved with tactile and visual cues for articulatory placement.  Pt correctly produced x1 targeted word with max assist multimodal cuing but was not stimulable for further expansion of real word productions despite melodic intonation, functional phrases, phrase completion, or phonemic and semantic cues.  Pt continues to require mod-max assist visual and verbal cues for use of yes/no function on his communication board; however, he was able to match picture on communication board to a hypothetical situation (i.e. What picture would you point to if you had to go to the bathroom?) with min-mod assist verbal cues.  Pt was also able to write his name, his father and mother's name, and his wife's name with min-mod assist verbal and visual cues.  Pt's father was present at the end of today's session and he was provided with a handout of textures (including foods and textures to avoid as well) on pt's currently prescribed diet to facilitate carryover in the home environment.  Pt's father verbalized understanding of education.  Pt  handed off to PT upright in wheelchair.      Patient has met 7 of 7 long term goals.  Patient to discharge at overall Min;Mod level.  Reasons goals not met:  N/A   Clinical Impression/Discharge Summary:  Pt made functional gains while inpatient and is discharging having met 7 out of 7 long term goals.  Pt is currently mod assist for functional communication via multimodal means, max assist for verbal expression at the word/phrase level, and min assist for auditory comprehension of basic information in 1 and 2 step commands and yes/no questions.  Pt has been stimulable for accurate production of the following phonemes, CV syllables, and words: /he, who, ha, la, m, l, and ring/ with max assist verbal, visual, and tactile cues for phonemic placement.  He perseverates on the word /hurry/.  He is aware of perseveration but requires min-mod cues to correct.  Pt is also min-mod assist for basic cognitive tasks due to perseveration and impulsivity.  He is consuming dys 3 textures and thin liquids with supervision cues for use of swallowing precautions.  Pt is discharging home with 24/7 supervision from his family.  SLP also recommends assistance for medication and financial management and ST follow up at next level of care.  Pt and family education is complete at this time.    Care Partner:  Caregiver Able to Provide Assistance: Yes  Type of Caregiver Assistance: Physical;Cognitive  Recommendation:  Home Health SLP;24 hour supervision/assistance  Rationale for SLP Follow Up: Maximize functional communication;Maximize swallowing safety;Maximize cognitive function and independence;Reduce caregiver burden   Equipment: none recommended by SLP  Reasons for discharge: Discharged from hospital   Patient/Family Agrees with Progress Made and Goals Achieved: Yes   See FIM for current functional status  PageSelinda Orion 11/17/2014, 8:35 AM

## 2014-11-17 NOTE — Progress Notes (Addendum)
Occupational Therapy Discharge Summary  Patient Details  Name: Mario [name] MRN: [id number] Date of Birth: Oct 23, 1975  Today's Date: 11/17/2014 OT Individual Time: 0901-1001 OT Individual Time Calculation (min): 60 min   Session Note:  Pt worked on bathing and dressing during session.  Therapist had him ambulate to the shower with min assist and no assistive device.  He was able to shower sitting on the seat.  Max assist needed to integrate the RUE for washing the left arm.  Pt is however able to exhibit some compensatory strategies and problem solving when attempting to open his shampoo bottle without use of the RUE.  He was able to sequence through 75% of bath without cueing but then needed mod demonstrational cueing to wash his feet using the LH sponge as well as for washing his perianal area.  Dressing more automatic with him being able to initiate and complete dressing when clothing was presented to him on the right side.  Mod assist needed for threading the shoe over the RLE but otherwise he was able to fasten AFO as well as shoe using shoe button.  Pt's wife present for session and observed part of dressing.  Therapist discussed the importance of giving the pt time to problem solve during selfcare tasks and not just instruct him on doing things, but to help give questioning cues to each step or solution as needed and see how he responds before just telling him what to do next.    Patient has met 10 of 11 long term goals due to improved activity tolerance, improved balance, postural control, ability to compensate for deficits, functional use of  LEFT upper and LEFT lower extremity, improved attention and improved awareness.  Patient to discharge at [facility] Assist level.  Patient's care partner is independent to provide the necessary physical and cognitive assistance at discharge.    Reasons goals not met: He continues to need min assist for bathing tasks secondary to not having enough active  movement in the RUE to wash the left arm and hand.   Recommendation:  Patient will benefit from ongoing skilled OT services in home health setting to continue to advance functional skills in the area of BADL, iADL, Vocation and Reduce care partner burden.  Mr. Ballengee continues to demonstrate significant expressive deficits and some receptive deficits secondary to his stroke.  In addition, he needs continued cueing for safety and for sequencing with basic selfcare tasks at this time.  Max hand over hand assistance for RUE use to integrate into functional tasks as he does not attempt to involve the extremity without cueing to do so.  Overall he is at a min assist level for selfcare related transfers but  is approaching min assist level.  Feel he will need 24 hour supervision/assist at discharge as he is a high fall risk and does demonstrate some impulsivity as well.  Recommend continued comprehensive OT to continue progression with selfcare tasks to a modified independent level as well.  His wife has been educated on safe assist with selfcare tasks and functional transfers.    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, dense R hemiplegia Restrictions Weight Bearing Restrictions: No  Pain  No report of pain during session  ADL  See FIM scale for details  Vision/Perception  Vision- History Baseline  Vision/History: Wears glasses Wears Glasses: At all times Vision- Assessment Vision Assessment?: No apparent visual deficits;Yes Eye Alignment: Within Functional Limits Ocular Range of Motion: Within Functional Limits Alignment/Gaze Preference: Within Defined Limits Tracking/Visual Pursuits: Able to track stimulus in all quads without  difficulty Convergence: Within functional limits Visual Fields: No apparent deficits Additional Comments: Pt able to identify objects in right periphery without difficulty during confrontational testing.  Cognition Overall Cognitive Status: Impaired/Different from baseline Arousal/Alertness: Awake/alert Attention: Selective Focused Attention: Appears intact Sustained Attention: Appears intact Selective Attention: Appears intact Memory: Impaired Memory Impairment: Decreased short term memory;Decreased recall of new information Awareness: Impaired Awareness Impairment: Anticipatory impairment Problem Solving: Impaired Problem Solving Impairment: Functional basic Sequencing: Impaired Sequencing Impairment: Functional basic (Pt needs min to mod instructional cueing to sequence through selfcare tasks) Safety/Judgment: Impaired Comments: Pt still impulsive at times with sit to stand, needing cueing to slow down and check for positioning of the RLE before attempting to stand.  Pt still with severe expressive difficulties at this time.   Sensation Sensation Light Touch: Not tested Stereognosis: Not tested Hot/Cold: Not tested Proprioception: Not tested Additional Comments: Pt was able to detect pinch on the left hand and arm.  Unable to accurately assess proprioception or light touch secondary to receptive difficulties. Coordination Gross Motor Movements are Fluid and Coordinated: No Fine Motor Movements are Fluid and Coordinated: No Coordination and Movement Description: Pt continues to need max facilitation to integrate the RUE into functional tasks.  Brunnstrum stage II movement in the arm with trace movements noted with internal rotation and shoulder flexion.  No active movement noted in the digits. Motor  Motor Motor: Hemiplegia;Motor apraxia Motor - Discharge Observations: Pt continues with right hemiparesis in the UE and LE Mobility  Transfers Transfers: Sit to Stand;Stand to  Sit Sit to Stand: 5: Supervision;With upper extremity assist;From chair/3-in-1 Sit to Stand Details: Verbal cues for precautions/safety;Verbal cues for technique Stand to Sit: 5: Supervision Stand to Sit Details (indicate cue type and reason): Verbal cues for technique;Verbal cues for precautions/safety  Trunk/Postural Assessment  Cervical Assessment Cervical Assessment: Within Functional Limits Thoracic Assessment Thoracic Assessment: Within Functional Limits Lumbar Assessment Lumbar Assessment: Within Functional Limits  Balance Balance Balance Assessed: Yes Static Sitting Balance Static Sitting - Balance Support: No upper extremity supported Static Sitting - Level of Assistance: 7: Independent Dynamic Sitting Balance Dynamic Sitting - Balance Support: No upper extremity supported Dynamic Sitting - Level of Assistance: 6: Modified independent (Device/Increase time) Static Standing Balance Static Standing - Balance Support: Left upper extremity supported Static Standing - Level of Assistance: 5: Stand by assistance Dynamic Standing Balance Dynamic Standing - Balance Support: Left upper extremity supported Dynamic Standing - Level of Assistance: 4: Min assist Extremity/Trunk Assessment RUE Assessment RUE Assessment: Exceptions to [facility] RUE Strength RUE Overall Strength Comments: Pt is currently Brunnstrum stage II movement in the right arm and stage I in the hand.  PROM WFLS for all joints with slight shoulder pain present when flexion is greater and 100 degrees, but seems to be better at this time. He demonstrates slight shoulder flexion and internal rotation trace movements at this time.   LUE Assessment LUE Assessment: Within Functional Limits  See FIM for current functional status  Evonne Rinks OTR/L 11/17/2014, 3:09 PM

## 2014-11-17 NOTE — Plan of Care (Signed)
Problem: RH KNOWLEDGE DEFICIT Goal: RH STG INCREASE KNOWLEDGE OF HYPERTENSION Patient.caregiver will verbalizes signs and symptoms of hypertension including diet exercise, medication regimen with min assist.  Outcome: Not Met (add Reason) Patient has expressive aphasia -unable to verbalize

## 2014-11-18 MED ORDER — ASPIRIN 325 MG PO TABS: 325.0000 mg | ORAL_TABLET | Freq: Every day | ORAL | Status: DC

## 2014-11-18 MED ORDER — TRAMADOL HCL 50 MG PO TABS
50.0000 mg | ORAL_TABLET | Freq: Four times a day (QID) | ORAL | Status: DC | PRN
Start: 2014-11-18 — End: 2015-07-23

## 2014-11-18 MED ORDER — PANTOPRAZOLE SODIUM 40 MG PO TBEC: 40.0000 mg | DELAYED_RELEASE_TABLET | Freq: Every day | ORAL | Status: DC

## 2014-11-18 MED ORDER — PANTOPRAZOLE SODIUM 40 MG PO TBEC
40.0000 mg | DELAYED_RELEASE_TABLET | Freq: Every day | ORAL | Status: DC
Start: 2014-11-18 — End: 2014-11-18

## 2014-11-18 MED ORDER — OMEGA 3 1000 MG PO CAPS: 100.0000 mg | ORAL_CAPSULE | Freq: Every day | ORAL | Status: AC

## 2014-11-18 MED ORDER — GABAPENTIN 100 MG PO CAPS: 100.0000 mg | ORAL_CAPSULE | Freq: Three times a day (TID) | ORAL | Status: DC

## 2014-11-18 MED ORDER — CALCIUM-D 600-400 MG-UNIT PO TABS: 1.0000 | ORAL_TABLET | Freq: Every day | ORAL | Status: DC

## 2014-11-18 NOTE — Progress Notes (Signed)
Chaplain responded to request for medical HCPOA. Accd to pt chart, pt unable to make decisions for himself. HCPOA must be completed by the pt so chaplain unable to complete. Pt nurse and CSW informed. Pt discharged.   11/18/14 0900  Clinical Encounter Type  Visited With Health care provider  Referral From Social work;Nurse  Advance Directives (For Healthcare)  Does patient have an advance directive? No  Aniceto Kyser, Mayer Masker, Chaplain 11/18/2014 9:40 AM

## 2014-11-18 NOTE — Discharge Instructions (Signed)
Inpatient Rehab Discharge Instructions  Mario [name] Discharge date and time: No discharge date for patient encounter.   Activities/Precautions/ Functional Status: Activity: activity as tolerated Diet: soft Wound Care: none needed Functional status:  ___ No restrictions     ___ Walk up steps independently ___ 24/7 supervision/assistance   ___ Walk up steps with assistance ___ Intermittent supervision/assistance  ___ Bathe/dress independently ___ Walk with walker     ___ Bathe/dress with assistance ___ Walk Independently    ___ Shower independently __xSTROKE/TIA DISCHARGE INSTRUCTIONS SMOKING Cigarette smoking nearly doubles your risk of having a stroke & is the single most alterable risk factor  If you smoke or have smoked in the last 12 months, you are advised to quit smoking for your health.  Most of the excess cardiovascular risk related to smoking disappears within a year of stopping.  Ask you doctor about anti-smoking medications  [place] Quit Line: 1-800-QUIT NOW  Free Smoking Cessation Classes [phone]  CHOLESTEROL Know your levels; limit fat & cholesterol in your diet  Lipid Panel  No results found for: CHOL, TRIG, HDL, CHOLHDL, VLDL, LDLCALC    Many patients benefit from treatment even if their cholesterol is at goal.  Goal: Total Cholesterol (CHOL) less than 160  Goal:  Triglycerides (TRIG) less than 150  Goal:  HDL greater than 40  Goal:  LDL (LDLCALC) less than 100   BLOOD PRESSURE American Stroke Association blood pressure target is less that 120/80 mm/Hg  Your discharge blood pressure is:  BP: 106/69 mmHg  Monitor your blood pressure  Limit your salt and alcohol intake  Many individuals will require more than one medication for high blood pressure  DIABETES (A1c is a blood sugar average for last 3 months) Goal HGBA1c is under 7% (HBGA1c is blood sugar average for last 3 months)  Diabetes: No known diagnosis of diabetes    No results found for:  HGBA1C   Your HGBA1c can be lowered with medications, healthy diet, and exercise.  Check your blood sugar as directed by your physician  Call your physician if you experience unexplained or low blood sugars.  PHYSICAL ACTIVITY/REHABILITATION Goal is 30 minutes at least 4 days per week  Activity: Increase activity slowly, Therapies: Physical Therapy: Home Health Return to work:   Activity decreases your risk of heart attack and stroke and makes your heart stronger.  It helps control your weight and blood pressure; helps you relax and can improve your mood.  Participate in a regular exercise program.  Talk with your doctor about the best form of exercise for you (dancing, walking, swimming, cycling).  DIET/WEIGHT Goal is to maintain a healthy weight  Your discharge diet is: DIET DYS 3 Room service appropriate?: Yes; Fluid consistency:: Thin  liquids Your height is:  Height: 6' (182.9 cm) Your current weight is: Weight: 117 kg (257 lb 15 oz) Your Body Mass Index (BMI) is:  BMI (Calculated): 30.8  Following the type of diet specifically designed for you will help prevent another stroke.  Your goal weight range is:    Your goal Body Mass Index (BMI) is 19-24.  Healthy food habits can help reduce 3 risk factors for stroke:  High cholesterol, hypertension, and excess weight.  RESOURCES Stroke/Support Group:  Call [phone]   STROKE EDUCATION PROVIDED/REVIEWED AND GIVEN TO PATIENT Stroke warning signs and symptoms How to activate emergency medical system (call 911). Medications prescribed at discharge. Need for follow-up after discharge. Personal risk factors for stroke. Pneumonia vaccine given:  Flu vaccine given:  My questions have been answered, the writing is legible, and I understand these instructions.  I will adhere to these goals & educational materials that have been provided to me after my discharge from the hospital.    _ Walk with assistance    ___ Shower with  assistance ___ No alcohol     ___ Return to work/school ________  Special Instructions:    COMMUNITY REFERRALS UPON DISCHARGE:    Home Health:   PT, OT, SP,SW  Agency:[facility] Phone:[phone]    Date of last service:11/18/2014  Medical Equipment/Items Ordered:WHEELCHAIR, [facility] AND TUB BENCH  Agency/Supplier:[facility]    [phone]   GENERAL COMMUNITY RESOURCES FOR PATIENT/FAMILY: Support Groups:[facility] SUPPORT GROUP   My questions have been answered and I understand these instructions. I will adhere to these goals and the provided educational materials after my discharge from the hospital.  Patient/Caregiver Signature _______________________________ Date __________  Clinician Signature _______________________________________ Date __________  Please bring this form and your medication list with you to all your follow-up doctor's appointments.

## 2014-11-18 NOTE — Progress Notes (Signed)
Social Work Discharge Note Discharge Note  The overall goal for the admission was met for:   Discharge location: [place]  Length of Stay: Yes-28 DAYS  Discharge activity level: Yes-SUPERVISION/MIN LEVEL  Home/community participation: Yes  Services provided included: MD, RD, PT, OT, SLP, RN, CM, TR, Pharmacy and SW  Financial Services: Private Insurance: [place] BC[employer]  Follow-up services arranged: Home Health: [facility]-PT,OT,SP,SW, DME: [facility]-WHEELCHAIR, TUB BENCH & [facility] and Patient/Family has no preference for HH/DME agencies  Comments (or additional information):FAMILY TRAINED REGARDING PT'S CARE, AWARE HE WILL NEED 24 HR CARE AT DC, WIFE ENCOURAGED TO APPLY FOR SSD FOR PT.  Patient/Family verbalized understanding of follow-up arrangements: Yes  Individual responsible for coordination of the follow-up plan: DAD & WIFE  Confirmed correct DME delivered: Elease Hashimoto 11/18/2014    Elease Hashimoto

## 2014-11-18 NOTE — Progress Notes (Signed)
Recreational Therapy Discharge Summary Patient Details  Name: Mario [name] MRN: [id number] Date of Birth: 06/01/1976 Today's Date: 11/18/2014  Long term goals set: 2  Long term goals met: 1  Comments on progress toward goals: Pt has made great progress toward LTG and is discharging home with family to provide 24 hour supervision/min assist.  Reasons goals not met: Pt and wife declined participation in community reintegration once outing date was determined.  Reasons for discharge: discharge from hospital  Patient/family agrees with progress made and goals achieved: Yes  Abed Schar 11/18/2014, 4:25 PM

## 2014-11-18 NOTE — Progress Notes (Signed)
Social Work Patient ID: Mario [name], male   DOB: 07/22/1975, 39 y.o.   MRN: [id number] Father wanted to do HC-POA prior to pt leaving today. Contacted chaplain services and at this time pt is unable to sign for himself and make decisions according to MD's note. Father aware can complete as a OP once following MD feels he is capable of making his own decisions. Father to take form with him and ask following MD about it. Father and wife are here to take pt home.

## 2014-11-18 NOTE — Plan of Care (Signed)
Problem: RH Leisure Awareness Goal: LTG: Patient will participate in community (TR) LTG: Patient will participate in community reintegration activities to increase ability to identify and adapt to barriers, perform living skills, ambulate/propel wheelchair at specific level (TR)  Outcome: Not Met (add Reason) Pt & wife declined participation.

## 2014-12-02 ENCOUNTER — Telehealth: Payer: Self-pay | Admitting: *Deleted

## 2014-12-02 NOTE — Telephone Encounter (Signed)
[facility] called to state that the pt has found an alternative home healthcare service that does not require co-pays on home visits. She informs that the [facility].

## 2014-12-03 DIAGNOSIS — I69321 Dysphasia following cerebral infarction: Secondary | ICD-10-CM | POA: Diagnosis not present

## 2014-12-03 DIAGNOSIS — I69351 Hemiplegia and hemiparesis following cerebral infarction affecting right dominant side: Secondary | ICD-10-CM | POA: Diagnosis not present

## 2014-12-03 DIAGNOSIS — I10 Essential (primary) hypertension: Secondary | ICD-10-CM | POA: Diagnosis not present

## 2014-12-25 ENCOUNTER — Inpatient Hospital Stay: Payer: BC Managed Care – PPO | Admitting: Physical Medicine & Rehabilitation

## 2014-12-25 ENCOUNTER — Encounter: Payer: BC Managed Care – PPO | Attending: Physical Medicine & Rehabilitation

## 2015-02-03 ENCOUNTER — Ambulatory Visit: Payer: BC Managed Care – PPO

## 2015-02-03 ENCOUNTER — Encounter: Payer: Self-pay | Admitting: Rehabilitation

## 2015-02-03 ENCOUNTER — Ambulatory Visit: Payer: BC Managed Care – PPO | Attending: Family Medicine | Admitting: Rehabilitation

## 2015-02-03 DIAGNOSIS — R4701 Aphasia: Secondary | ICD-10-CM

## 2015-02-03 DIAGNOSIS — Z8673 Personal history of transient ischemic attack (TIA), and cerebral infarction without residual deficits: Secondary | ICD-10-CM

## 2015-02-03 DIAGNOSIS — IMO0002 Reserved for concepts with insufficient information to code with codable children: Secondary | ICD-10-CM

## 2015-02-03 DIAGNOSIS — R2681 Unsteadiness on feet: Secondary | ICD-10-CM

## 2015-02-03 DIAGNOSIS — I69359 Hemiplegia and hemiparesis following cerebral infarction affecting unspecified side: Secondary | ICD-10-CM | POA: Diagnosis present

## 2015-02-03 DIAGNOSIS — R531 Weakness: Secondary | ICD-10-CM | POA: Diagnosis present

## 2015-02-03 DIAGNOSIS — R482 Apraxia: Secondary | ICD-10-CM | POA: Insufficient documentation

## 2015-02-03 DIAGNOSIS — I69898 Other sequelae of other cerebrovascular disease: Secondary | ICD-10-CM | POA: Diagnosis present

## 2015-02-03 DIAGNOSIS — R269 Unspecified abnormalities of gait and mobility: Secondary | ICD-10-CM

## 2015-02-03 NOTE — Therapy (Signed)
[facility] [address] [place], [place], [address] Phone: [phone]   Fax:  [phone]  Physical Therapy Evaluation  Patient Details  Name: Mario [name] MRN: [id number] Date of Birth: May 09, 1976 Referring Provider:  Blane Ohara, MD  Encounter Date: 02/03/2015      PT End of Session - 02/03/15 1212    Visit Number 1   Number of Visits 17   Date for PT Re-Evaluation 04/02/15   PT Start Time 1103   PT Stop Time 1148   PT Time Calculation (min) 45 min   Activity Tolerance Patient tolerated treatment well   Behavior During Therapy [facility] for tasks assessed/performed      Past Medical History  Diagnosis Date  . Hypertension     Past Surgical History  Procedure Laterality Date  . No past surgeries      There were no vitals filed for this visit.  Visit Diagnosis:  Abnormality of gait - Plan: PT plan of care cert/re-cert  Hx of ischemic left MCA stroke - Plan: PT plan of care cert/re-cert  Hemiparesis following cerebrovascular accident - Plan: PT plan of care cert/re-cert  Unsteadiness - Plan: PT plan of care cert/re-cert  Weakness due to cerebrovascular accident - Plan: PT plan of care cert/re-cert  Generalized weakness - Plan: PT plan of care cert/re-cert      Subjective Assessment - 02/03/15 1101    Subjective The patient is s/p L MCA on 10/19/2014.  Reports speech is biggest barrier, also is ambulating at home without cane and long distances with [facility].  Per father report, pt very active and tends to do things "his way."     Patient is accompained by: Family member   Pertinent History Father Hessie Knows)   Limitations Walking;House hold activities;Reading;Standing;Writing   Patient Stated Goals Improve speech, be able to walk better, and get functional use of RUE.    Currently in Pain? No/denies   Multiple Pain Sites No            [facility] PT Assessment - 02/03/15 0001    Assessment   Medical  Diagnosis L MCA CVA   Onset Date/Surgical Date 10/19/14   Precautions   Precautions Fall;Other (comment)  aphasia   Required Braces or Orthoses Other Brace/Splint   Other Brace/Splint Wears R blue rocker AFO   Restrictions   Weight Bearing Restrictions No   Balance Screen   Has the patient fallen in the past 6 months Yes   How many times? 2   Has the patient had a decrease in activity level because of a fear of falling?  No   Is the patient reluctant to leave their home because of a fear of falling?  No   Home [employer] information [employer] residence   Living Arrangements Alone;Other (Comment)  father and mother stay with pt during week and on weekend   Available Help at Discharge Family;Available PRN/intermittently  between mother and father, has mostly 24/7 S   Type of Home House   Home Access Stairs to enter   Entrance Stairs-Number of Steps 1   Entrance Stairs-Rails Left   Home Layout One level   Home Equipment Cane - quad;Wheelchair - manual;Tub bench   Prior Function   Level of Independence Requires assistive device for independence;Needs assistance with ADLs;Needs assistance with homemaking  Father assists w/ tying shoes, cooking   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   Leisure Likes to hunt, fish, be active  Cognition   Overall Cognitive Status Within Functional Limits for tasks assessed  difficult to formally assess due to aphasia, [facility] in session   Behaviors Impulsive   Sensation   Light Touch Impaired Detail   Light Touch Impaired Details Impaired RLE;Impaired RUE   Stereognosis Not tested   Hot/Cold Not tested   Proprioception Appears Intact  Not tested formally, reports can tell knee hyperext   Coordination   Gross Motor Movements are Fluid and Coordinated No   Fine Motor Movements are Fluid and Coordinated No   Coordination and Movement Description Decreased coordination and strength in RUE/LE   Tone   Assessment Location Right Lower  Extremity   ROM / Strength   AROM / PROM / Strength Strength   Strength   Overall Strength Deficits   Overall Strength Comments LLE all motions 5/5.  Note blue rocker on RLE, R hip flex 3/5, R knee ext 5/5, R knee flex 3/5, R hip ext 2+/5, R hip abd 4/5, R ankle DF 1/5, R ankle PF 1/5   Transfers   Transfers Sit to Stand;Stand to Sit;Stand Pivot Transfers   Sit to Stand 6: Modified independent (Device/Increase time)   Stand to Sit 6: Modified independent (Device/Increase time)   Stand Pivot Transfers 6: Modified independent (Device/Increase time)   Comments Pt somewhat impulsive with mobility.    Ambulation/Gait   Ambulation/Gait Yes   Ambulation/Gait Assistance 4: Min guard;5: Supervision   Ambulation/Gait Assistance Details Had pt ambulate throughout session without AD to further assess mobility, balance and RLE deficits.  Note that pt demonstrates recurvatum in R knee during terminal stance, slightly circumducted during R swing phase, toe drag (decreased DF) and inversion during initial swing phase.     Ambulation Distance (Feet) 441 Feet  50-60' x 2 reps during eval   Assistive device None   Gait Pattern Decreased arm swing - right;Step-through pattern;Decreased stance time - right;Decreased stride length;Decreased dorsiflexion - right;Decreased weight shift to right;Right circumduction;Right genu recurvatum;Lateral hip instability;Poor foot clearance - right   Ambulation Surface Level;Indoor   Gait velocity 2.01 ft/sec   Stairs Yes   Stairs Assistance 5: Supervision;4: Min guard;4: Min assist   Stairs Assistance Details (indicate cue type and reason) Pt initially performed in alternating fashion, however was unsafe and tends to catch foot and R knee buckles increasing fall risk, therefore education and had pt return demonstration on step to pattern for increased safety.    Stair Management Technique One rail Left   Number of Stairs 4  x 2 reps   Height of Stairs 6   6 minute walk  test results    Aerobic Endurance Distance Walked 441 in 3 minute walk test, no device with supervision.   Balance   Balance Assessed Yes   Standardized Balance Assessment   Standardized Balance Assessment Berg Balance Test   Berg Balance Test   Sit to Stand Able to stand without using hands and stabilize independently   Standing Unsupported Able to stand safely 2 minutes   Sitting with Back Unsupported but Feet Supported on Floor or Stool Able to sit safely and securely 2 minutes   Stand to Sit Sits safely with minimal use of hands   Transfers Able to transfer safely, minor use of hands   Standing Unsupported with Eyes Closed Able to stand 10 seconds safely   Standing Ubsupported with Feet Together Able to place feet together independently and stand 1 minute safely   From Standing, Reach Forward with Outstretched  Arm Can reach confidently >25 cm (10")   From Standing Position, Pick up Object from Floor Able to pick up shoe safely and easily   From Standing Position, Turn to Look Behind Over each Shoulder Looks behind from both sides and weight shifts well   Turn 360 Degrees Able to turn 360 degrees safely but slowly   Standing Unsupported, Alternately Place Feet on Step/Stool Able to complete >2 steps/needs minimal assist   Standing Unsupported, One Foot in Front Able to plae foot ahead of the other independently and hold 30 seconds   Standing on One Leg Able to lift leg independently and hold > 10 seconds  unable to stand for any time on RLE   Total Score 50   RLE Tone   RLE Tone Mild  during R knee flex                           PT Education - 02/03/15 1120    Education provided Yes   Education Details Provided educaiton on R heel cord stretch, however note increased inversion, therefore did not provide for HEP at this time   Person(s) Educated Patient;Parent(s)   Methods Explanation;Demonstration;Tactile cues;Verbal cues   Comprehension Verbalized  understanding;Returned demonstration          PT Short Term Goals - 02/03/15 1235    PT SHORT TERM GOAL #1   Title Pt will improve BERG balance score to 56/56 to decrease fall risk and improve functional mobility.    Baseline 50/56 on 8/31   Time 4   Period Weeks   PT SHORT TERM GOAL #2   Title Pt will improve gait speed to 2.83 ft/sec to improve functional ambulation level to community ambulator.     Baseline 2.01 on 8/31   Time 4   Period Weeks   PT SHORT TERM GOAL #3   Title Pt will ambulate >500' on indoor and outdoor surfaces including grass, inclines and curb step without AD with AFO at S level without overt LOB to indicate safe negotiation of indoor and outdoor surfaces.   Baseline 4   Period Weeks   PT SHORT TERM GOAL #4   Title Pt will negotiate up/down 4 steps in step to pattern with single rail at mod I level with safe technique to traverse community obstacles.     Baseline min/guard to min A on 8/31   Time 4   Period Weeks   PT SHORT TERM GOAL #5   Title Pt will improve to 90' to improve ambulation function and functional endurance.    Baseline 441' on 8/31   Time 4   Period Weeks           PT Long Term Goals - 02/03/15 1256    PT LONG TERM GOAL #1   Title Pts SIS mobility score will improve to 80% to indicate improved perceived functional mobility.     Baseline 66.7% on 8/31   Time 8   Period Weeks   PT LONG TERM GOAL #2   Title Pt will improve ankle DF strength to 3/5 in order to increase safety with household ambulation and begin to address ambulation without use of AFO.    Baseline 1/5 on 8/31   Time 8   Period Weeks   PT LONG TERM GOAL #3   Title Pt will verbalize ability to return to leisure activities to indicate return to community function.     Time  8   Period Weeks   PT LONG TERM GOAL #4   Title Pt will negotiate up/down 4 steps without rail in alternating fashion to traverse community stairs while carrying object.    Time 8   Period  Weeks   PT LONG TERM GOAL #5   Title Pt will demonstrate ability to ambulate up to 150' with decreased R genu recurvatum to improve R knee stability/strength and prevent knee injury.     Baseline 8   Period Weeks   Additional Long Term Goals   Additional Long Term Goals Yes   PT LONG TERM GOAL #6   Title Pt will demonstrate ability to perform SLS x 5 secs on RLE to improve balance and awareness of RLE.    Time 8   Period Weeks               Plan - 02/03/15 1228    Clinical Impression Statement Pt presents with L MCA CVA on 10/19/14 with residual R hemiplegia.  Upon eval, note BERG balance test score of 50/56 with increased reliance on LLE due to decreased WB on RLE, gait speed of 2.01, indicative of a limited community ambulator, and 3 MWT of 441'.  Pt would benefit from continued OP Neuro PT services to address deficits, decrease fall risk and improve functional mobility for increased independence at home.     Pt will benefit from skilled therapeutic intervention in order to improve on the following deficits Abnormal gait;Decreased balance;Decreased coordination;Decreased knowledge of precautions;Decreased safety awareness;Decreased strength;Difficulty walking;Impaired perceived functional ability;Impaired sensation;Impaired flexibility;Impaired tone;Impaired UE functional use;Postural dysfunction;Decreased knowledge of use of DME   Rehab Potential Excellent   Clinical Impairments Affecting Rehab Potential adhering to safety precautions   PT Frequency 2x / week   PT Duration 8 weeks   PT Treatment/Interventions ADLs/Self Care Home Management;Electrical Stimulation;Biofeedback;DME Instruction;Gait training;Stair training;Functional mobility training;Therapeutic exercise;Balance training;Neuromuscular re-education;Patient/family education;Manual techniques;Visual/perceptual remediation/compensation   PT Next Visit Plan R knee stability/quad strengthening, hamstring strengthening, HEP w/  heel cord stretch, balance   Consulted and Agree with Plan of Care Patient;Family member/caregiver   Family Member Consulted Father Hessie Knows)         Problem List Patient Active Problem List   Diagnosis Date Noted  . 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 Right hemiparesis 10/21/2014  . Aphasia due to stroke 10/21/2014    Harriet Butte, PT, MPT [facility] [address] [place], [place], [address] Phone: [phone]   Fax:  [phone] 02/03/2015, 2:30 PM   Evaluation completed by: Margretta Ditty, PT

## 2015-02-04 ENCOUNTER — Encounter: Payer: Self-pay | Admitting: Occupational Therapy

## 2015-02-04 ENCOUNTER — Ambulatory Visit: Payer: BC Managed Care – PPO | Attending: Family Medicine | Admitting: Occupational Therapy

## 2015-02-04 DIAGNOSIS — R482 Apraxia: Secondary | ICD-10-CM | POA: Diagnosis present

## 2015-02-04 DIAGNOSIS — R4189 Other symptoms and signs involving cognitive functions and awareness: Secondary | ICD-10-CM | POA: Diagnosis present

## 2015-02-04 DIAGNOSIS — R2681 Unsteadiness on feet: Secondary | ICD-10-CM | POA: Insufficient documentation

## 2015-02-04 DIAGNOSIS — G8111 Spastic hemiplegia affecting right dominant side: Secondary | ICD-10-CM | POA: Insufficient documentation

## 2015-02-04 DIAGNOSIS — R201 Hypoesthesia of skin: Secondary | ICD-10-CM | POA: Insufficient documentation

## 2015-02-04 DIAGNOSIS — I69898 Other sequelae of other cerebrovascular disease: Secondary | ICD-10-CM | POA: Diagnosis present

## 2015-02-04 DIAGNOSIS — M6281 Muscle weakness (generalized): Secondary | ICD-10-CM | POA: Insufficient documentation

## 2015-02-04 DIAGNOSIS — M25511 Pain in right shoulder: Secondary | ICD-10-CM | POA: Insufficient documentation

## 2015-02-04 DIAGNOSIS — I69359 Hemiplegia and hemiparesis following cerebral infarction affecting unspecified side: Secondary | ICD-10-CM | POA: Insufficient documentation

## 2015-02-04 DIAGNOSIS — R531 Weakness: Secondary | ICD-10-CM | POA: Diagnosis present

## 2015-02-04 DIAGNOSIS — R4701 Aphasia: Secondary | ICD-10-CM | POA: Diagnosis present

## 2015-02-04 DIAGNOSIS — R269 Unspecified abnormalities of gait and mobility: Secondary | ICD-10-CM | POA: Diagnosis present

## 2015-02-04 NOTE — Therapy (Signed)
[facility] [address] [place], [place], [address] Phone: [phone]   Fax:  [phone]  Speech Language Pathology Evaluation  Patient Details  Name: MARKICE TORBERT MRN: [id number] Date of Birth: 04/09/1976 Referring Provider:  Blane Ohara, MD  Encounter Date: 02/03/2015      End of Session - 02/04/15 0943    Visit Number 1   Number of Visits 16   Date for SLP Re-Evaluation 05/05/15   SLP Start Time 1317   SLP Stop Time  1401   SLP Time Calculation (min) 44 min   Activity Tolerance Patient tolerated treatment well      Past Medical History  Diagnosis Date  . Hypertension     Past Surgical History  Procedure Laterality Date  . No past surgeries      There were no vitals filed for this visit.  Visit Diagnosis: Verbal apraxia  Receptive aphasia  Expressive aphasia      Subjective Assessment - 02/03/15 1329    Subjective "Yes", upon meeting SLP   Patient is accompained by: Family member  father   Currently in Pain? No/denies            SLP Evaluation [facility] - 02/03/15 1329    SLP Visit Information   SLP Received On 02/03/15   Onset Date Oct 18, 2014   Medical Diagnosis CVA   Auditory Comprehension   Overall Auditory Comprehension Impaired   Yes/No Questions Impaired   Basic Biographical Questions 51-75% accurate  75%   Other Yes/No Questions Comments` --  simple logical - 63%    Commands Impaired   One Step Basic Commands 75-100% accurate  85%   Two Step Basic Commands 0-24% accurate  0%   Conversation Simple   EffectiveTechniques Repetition;Extra processing time;Slowed speech;Visual/Gestural cues   Expression   Primary Mode of Expression Nonverbal - gestures   Verbal Expression   Overall Verbal Expression Impaired   Initiation No impairment   Automatic Speech Social Response   Level of Generative/Spontaneous Verbalization Word  minimal   Repetition Impaired   Level of  Impairment Word level   Non-Verbal Means of Communication Gestures;Writing;Drawing   Other Verbal Expression Comments Profound expressive language deficits compounded by apraxia of speech   Written Expression   Dominant Hand Right   Written Expression Exceptions to [facility]   Copy Ability Word   Dictation Ability --  pt 0/3 with picture naming   Self Formulation Ability --  name only   Overall Writen Expression Appears commensurate with verbal abilty   Motor Speech   Overall Motor Speech Impaired  voicing and addition/intrusion errors with consonant-vowels   Articulation Impaired   Level of Impairment Word   Motor Planning Impaired   Level of Impairment Word   Motor Speech Errors Unaware;Aware;Groping for words;Consistent   Interfering Components --  severity of deficit, presence of aphasia   Phonation --   Standardized Assessments   Standardized Assessments  [facility] Examiniation-3rd                         SLP Education - 02/04/15 [phone]    Education provided Yes   Education Details simple sound combinations, compensations/multi-modal communication to use at home   Person(s) Educated Patient;Parent(s)   Methods Explanation;Demonstration;Verbal cues   Comprehension Verbalized understanding;Need further instruction          SLP Short Term Goals - 02/04/15 0945    SLP SHORT TERM  GOAL #1   Title pt to perform rote speech tasks (simultaneous with SLP/family) with 25% success, functional responses allowed   Time 4   Period Weeks  or 8 visits - for all STGs   Status New   SLP SHORT TERM GOAL #2   Title pt will imitate consonant vowel syllables/words with written, picture, and other nonverbal cues, 60% success, functional responses allowed   Time 4   Period Weeks   Status New   SLP SHORT TERM GOAL #3   Title pt will functionally communicate with simple augmentative/alternative device (via pictures/texting/typing) for basic wants/needs 60%  success, in viable opportunities   Time 4   Period Weeks   Status New   SLP SHORT TERM GOAL #4   Title pt will demo understanding of simple conversational questions via yes/no responses at 80% accuracy   Time 4   Period Weeks   Status New   SLP SHORT TERM GOAL #5   Title pt will write name and address with rare nonverbal cues and no signs aphasia   Time 4   Period Weeks   Status New          SLP Long Term Goals - 02/04/15 0955    SLP LONG TERM GOAL #1   Title pt will demo undertanding of simple/mod complex conversational questions via yes/no responses 80% accuracy   Time 8   Period Weeks  or 16 visits - for all LTGs   Status New   SLP LONG TERM GOAL #2   Title pt will demo undertanding of simple conversational quesitons via yes/no responses 90% accuracy   Time 8   Period Weeks   Status New   SLP LONG TERM GOAL #3   Title pt will perform rote speech tasks simultaneously with SLP with 40% accuracy (functional responses allowed)   Time 8   Period Weeks   Status New   SLP LONG TERM GOAL #4   Title pt will imitate salient words/family names with 20% success   Time 8   Period Weeks   Status New   SLP LONG TERM GOAL #5   Title pt will demo use of simple alternative/augmentative communcation device or multimodal communication with 70% success in message conveyance   Time 8   Period Weeks   Status New          Plan - 02/04/15 0944    Clinical Impression Statement Pt presents with profound expressive aphasia, as well as mod-severe receptive aphasia, better if context known.   Speech Therapy Frequency 2x / week  SLP offered x3/week but pt declined   Duration --  8 weeks, or 16 visits whichever occurs first   Treatment/Interventions SLP instruction and feedback;Compensatory strategies;Internal/external aids;Environmental controls;Patient/family education;Functional tasks;Cueing hierarchy;Multimodal communcation approach   Potential to Achieve Goals Fair   Potential  Considerations Severity of impairments        Problem List Patient Active Problem List   Diagnosis Date Noted  . 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 Right hemiparesis 10/21/2014  . Aphasia due to stroke 10/21/2014    [facility] , MS, CCC-SLP  02/04/2015, 10:18 AM  [facility] [address] [place], [place], [address] Phone: [phone]   Fax:  [phone]

## 2015-02-04 NOTE — Therapy (Signed)
[facility] [address] [place], [place], [address] Phone: [phone]   Fax:  [phone]  Occupational Therapy Evaluation  Patient Details  Name: Mario [name] MRN: [id number] Date of Birth: [birth year] Referring Provider:  Blane Ohara, MD  Encounter Date: 02/04/2015      OT End of Session - 02/04/15 1132    Visit Number 1   Number of Visits 16   Date for OT Re-Evaluation 04/01/15   Authorization Type [employer] state health PPO no visit limit   OT Start Time 0930   OT Stop Time 1014   OT Time Calculation (min) 44 min   Activity Tolerance Patient tolerated treatment well      Past Medical History  Diagnosis Date  . Hypertension     Past Surgical History  Procedure Laterality Date  . No past surgeries      There were no vitals filed for this visit.  Visit Diagnosis:  Spastic hemiplegia affecting right dominant side - Plan: Ot plan of care cert/re-cert  Pain in joint, shoulder region, right - Plan: Ot plan of care cert/re-cert  Muscle weakness - Plan: Ot plan of care cert/re-cert  Impaired sensation - Plan: Ot plan of care cert/re-cert  Impaired cognition - Plan: Ot plan of care cert/re-cert      Subjective Assessment - 02/04/15 0930    Subjective  Pt globally aphasic but nodded yes and pointed to right arm when asked if he wanted to work on that.   Pertinent History See epic snaphshot, L MCA CVA, HTN   Patient Stated Goals Per father, speech, RUE and walking to improve   Currently in Pain? Yes   Pain Score --  pt unable to rate - grimaces with shoulder flexion beyond 30*   Pain Location Shoulder   Pain Orientation Right   Pain Descriptors / Indicators --  pt unable to describe   Pain Type Chronic pain   Pain Onset More than a month ago   Aggravating Factors  unable to state - father reports pt is constantlyworking on RUE and occassionally grimaces   Pain Relieving Factors unknown            [facility] OT Assessment - 02/04/15 0001    Assessment   Diagnosis L MCA CVA   Onset Date 10/19/14   Prior Therapy inpt rehab  PT/OT/ST and then [facility] all three therapies.   Precautions   Precautions None   Restrictions   Weight Bearing Restrictions No   Balance Screen   Has the patient fallen in the past 6 months Yes   How many times? 2   Home  Environment   Family/patient expects to be discharged to: Private residence   Living Arrangements Parent  mon and dad share responsibility of care   Available Help at Discharge Available 24 hours/day   Type of Home House   Home Layout Two level   Bathroom Shower/Tub Sport and exercise psychologist   Additional Comments Pt has one grab bar in shower, transfer tub bench, no equipment around the toilet   Prior Function   Level of Independence Independent   Vocation On disability   Vocation Requirements tax auditor, PI, biological clean up   Leisure likes to [place], fish and be active outdoors   ADL   Eating/Feeding Minimal assistance  pt has to use non dominant hand to eat   Grooming Modified independent  uses electric shaver and LUE   Upper Body Bathing  Modified independent   Lower Body Bathing Modified independent   Upper Body Dressing --  mod I   Lower Body Dressing Minimal assistance  unable to tie shoes; zipping is difficult but pt can do it   Leisure centre manager Modified independent   Toileting - Clothing Manipulation Modified independent   Toileting -  Hygiene Modified Independent   Tub/Shower Transfer Modified independent   IADL   Shopping Needs to be accompanied on any shopping trip   Light Housekeeping Performs light daily tasks but cannot maintain acceptable level of cleanliness   Meal Prep Needs to have meals prepared and served   [employer] on family or friends for transportation   Medication Management Is not capable of dispensing or managing own medication  pt needs assist to open pill bottles     Financial Management Dependent  due to language deficits   Mobility   Mobility Status History of falls   Written Expression   Dominant Hand Right   Vision - History   Baseline Vision No visual deficits   Vision Assessment   Eye Alignment Impaired (comment)   Ocular Range of Motion Within Functional Limits   Alignment/Gaze Preference Within Defined Limits   Tracking/Visual Pursuits Able to track stimulus in all quads without difficulty   Saccades --  unable to assess due to language impairments   Visual Fields No apparent deficits   Activity Tolerance   Activity Tolerance Tolerate 30+ min activity without fatigue   Cognition   Overall Cognitive Status Impaired/Different from baseline   Mini Mental State Exam  --  memory, attention,    Sensation   Light Touch Impaired by gross assessment  able to feel but difficulty with location   Hot/Cold Impaired by gross assessment  can feel but feels dul when questioned   Proprioception --  appears intact in LE will assess via functional activity UE   Coordination   Gross Motor Movements are Fluid and Coordinated No   Fine Motor Movements are Fluid and Coordinated No   Other Pt unable to participate in standardized tests due to lack of  AROM   Edema   Edema mild in R hand   Tone   Assessment Location Right Upper Extremity   ROM / Strength   AROM / PROM / Strength AROM;PROM;Strength   AROM   Overall AROM  Deficits   Overall AROM Comments Pt with active shoulder flexion to approximately 80* with flexed elbow, abduction to approximately 70* with elbow flexion, full elbow flexion, begining elbow extension, tone to fist, unable to extend fingers. All movements with signfiicant synergy influence   PROM   Overall PROM  Deficits   Overall PROM Comments Overall WFL's except for shoulder abduction (to 90*)   Strength   Overall Strength Deficits;Unable to assess  due to tone   Hand Function   Comment Pt with no isolated movement in R hand    RUE Tone   RUE Tone Moderate  with quick jerk and with yawning, coughing, sneezing                           OT Short Term Goals - 02/04/15 1116    OT SHORT TERM GOAL #1   Title Pt will  and family member will be mod I with HEP - 03/04/2015   Status New   OT SHORT TERM GOAL #2   Title Pt will demonstrate ability to complete shoulder flexion to 50*  without eblow flexion in blilateral reach to work toward functional ow reach with RUE.   Status New   OT SHORT TERM GOAL #3   Title Pt will demonstrate understanding of AE for cutting and tying shoes   Status New   OT SHORT TERM GOAL #4   Title Pt will demonstrate beginning isolated grasp in R hand in prepartion for RUE use in functional tasks.   Status New   OT SHORT TERM GOAL #5   Title Pt will demonstrate ability to use RUE as stabilizer at least 50% of the time during basic ADL tasks.   Status New           OT Long Term Goals - 02/04/15 1121    OT LONG TERM GOAL #1   Title Pt and family will be mod I with updated HEP - 04/01/2015   Status New   OT LONG TERM GOAL #2   Title Pt will demonstrate ability for alternating attention for familiar functional tasks (i.e. cooking task)   Status New   OT LONG TERM GOAL #3   Title Pt will demonstrate ability for low unilateral reach with min a  for hand grasp only during a functional task    Status New   OT LONG TERM GOAL #4   Title Pt will demonstrate ability to use RUE as gross assist for at least 50% of basic self care tasks.   Status New   OT LONG TERM GOAL #5   Title Pt will be able to grasp light cylindrical object with no more than min a.   Status New   Long Term Additional Goals   Additional Long Term Goals Yes   OT LONG TERM GOAL #6   Title Pt will be able to cast a fisihing pole with RUE with no more than mod a.   Status New   OT LONG TERM GOAL #7   Title Pt will be mod I with hot meal prep   Status New               Plan - 02/04/15 1126     Clinical Impression Statement Pt is a 39 year old male s/p L MCA CVA on 10/19/2014. Pt participated in inpt rehab and HH therapies prior to attending to outpt today. Pt's father is with pt today and assisting in answering questions due to language deficits. Pt presents with the following deficits that impact his ability to complete ADL and IADL tasks:  R spastic hemiplegia affecting dominant side,  pain in R shoulder, edema in R hand, decreased AROM, decrease PROM, increased tone, decreased sensation, decreased strength, impaired memory and attention.  Pt will benefit from skilled OT to address these deficits to maximize independent functioning.  Pt is extremely motivated to partiicpate and has great support from  father and mother who are sharing in the assistance of the pt.    Pt will benefit from skilled therapeutic intervention in order to improve on the following deficits (Retired) Abnormal gait;Decreased balance;Decreased coordination;Decreased cognition;Decreased knowledge of use of DME;Decreased mobility;Decreased range of motion;Decreased strength;Increased edema;Impaired UE functional use;Impaired tone;Impaired sensation;Pain   Rehab Potential Good   Clinical Impairments Affecting Rehab Potential global aphasia   OT Frequency 2x / week   OT Duration 8 weeks   OT Treatment/Interventions Self-care/ADL training;Ultrasound;Moist Heat;Electrical Stimulation;DME and/or AE instruction;Neuromuscular education;Therapeutic exercise;Functional Mobility Training;Manual Therapy;Passive range of motion;Splinting;Therapeutic activities;Balance training;Patient/family education;Cognitive remediation/compensation   Plan initiate HEP   Consulted and Agree with Plan of Care  Patient;Family member/caregiver   Family Member Consulted father        Problem List Patient Active Problem List   Diagnosis Date Noted  . 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 Left middle cerebral artery stroke  10/21/2014  . Right hemiparesis 10/21/2014  . Aphasia due to stroke 10/21/2014    Norton Pastel, OTR/L 02/04/2015, 11:37 AM  [facility] [address] [place], [place], [address] Phone: [phone]   Fax:  [phone]

## 2015-02-04 NOTE — Patient Instructions (Signed)
"  Talk" with gestures, drawing, speaking - anything goes to communicate!  Work with imitating simple two sound combinations at home: me, moo, bye, pie, my, boo, pooh, bee, pea

## 2015-02-09 ENCOUNTER — Encounter: Payer: Self-pay | Admitting: Rehabilitation

## 2015-02-09 ENCOUNTER — Ambulatory Visit: Payer: BC Managed Care – PPO | Admitting: Rehabilitation

## 2015-02-09 ENCOUNTER — Encounter: Payer: Self-pay | Admitting: Occupational Therapy

## 2015-02-09 ENCOUNTER — Ambulatory Visit: Payer: BC Managed Care – PPO

## 2015-02-09 ENCOUNTER — Ambulatory Visit: Payer: BC Managed Care – PPO | Admitting: Occupational Therapy

## 2015-02-09 DIAGNOSIS — R201 Hypoesthesia of skin: Secondary | ICD-10-CM

## 2015-02-09 DIAGNOSIS — R269 Unspecified abnormalities of gait and mobility: Secondary | ICD-10-CM

## 2015-02-09 DIAGNOSIS — R4701 Aphasia: Secondary | ICD-10-CM

## 2015-02-09 DIAGNOSIS — M6281 Muscle weakness (generalized): Secondary | ICD-10-CM

## 2015-02-09 DIAGNOSIS — M25511 Pain in right shoulder: Secondary | ICD-10-CM

## 2015-02-09 DIAGNOSIS — R2681 Unsteadiness on feet: Secondary | ICD-10-CM

## 2015-02-09 DIAGNOSIS — R482 Apraxia: Secondary | ICD-10-CM

## 2015-02-09 DIAGNOSIS — IMO0002 Reserved for concepts with insufficient information to code with codable children: Secondary | ICD-10-CM

## 2015-02-09 DIAGNOSIS — R531 Weakness: Secondary | ICD-10-CM

## 2015-02-09 DIAGNOSIS — G8111 Spastic hemiplegia affecting right dominant side: Secondary | ICD-10-CM | POA: Diagnosis not present

## 2015-02-09 DIAGNOSIS — R4189 Other symptoms and signs involving cognitive functions and awareness: Secondary | ICD-10-CM

## 2015-02-09 NOTE — Patient Instructions (Signed)
Home program for your arm:  DO these 2 times a day every day. STOP if you get shoulder pain and let your therapist know right away.  1.  Lay on your back.  Hold PVC square in both hands.  SLOWLY raise the square over your head until both elbows are straight.  KEEPING ELBOWS STRAIGHT, raise the square over your head as far as you can while still maintaining a straight elbow then reach for your knees. DO NOT LIFT YOUR HEAD. Go slow and do not use momentum. Do 10, rest then do 10 more. Your dad may need to help you keep your elbow straight  2. Lay on your back. Hold PVC square in both hands. SLOWLY raise the square over your head until both elbows are straight, hold for a slow a count of 5, then lower back to your chest. GO SLOWLY so your tone doesn't increase and your arm doesn't get too tight.  Do 10, rest then do 10 more.  Your dad may need to help you keep your elbow straight.  3. Sit on a firm surface. Hold one bar of the PVC square with both hand with palms facing down.  Straighten arms until both elbows are straight. KEEPING ELBOWS STRAIGHT raise bar up toward chest level as far as you can while keeping your right elbow straight. DO NOT OVER USE YOUR left arm.  GO SLOW and do not use momentum. Do 10, rest then do 10 more. Your dad may need to help you with the weigh to of your arm and to keep your elbow straight.

## 2015-02-09 NOTE — Therapy (Signed)
[facility] [address] [place], [place], [address] Phone: [phone]   Fax:  [phone]  Physical Therapy Treatment  Patient Details  Name: Mario [name] MRN: [id number] Date of Birth: 09/03/1975 Referring Provider:  Blane Ohara, MD  Encounter Date: 02/09/2015      PT End of Session - 02/09/15 0859    Visit Number 2   Number of Visits 17   Date for PT Re-Evaluation 04/02/15   PT Start Time 0807   PT Stop Time 0846   PT Time Calculation (min) 39 min   Activity Tolerance Patient tolerated treatment well   Behavior During Therapy [facility] for tasks assessed/performed      Past Medical History  Diagnosis Date  . Hypertension     Past Surgical History  Procedure Laterality Date  . No past surgeries      There were no vitals filed for this visit.  Visit Diagnosis:  Abnormality of gait  Unsteadiness  Weakness due to cerebrovascular accident  Generalized weakness      Subjective Assessment - 02/09/15 0810    Patient is accompained by: Family member   Limitations Walking;House hold activities;Reading;Standing;Writing   Currently in Pain? No/denies                         [facility] Adult PT Treatment/Exercise - 02/09/15 0001    Transfers   Transfers Sit to Stand;Stand to Sit;Stand Pivot Transfers   Sit to Stand 6: Modified independent (Device/Increase time)   Stand to Sit 6: Modified independent (Device/Increase time)   Stand Pivot Transfers 6: Modified independent (Device/Increase time)   Comments Pt somewhat impulsive with mobility.    Ambulation/Gait   Ambulation/Gait Yes   Ambulation/Gait Assistance 4: Min guard;5: Supervision   Ambulation/Gait Assistance Details Continue to have pt ambulate during session without AD in order to further challenge balance and encourage WB and weight shift to RLE.  Provided tactile cues at hip for increased glute activation as well as at R knee to  prevent hyperextension during terminal stance phase of gait.  Cues for slower gait speed to increase focus on RLE contraction.    Ambulation Distance (Feet) 345 Feet     Assistive device None   Gait Pattern Decreased arm swing - right;Step-through pattern;Decreased stance time - right;Decreased stride length;Decreased dorsiflexion - right;Decreased weight shift to right;Right circumduction;Right genu recurvatum;Lateral hip instability;Poor foot clearance - right   Ambulation Surface Level;Indoor   Gait velocity --   Stairs Yes   Stairs Assistance 5: Supervision;4: Min guard;4: Min assist   Stairs Assistance Details (indicate cue type and reason) Performed x 3 reps in order to assess safety with stairs in step to fashion.  Pt able to return demonstration at S level.  Performed second time in same manner with tactile and verbal cues for increased hip and knee flex to clear step rather than circumduction compensation.  Performed 3rd rep leading with RLE to further engage quads and glutes.  Facilitation for increased hip/knee flex to ascend to next step, and facilitation at R knee to prevent hyperextension.     Stair Management Technique One rail Left   Number of Stairs 4  x 3 reps   Height of Stairs 6   Self-Care   Self-Care Other Self-Care Comments   Other Self-Care Comments  Educated and provided pt with HEP and performed as below.  Seated HC stretch x 2 reps of 60 secs with belt (assist  to prevent ankle inversion), prone knee flex x 15 reps with tactile cues at R hip to avoid compensation, and mini squat x 15 reps with focus on slow controlled and graded movement.   See pt instruction for further detail.    Neuro Re-ed    Neuro Re-ed Details  Performed stairs for quad/glute activation, see stair section for details.  Performed TKE in // bars with red theraband x 15 reps with visual target for knee and tactile cues to prevent knee hyperextension.  Noted marked improvement.                   PT Education - 02/09/15 0846    Education provided Yes   Education Details HEP, see pt instruction.  Education on using brace at home at all times, safety with stairs.    Person(s) Educated Patient;Parent(s)   Methods Explanation;Demonstration;Tactile cues;Verbal cues   Comprehension Verbalized understanding;Returned demonstration;Verbal cues required;Tactile cues required;Need further instruction          PT Short Term Goals - 02/03/15 1235    PT SHORT TERM GOAL #1   Title Pt will improve BERG balance score to 56/56 to decrease fall risk and improve functional mobility.    Baseline 50/56 on 8/31   Time 4   Period Weeks   PT SHORT TERM GOAL #2   Title Pt will improve gait speed to 2.83 ft/sec to improve functional ambulation level to community ambulator.     Baseline 2.01 on 8/31   Time 4   Period Weeks   PT SHORT TERM GOAL #3   Title Pt will ambulate >500' on indoor and outdoor surfaces including grass, inclines and curb step without AD with AFO at S level without overt LOB to indicate safe negotiation of indoor and outdoor surfaces.   Baseline 4   Period Weeks   PT SHORT TERM GOAL #4   Title Pt will negotiate up/down 4 steps in step to pattern with single rail at mod I level with safe technique to traverse community obstacles.     Baseline min/guard to min A on 8/31   Time 4   Period Weeks   PT SHORT TERM GOAL #5   Title Pt will improve to 63' to improve ambulation function and functional endurance.    Baseline 441' on 8/31   Time 4   Period Weeks           PT Long Term Goals - 02/03/15 1256    PT LONG TERM GOAL #1   Title Pts SIS mobility score will improve to 80% to indicate improved perceived functional mobility.     Baseline 66.7% on 8/31   Time 8   Period Weeks   PT LONG TERM GOAL #2   Title Pt will improve ankle DF strength to 3/5 in order to increase safety with household ambulation and begin to address ambulation without use of AFO.    Baseline 1/5  on 8/31   Time 8   Period Weeks   PT LONG TERM GOAL #3   Title Pt will verbalize ability to return to leisure activities to indicate return to community function.     Time 8   Period Weeks   PT LONG TERM GOAL #4   Title Pt will negotiate up/down 4 steps without rail in alternating fashion to traverse community stairs while carrying object.    Time 8   Period Weeks   PT LONG TERM GOAL #5   Title Pt  will demonstrate ability to ambulate up to 150' with decreased R genu recurvatum to improve R knee stability/strength and prevent knee injury.     Baseline 8   Period Weeks   Additional Long Term Goals   Additional Long Term Goals Yes   PT LONG TERM GOAL #6   Title Pt will demonstrate ability to perform SLS x 5 secs on RLE to improve balance and awareness of RLE.    Time 8   Period Weeks               Plan - 02/09/15 1610    Clinical Impression Statement Skilled session focused on HEP for [facility] stretch, quad and hamstring strengthening, see pt instruction for details.  Also performed NMR with gait without AD, TKE and with stair negotiation.   Tolerated all well with improvement in task with knee extension, however needs re-enforcement to carry over to gait.     Pt will benefit from skilled therapeutic intervention in order to improve on the following deficits Abnormal gait;Decreased balance;Decreased coordination;Decreased knowledge of precautions;Decreased safety awareness;Decreased strength;Difficulty walking;Impaired perceived functional ability;Impaired sensation;Impaired flexibility;Impaired tone;Impaired UE functional use;Postural dysfunction;Decreased knowledge of use of DME   Rehab Potential Excellent   Clinical Impairments Affecting Rehab Potential adhering to safety precautions   PT Frequency 2x / week   PT Duration 8 weeks   PT Treatment/Interventions ADLs/Self Care Home Management;Electrical Stimulation;Biofeedback;DME Instruction;Gait training;Stair training;Functional  mobility training;Therapeutic exercise;Balance training;Neuromuscular re-education;Patient/family education;Manual techniques;Visual/perceptual remediation/compensation   PT Next Visit Plan R knee stability (quad and hamstring strength-may benefit from NMES), stretching HC and ankle inverters, PNF patterns, isolated hip/knee flex, R LE WB   PT Home Exercise Plan see pt instruction   Consulted and Agree with Plan of Care Patient;Family member/caregiver   Family Member Consulted Father Hessie Knows)        Problem List Patient Active Problem List   Diagnosis Date Noted  . 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 Right hemiparesis 10/21/2014  . Aphasia due to stroke 10/21/2014    Harriet Butte, PT, MPT [facility] [address] [place], [place], [address] Phone: [phone]   Fax:  [phone] 02/09/2015, 9:06 AM

## 2015-02-09 NOTE — Patient Instructions (Signed)
Gastroc / Heel Cord Stretch - Seated With Towel   Sit on floor, towel around ball of foot. Gently pull foot in toward body, stretching heel cord and calf. Hold for 60__ seconds. Repeat on involved leg. Repeat __2_ times. Do _2__ times per day.  Dad will need to assist on outer part of belt to make sure that foot is straight.  Hamstring Curl: Resisted (Prone)   Anchor behind, with tubing on left ankle, leg straight, bend knee. Repeat _15__ times per set. Do _1___ sets per session. Do __2__ sessions per day. Really make sure that you are keeping your R hip on bed.  You do not have to use a band to do this yet.  Functional Quadriceps: Chair Squat   Keeping feet flat on floor, shoulder width apart, squat as low as is comfortable. Use support as necessary. Repeat _15___ times per set. Do _1___ sets per session. Do __2__ sessions per day.  Can use a wall length mirror to be able to look at your R knee to make sure its not locking out.  Make sure both legs are moving at the same time.  Make sure knees do not go over toes, so pretend you are sitting in a chair behind you.    http://orth.exer.us/736   Copyright  VHI. All rights reserved.   http://orth.exer.us/670

## 2015-02-09 NOTE — Therapy (Signed)
[facility] [address] [place], [place], [address] Phone: [phone]   Fax:  [phone]  Occupational Therapy Treatment  Patient Details  Name: Mario [name] MRN: [id number] Date of Birth: [birth year] Referring Provider:  Blane Ohara, MD  Encounter Date: 02/09/2015      OT End of Session - 02/09/15 1144    Visit Number 2   Number of Visits 16   Date for OT Re-Evaluation 04/01/15   Authorization Type [employer] state health PPO no visit limit   OT Start Time 1016   OT Stop Time 1102   OT Time Calculation (min) 46 min   Activity Tolerance Patient tolerated treatment well      Past Medical History  Diagnosis Date  . Hypertension     Past Surgical History  Procedure Laterality Date  . No past surgeries      There were no vitals filed for this visit.  Visit Diagnosis:  Spastic hemiplegia affecting right dominant side  Pain in joint, shoulder region, right  Muscle weakness  Impaired sensation  Impaired cognition      Subjective Assessment - 02/09/15 1023    Subjective  Reveiwed goals with pt and he nodded and said "yes" with thumbs up   Pertinent History See epic snaphshot, L MCA CVA, HTN   Patient Stated Goals Per father, speech, RUE and walking to improve   Currently in Pain? No/denies                      OT Treatments/Exercises (OP) - 02/09/15 1138    Neurological Re-education Exercises   Other Exercises 1 Reviwed goals with pt and father and provided written copy to dad with pt's permission.  Pt in agreement with goals as demonstrated by giving thumbs up and saying "yes"  Neuro re ed to address development of HEP for RUE with emphasis on decreasing flexor synergy influence in low reach (program in supine as well as sitting). Please see pt education section for details. Pt's father also educated and able to return demonstrate with pt. Instructions given in writing and dad also  videotaped program on phone. Pt requires min faciliation to over ride elbow flexion and pronation with low reach.                OT Education - 02/09/15 1142    Education provided Yes   Education Details HEP for UE;    Person(s) Educated Patient;Parent(s)   Methods Explanation;Demonstration;Tactile cues;Verbal cues;Handout;Other (comment)  dad also made videotape   Comprehension Verbalized understanding;Returned demonstration          OT Short Term Goals - 02/09/15 1142    OT SHORT TERM GOAL #1   Title Pt will  and family member will be mod I with HEP - 03/04/2015   Status On-going   OT SHORT TERM GOAL #2   Title Pt will demonstrate ability to complete shoulder flexion to 50* without eblow flexion in blilateral reach to work toward functional ow reach with RUE.   Status On-going   OT SHORT TERM GOAL #3   Title Pt will demonstrate understanding of AE for cutting and tying shoes   Status On-going   OT SHORT TERM GOAL #4   Title Pt will demonstrate beginning isolated grasp in R hand in prepartion for RUE use in functional tasks.   Status On-going   OT SHORT TERM GOAL #5   Title Pt will demonstrate ability to use RUE as  stabilizer at least 50% of the time during basic ADL tasks.   Status On-going           OT Long Term Goals - 02/09/15 1143    OT LONG TERM GOAL #1   Title Pt and family will be mod I with updated HEP - 04/01/2015   Status On-going   OT LONG TERM GOAL #2   Title Pt will demonstrate ability for alternating attention for familiar functional tasks (i.e. cooking task)   Status On-going   OT LONG TERM GOAL #3   Title Pt will demonstrate ability for low unilateral reach with min a  for hand grasp only during a functional task    Status On-going   OT LONG TERM GOAL #4   Title Pt will demonstrate ability to use RUE as gross assist for at least 50% of basic self care tasks.   Status On-going   OT LONG TERM GOAL #5   Title Pt will be able to grasp light  cylindrical object with no more than min a.   Status On-going   OT LONG TERM GOAL #6   Title Pt will be able to cast a fisihing pole with RUE with no more than mod a.   Status On-going   OT LONG TERM GOAL #7   Title Pt will be mod I with hot meal prep   Status On-going               Plan - 02/09/15 1143    Clinical Impression Statement Pt is making progress toward LTG's - pt given HEP and dad able to assist with program PRN.    Pt will benefit from skilled therapeutic intervention in order to improve on the following deficits (Retired) Abnormal gait;Decreased balance;Decreased coordination;Decreased cognition;Decreased knowledge of use of DME;Decreased mobility;Decreased range of motion;Decreased strength;Increased edema;Impaired UE functional use;Impaired tone;Impaired sensation;Pain   Rehab Potential Good   Clinical Impairments Affecting Rehab Potential global aphasia   OT Frequency 2x / week   OT Duration 8 weeks   OT Treatment/Interventions Self-care/ADL training;Ultrasound;Moist Heat;Electrical Stimulation;DME and/or AE instruction;Neuromuscular education;Therapeutic exercise;Functional Mobility Training;Manual Therapy;Passive range of motion;Splinting;Therapeutic activities;Balance training;Patient/family education;Cognitive remediation/compensation   Plan check HEP, NMR for low reach   Consulted and Agree with Plan of Care Patient;Family member/caregiver   Family Member Consulted father        Problem List Patient Active Problem List   Diagnosis Date Noted  . 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 Right hemiparesis 10/21/2014  . Aphasia due to stroke 10/21/2014    Norton Pastel, OTR/L 02/09/2015, 11:45 AM  [facility] [address] [place], [place], [address] Phone: [phone]   Fax:  [phone]

## 2015-02-10 ENCOUNTER — Ambulatory Visit: Payer: BC Managed Care – PPO | Admitting: Occupational Therapy

## 2015-02-10 NOTE — Patient Instructions (Signed)
Practice consonant vowel words for at least 15 minutes twice a day.

## 2015-02-10 NOTE — Therapy (Signed)
[facility] [address] [place], [place], [address] Phone: [phone]   Fax:  [phone]  Speech Language Pathology Treatment  Patient Details  Name: Mario [name] MRN: [id number] Date of Birth: [birth year] Referring Provider:  Blane Ohara, MD  Encounter Date: 02/09/2015      End of Session - 02/10/15 0820    Visit Number 2   Number of Visits 16   Date for SLP Re-Evaluation 05/05/15   SLP Start Time 0847   SLP Stop Time  0930   SLP Time Calculation (min) 43 min   Activity Tolerance Patient tolerated treatment well      Past Medical History  Diagnosis Date  . Hypertension     Past Surgical History  Procedure Laterality Date  . No past surgeries      There were no vitals filed for this visit.  Visit Diagnosis: Expressive aphasia  Receptive aphasia  Verbal apraxia      Subjective Assessment - 02/09/15 0851    Subjective Gibberish for "s"                ADULT SLP TREATMENT - 02/09/15 0900    General Information   Behavior/Cognition Alert;Cooperative;Pleasant mood   Treatment Provided   Treatment provided Cognitive-Linquistic   Pain Assessment   Pain Assessment No/denies pain   Cognitive-Linquistic Treatment   Treatment focused on Aphasia;Apraxia   Skilled Treatment 10/10 with receptive ID pictures by description (6-step sequence cards).  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 In simple consoant vowel words pt produced 75% success in simultaneous production with SLP. Pt had 20% success in imitation. Noted plosive/fricative substitution usually.   Assessment / Recommendations / Plan   Plan Continue with current plan of care   Progression Toward Goals   Progression toward goals Progressing toward goals            SLP Short Term Goals -  02/10/15 0824    SLP SHORT TERM GOAL #1   Title pt to perform rote speech tasks (simultaneous with SLP/family) with 25% success, functional responses allowed   Time 4   Period Weeks  or 8 visits - for all STGs   Status On-going   SLP SHORT TERM GOAL #2   Title pt will imitate consonant vowel syllables/words with written, picture, and other nonverbal cues, 60% success, functional responses allowed   Time 4   Period Weeks   Status On-going   SLP SHORT TERM GOAL #3   Title pt will functionally communicate with simple augmentative/alternative device (via pictures/texting/typing) for basic wants/needs 60% success, in viable opportunities   Time 4   Period Weeks   Status On-going   SLP SHORT TERM GOAL #4   Title pt will demo understanding of simple conversational questions via yes/no responses at 80% accuracy   Time 4   Period Weeks   Status On-going   SLP SHORT TERM GOAL #5   Title pt will write name and address with rare nonverbal cues and no signs aphasia   Time 4   Period Weeks   Status On-going          SLP Long Term Goals - 02/10/15 0825    SLP LONG TERM GOAL #1   Title pt will demo undertanding of simple/mod complex conversational questions via yes/no responses 80% accuracy   Time 8  Period Weeks  or 16 visits - for all LTGs   Status On-going   SLP LONG TERM GOAL #2   Title pt will demo undertanding of simple conversational quesitons via yes/no responses 90% accuracy   Time 8   Period Weeks   Status On-going   SLP LONG TERM GOAL #3   Title pt will perform rote speech tasks simultaneously with SLP with 40% accuracy (functional responses allowed)   Time 8   Period Weeks   Status On-going   SLP LONG TERM GOAL #4   Title pt will imitate salient words/family names with 20% success   Time 8   Period Weeks   Status On-going   SLP LONG TERM GOAL #5   Title pt will demo use of simple alternative/augmentative communcation device or multimodal communication with 70%  success in message conveyance   Time 8   Period Weeks   Status On-going          Plan - 02/10/15 8119    Clinical Impression Statement 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 Pt now has consonant vowel words to practice at home. Skilled ST remains necessary to maximize pt's language ability.   Speech Therapy Frequency 2x / week   Duration --  8 weeks or 15 visits   Treatment/Interventions SLP instruction and feedback;Compensatory strategies;Internal/external aids;Environmental controls;Patient/family education;Functional tasks;Cueing hierarchy;Multimodal communcation approach   Potential to Achieve Goals Fair   Potential Considerations Severity of impairments        Problem List Patient Active Problem List   Diagnosis Date Noted  . 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 Right hemiparesis 10/21/2014  . Aphasia due to stroke 10/21/2014    [facility] , MS, CCC-SLP  02/10/2015, 8:26 AM  [facility] [address] [place], [place], [address] Phone: [phone]   Fax:  [phone]

## 2015-02-11 ENCOUNTER — Ambulatory Visit: Payer: BC Managed Care – PPO | Admitting: Occupational Therapy

## 2015-02-11 ENCOUNTER — Encounter: Payer: Self-pay | Admitting: Occupational Therapy

## 2015-02-11 ENCOUNTER — Encounter: Payer: Self-pay | Admitting: Rehabilitation

## 2015-02-11 ENCOUNTER — Ambulatory Visit: Payer: BC Managed Care – PPO | Admitting: Rehabilitation

## 2015-02-11 DIAGNOSIS — I69359 Hemiplegia and hemiparesis following cerebral infarction affecting unspecified side: Secondary | ICD-10-CM

## 2015-02-11 DIAGNOSIS — R269 Unspecified abnormalities of gait and mobility: Secondary | ICD-10-CM

## 2015-02-11 DIAGNOSIS — IMO0002 Reserved for concepts with insufficient information to code with codable children: Secondary | ICD-10-CM

## 2015-02-11 DIAGNOSIS — R531 Weakness: Secondary | ICD-10-CM

## 2015-02-11 DIAGNOSIS — R4189 Other symptoms and signs involving cognitive functions and awareness: Secondary | ICD-10-CM

## 2015-02-11 DIAGNOSIS — G8111 Spastic hemiplegia affecting right dominant side: Secondary | ICD-10-CM

## 2015-02-11 DIAGNOSIS — R201 Hypoesthesia of skin: Secondary | ICD-10-CM

## 2015-02-11 DIAGNOSIS — M25511 Pain in right shoulder: Secondary | ICD-10-CM

## 2015-02-11 DIAGNOSIS — M6281 Muscle weakness (generalized): Secondary | ICD-10-CM

## 2015-02-11 DIAGNOSIS — R2681 Unsteadiness on feet: Secondary | ICD-10-CM

## 2015-02-11 NOTE — Therapy (Signed)
[facility] [address] [place], [place], [address] Phone: [phone]   Fax:  [phone]  Physical Therapy Treatment  Patient Details  Name: Mario [name] MRN: [id number] Date of Birth: [birth year] Referring Provider:  Blane Ohara, MD  Encounter Date: 02/11/2015      PT End of Session - 02/11/15 1627    Visit Number 3   Number of Visits 17   Date for PT Re-Evaluation 04/02/15   PT Start Time 1445   PT Stop Time 1530   PT Time Calculation (min) 45 min   Activity Tolerance Patient tolerated treatment well   Behavior During Therapy [facility] for tasks assessed/performed      Past Medical History  Diagnosis Date  . Hypertension     Past Surgical History  Procedure Laterality Date  . No past surgeries      There were no vitals filed for this visit.  Visit Diagnosis:  Abnormality of gait  Unsteadiness  Hemiparesis following cerebrovascular accident  Generalized weakness      Subjective Assessment - 02/11/15 1626    Subjective Limited subjective due to aphasia, however responds "no" to falls and "no" to any issues since last visit.     Limitations Walking;House hold activities;Reading;Standing;Writing   Patient Stated Goals Improve speech, be able to walk better, and get functional use of RUE.    Currently in Pain? No/denies                         [facility] Adult PT Treatment/Exercise - 02/11/15 1516    Transfers   Transfers Sit to Stand;Stand to Sit;Stand Pivot Transfers   Sit to Stand 6: Modified independent (Device/Increase time)   Stand to Sit 6: Modified independent (Device/Increase time)   Stand Pivot Transfers 6: Modified independent (Device/Increase time)   Comments Pt somewhat impulsive with mobility.    Ambulation/Gait   Ambulation/Gait Yes   Ambulation/Gait Assistance 4: Min assist  b/c without AFO for safety   Ambulation/Gait Assistance Details Provided approximation at R LE  during R stance phase of gait during short bouts of ambulation without AFO to ensure R ankle stability.    Ambulation Distance (Feet) 40 Feet  and another 63'    Assistive device None;Large base quad cane   Gait Pattern Decreased arm swing - right;Step-through pattern;Decreased stance time - right;Decreased stride length;Decreased dorsiflexion - right;Decreased weight shift to right;Right circumduction;Right genu recurvatum;Lateral hip instability;Poor foot clearance - right   Ambulation Surface Level;Indoor   Stairs --   Stairs Assistance --   Stair Management Technique --   Number of Stairs --   Height of Stairs --   Self-Care   Self-Care Other Self-Care Comments   Other Self-Care Comments  Reviewed compliance with HEP.  PT provided manual stretch of R HC x 2 reps of 60 secs.    Neuro Re-ed    Neuro Re-ed Details  Initiated session with PNF DF flexion pattern with tactile facilitation at foot for DF and resistance at knee for hip and knee flex x 2 sets of 10 reps.  Performed NMR for activation and stabilization of R knee (quad) and also glute med muscles with squated positions while tapping LLE to unstable targets, performing posterior lunge and lateral lunges x 10-15 reps each.  Progressed to transitioning from tall kneeling to L half kneeling to increase WB and activation of glute med in RLE as well as tall kneeling to R half kneeling while  performing dynamic reaching to activate trunk and pelvic rotation as well as stretch hip flex and activation hip external rotators.                  PT Education - 02/11/15 1625    Education provided Yes   Education Details Brief verbal review of HEP   Person(s) Educated Patient   Methods Explanation   Comprehension Verbalized understanding          PT Short Term Goals - 02/11/15 1633    PT SHORT TERM GOAL #1   Title Pt will improve BERG balance score to 56/56 to decrease fall risk and improve functional mobility. (Target 03/03/15)    Baseline 50/56 on 8/31   Time 4   Period Weeks   PT SHORT TERM GOAL #2   Title Pt will improve gait speed to 2.83 ft/sec to improve functional ambulation level to community ambulator.   (Target 03/03/15)   Baseline 2.01 on 8/31   Time 4   Period Weeks   PT SHORT TERM GOAL #3   Title Pt will ambulate >500' on indoor and outdoor surfaces including grass, inclines and curb step without AD with AFO at S level without overt LOB to indicate safe negotiation of indoor and outdoor surfaces.  (Target 03/03/15)   Baseline 4   Period Weeks   PT SHORT TERM GOAL #4   Title Pt will negotiate up/down 4 steps in step to pattern with single rail at mod I level with safe technique to traverse community obstacles.   (Target 03/03/15)   Baseline min/guard to min A on 8/31   Time 4   Period Weeks   PT SHORT TERM GOAL #5   Title Pt will improve to 498' to improve ambulation function and functional endurance.    Baseline 441' on 8/31   Time 4   Period Weeks           PT Long Term Goals - 02/11/15 1633    PT LONG TERM GOAL #1   Title Pts SIS mobility score will improve to 80% to indicate improved perceived functional mobility.  (Target 03/31/15)   Baseline 66.7% on 8/31   Time 8   Period Weeks   PT LONG TERM GOAL #2   Title Pt will improve ankle DF strength to 3/5 in order to increase safety with household ambulation and begin to address ambulation without use of AFO.  (Target 03/31/15)   Baseline 1/5 on 8/31   Time 8   Period Weeks   PT LONG TERM GOAL #3   Title Pt will verbalize ability to return to leisure activities to indicate return to community function.   (Target 03/31/15)   Time 8   Period Weeks   PT LONG TERM GOAL #4   Title Pt will negotiate up/down 4 steps without rail in alternating fashion to traverse community stairs while carrying object.  (Target 03/31/15)   Time 8   Period Weeks   PT LONG TERM GOAL #5   Title Pt will demonstrate ability to ambulate up to 150' with  decreased R genu recurvatum to improve R knee stability/strength and prevent knee injury.     Baseline 8   Period Weeks   PT LONG TERM GOAL #6   Title Pt will demonstrate ability to perform SLS x 5 secs on RLE to improve balance and awareness of RLE.    Time 8   Period Weeks  Plan - 02/11/15 1628    Clinical Impression Statement Skilled session focused on stretching and PNF to address tone prior to mobility, stabilization and activation of R knee, and closed chain WB tasks to increase glute med activation in RLE as well as increasing ROM in trunk and pelvis with R half kneeling.     Pt will benefit from skilled therapeutic intervention in order to improve on the following deficits Abnormal gait;Decreased balance;Decreased coordination;Decreased knowledge of precautions;Decreased safety awareness;Decreased strength;Difficulty walking;Impaired perceived functional ability;Impaired sensation;Impaired flexibility;Impaired tone;Impaired UE functional use;Postural dysfunction;Decreased knowledge of use of DME   Rehab Potential Excellent   Clinical Impairments Affecting Rehab Potential adhering to safety precautions   PT Frequency 2x / week   PT Duration 8 weeks   PT Treatment/Interventions ADLs/Self Care Home Management;Electrical Stimulation;Biofeedback;DME Instruction;Gait training;Stair training;Functional mobility training;Therapeutic exercise;Balance training;Neuromuscular re-education;Patient/family education;Manual techniques;Visual/perceptual remediation/compensation   PT Next Visit Plan R knee stability (without AFO), stretching HC (ensure that father assisting with this at home), PNF to decrease tone, isolated hip/knee flex, RLE WB   Consulted and Agree with Plan of Care Patient        Problem List Patient Active Problem List   Diagnosis Date Noted  . 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  Right hemiparesis 10/21/2014  . Aphasia due to stroke 10/21/2014   Harriet Butte, PT, MPT [facility] [address] [place], [place], [address] Phone: [phone]   Fax:  [phone] 02/11/2015, 4:36 PM

## 2015-02-11 NOTE — Therapy (Signed)
[facility] [address] [place], [place], [address] Phone: [phone]   Fax:  [phone]  Occupational Therapy Treatment  Patient Details  Name: Mario [name] MRN: [id number] Date of Birth: 1976-05-03 Referring Provider:  Blane Ohara, MD  Encounter Date: 02/11/2015      OT End of Session - 02/11/15 1710    Visit Number 3   Number of Visits 16   Date for OT Re-Evaluation 04/01/15   Authorization Type [employer] state health PPO no visit limit   OT Start Time 1532   OT Stop Time 1615   OT Time Calculation (min) 43 min   Activity Tolerance Patient tolerated treatment well      Past Medical History  Diagnosis Date  . Hypertension     Past Surgical History  Procedure Laterality Date  . No past surgeries      There were no vitals filed for this visit.  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 No/denies                      OT Treatments/Exercises (OP) - 02/11/15 1651    Neurological Re-education Exercises   Other Exercises 1 Neuro re ed to address low reach with UE ranger with mod facilitaiton and cues to reach with hand vs shoulder.  Pt with evident apraxia when attempting to move RUE.  Pt improved with closed chain reach activities with repetiion and practice. Also addressed mid reach with mod faciltiation and resistance built in as well to improve proximal stability as well as stable mobility for proximal control.  Progressed to bilaterally holding and  placing of object to and from low surface. Initially needed mod facilitation but with repetition progressed to min facilitation.                  OT Short Term Goals - 02/11/15 1708    OT SHORT TERM GOAL #1   Title Pt will  and family member will be mod I with HEP - 03/04/2015   Status On-going   OT SHORT TERM GOAL #2   Title Pt will demonstrate ability to complete shoulder flexion to 50* without eblow flexion in blilateral reach to work toward functional ow reach with RUE.   Status On-going   OT SHORT TERM GOAL #3   Title Pt will demonstrate understanding of AE for cutting and tying shoes   Status On-going   OT SHORT TERM GOAL #4   Title Pt will demonstrate beginning isolated grasp in R hand in prepartion for RUE use in functional tasks.   Status On-going   OT SHORT TERM GOAL #5   Title Pt will demonstrate ability to use RUE as stabilizer at least 50% of the time during basic ADL tasks.   Status On-going           OT Long Term Goals - 02/11/15 1709    OT LONG TERM GOAL #1   Title Pt and family will be mod I  with updated HEP - 04/01/2015   Status On-going   OT LONG TERM GOAL #2   Title Pt will demonstrate ability for alternating attention for familiar functional tasks (i.e. cooking task)   Status On-going   OT LONG TERM GOAL #3   Title Pt will demonstrate ability for low unilateral reach with min a  for hand grasp only during a functional task    Status On-going   OT LONG TERM GOAL #4   Title Pt will demonstrate ability to use RUE as gross assist for at least 50% of basic self care tasks.   Status On-going   OT LONG TERM GOAL #5   Title Pt will be able to grasp light cylindrical object with no more than min a.   Status On-going   OT LONG TERM GOAL #6   Title Pt will be able to cast a fisihing pole with RUE with no more than mod a.   Status On-going   OT LONG TERM GOAL #7   Title Pt will be mod I with hot meal prep   Status On-going               Plan  - 02/11/15 1709    Clinical Impression Statement Pt progressing nicely toward STG's - pt however with evident apraxia during activities today. Pt is compliant with HEP.   Pt will benefit from skilled therapeutic intervention in order to improve on the following deficits (Retired) Abnormal gait;Decreased balance;Decreased coordination;Decreased cognition;Decreased knowledge of use of DME;Decreased mobility;Decreased range of motion;Decreased strength;Increased edema;Impaired UE functional use;Impaired tone;Impaired sensation;Pain   Rehab Potential Good   Clinical Impairments Affecting Rehab Potential global aphasia, apraxia   OT Frequency 2x / week   OT Duration 8 weeks   OT Treatment/Interventions Self-care/ADL training;Ultrasound;Moist Heat;Electrical Stimulation;DME and/or AE instruction;Neuromuscular education;Therapeutic exercise;Functional Mobility Training;Manual Therapy;Passive range of motion;Splinting;Therapeutic activities;Balance training;Patient/family education;Cognitive remediation/compensation   Plan NMR low reach, holding, possibly begin grasp   Consulted and Agree with Plan of Care Patient        Problem List Patient Active Problem List   Diagnosis Date Noted  . 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 Right hemiparesis 10/21/2014  . Aphasia due to stroke 10/21/2014    Norton Pastel, OTR/L 02/11/2015, 5:12 PM  [facility] [address] [place], [place], [address] Phone: [phone]   Fax:  [phone]

## 2015-02-15 ENCOUNTER — Ambulatory Visit: Payer: BC Managed Care – PPO | Admitting: Occupational Therapy

## 2015-02-15 ENCOUNTER — Ambulatory Visit: Payer: BC Managed Care – PPO | Admitting: Rehabilitation

## 2015-02-17 ENCOUNTER — Ambulatory Visit: Payer: BC Managed Care – PPO | Admitting: Occupational Therapy

## 2015-02-17 ENCOUNTER — Ambulatory Visit: Payer: BC Managed Care – PPO | Admitting: Rehabilitative and Restorative Service Providers"

## 2015-02-17 ENCOUNTER — Encounter: Payer: Self-pay | Admitting: Occupational Therapy

## 2015-02-17 ENCOUNTER — Ambulatory Visit: Payer: BC Managed Care – PPO

## 2015-02-17 DIAGNOSIS — R4701 Aphasia: Secondary | ICD-10-CM

## 2015-02-17 DIAGNOSIS — G8111 Spastic hemiplegia affecting right dominant side: Secondary | ICD-10-CM

## 2015-02-17 DIAGNOSIS — R531 Weakness: Secondary | ICD-10-CM

## 2015-02-17 DIAGNOSIS — IMO0002 Reserved for concepts with insufficient information to code with codable children: Secondary | ICD-10-CM

## 2015-02-17 DIAGNOSIS — R482 Apraxia: Secondary | ICD-10-CM

## 2015-02-17 DIAGNOSIS — R269 Unspecified abnormalities of gait and mobility: Secondary | ICD-10-CM

## 2015-02-17 NOTE — Therapy (Signed)
[facility] [address] [place], [place], [address] Phone: [phone]   Fax:  [phone]  Speech Language Pathology Treatment  Patient Details  Name: Mario [name] MRN: [id number] Date of Birth: [birth year] Referring Provider:  Blane Ohara, MD  Encounter Date: 02/17/2015      End of Session - 02/17/15 1042    Visit Number 3   Number of Visits 16   Date for SLP Re-Evaluation 05/05/15   SLP Start Time 0935   SLP Stop Time  1017   SLP Time Calculation (min) 42 min   Activity Tolerance Patient tolerated treatment well      Past Medical History  Diagnosis Date  . Hypertension     Past Surgical History  Procedure Laterality Date  . No past surgeries      There were no vitals filed for this visit.  Visit Diagnosis: Expressive aphasia  Receptive aphasia  Verbal apraxia      Subjective Assessment - 02/17/15 0941    Subjective "hey!" upon SLP greeting. Pt indicated he had been practicing simple consonant vowel words.               ADULT SLP TREATMENT - 02/17/15 0940    General Information   Behavior/Cognition Alert;Cooperative;Pleasant mood   Treatment Provided   Treatment provided Cognitive-Linquistic   Pain Assessment   Pain Assessment No/denies pain   Cognitive-Linquistic Treatment   Treatment focused on Apraxia;Aphasia   Skilled Treatment SLP facilitated simple functional question answering (verbally) using "me" and "you". 70% success verbalizing with initial  consistent visual cue from SLP.  Without SLP cues, pt was successful < 10% of the time with those simple words. To facilitate simple spontaneous speech, SLP practiced /p/ phoneme without voicing. Max A req'd consistently (visual, verbal, tactile, and demonstration cues).   Assessment / Recommendations / Plan   Plan Continue with current plan of care   Progression Toward Goals   Progression toward goals Progressing toward goals           SLP Education - 02/17/15 1042    Education provided Yes   Education Details /p/    Person(s) Educated Patient   Methods Explanation;Demonstration;Verbal cues;Tactile cues;Handout   Comprehension Verbalized understanding          SLP Short Term Goals - 02/10/15 1324    SLP SHORT TERM GOAL #1   Title pt to perform rote speech tasks (simultaneous with SLP/family) with 25% success, functional responses allowed   Time 4   Period Weeks  or 8 visits - for all STGs   Status On-going   SLP SHORT TERM GOAL #2   Title pt will imitate consonant vowel syllables/words with written, picture, and other nonverbal cues, 60% success, functional responses allowed   Time 4   Period Weeks   Status On-going   SLP SHORT TERM GOAL #3   Title pt will functionally communicate with simple augmentative/alternative device (via pictures/texting/typing) for basic wants/needs 60% success, in viable opportunities   Time 4   Period Weeks   Status On-going   SLP SHORT TERM GOAL #4   Title pt will demo understanding of simple conversational questions via yes/no responses at 80% accuracy   Time 4   Period Weeks   Status On-going   SLP SHORT TERM GOAL #5   Title pt will write name and address with rare nonverbal cues and no signs aphasia   Time 4   Period Weeks   Status On-going  SLP Long Term Goals - 02/10/15 0825    SLP LONG TERM GOAL #1   Title pt will demo undertanding of simple/mod complex conversational questions via yes/no responses 80% accuracy   Time 8   Period Weeks  or 16 visits - for all LTGs   Status On-going   SLP LONG TERM GOAL #2   Title pt will demo undertanding of simple conversational quesitons via yes/no responses 90% accuracy   Time 8   Period Weeks   Status On-going   SLP LONG TERM GOAL #3   Title pt will perform rote speech tasks simultaneously with SLP with 40% accuracy (functional responses allowed)   Time 8   Period Weeks   Status On-going   SLP LONG  TERM GOAL #4   Title pt will imitate salient words/family names with 20% success   Time 8   Period Weeks   Status On-going   SLP LONG TERM GOAL #5   Title pt will demo use of simple alternative/augmentative communcation device or multimodal communication with 70% success in message conveyance   Time 8   Period Weeks   Status On-going          Plan - 02/17/15 1043    Clinical Impression Statement 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 Skilled ST remains necessary to maximize pt's language ability.   Speech Therapy Frequency 2x / week   Duration --  7 weeks or 14 sessions        Problem List Patient Active Problem List   Diagnosis Date Noted  . 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 Right hemiparesis 10/21/2014  . Aphasia due to stroke 10/21/2014    [facility] , MS, CCC-SLP  02/17/2015, 10:45 AM  [facility] [address] [place], [place], [address] Phone: [phone]   Fax:  [phone]

## 2015-02-17 NOTE — Therapy (Signed)
[facility] [address] [place], [place], [address] Phone: [phone]   Fax:  [phone]  Occupational Therapy Treatment  Patient Details  Name: Mario [name] MRN: [id number] Date of Birth: February 07, 1976 Referring Provider:  Blane Ohara, MD  Encounter Date: 02/17/2015      OT End of Session - 02/17/15 1124    Visit Number 4   Number of Visits 16   Date for OT Re-Evaluation 04/01/15   Authorization Type [employer]BS state health PPO no visit limit   OT Start Time 1015   OT Stop Time 1103   OT Time Calculation (min) 48 min   Activity Tolerance Patient tolerated treatment well   Behavior During Therapy [facility] for tasks assessed/performed      Past Medical History  Diagnosis Date  . Hypertension     Past Surgical History  Procedure Laterality Date  . No past surgeries      There were no vitals filed for this visit.  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     Pertinent History See epic snaphshot, L MCA CVA, HTN   Patient Stated Goals Per father, speech, RUE and walking to improve   Currently in Pain? No/denies   Pain Score 0-No pain                      OT Treatments/Exercises (OP) - 02/17/15 0001    Neurological Re-education Exercises   Other Exercises 1 Neuromuscular reeducation to address shoulder flexion with elbow extension for forward reaching pattern.  Patient required cueing to use forward flexion of humerus versus elevation, and to maximize elbow extension during reach pattern.  After facilitation / guiding, patient able to carryover this motor pattern this session.     Other Exercises 2 Began to address grasp, release, and orienting hand with shoulder, elbow, forearm and wrist.  Hand over hand guiding, with  cueing and facilitation to encourage more distal activation for more fine motor requirements.     Other Weight-Bearing Exercises 1 seated, long arm weight bearing with emphasis on grounding hand by activating right arm into surface, and weight shifting toward this arm, slowly increased demand as patient able to access more elbow extension to support self.     Other Weight-Bearing Exercises 2 Standing weight bearing on bilateral UE's with emphasis on weight shift toward right side to palce increased demand on right arm, weight shifting body forward and backward in standing, with subtle increase in demand.  Quadruped to address weight bearing through more challengin ranges of shoulder flexion.  Patient needed cueing to maintain active right arm, and to manage body mass toward right side.     Functional Reaching Activities   Low Level Following weight bearing addressed low and mid level reach with guided patterns initially.  Facilitation required to reinforce concept of "hand leads activity" as instructed in prior sessions.     Mid Level Utilized low to mid level reaching under functional conditions, reaching forward while grasping cup to fill with water, pour out water using radial and ulnar wrist deviation and hand over hand assist.  Opening low cabinet doors 9Handles) opening door with latch versus knob in clinic.  Maintainign right hand on door latch and pushing door open from stable base - ie. right arm  abduction modified closed chain.                OT Education - 02/17/15 1124    Education provided Yes   Education Details Hand and not shoulder leads reaching activity   Person(s) Educated Patient   Methods Explanation;Demonstration;Tactile cues;Verbal cues   Comprehension Tactile cues required          OT Short Term Goals - 02/11/15 1708    OT SHORT TERM GOAL #1   Title Pt will  and family member will be mod I with HEP - 03/04/2015   Status On-going   OT SHORT TERM GOAL #2   Title  Pt will demonstrate ability to complete shoulder flexion to 50* without eblow flexion in blilateral reach to work toward functional ow reach with RUE.   Status On-going   OT SHORT TERM GOAL #3   Title Pt will demonstrate understanding of AE for cutting and tying shoes   Status On-going   OT SHORT TERM GOAL #4   Title Pt will demonstrate beginning isolated grasp in R hand in prepartion for RUE use in functional tasks.   Status On-going   OT SHORT TERM GOAL #5   Title Pt will demonstrate ability to use RUE as stabilizer at least 50% of the time during basic ADL tasks.   Status On-going           OT Long Term Goals - 02/11/15 1709    OT LONG TERM GOAL #1   Title Pt and family will be mod I with updated HEP - 04/01/2015   Status On-going   OT LONG TERM GOAL #2   Title Pt will demonstrate ability for alternating attention for familiar functional tasks (i.e. cooking task)   Status On-going   OT LONG TERM GOAL #3   Title Pt will demonstrate ability for low unilateral reach with min a  for hand grasp only during a functional task    Status On-going   OT LONG TERM GOAL #4   Title Pt will demonstrate ability to use RUE as gross assist for at least 50% of basic self care tasks.   Status On-going   OT LONG TERM GOAL #5   Title Pt will be able to grasp light cylindrical object with no more than min a.   Status On-going   OT LONG TERM GOAL #6   Title Pt will be able to cast a fisihing pole with RUE with no more than mod a.   Status On-going   OT LONG TERM GOAL #7   Title Pt will be mod I with hot meal prep   Status On-going               Plan - 02/17/15 1125    Clinical Impression Statement Patient progressing toward goals, patient will benefit from repetition of motor patterns as he has active movement throughout right UE, yet patterns are becoming synergistic and patient has apraxia.    Pt will benefit from skilled therapeutic intervention in order to improve on the following  deficits (Retired) Abnormal gait;Decreased balance;Decreased coordination;Decreased cognition;Decreased knowledge of use of DME;Decreased mobility;Decreased range of motion;Decreased strength;Increased edema;Impaired UE functional use;Impaired tone;Impaired sensation;Pain   Rehab Potential Good   Clinical Impairments Affecting Rehab Potential global aphasia, apraxia   OT Frequency 2x / week   OT Duration 8 weeks   OT Treatment/Interventions Self-care/ADL training;Ultrasound;Moist Heat;Electrical Stimulation;DME and/or AE instruction;Neuromuscular education;Therapeutic exercise;Functional Mobility Training;Manual Therapy;Passive range of motion;Splinting;Therapeutic activities;Balance training;Patient/family education;Cognitive remediation/compensation  Plan Neuromuscular reeducation, weight bearing through right UE -low to mid reach, grasp / release, guided functional movement   Consulted and Agree with Plan of Care Patient        Problem List Patient Active Problem List   Diagnosis Date Noted  . 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 Right hemiparesis 10/21/2014  . Aphasia due to stroke 10/21/2014    Collier Salina, OTR/L 02/17/2015, 11:29 AM  [facility] [address] [place], [place], [address] Phone: [phone]   Fax:  [phone]

## 2015-02-17 NOTE — Therapy (Signed)
[facility] [address] [place], [place], [address] Phone: [phone]   Fax:  [phone]  Physical Therapy Treatment  Patient Details  Name: Mario [name] MRN: [id number] Date of Birth: Jan 30, 1976 Referring Provider:  Blane Ohara, MD  Encounter Date: 02/17/2015      PT End of Session - 02/17/15 2220    Visit Number 4   Number of Visits 17   Date for PT Re-Evaluation 04/02/15   PT Start Time 0845   PT Stop Time 0932   PT Time Calculation (min) 47 min   Activity Tolerance Patient tolerated treatment well   Behavior During Therapy [facility] for tasks assessed/performed      Past Medical History  Diagnosis Date  . Hypertension     Past Surgical History  Procedure Laterality Date  . No past surgeries      There were no vitals filed for this visit.  Visit Diagnosis:  Abnormality of gait  Generalized weakness      Subjective Assessment - 02/17/15 2217    Subjective Patient's father inquires at end of session about AFO fit.  PT to assess next visit.  Patient without subjective reports due to aphasia.   Patient Stated Goals Improve speech, be able to walk better, and get functional use of RUE.    Currently in Pain? No/denies        NEUROMUSCULAR RE-EDUCATION: R leg stance activities with UE weight bearing over physioball on elevated mat table with min A for facilitation of R knee and hip positioning/control moving L LE onto/off of compliant surfaces.  Then performing weight bearing through elbows with trunk cues and R/L hip extension alternating LE kicks with min A. R stance control activities focusing on knee positioning with mini squats Bilateral heel raises with visual cues from mirror and facilitation of R knee control and positioning x 10 reps Stance with R foot posterior emphasizing terminal R LE stance to knee flexion at initial swing phase with cues on hip/knee/ankle positioning and repetition for  motor learning with visual cues *no AFO Swing phase R LE with emphasis on knee flexion into extension prior to heel strike Physioball seated with L LE on compliant surfaces balancing on R LE  Gait: Ambulation without AFO with tactile cues to lengthen R trunk musculature x 230 feet with hip initiation tactile cues and CGA for safety x 2 reps  THERAPEUTIC EXERCISE: Hamstring stretch seated R and L sides Supine R LE knee flexed to chest x 5 reps rolling physioball Bridges with marching x 5 reps R and L sides Hamstring strengthening on stool with heel pulls with CGA for safety x 40 feet       PT Short Term Goals - 02/11/15 1633    PT SHORT TERM GOAL #1   Title Pt will improve BERG balance score to 56/56 to decrease fall risk and improve functional mobility. (Target 03/03/15)   Baseline 50/56 on 8/31   Time 4   Period Weeks   PT SHORT TERM GOAL #2   Title Pt will improve gait speed to 2.83 ft/sec to improve functional ambulation level to community ambulator.   (Target 03/03/15)   Baseline 2.01 on 8/31   Time 4   Period Weeks   PT SHORT TERM GOAL #3   Title Pt will ambulate >500' on indoor and outdoor surfaces including grass, inclines and curb step without AD with AFO at S level without overt LOB to indicate safe negotiation of indoor and  outdoor surfaces.  (Target 03/03/15)   Baseline 4   Period Weeks   PT SHORT TERM GOAL #4   Title Pt will negotiate up/down 4 steps in step to pattern with single rail at mod I level with safe technique to traverse community obstacles.   (Target 03/03/15)   Baseline min/guard to min A on 8/31   Time 4   Period Weeks   PT SHORT TERM GOAL #5   Title Pt will improve to 498' to improve ambulation function and functional endurance.    Baseline 441' on 8/31   Time 4   Period Weeks           PT Long Term Goals - 02/11/15 1633    PT LONG TERM GOAL #1   Title Pts SIS mobility score will improve to 80% to indicate improved perceived functional  mobility.  (Target 03/31/15)   Baseline 66.7% on 8/31   Time 8   Period Weeks   PT LONG TERM GOAL #2   Title Pt will improve ankle DF strength to 3/5 in order to increase safety with household ambulation and begin to address ambulation without use of AFO.  (Target 03/31/15)   Baseline 1/5 on 8/31   Time 8   Period Weeks   PT LONG TERM GOAL #3   Title Pt will verbalize ability to return to leisure activities to indicate return to community function.   (Target 03/31/15)   Time 8   Period Weeks   PT LONG TERM GOAL #4   Title Pt will negotiate up/down 4 steps without rail in alternating fashion to traverse community stairs while carrying object.  (Target 03/31/15)   Time 8   Period Weeks   PT LONG TERM GOAL #5   Title Pt will demonstrate ability to ambulate up to 150' with decreased R genu recurvatum to improve R knee stability/strength and prevent knee injury.     Baseline 8   Period Weeks   PT LONG TERM GOAL #6   Title Pt will demonstrate ability to perform SLS x 5 secs on RLE to improve balance and awareness of RLE.    Time 8   Period Weeks         Plan - 02/17/15 2235    Clinical Impression Statement PT treated patient without AFO today with guarding to ensure R foot clearance in PT.  Continue emphasizing R LE motor control and strengthening to improve gait mechanics and function.   PT Next Visit Plan R knee stability (without AFO), stretching HC (ensure that father assisting with this at home), PNF to decrease tone, isolated hip/knee flex, RLE WB  CHECK AFO NEXT SESSION   Consulted and Agree with Plan of Care Patient        Problem List Patient Active Problem List   Diagnosis Date Noted  . 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 Right hemiparesis 10/21/2014  . Aphasia due to stroke 10/21/2014    Jaggar Benko, PT 02/17/2015, 10:38 PM  [facility] [address] [place], [place], [address] Phone: [phone]   Fax:  [phone]

## 2015-02-17 NOTE — Patient Instructions (Signed)
Practice the "p" sound - whisper with hard lip close 5 sets of 10, twice a day  Practice the other words

## 2015-02-19 ENCOUNTER — Ambulatory Visit: Payer: BC Managed Care – PPO | Admitting: Rehabilitation

## 2015-02-19 ENCOUNTER — Encounter: Payer: Self-pay | Admitting: Occupational Therapy

## 2015-02-19 ENCOUNTER — Encounter: Payer: Self-pay | Admitting: Rehabilitation

## 2015-02-19 ENCOUNTER — Ambulatory Visit: Payer: BC Managed Care – PPO

## 2015-02-19 ENCOUNTER — Ambulatory Visit: Payer: BC Managed Care – PPO | Admitting: Occupational Therapy

## 2015-02-19 DIAGNOSIS — R4701 Aphasia: Secondary | ICD-10-CM

## 2015-02-19 DIAGNOSIS — R531 Weakness: Secondary | ICD-10-CM

## 2015-02-19 DIAGNOSIS — R482 Apraxia: Secondary | ICD-10-CM

## 2015-02-19 DIAGNOSIS — IMO0002 Reserved for concepts with insufficient information to code with codable children: Secondary | ICD-10-CM

## 2015-02-19 DIAGNOSIS — G8111 Spastic hemiplegia affecting right dominant side: Secondary | ICD-10-CM | POA: Diagnosis not present

## 2015-02-19 DIAGNOSIS — R2681 Unsteadiness on feet: Secondary | ICD-10-CM

## 2015-02-19 DIAGNOSIS — I69359 Hemiplegia and hemiparesis following cerebral infarction affecting unspecified side: Secondary | ICD-10-CM

## 2015-02-19 DIAGNOSIS — R269 Unspecified abnormalities of gait and mobility: Secondary | ICD-10-CM

## 2015-02-19 DIAGNOSIS — R201 Hypoesthesia of skin: Secondary | ICD-10-CM

## 2015-02-19 NOTE — Therapy (Signed)
[facility] [address] [place], [place], [address] Phone: [phone]   Fax:  [phone]  Speech Language Pathology Treatment  Patient Details  Name: Mario [name] MRN: [id number] Date of Birth: 06-Sep-1975 Referring Provider:  Blane Ohara, MD  Encounter Date: 02/19/2015      End of Session - 02/19/15 1444    Visit Number 4   Number of Visits 16   Date for SLP Re-Evaluation 05/05/15   SLP Start Time 1403   SLP Stop Time  1445   SLP Time Calculation (min) 42 min   Activity Tolerance Patient tolerated treatment well      Past Medical History  Diagnosis Date  . Hypertension     Past Surgical History  Procedure Laterality Date  . No past surgeries      There were no vitals filed for this visit.  Visit Diagnosis: Verbal apraxia  Expressive aphasia  Receptive aphasia      Subjective Assessment - 02/19/15 1405    Subjective "Hey!" Answered "yes" to question about meds.               ADULT SLP TREATMENT - 02/19/15 1405    General Information   Behavior/Cognition Alert;Cooperative;Pleasant mood   Treatment Provided   Treatment provided Cognitive-Linquistic   Pain Assessment   Pain Assessment No/denies pain   Cognitive-Linquistic Treatment   Treatment focused on Apraxia;Aphasia   Skilled Treatment SLP engaged pt with non-contextual 2-sentence selections for auditory comprehension. Pt answered questions 70% success; 100% with occasional min A. In expression tasks, pt prduced /p/phoneme in isolation with 87% success in sets 10 reps. Separated /p/ + vowel  words 5/10 simultaneous with SLP. <5% success with singing star spangled banner and happy birthday.   Assessment / Recommendations / Plan   Plan Continue with current plan of care   Progression Toward Goals   Progression toward goals Progressing toward goals            SLP Short Term Goals - 02/19/15 1445    SLP SHORT TERM GOAL #1   Title pt to perform rote speech tasks (simultaneous with SLP/family) with 25% success, functional responses allowed   Time 4   Period Weeks  or 8 visits - for all STGs   Status On-going   SLP SHORT TERM GOAL #2   Title pt will imitate consonant vowel syllables/words with written, picture, and other nonverbal cues, 60% success, functional responses allowed   Time 4   Period Weeks   Status On-going   SLP SHORT TERM GOAL #3   Title pt will functionally communicate with simple augmentative/alternative device (via pictures/texting/typing) for basic wants/needs 60% success, in viable opportunities   Time 4   Period Weeks   Status On-going   SLP SHORT TERM GOAL #4   Title pt will demo understanding of simple conversational questions via yes/no responses at 80% accuracy   Time 4   Period Weeks   Status On-going   SLP SHORT TERM GOAL #5   Title pt will write name and address with rare nonverbal cues and no signs aphasia   Time 4   Period Weeks   Status On-going          SLP Long Term Goals - 02/19/15 1445    SLP LONG TERM GOAL #1   Title pt will demo undertanding of simple/mod complex conversational questions via yes/no responses 80% accuracy   Time 8   Period Weeks  or 16 visits -  for all LTGs   Status On-going   SLP LONG TERM GOAL #2   Title pt will demo undertanding of simple conversational quesitons via yes/no responses 90% accuracy   Time 8   Period Weeks   Status On-going   SLP LONG TERM GOAL #3   Title pt will perform rote speech tasks simultaneously with SLP with 40% accuracy (functional responses allowed)   Time 8   Period Weeks   Status On-going   SLP LONG TERM GOAL #4   Title pt will imitate salient words/family names with 20% success   Time 8   Period Weeks   Status On-going   SLP LONG TERM GOAL #5   Title pt will demo use of simple alternative/augmentative communcation device or multimodal communication with 70% success in message conveyance   Time 8    Period Weeks   Status On-going          Plan - 02/19/15 1444    Clinical Impression Statement 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 Skilled ST remains necessary to maximize pt's language ability.   Speech Therapy Frequency 2x / week   Duration --  7 weeks or 13 sessions   Treatment/Interventions SLP instruction and feedback;Compensatory strategies;Internal/external aids;Environmental controls;Patient/family education;Functional tasks;Cueing hierarchy;Multimodal communcation approach   Potential to Achieve Goals Fair   Potential Considerations Severity of impairments        Problem List Patient Active Problem List   Diagnosis Date Noted  . 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 Right hemiparesis 10/21/2014  . 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 , MS, CCC-SLP  02/19/2015, 2:46 PM  [facility] [address] [place], [place], [address] Phone: [phone]   Fax:  [phone]

## 2015-02-19 NOTE — Therapy (Signed)
[facility] [address] [place], [place], [address] Phone: [phone]   Fax:  [phone]  Occupational Therapy Treatment  Patient Details  Name: Mario [name] MRN: [id number] Date of Birth: Oct 29, 1975 Referring Provider:  Blane Ohara, MD  Encounter Date: 02/19/2015      OT End of Session - 02/19/15 1743    Visit Number 5   Number of Visits 16   Date for OT Re-Evaluation 04/01/15   Authorization Type [employer] state health PPO no visit limit   OT Start Time 1445   OT Stop Time 1530   OT Time Calculation (min) 45 min   Activity Tolerance Patient tolerated treatment well   Behavior During Therapy [facility] for tasks assessed/performed      Past Medical History  Diagnosis Date  . Hypertension     Past Surgical History  Procedure Laterality Date  . No past surgeries      There were no vitals filed for this visit.  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   Pertinent History See epic snaphshot, L MCA CVA, HTN   Patient Stated Goals Per father, speech, RUE and walking to improve   Currently in Pain? No/denies   Pain Score 0-No pain                      OT Treatments/Exercises (OP) - 02/19/15 1732    ADLs   LB Dressing Worked to begin to attempt to incorporate right hand into functional activity.  Patient with lateral pinch present in right hand.  Encouraged patient to use lateral pinch in right hand to aide with tying shoes.  Patient needed facilitation initially to shape hand to task, but with cueing and guiding, he could begin to pinch shoe lace to pull lace tight.  Patient could also shape hand to base of shoe to push shoe off foot.  Patient has developed a strong left hand preference, and needs encouragement and guiding to  return to bilateral tasks.     Neurological Re-education Exercises   Other Exercises 1 Neuromuscular reeducation following weight bearing to address reach patterns, shoulder flexion with elbow extension, grasp, and release of various sized objects.  Patient needs greater faciltiation and cueing for active extension throughout right upper extremity.     Other Weight-Bearing Exercises 1 Worked to improve right arm activation via demand in closed chain positions, e.g. standing - modified push- up, quadruped with emphasis on using right arm as active part of various transitional movements.                  OT Education - 02/19/15 1742    Education provided Yes   Education Details Incorporating right arm back into simple functional tasks, and transitional movements.   Person(s) Educated Patient   Methods Explanation;Demonstration   Comprehension Tactile cues required;Need further instruction          OT Short Term Goals - 02/11/15 1708    OT SHORT TERM GOAL #1   Title Pt will  and family member will be mod I with HEP - 03/04/2015   Status On-going   OT SHORT TERM GOAL #2   Title Pt will demonstrate ability to complete shoulder flexion to 50* without eblow flexion in blilateral reach to work toward functional ow  reach with RUE.   Status On-going   OT SHORT TERM GOAL #3   Title Pt will demonstrate understanding of AE for cutting and tying shoes   Status On-going   OT SHORT TERM GOAL #4   Title Pt will demonstrate beginning isolated grasp in R hand in prepartion for RUE use in functional tasks.   Status On-going   OT SHORT TERM GOAL #5   Title Pt will demonstrate ability to use RUE as stabilizer at least 50% of the time during basic ADL tasks.   Status On-going           OT Long Term Goals - 02/11/15 1709    OT LONG TERM GOAL #1   Title Pt and family will be mod I with updated HEP - 04/01/2015   Status On-going   OT LONG TERM GOAL #2   Title Pt will demonstrate ability for  alternating attention for familiar functional tasks (i.e. cooking task)   Status On-going   OT LONG TERM GOAL #3   Title Pt will demonstrate ability for low unilateral reach with min a  for hand grasp only during a functional task    Status On-going   OT LONG TERM GOAL #4   Title Pt will demonstrate ability to use RUE as gross assist for at least 50% of basic self care tasks.   Status On-going   OT LONG TERM GOAL #5   Title Pt will be able to grasp light cylindrical object with no more than min a.   Status On-going   OT LONG TERM GOAL #6   Title Pt will be able to cast a fisihing pole with RUE with no more than mod a.   Status On-going   OT LONG TERM GOAL #7   Title Pt will be mod I with hot meal prep   Status On-going               Plan - 02/19/15 1744    Clinical Impression Statement Patient progressing toward goals and appears motivated for improved function.  Patient requires repetition of motor patterns due to sensory impairment, perceptual deficits, and learned non use of right UE.     Pt will benefit from skilled therapeutic intervention in order to improve on the following deficits (Retired) Abnormal gait;Decreased balance;Decreased coordination;Decreased cognition;Decreased knowledge of use of DME;Decreased mobility;Decreased range of motion;Decreased strength;Increased edema;Impaired UE functional use;Impaired tone;Impaired sensation;Pain   Rehab Potential Good   Clinical Impairments Affecting Rehab Potential global aphasia, apraxia   OT Frequency 2x / week   OT Duration 8 weeks   OT Treatment/Interventions Self-care/ADL training;Ultrasound;Moist Heat;Electrical Stimulation;DME and/or AE instruction;Neuromuscular education;Therapeutic exercise;Functional Mobility Training;Manual Therapy;Passive range of motion;Splinting;Therapeutic activities;Balance training;Patient/family education;Cognitive remediation/compensation   Plan Weight bearing, NMR Right UE, Low to mid reach  patterns, grasp / release, guided functional use of right UE   Consulted and Agree with Plan of Care Patient        Problem List Patient Active Problem List   Diagnosis Date Noted  . 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 Right hemiparesis 10/21/2014  . Aphasia due to stroke 10/21/2014    Collier Salina, OTR/L 02/19/2015, 5:49 PM  [facility] [address] [place], [place], [address] Phone: [phone]   Fax:  [phone]

## 2015-02-19 NOTE — Patient Instructions (Signed)
These exercises are called 4 way hip exercises.  Attach band to something sturdy like bed post or sturdy dinning room table.  Make sure that it won't move and that you have something to hold onto with your left arm.  You will essentially turn in all four direction, see below.  Make sure you stand tall and keep R knee "soft" (bent slightly) when moving L leg and keep leg that is in band straight and off of ground (don't touch down before returning to starting position).  Perform all directions 10 reps both legs.  Once a day.    Balance, Proprioception: Hip Flexion With Tubing   With tubing attached to ankle of uninvolved leg, swing leg forward. Return.  http://cc.exer.us/18   Copyright  VHI. All rights reserved.   EXTENSION: Standing - Resistance Band (Active)   Stand, both feet flat. Against yellow resistance band, draw right leg behind body as far as possible.  http://gtsc.exer.us/82   Copyright  VHI. All rights reserved.   (Clinic) Balance: With Hip Abduction - Unilateral Stance (Resist)   Opposite side toward pulley, strap around left ankle, leg in. Swing leg across body and out to side. Use arm support only as needed for balance.    Copyright  VHI. All rights reserved.   (Clinic) Balance: With Hip Adduction - Unilateral Stance (Resist)   Left side toward pulley, strap around ankle, foot out to side. Swing foot across body and beyond. Use arm support for balance.   Copyright  VHI. All rights reserved.

## 2015-02-19 NOTE — Patient Instructions (Signed)
Continue to practice the "p" sound by itself.

## 2015-02-19 NOTE — Therapy (Signed)
[facility] [address] [place], [place], [address] Phone: [phone]   Fax:  [phone]  Physical Therapy Treatment  Patient Details  Name: Mario [name] MRN: [id number] Date of Birth: 10/09/1975 Referring Jolan Mealor:  Blane Ohara, MD  Encounter Date: 02/19/2015      PT End of Session - 02/19/15 1634    Visit Number 5   Number of Visits 17   Date for PT Re-Evaluation 04/02/15   PT Start Time 1530   PT Stop Time 1620   PT Time Calculation (min) 50 min   Equipment Utilized During Treatment Gait belt   Activity Tolerance Patient tolerated treatment well   Behavior During Therapy [facility] for tasks assessed/performed      Past Medical History  Diagnosis Date  . Hypertension     Past Surgical History  Procedure Laterality Date  . No past surgeries      There were no vitals filed for this visit.  Visit Diagnosis:  Abnormality of gait  Generalized weakness  Unsteadiness  Hemiparesis following cerebrovascular accident      Subjective Assessment - 02/19/15 1526    Subjective Contines to confirm that brace is loose, therefore added two areas of padding to front to increase contact of brace with shin.  Reports improvement.    Patient is accompained by: Family member   Pertinent History Father Hessie Knows)   Limitations Walking;House hold activities;Reading;Standing;Writing   Patient Stated Goals Improve speech, be able to walk better, and get functional use of RUE.    Currently in Pain? No/denies                         [facility] Adult PT Treatment/Exercise - 02/19/15 1330    Transfers   Transfers Sit to Stand;Stand to [employer] Transfers   Sit to Stand 6: Modified independent (Device/Increase time)   Stand to Sit 6: Modified independent (Device/Increase time)   Stand Pivot Transfers 6: Modified independent (Device/Increase time)   Comments Pt somewhat impulsive with mobility.    Ambulation/Gait   Ambulation/Gait Yes   Ambulation/Gait Assistance 4: Min guard  without AFO for safety   Ambulation Distance (Feet) 115 Feet  + 60 without brace (115 w/ brace to assses comfort)   Assistive device None   Gait Pattern Decreased arm swing - right;Step-through pattern;Decreased stance time - right;Decreased stride length;Decreased dorsiflexion - right;Decreased weight shift to right;Right circumduction;Right genu recurvatum;Lateral hip instability;Poor foot clearance - right   Ambulation Surface Level;Indoor   Self-Care   Self-Care Other Self-Care Comments   Other Self-Care Comments  Provided addition of 4way hip exercises, see exercise and pt instruction for details.    Neuro Re-ed    Neuro Re-ed Details  Performed supine BLE bridging with BLEs on physioball x 10 reps with tactile cues at L hip for decreased activation and light facilitation intermittently at R hip for increased glute activation, decreased pelvic rotation.  Some assist for stability of RLE on physioball.  Progressed to BLE bridging on ball w/ addition of hamstring curl x 10 reps.  Multi modal cues for sequencing and technique.  Performed side stepping squats x 10 reps each direction with assist at R LE to ensure stability and prevent ankle injury.  Also provided assist for hip/knee flex when leading to the L for advancing RLE back towards LLE.  Ended with walking lunges x 5 reps each LE with assist for RLE stability, esp at ankle to decrease ankle  eversion.  Cues for proper technique.     Exercises   Exercises Other Exercises   Other Exercises  Performed 4 way hip exercises in // bars x 10 reps each direction.  See pt instruction for details and cues given to father as well.  Cues needed for posture, safety of RLE.                  PT Education - 02/19/15 1634    Education provided Yes   Education Details Addition of 4 way hip to HEP, see pt instruction.    Person(s) Educated Patient;Parent(s)   Methods  Explanation;Demonstration;Tactile cues;Verbal cues   Comprehension Verbalized understanding;Returned demonstration;Need further instruction          PT Short Term Goals - 02/11/15 1633    PT SHORT TERM GOAL #1   Title Pt will improve BERG balance score to 56/56 to decrease fall risk and improve functional mobility. (Target 03/03/15)   Baseline 50/56 on 8/31   Time 4   Period Weeks   PT SHORT TERM GOAL #2   Title Pt will improve gait speed to 2.83 ft/sec to improve functional ambulation level to community ambulator.   (Target 03/03/15)   Baseline 2.01 on 8/31   Time 4   Period Weeks   PT SHORT TERM GOAL #3   Title Pt will ambulate >500' on indoor and outdoor surfaces including grass, inclines and curb step without AD with AFO at S level without overt LOB to indicate safe negotiation of indoor and outdoor surfaces.  (Target 03/03/15)   Baseline 4   Period Weeks   PT SHORT TERM GOAL #4   Title Pt will negotiate up/down 4 steps in step to pattern with single rail at mod I level with safe technique to traverse community obstacles.   (Target 03/03/15)   Baseline min/guard to min A on 8/31   Time 4   Period Weeks   PT SHORT TERM GOAL #5   Title Pt will improve to 498' to improve ambulation function and functional endurance.    Baseline 441' on 8/31   Time 4   Period Weeks           PT Long Term Goals - 02/11/15 1633    PT LONG TERM GOAL #1   Title Pts SIS mobility score will improve to 80% to indicate improved perceived functional mobility.  (Target 03/31/15)   Baseline 66.7% on 8/31   Time 8   Period Weeks   PT LONG TERM GOAL #2   Title Pt will improve ankle DF strength to 3/5 in order to increase safety with household ambulation and begin to address ambulation without use of AFO.  (Target 03/31/15)   Baseline 1/5 on 8/31   Time 8   Period Weeks   PT LONG TERM GOAL #3   Title Pt will verbalize ability to return to leisure activities to indicate return to community  function.   (Target 03/31/15)   Time 8   Period Weeks   PT LONG TERM GOAL #4   Title Pt will negotiate up/down 4 steps without rail in alternating fashion to traverse community stairs while carrying object.  (Target 03/31/15)   Time 8   Period Weeks   PT LONG TERM GOAL #5   Title Pt will demonstrate ability to ambulate up to 150' with decreased R genu recurvatum to improve R knee stability/strength and prevent knee injury.     Baseline 8   Period Weeks  PT LONG TERM GOAL #6   Title Pt will demonstrate ability to perform SLS x 5 secs on RLE to improve balance and awareness of RLE.    Time 8   Period Weeks               Plan - 02/19/15 1635    Clinical Impression Statement Skilled session focused on addition of 4way hip to HEP for strengthening and stance control in RLE as well as RLE motor control and strengthening to improve gait mechanics and function.     Pt will benefit from skilled therapeutic intervention in order to improve on the following deficits Abnormal gait;Decreased balance;Decreased coordination;Decreased knowledge of precautions;Decreased safety awareness;Decreased strength;Difficulty walking;Impaired perceived functional ability;Impaired sensation;Impaired flexibility;Impaired tone;Impaired UE functional use;Postural dysfunction;Decreased knowledge of use of DME   Rehab Potential Excellent   Clinical Impairments Affecting Rehab Potential adhering to safety precautions   PT Frequency 2x / week   PT Duration 8 weeks   PT Treatment/Interventions ADLs/Self Care Home Management;Electrical Stimulation;Biofeedback;DME Instruction;Gait training;Stair training;Functional mobility training;Therapeutic exercise;Balance training;Neuromuscular re-education;Patient/family education;Manual techniques;Visual/perceptual remediation/compensation   PT Next Visit Plan R knee stability (without AFO), stretching HC (ensure that father assisting with this at home), PNF to decrease tone,  isolated hip/knee flex, RLE WB, made additions to AFO, check for comfort.    PT Home Exercise Plan see pt instruction   Consulted and Agree with Plan of Care Patient   Family Member Consulted Father Hessie Knows)        Problem List Patient Active Problem List   Diagnosis Date Noted  . 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 Right hemiparesis 10/21/2014  . Aphasia due to stroke 10/21/2014    Harriet Butte, PT, MPT [facility] [address] [place], [place], [address] Phone: [phone]   Fax:  [phone] 02/19/2015, 4:39 PM

## 2015-02-23 ENCOUNTER — Ambulatory Visit: Payer: BC Managed Care – PPO | Admitting: Speech Pathology

## 2015-02-23 ENCOUNTER — Encounter: Payer: Self-pay | Admitting: Occupational Therapy

## 2015-02-23 ENCOUNTER — Ambulatory Visit: Payer: BC Managed Care – PPO | Admitting: Rehabilitative and Restorative Service Providers"

## 2015-02-23 ENCOUNTER — Ambulatory Visit: Payer: BC Managed Care – PPO | Admitting: Occupational Therapy

## 2015-02-23 DIAGNOSIS — G8111 Spastic hemiplegia affecting right dominant side: Secondary | ICD-10-CM

## 2015-02-23 DIAGNOSIS — R531 Weakness: Secondary | ICD-10-CM

## 2015-02-23 DIAGNOSIS — R201 Hypoesthesia of skin: Secondary | ICD-10-CM

## 2015-02-23 DIAGNOSIS — M25511 Pain in right shoulder: Secondary | ICD-10-CM

## 2015-02-23 DIAGNOSIS — R4701 Aphasia: Secondary | ICD-10-CM

## 2015-02-23 DIAGNOSIS — R269 Unspecified abnormalities of gait and mobility: Secondary | ICD-10-CM

## 2015-02-23 DIAGNOSIS — R4189 Other symptoms and signs involving cognitive functions and awareness: Secondary | ICD-10-CM

## 2015-02-23 DIAGNOSIS — R482 Apraxia: Secondary | ICD-10-CM

## 2015-02-23 DIAGNOSIS — M6281 Muscle weakness (generalized): Secondary | ICD-10-CM

## 2015-02-23 DIAGNOSIS — IMO0002 Reserved for concepts with insufficient information to code with codable children: Secondary | ICD-10-CM

## 2015-02-23 NOTE — Therapy (Signed)
[facility] [address] [place], [place], [address] Phone: [phone]   Fax:  [phone]  Speech Language Pathology Treatment  Patient Details  Name: Mario [name] MRN: [id number] Date of Birth: 07-13-1975 Referring Provider:  Blane Ohara, MD  Encounter Date: 02/23/2015      End of Session - 02/23/15 1020    Visit Number 5   Number of Visits 16   Date for SLP Re-Evaluation 05/05/15   SLP Start Time 0932   SLP Stop Time  1017   SLP Time Calculation (min) 45 min   Activity Tolerance Patient tolerated treatment well      Past Medical History  Diagnosis Date  . Hypertension     Past Surgical History  Procedure Laterality Date  . No past surgeries      There were no vitals filed for this visit.  Visit Diagnosis: Verbal apraxia  Expressive aphasia  Receptive aphasia      Subjective Assessment - 02/23/15 0942    Subjective "yes" to did you have homework   Currently in Pain? No/denies               ADULT SLP TREATMENT - 02/23/15 0943    General Information   Behavior/Cognition Alert;Cooperative;Pleasant mood   Treatment Provided   Treatment provided Cognitive-Linquistic   Pain Assessment   Pain Assessment No/denies pain   Cognitive-Linquistic Treatment   Treatment focused on Apraxia;Aphasia   Skilled Treatment Facilitated cv syllables with bilabial - pt used b/p with frequent mod verbal cues, modeling and unison. Cvcv syllables with varying vowels requried max A . Initiated placement for /f/ and cv syllables with /f/ with usual mod modeling, cloze cues and unison. Auditory comprehensio identifying scene ST described in f: 8 - 80% with occasoinal repeptition, simplification. Written expression biographical information 70% accuracy with cues required for error ID.    Assessment / Recommendations / Plan   Plan Continue with current plan of care   Progression Toward Goals   Progression toward  goals Progressing toward goals          SLP Education - 02/23/15 1018    Education provided Yes   Education Details placement for phonemes   Person(s) Educated Patient   Methods Explanation;Demonstration   Comprehension Verbalized understanding;Need further instruction          SLP Short Term Goals - 02/23/15 1019    SLP SHORT TERM GOAL #1   Title pt to perform rote speech tasks (simultaneous with SLP/family) with 25% success, functional responses allowed   Time 3   Period Weeks  or 8 visits - for all STGs   Status On-going   SLP SHORT TERM GOAL #2   Title pt will imitate consonant vowel syllables/words with written, picture, and other nonverbal cues, 60% success, functional responses allowed   Time 3   Period Weeks   Status On-going   SLP SHORT TERM GOAL #3   Title pt will functionally communicate with simple augmentative/alternative device (via pictures/texting/typing) for basic wants/needs 60% success, in viable opportunities   Time 3   Period Weeks   Status On-going   SLP SHORT TERM GOAL #4   Title pt will demo understanding of simple conversational questions via yes/no responses at 80% accuracy   Time 3   Period Weeks   Status On-going   SLP SHORT TERM GOAL #5   Title pt will write name and address with rare nonverbal cues and no signs aphasia  Time 3   Period Weeks   Status On-going          SLP Long Term Goals - 02/23/15 1019    SLP LONG TERM GOAL #1   Title pt will demo undertanding of simple/mod complex conversational questions via yes/no responses 80% accuracy   Time 7   Period Weeks  or 16 visits - for all LTGs   Status On-going   SLP LONG TERM GOAL #2   Title pt will demo undertanding of simple conversational quesitons via yes/no responses 90% accuracy   Time 7   Period Weeks   Status On-going   SLP LONG TERM GOAL #3   Title pt will perform rote speech tasks simultaneously with SLP with 40% accuracy (functional responses allowed)   Time 7    Period Weeks   Status On-going   SLP LONG TERM GOAL #4   Title pt will imitate salient words/family names with 20% success   Time 7   Period Weeks   Status On-going   SLP LONG TERM GOAL #5   Title pt will demo use of simple alternative/augmentative communcation device or multimodal communication with 70% success in message conveyance   Time 7   Period Weeks   Status On-going          Plan - 02/23/15 1018    Clinical Impression Statement 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 Skilled ST remains necessary to maximize pt's language ability.   Speech Therapy Frequency 2x / week   Treatment/Interventions SLP instruction and feedback;Compensatory strategies;Internal/external aids;Environmental controls;Patient/family education;Functional tasks;Cueing hierarchy;Multimodal communcation approach   Potential to Achieve Goals Fair   Potential Considerations Severity of impairments        Problem List Patient Active Problem List   Diagnosis Date Noted  . 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 Right hemiparesis 10/21/2014  . Aphasia due to stroke 10/21/2014    Lovvorn, Radene Journey MS, CCC-SLP 02/23/2015, 10:27 AM  [facility] [address] [place], [place], [address] Phone: [phone]   Fax:  [phone]

## 2015-02-23 NOTE — Therapy (Signed)
[facility] [address] [place], [place], [address] Phone: [phone]   Fax:  [phone]  Occupational Therapy Treatment  Patient Details  Name: Mario [name] MRN: [id number] Date of Birth: 1975-08-01 Referring Provider:  Rochel Brome, MD  Encounter Date: 02/23/2015      OT End of Session - 02/23/15 1252    Visit Number 6   Number of Visits 16   Date for OT Re-Evaluation 04/01/15   Authorization Type BCBS state health PPO no visit limit   OT Start Time 0845   OT Stop Time 0929   OT Time Calculation (min) 44 min   Activity Tolerance Patient tolerated treatment well      Past Medical History  Diagnosis Date  . Hypertension     Past Surgical History  Procedure Laterality Date  . No past surgeries      There were no vitals filed for this visit.  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 Yes   Pain Score 1    Pain Location Shoulder   Pain Orientation Right   Pain Descriptors / Indicators Aching   Pain Type Acute pain   Pain Onset In the past 7 days   Pain Frequency Intermittent   Aggravating Factors  with questioning pt able to communicate he probably overdid it trying to use the arm   Pain Relieving Factors unknown                      OT Treatments/Exercises (OP) - 02/23/15 0001    Neurological Re-education Exercises   Other Exercises 1 Pt arrived today with increased tone in RUE, trunk and RLE.  Neuro  re ed to address trunk control in supine, sitting and standing with emphasis on both isometric and isotonic strengthening and reciprocal movement to achieve bettter trunk control and improved alignment of scapula on trunk. Pt with improved normalization of tone by end of session and able to complete bilateral reach to 90* with min compensations. Pt with continued improvement in hand function especially with lateral pinch and partial finger extension today. Pt is extremely motivated                  OT Short Term Goals - 02/23/15 1249    OT SHORT TERM GOAL #1   Title Pt will  and family member will be mod I with HEP - 03/04/2015   Status On-going   OT SHORT TERM GOAL #2   Title Pt will demonstrate ability to complete shoulder flexion to 50* without eblow flexion in blilateral reach to work toward functional low reach with RUE.   Status Achieved   OT SHORT TERM GOAL #3   Title Pt will demonstrate understanding of AE for cutting and tying shoes   Status Achieved   OT SHORT TERM GOAL #4   Title Pt will demonstrate beginning isolated grasp in R hand in prepartion for RUE use in functional tasks.  Status Achieved   OT SHORT TERM GOAL #5   Title Pt will demonstrate ability to use RUE as stabilizer at least 50% of the time during basic ADL tasks.   Status On-going           OT Long Term Goals - 02/23/15 1250    OT LONG TERM GOAL #1   Title Pt and family will be mod I with updated HEP - 04/01/2015   Status On-going   OT LONG TERM GOAL #2   Title Pt will demonstrate ability for alternating attention for familiar functional tasks (i.e. cooking task)   Status On-going   OT LONG TERM GOAL #3   Title Pt will demonstrate ability for low unilateral reach with min a  for hand grasp only during a functional task    Status On-going   OT LONG TERM GOAL #4   Title Pt will demonstrate ability to use RUE as gross assist for at least 50% of basic self care tasks.   Status On-going   OT LONG  TERM GOAL #5   Title Pt will be able to grasp light cylindrical object with no more than min a.   Status On-going   OT LONG TERM GOAL #6   Title Pt will be able to cast a fisihing pole with RUE with no more than mod a.   Status On-going   OT LONG TERM GOAL #7   Title Pt will be mod I with hot meal prep   Status On-going               Plan - 02/23/15 1250    Clinical Impression Statement Pt is making excellent progresss and has met all but one STG.  Pt with siginficant improvement in RUE return however is also displaying increased tone - will monitor   Pt will benefit from skilled therapeutic intervention in order to improve on the following deficits (Retired) Abnormal gait;Decreased balance;Decreased coordination;Decreased cognition;Decreased knowledge of use of DME;Decreased mobility;Decreased range of motion;Decreased strength;Increased edema;Impaired UE functional use;Impaired tone;Impaired sensation;Pain   Rehab Potential Good   Clinical Impairments Affecting Rehab Potential global aphasia, apraxia   OT Frequency 2x / week   OT Duration 8 weeks   OT Treatment/Interventions Self-care/ADL training;Ultrasound;Moist Heat;Electrical Stimulation;DME and/or AE instruction;Neuromuscular education;Therapeutic exercise;Functional Mobility Training;Manual Therapy;Passive range of motion;Splinting;Therapeutic activities;Balance training;Patient/family education;Cognitive remediation/compensation   Plan NMR RUE for low unilateral reach, grasp and relase, trunk control, guided functional use of RUE   Consulted and Agree with Plan of Care Patient        Problem List Patient Active Problem List   Diagnosis Date Noted  . 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 Right hemiparesis 10/21/2014  . Aphasia due to stroke 10/21/2014    Quay Burow, OTR/L 02/23/2015, 12:55 PM  [facility] [address] [place], [place], [address] Phone: [phone]   Fax:  [phone]

## 2015-02-23 NOTE — Patient Instructions (Signed)
Continue practice with /p/ syllables

## 2015-02-23 NOTE — Therapy (Signed)
[facility] [address] [place], [place], [address] Phone: [phone]   Fax:  [phone]  Physical Therapy Treatment  Patient Details  Name: Mario [name] MRN: [id number] Date of Birth: [birth year] Referring Laquan Beier:  Blane Ohara, MD  Encounter Date: 02/23/2015      PT End of Session - 02/23/15 2119    Visit Number 6   Number of Visits 17   Date for PT Re-Evaluation 04/02/15   PT Start Time 1020   PT Stop Time 1100   PT Time Calculation (min) 40 min   Equipment Utilized During Treatment Gait belt   Activity Tolerance Patient tolerated treatment well   Behavior During Therapy [facility] for tasks assessed/performed      Past Medical History  Diagnosis Date  . Hypertension     Past Surgical History  Procedure Laterality Date  . No past surgeries      There were no vitals filed for this visit.  Visit Diagnosis:  Abnormality of gait  Generalized weakness      Subjective Assessment - 02/23/15 1019    Subjective The patient aphasic.  confirms through questions no falls and brace feels better this week.  He is not using brace or device in the house.   Patient Stated Goals Improve speech, be able to walk better, and get functional use of RUE.    Currently in Pain? No/denies      Gait: Ambulation with AFO (toe off) brace right LE with tactile cues for R trunk elongation and R UE into ER for improved scapular support x 120 feet Gait without device x 115 feet with cues on R hip initiatoin and trunk elongation for improved foot clearance during gait  NEUROMUSCULAR RE-EDUCATION: Emphasized R stance control through R weight shifting moving L LE from compliant surfaces to ground with one UE support Seated physioball lifting L LE for R control with min A and postural cues for anterior pelvic tilt Stride position working on R push-off for R terminal stance>initial swing phase of gait with facilitation for trunk  elongation and using L UE for reaching Moving R LE through swing phase of gait with emphasis on R knee flexion and R ankle dorsiflexion to facilitate R heel strike Standing with R LE moving belt of treadmill for improved co-contraction of muscles during stance phase of gait, then L LE x 5 reps each side with min A  THERAPEUTIC EXERCISE: STanding heel cord stretch with facilitation of position Heel pulls on stool for hamstring contraction x 50 feet with cues on technique and foot position with supervision          PT Short Term Goals - 02/11/15 1633    PT SHORT TERM GOAL #1   Title Pt will improve BERG balance score to 56/56 to decrease fall risk and improve functional mobility. (Target 03/03/15)   Baseline 50/56 on 8/31   Time 4   Period Weeks   PT SHORT TERM GOAL #2   Title Pt will improve gait speed to 2.83 ft/sec to improve functional ambulation level to community ambulator.   (Target 03/03/15)   Baseline 2.01 on 8/31   Time 4   Period Weeks   PT SHORT TERM GOAL #3   Title Pt will ambulate >500' on indoor and outdoor surfaces including grass, inclines and curb step without AD with AFO at S level without overt LOB to indicate safe negotiation of indoor and outdoor surfaces.  (Target 03/03/15)   Baseline 4  Period Weeks   PT SHORT TERM GOAL #4   Title Pt will negotiate up/down 4 steps in step to pattern with single rail at mod I level with safe technique to traverse community obstacles.   (Target 03/03/15)   Baseline min/guard to min A on 8/31   Time 4   Period Weeks   PT SHORT TERM GOAL #5   Title Pt will improve to 498' to improve ambulation function and functional endurance.    Baseline 441' on 8/31   Time 4   Period Weeks           PT Long Term Goals - 02/11/15 1633    PT LONG TERM GOAL #1   Title Pts SIS mobility score will improve to 80% to indicate improved perceived functional mobility.  (Target 03/31/15)   Baseline 66.7% on 8/31   Time 8   Period Weeks    PT LONG TERM GOAL #2   Title Pt will improve ankle DF strength to 3/5 in order to increase safety with household ambulation and begin to address ambulation without use of AFO.  (Target 03/31/15)   Baseline 1/5 on 8/31   Time 8   Period Weeks   PT LONG TERM GOAL #3   Title Pt will verbalize ability to return to leisure activities to indicate return to community function.   (Target 03/31/15)   Time 8   Period Weeks   PT LONG TERM GOAL #4   Title Pt will negotiate up/down 4 steps without rail in alternating fashion to traverse community stairs while carrying object.  (Target 03/31/15)   Time 8   Period Weeks   PT LONG TERM GOAL #5   Title Pt will demonstrate ability to ambulate up to 150' with decreased R genu recurvatum to improve R knee stability/strength and prevent knee injury.     Baseline 8   Period Weeks   PT LONG TERM GOAL #6   Title Pt will demonstrate ability to perform SLS x 5 secs on RLE to improve balance and awareness of RLE.    Time 8   Period Weeks               Plan - 02/23/15 2120    Clinical Impression Statement Treatment plan emphasized stance control on R LE and components of gait cycle isolating motor control of the R LE.    PT Next Visit Plan R knee stability (without AFO), stretching HC (ensure that father assisting with this at home), PNF to decrease tone, isolated hip/knee flex, RLE WB.  BEGIN CHECKING STGs.   Consulted and Agree with Plan of Care Patient        Problem List Patient Active Problem List   Diagnosis Date Noted  . 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 Right hemiparesis 10/21/2014  . Aphasia due to stroke 10/21/2014    WEAVER,CHRISTINA, PT 02/23/2015, 9:22 PM  [place] [facility] [address] [place], [place], [address] Phone: [phone]   Fax:  [phone]

## 2015-02-25 ENCOUNTER — Ambulatory Visit: Payer: BC Managed Care – PPO | Admitting: Occupational Therapy

## 2015-02-25 ENCOUNTER — Encounter: Payer: Self-pay | Admitting: Occupational Therapy

## 2015-02-25 ENCOUNTER — Ambulatory Visit: Payer: BC Managed Care – PPO | Admitting: Speech Pathology

## 2015-02-25 ENCOUNTER — Ambulatory Visit: Payer: BC Managed Care – PPO | Admitting: Physical Therapy

## 2015-02-25 DIAGNOSIS — G8111 Spastic hemiplegia affecting right dominant side: Secondary | ICD-10-CM

## 2015-02-25 DIAGNOSIS — R4189 Other symptoms and signs involving cognitive functions and awareness: Secondary | ICD-10-CM

## 2015-02-25 DIAGNOSIS — M6281 Muscle weakness (generalized): Secondary | ICD-10-CM

## 2015-02-25 DIAGNOSIS — R4701 Aphasia: Secondary | ICD-10-CM

## 2015-02-25 DIAGNOSIS — R201 Hypoesthesia of skin: Secondary | ICD-10-CM

## 2015-02-25 DIAGNOSIS — R482 Apraxia: Secondary | ICD-10-CM

## 2015-02-25 DIAGNOSIS — R2681 Unsteadiness on feet: Secondary | ICD-10-CM

## 2015-02-25 DIAGNOSIS — R269 Unspecified abnormalities of gait and mobility: Secondary | ICD-10-CM

## 2015-02-25 DIAGNOSIS — IMO0002 Reserved for concepts with insufficient information to code with codable children: Secondary | ICD-10-CM

## 2015-02-25 NOTE — Patient Instructions (Signed)
  Pa Pie Pee Po Pay  Popeye Papi Bubble Bobby   Lollipop Tip top Lime pie  Cook Cookie Cake Kick Coach [employer], Days, Months, Recite prayers, pledge, anything you have memorized

## 2015-02-25 NOTE — Therapy (Signed)
[facility] [address] [place], [place], [address] Phone: [phone]   Fax:  [phone]  Occupational Therapy Treatment  Patient Details  Name: Mario [name] MRN: [id number] Date of Birth: 1975/07/13 Referring Provider:  Rochel Brome, MD  Encounter Date: 02/25/2015      OT End of Session - 02/25/15 1549    Visit Number 7   Number of Visits 16   Date for OT Re-Evaluation 04/01/15   Authorization Type [employer]BS state health PPO no visit limit   OT Start Time 1315   OT Stop Time 1359   OT Time Calculation (min) 44 min   Activity Tolerance Patient tolerated treatment well      Past Medical History  Diagnosis Date  . Hypertension     Past Surgical History  Procedure Laterality Date  . No past surgeries      There were no vitals filed for this visit.  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   Pertinent History See epic snaphshot, L MCA CVA, HTN   Patient Stated Goals Per father, speech, RUE and walking to improve   Currently in Pain? No/denies                      OT Treatments/Exercises (OP) - 02/25/15 0001    Neurological Re-education Exercises   Other Exercises 1 Neuro re ed to address strategies to allow pt to begin to use RUE more functionally. Pt is faster and tasks easier if pt uses unaffected side. Discussed that if pt wants his RUE to progress he cannot wait for all the movement to be there but must start using RUE functionally as much as possible.  Practiced numerous activities with different strategies and pt is able to use RUE far more than he realized. See pt instruction for details of activities that pt has been  instructed to do at home. Also met with dad to discuss and to ask dad to encourage and cue pt to use RUE more. Poor sensation and apraxia also impact pt's ability to use RUE.                 OT Education - 02/25/15 1544    Education provided Yes   Education Details functional use of RUE at home   Person(s) Educated Patient;Parent(s)  dad   Methods Explanation;Demonstration;Tactile cues;Verbal cues;Handout   Comprehension Verbalized understanding;Returned demonstration  dad verbalized,pt noted yes and able to return demonstrate all activities          OT Short Term Goals - 02/25/15 1545    OT SHORT TERM GOAL #1   Title Pt will  and family member will be mod I with HEP - 03/04/2015   Status Achieved   OT SHORT TERM GOAL #2   Title Pt will demonstrate ability to complete shoulder flexion to 50* without eblow flexion in blilateral reach to work toward functional low reach with RUE.   Status Achieved   OT SHORT TERM GOAL #3   Title Pt will demonstrate understanding of AE for cutting and tying shoes   Status Achieved   OT SHORT TERM GOAL #4   Title Pt will demonstrate beginning isolated grasp  in R hand in prepartion for RUE use in functional tasks.   Status Achieved   OT SHORT TERM GOAL #5   Title Pt will demonstrate ability to use RUE as stabilizer at least 50% of the time during basic ADL tasks.   Status On-going           OT Long Term Goals - 02/25/15 1546    OT LONG TERM GOAL #1   Title Pt and family will be mod I with updated HEP - 04/01/2015   Status On-going   OT LONG TERM GOAL #2   Title Pt will demonstrate ability for alternating attention for familiar functional tasks (i.e. cooking task)   Status On-going   OT LONG TERM GOAL #3   Title Pt will demonstrate ability for low unilateral reach with min a  for hand grasp only during a functional task    Status On-going   OT LONG TERM GOAL #4   Title Pt will demonstrate ability to use RUE as gross assist for at  least 50% of basic self care tasks.   Status On-going   OT LONG TERM GOAL #5   Title Pt will be able to grasp light cylindrical object with no more than min a.   Status On-going   OT LONG TERM GOAL #6   Title Pt will be able to cast a fisihing pole with RUE with no more than mod a.   Status On-going   OT LONG TERM GOAL #7   Title Pt will be mod I with hot meal prep   Status On-going               Plan - 02/25/15 1547    Clinical Impression Statement Pt continues to demonstrate great return in RUE however is not using RUE in functional tasks. Pt given numerous activities to do consistently at home to incoprorate RUE and dad also educated.    Pt will benefit from skilled therapeutic intervention in order to improve on the following deficits (Retired) Abnormal gait;Decreased balance;Decreased coordination;Decreased cognition;Decreased knowledge of use of DME;Decreased mobility;Decreased range of motion;Decreased strength;Increased edema;Impaired UE functional use;Impaired tone;Impaired sensation;Pain   Rehab Potential Good   Clinical Impairments Affecting Rehab Potential global aphasia, apraxia   OT Frequency 2x / week   OT Duration 8 weeks   OT Treatment/Interventions Self-care/ADL training;Ultrasound;Moist Heat;Electrical Stimulation;DME and/or AE instruction;Neuromuscular education;Therapeutic exercise;Functional Mobility Training;Manual Therapy;Passive range of motion;Splinting;Therapeutic activities;Balance training;Patient/family education;Cognitive remediation/compensation   Plan check on functional use of R arm at home, NMR for low unilateral reach, grasp and release, trunk control, guided functional use of RUE   Consulted and Agree with Plan of Care Patient   Family Member Consulted father        Problem List Patient Active Problem List   Diagnosis Date Noted  . 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 Right hemiparesis 10/21/2014  . Aphasia due to stroke 10/21/2014    Quay Burow, OTR/L 02/25/2015, 3:50 PM  [place] [address] [place], [place], [address] Phone: [phone]   Fax:  [phone]

## 2015-02-25 NOTE — Patient Instructions (Signed)
Home program for Right arm!!    It is crucial that you start using the movement in you have in your right arm in a more functional way.  You can't wait for it to be perfect before you start using the arm. If you keep doing everything with your left hand, your right arm will not get better.  It will take you longer to use your right hand but eventually the right arm will get stronger and then it won't take so long.  1. With ANY activity that requires two hands ask yourself the following:  Can I do any PART of this task with my right hand (for example, can I hold the tooth paste tube in my right hand and then use my left hand to open it)  2.  Can I do this activity using the left hand to "help with the weight of the task." For example, we practiced using your right hand and left hand together to drink from a cup.  3. Every day use your right hand in the shower to wash your belly, both legs and your left arm from the elbow down.    4. Every day use both hands to wash your face  5. Every day use both hands at each meal to drink from a cup.  6. Every day make your right arm REACH into your shirt instead of using your left hand to dress your right arm. Try and use both hands to put your shirt over your head.  Use your right hand to take the shirt off your left arm.  7. Every day use the red foam on your utensil and both hands to try and eat at least part of your meal.  Use your left hand to support the wrist of your right hand.

## 2015-02-25 NOTE — Therapy (Signed)
[facility] [address] [place], [place], [address] Phone: [phone]   Fax:  [phone]  Speech Language Pathology Treatment  Patient Details  Name: Mario [name] MRN: [id number] Date of Birth: [birth year] Referring Provider:  Blane Ohara, MD  Encounter Date: 02/25/2015      End of Session - 02/25/15 1454    Visit Number 6   Number of Visits 16   Date for SLP Re-Evaluation 05/05/15   SLP Start Time 1403   SLP Stop Time  1447   SLP Time Calculation (min) 44 min   Activity Tolerance Patient tolerated treatment well      Past Medical History  Diagnosis Date  . Hypertension     Past Surgical History  Procedure Laterality Date  . No past surgeries      There were no vitals filed for this visit.  Visit Diagnosis: Verbal apraxia  Expressive aphasia               SLP Education - 02/25/15 1450    Education provided Yes   Education Details use short phrases, paraphrase to ensure Kenta understands complex topics in conversation   Person(s) Educated Patient;Parent(s)   Methods Explanation;Demonstration   Comprehension Verbalized understanding;Need further instruction          SLP Short Term Goals - 02/25/15 1453    SLP SHORT TERM GOAL #1   Title pt to perform rote speech tasks (simultaneous with SLP/family) with 25% success, functional responses allowed   Time 3   Period Weeks  or 8 visits - for all STGs   Status On-going   SLP SHORT TERM GOAL #2   Title pt will imitate consonant vowel syllables/words with written, picture, and other nonverbal cues, 60% success, functional responses allowed   Time 3   Period Weeks   Status On-going   SLP SHORT TERM GOAL #3   Title pt will functionally communicate with simple augmentative/alternative device (via pictures/texting/typing) for basic wants/needs 60% success, in viable opportunities   Time 3   Period Weeks   Status On-going   SLP SHORT  TERM GOAL #4   Title pt will demo understanding of simple conversational questions via yes/no responses at 80% accuracy   Time 3   Period Weeks   Status On-going   SLP SHORT TERM GOAL #5   Title pt will write name and address with rare nonverbal cues and no signs aphasia   Time 3   Period Weeks   Status On-going          SLP Long Term Goals - 02/25/15 1454    SLP LONG TERM GOAL #1   Title pt will demo undertanding of simple/mod complex conversational questions via yes/no responses 80% accuracy   Time 7   Period Weeks  or 16 visits - for all LTGs   Status On-going   SLP LONG TERM GOAL #2   Title pt will demo undertanding of simple conversational quesitons via yes/no responses 90% accuracy   Time 7   Period Weeks   Status On-going   SLP LONG TERM GOAL #3   Title pt will perform rote speech tasks simultaneously with SLP with 40% accuracy (functional responses allowed)   Time 7   Period Weeks   Status On-going   SLP LONG TERM GOAL #4   Title pt will imitate salient words/family names with 20% success   Time 7   Period Weeks   Status On-going   SLP LONG  TERM GOAL #5   Title pt will demo use of simple alternative/augmentative communcation device or multimodal communication with 70% success in message conveyance   Time 7   Period Weeks   Status On-going          Plan - 02/25/15 1451    Clinical Impression Statement Pt  generating cv and cvcv syllables with p/b, k/g, f/v, m, with usual mod to min A. Social  speech, automatic speech and one word utterance continue to be primarily vowels unless pt given mod A from ST. Continue skilled ST to maximize verbal and written expression and auditory comprehensoin.    Speech Therapy Frequency 2x / week   Treatment/Interventions SLP instruction and feedback;Compensatory strategies;Internal/external aids;Environmental controls;Patient/family education;Functional tasks;Cueing hierarchy;Multimodal communcation approach   Potential to  Achieve Goals Good   Potential Considerations Severity of impairments        Problem List Patient Active Problem List   Diagnosis Date Noted  . 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 Right hemiparesis 10/21/2014  . Aphasia due to stroke 10/21/2014    Lovvorn, Radene Journey MS, CCC-SLP 02/25/2015, 2:55 PM  [facility] [address] [place], [place], [address] Phone: [phone]   Fax:  [phone]

## 2015-02-25 NOTE — Therapy (Signed)
[facility] [address] [place], [place], [address] Phone: [phone]   Fax:  [phone]  Physical Therapy Treatment  Patient Details  Name: Mario [name] MRN: [id number] Date of Birth: [birth year] Referring Provider:  Rochel Brome, MD  Encounter Date: 02/25/2015      PT End of Session - 02/25/15 1911    Visit Number 7   Number of Visits 17   Date for PT Re-Evaluation 04/02/15   PT Start Time 2263   PT Stop Time 1618   PT Time Calculation (min) 44 min   Activity Tolerance Patient tolerated treatment well   Behavior During Therapy [facility] for tasks assessed/performed      Past Medical History  Diagnosis Date  . Hypertension     Past Surgical History  Procedure Laterality Date  . No past surgeries      There were no vitals filed for this visit.  Visit Diagnosis:  Abnormality of gait  Spastic hemiplegia affecting right dominant side  Unsteadiness      Subjective Assessment - 02/25/15 1540    Subjective  Arrived without cane today. Shook head "no" when asked if in pain. Nodded "yes" when asked if performing heel cord stretching at home.   Patient is accompained by: Family member   Pertinent History Father Hendricks Milo)   Limitations Walking;House hold activities;Reading;Standing;Writing   Patient Stated Goals Improve speech, be able to walk better, and get functional use of RUE.    Currently in Pain? No/denies                         [facility] Adult PT Treatment/Exercise - 02/25/15 0001    Transfers   Transfers Sit to Stand;Stand to Sit;Stand Pivot Transfers   Sit to Stand 6: Modified independent (Device/Increase time)   Stand to Sit 6: Modified independent (Device/Increase time)   Stand Pivot Transfers 6: Modified independent (Device/Increase time)   Ambulation/Gait   Ambulation/Gait Yes   Ambulation/Gait Assistance 5: Supervision;4: Min guard  without AFO for safety   Ambulation/Gait  Assistance Details During RLE advancement/placement, noted increased R ankle PF, forefoot supination   Ambulation Distance (Feet) 500 Feet   Assistive device None   Gait Pattern Decreased arm swing - right;Step-through pattern;Decreased stance time - right;Decreased stride length;Decreased dorsiflexion - right;Decreased weight shift to right;Right circumduction;Right genu recurvatum;Lateral hip instability;Poor foot clearance - right   Ambulation Surface Level;Unlevel;Indoor;Outdoor;Paved   Stairs Yes   Stairs Assistance 6: Modified independent (Device/Increase time);5: Supervision   Stairs Assistance Details (indicate cue type and reason) x2 trials; initially with single rail and mod I (to address STG). Subsequent trial with B rails with (S), cueing for attention to/functional use of RUE, for more normalized weight shift   Stair Management Technique One rail Left;Two rails;Alternating pattern;Forwards   Number of Stairs 8   Height of Stairs 6   Door Management 5: Supervision   Curb 5: Supervision   Curb Details (indicate cue type and reason) no AD   Self-Care   Self-Care --   Other Self-Care Comments  --   Neuro Re-ed    Neuro Re-ed Details  Performed the following in tall kneeling without UE support to address extension synergy in RLE, for increased proximal stability, proprioceptive input: lateral stepping 3 x5 reps per direction; LLE forward/retro stepping for anterior/lateral weight shift to R with verbal/tactile cueing focused on maintaining R trunk elongation. Transitioned from tall kneeling <> L half kneeling x2 trials with  RUE on physioball. Supine: performed prolonged passive stretch of R ankle plantarflexors/supinators x3 minutes to manage hypertonia. Transitioned to RLE PNF D2 flexion/extension x5 reps rhythmic initiation, x15 reps resisted with focus on R hip/knee flexion (to address circumduction during RLE advancement).    Exercises   Exercises --   Other Exercises  --                   PT Short Term Goals - 02/25/15 1915    PT SHORT TERM GOAL #1   Title Pt will improve BERG balance score to 56/56 to decrease fall risk and improve functional mobility. (Target 03/03/15)   Baseline 50/56 on 8/31   Time 4   Period Weeks   PT SHORT TERM GOAL #2   Title Pt will improve gait speed to 2.83 ft/sec to improve functional ambulation level to community ambulator.   (Target 03/03/15)   Baseline 2.01 on 8/31   Time 4   Period Weeks   PT SHORT TERM GOAL #3   Title Pt will ambulate >500' on indoor and outdoor surfaces including grass, inclines and curb step without AD with AFO at S level without overt LOB to indicate safe negotiation of indoor and outdoor surfaces.  (Target 03/03/15)   Baseline 4   Period Weeks   PT SHORT TERM GOAL #4   Title Pt will negotiate up/down 4 steps in step to pattern with single rail at mod I level with safe technique to traverse community obstacles.   (Target 03/03/15)   Baseline Met on 9/22.   Time 4   Period Weeks   Status Achieved   PT SHORT TERM GOAL #5   Title Pt will improve 3MWT to 58' to improve ambulation function and functional endurance.    Baseline 441' on 8/31   Time 4   Period Weeks           PT Long Term Goals - 02/11/15 1633    PT LONG TERM GOAL #1   Title Pts SIS mobility score will improve to 80% to indicate improved perceived functional mobility.  (Target 03/31/15)   Baseline 66.7% on 8/31   Time 8   Period Weeks   PT LONG TERM GOAL #2   Title Pt will improve ankle DF strength to 3/5 in order to increase safety with household ambulation and begin to address ambulation without use of AFO.  (Target 03/31/15)   Baseline 1/5 on 8/31   Time 8   Period Weeks   PT LONG TERM GOAL #3   Title Pt will verbalize ability to return to leisure activities to indicate return to community function.   (Target 03/31/15)   Time 8   Period Weeks   PT LONG TERM GOAL #4   Title Pt will negotiate up/down 4 steps without  rail in alternating fashion to traverse community stairs while carrying object.  (Target 03/31/15)   Time 8   Period Weeks   PT LONG TERM GOAL #5   Title Pt will demonstrate ability to ambulate up to 150' with decreased R genu recurvatum to improve R knee stability/strength and prevent knee injury.     Baseline 8   Period Weeks   PT LONG TERM GOAL #6   Title Pt will demonstrate ability to perform SLS x 5 secs on RLE to improve balance and awareness of RLE.    Time 8   Period Weeks  Plan - 02/25/15 1913    Clinical Impression Statement Skilled session focused on increasing RLE proximal stability/control for improved gait stability. STG for stair negotiation met. Continue per POC.   Pt will benefit from skilled therapeutic intervention in order to improve on the following deficits Abnormal gait;Decreased balance;Decreased coordination;Decreased knowledge of precautions;Decreased safety awareness;Decreased strength;Difficulty walking;Impaired perceived functional ability;Impaired sensation;Impaired flexibility;Impaired tone;Impaired UE functional use;Postural dysfunction;Decreased knowledge of use of DME   Rehab Potential Excellent   Clinical Impairments Affecting Rehab Potential adhering to safety precautions   PT Frequency 2x / week   PT Duration 8 weeks   PT Treatment/Interventions ADLs/Self Care Home Management;Electrical Stimulation;Biofeedback;DME Instruction;Gait training;Stair training;Functional mobility training;Therapeutic exercise;Balance training;Neuromuscular re-education;Patient/family education;Manual techniques;Visual/perceptual remediation/compensation   PT Next Visit Plan R knee stability (without AFO), stretching HC (ensure that father assisting with this at home), PNF to decrease tone, isolated hip/knee flex, RLE WB. CONTINUE CHECKING STGs.   Consulted and Agree with Plan of Care Patient   Family Member Consulted Father Hendricks Milo)        Problem  List Patient Active Problem List   Diagnosis Date Noted  . 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 Right hemiparesis 10/21/2014  . Aphasia due to stroke 10/21/2014    Billie Ruddy, PT, DPT [facility] [address] [place], [place], [address] Phone: [phone]   Fax:  [phone] 02/25/2015, 7:22 PM

## 2015-02-26 ENCOUNTER — Ambulatory Visit: Payer: BC Managed Care – PPO | Admitting: Physical Therapy

## 2015-03-01 ENCOUNTER — Ambulatory Visit: Payer: BC Managed Care – PPO | Admitting: Occupational Therapy

## 2015-03-01 ENCOUNTER — Ambulatory Visit: Payer: BC Managed Care – PPO | Admitting: Rehabilitative and Restorative Service Providers"

## 2015-03-01 ENCOUNTER — Encounter: Payer: Self-pay | Admitting: Occupational Therapy

## 2015-03-01 ENCOUNTER — Ambulatory Visit: Payer: BC Managed Care – PPO | Admitting: Speech Pathology

## 2015-03-01 DIAGNOSIS — R4189 Other symptoms and signs involving cognitive functions and awareness: Secondary | ICD-10-CM

## 2015-03-01 DIAGNOSIS — R4701 Aphasia: Secondary | ICD-10-CM

## 2015-03-01 DIAGNOSIS — R531 Weakness: Secondary | ICD-10-CM

## 2015-03-01 DIAGNOSIS — G8111 Spastic hemiplegia affecting right dominant side: Secondary | ICD-10-CM

## 2015-03-01 DIAGNOSIS — M6281 Muscle weakness (generalized): Secondary | ICD-10-CM

## 2015-03-01 DIAGNOSIS — R482 Apraxia: Secondary | ICD-10-CM

## 2015-03-01 DIAGNOSIS — IMO0002 Reserved for concepts with insufficient information to code with codable children: Secondary | ICD-10-CM

## 2015-03-01 DIAGNOSIS — R269 Unspecified abnormalities of gait and mobility: Secondary | ICD-10-CM

## 2015-03-01 DIAGNOSIS — R201 Hypoesthesia of skin: Secondary | ICD-10-CM

## 2015-03-01 NOTE — Therapy (Signed)
[facility] [address] [place], [place], [address] Phone: [phone]   Fax:  [phone]  Speech Language Pathology Treatment  Patient Details  Name: Mario [name] MRN: [id number] Date of Birth: 1975-09-03 Referring Provider:  Blane Ohara, MD  Encounter Date: 03/01/2015      End of Session - 03/01/15 1102    Visit Number 7   Number of Visits 16   Date for SLP Re-Evaluation 05/05/15   SLP Start Time 1018   SLP Stop Time  1102   SLP Time Calculation (min) 44 min   Activity Tolerance Patient tolerated treatment well      Past Medical History  Diagnosis Date  . Hypertension     Past Surgical History  Procedure Laterality Date  . No past surgeries      There were no vitals filed for this visit.  Visit Diagnosis: Verbal apraxia  Expressive aphasia               SLP Education - 03/01/15 1051    Education provided Yes   Education Details HEP for apraxia - if you are not able to make the sound correct, stop and try later   Methods Explanation;Demonstration;Tactile cues;Verbal cues;Handout   Comprehension Verbalized understanding;Returned demonstration          SLP Short Term Goals - 03/01/15 1100    SLP SHORT TERM GOAL #1   Title pt to perform rote speech tasks (simultaneous with SLP/family) with 25% success, functional responses allowed   Time 3   Period Weeks  or 8 visits - for all STGs   Status Achieved   SLP SHORT TERM GOAL #2   Title pt will imitate consonant vowel syllables/words with written, picture, and other nonverbal cues, 60% success, functional responses allowed   Time 3   Period Weeks   Status Achieved   SLP SHORT TERM GOAL #3   Title pt will functionally communicate with simple augmentative/alternative device (via pictures/texting/typing) for basic wants/needs 60% success, in viable opportunities   Time 2   Period Weeks   Status On-going   SLP SHORT TERM GOAL #4    Title pt will demo understanding of simple conversational questions via yes/no responses at 80% accuracy   Time 2   Period Weeks   Status On-going   SLP SHORT TERM GOAL #5   Title pt will write name and address with rare nonverbal cues and no signs aphasia   Time 2   Period Weeks   Status On-going          SLP Long Term Goals - 03/01/15 1101    SLP LONG TERM GOAL #1   Title pt will demo undertanding of simple/mod complex conversational questions via yes/no responses 80% accuracy   Time 6   Period Weeks  or 16 visits - for all LTGs   Status On-going   SLP LONG TERM GOAL #2   Title pt will demo undertanding of simple conversational quesitons via yes/no responses 90% accuracy   Time 6   Period Weeks   Status On-going   SLP LONG TERM GOAL #3   Title pt will perform rote speech tasks simultaneously with SLP with 40% accuracy (functional responses allowed)   Time 6   Period Weeks   Status On-going   SLP LONG TERM GOAL #4   Title pt will imitate salient words/family names with 20% success   Time 6   Period Weeks   Status On-going  SLP LONG TERM GOAL #5   Title pt will demo use of simple alternative/augmentative communcation device or multimodal communication with 70% success in message conveyance   Time 6   Period Weeks   Status On-going          Plan - 03/01/15 1054    Clinical Impression Statement Continue skilled ST to maximize prodcution of cvcv syllable alternating dental, alveaor, gutteral phonemes and compensations for verbal apraxia. Pt also requiring cues to use gestures to augment verbal communication.    Speech Therapy Frequency 2x / week   Treatment/Interventions SLP instruction and feedback;Compensatory strategies;Internal/external aids;Environmental controls;Patient/family education;Functional tasks;Cueing hierarchy;Multimodal communcation approach   Potential to Achieve Goals Good   Potential Considerations Severity of impairments        Problem  List Patient Active Problem List   Diagnosis Date Noted  . 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 Right hemiparesis 10/21/2014  . Aphasia due to stroke 10/21/2014    Abril Cappiello, Radene Journey MS, CCC-SLP 03/01/2015, 11:05 AM  [facility] [address] [place], [place], [address] Phone: [phone]   Fax:  [phone]

## 2015-03-01 NOTE — Therapy (Signed)
[facility] [address] [place], [place], [address] Phone: [phone]   Fax:  [phone]  Physical Therapy Treatment  Patient Details  Name: Mario [name] MRN: [id number] Date of Birth: Feb 22, 1976 Referring Provider:  Rochel Brome, MD  Encounter Date: 03/01/2015      PT End of Session - 03/01/15 1404    Visit Number 8   Number of Visits 17   Date for PT Re-Evaluation 04/02/15   PT Start Time 1150   PT Stop Time 1231   PT Time Calculation (min) 41 min   Activity Tolerance Patient tolerated treatment well   Behavior During Therapy [facility] for tasks assessed/performed      Past Medical History  Diagnosis Date  . Hypertension     Past Surgical History  Procedure Laterality Date  . No past surgeries      There were no vitals filed for this visit.  Visit Diagnosis:  Abnormality of gait  Generalized weakness      Subjective Assessment - 03/01/15 1403    Subjective The patient answers questions to indicate that he is staying longer periods alone at home.   Currently in Pain? No/denies            [facility] Adult PT Treatment/Exercise - 03/01/15 1408    Ambulation/Gait   Ambulation/Gait Yes   Ambulation/Gait Assistance 5: Supervision   Ambulation/Gait Assistance Details with AFO for 3 minute walk test, without device or shoes walking on carpet to observe foot position   Ambulation Distance (Feet) 310 Feet  feet in 3 minute walk test without cane   Assistive device None   Gait Pattern Decreased arm swing - right;Step-through pattern;Decreased stance time - right;Decreased stride length;Decreased dorsiflexion - right;Decreased weight shift to right;Right circumduction;Right genu recurvatum;Lateral hip instability;Poor foot clearance - right   Ambulation Surface Level;Indoor   Stairs Yes   Stairs Assistance 6: Modified independent (Device/Increase time)   Stairs Assistance Details (indicate cue type and  reason) 4 steps x 1 with one rail and step to pattern, CGA with cues knee control with reciprocal pattern one handrail   Stair Management Technique One rail Left   Neuro Re-ed    Neuro Re-ed Details  Tall kneeling to 1/2 kneeling R and L sides with UE support through sturdy plinth (L UE held R UE on mat) x 5 reps each side, Then 1/2 knee to laterally extending on LE to tap to lateral target with trunk cues and CGA for safety, heel sit to tall kneeling x 5 reps, 1/2 kneeling to stand with L UE only on support surface x 5 reps with R LE for co-contraction. PNF D1 and D2 patterns with manual resistance x 10 reps in left sidelying for R LE.   Exercises   Exercises Other Exercises   Other Exercises  heel cord stretch standing with UE support, lunge + holds in order to maintain R knee control with min A for support.  R LE isolated exercise with tapping over muscles to facilitate contraction for dorsiflexion, toe flexion/extension with tennis ball.  Attempted to isolate ankle evertors--patient unable at this time.                  PT Short Term Goals - 03/01/15 1231    PT SHORT TERM GOAL #1   Title Pt will improve BERG balance score to 56/56 to decrease fall risk and improve functional mobility. (Target 03/03/15)   Baseline 50/56 on 8/31   Time 4  Period Weeks   PT SHORT TERM GOAL #2   Title Pt will improve gait speed to 2.83 ft/sec to improve functional ambulation level to community ambulator.   (Target 03/03/15)   Baseline 2.01 on 8/31   Time 4   Period Weeks   PT SHORT TERM GOAL #3   Title Pt will ambulate >500' on indoor and outdoor surfaces including grass, inclines and curb step without AD with AFO at S level without overt LOB to indicate safe negotiation of indoor and outdoor surfaces.  (Target 03/03/15)   Baseline 4   Period Weeks   PT SHORT TERM GOAL #4   Title Pt will negotiate up/down 4 steps in step to pattern with single rail at mod I level with safe technique to traverse  community obstacles.   (Target 03/03/15)   Baseline Met on 9/22.   Time 4   Period Weeks   Status Achieved   PT SHORT TERM GOAL #5   Title Pt will improve 3MWT to 27' to improve ambulation function and functional endurance.    Baseline 441' on 8/31   Time 4   Period Weeks           PT Long Term Goals - 02/11/15 1633    PT LONG TERM GOAL #1   Title Pts SIS mobility score will improve to 80% to indicate improved perceived functional mobility.  (Target 03/31/15)   Baseline 66.7% on 8/31   Time 8   Period Weeks   PT LONG TERM GOAL #2   Title Pt will improve ankle DF strength to 3/5 in order to increase safety with household ambulation and begin to address ambulation without use of AFO.  (Target 03/31/15)   Baseline 1/5 on 8/31   Time 8   Period Weeks   PT LONG TERM GOAL #3   Title Pt will verbalize ability to return to leisure activities to indicate return to community function.   (Target 03/31/15)   Time 8   Period Weeks   PT LONG TERM GOAL #4   Title Pt will negotiate up/down 4 steps without rail in alternating fashion to traverse community stairs while carrying object.  (Target 03/31/15)   Time 8   Period Weeks   PT LONG TERM GOAL #5   Title Pt will demonstrate ability to ambulate up to 150' with decreased R genu recurvatum to improve R knee stability/strength and prevent knee injury.     Baseline 8   Period Weeks   PT LONG TERM GOAL #6   Title Pt will demonstrate ability to perform SLS x 5 secs on RLE to improve balance and awareness of RLE.    Time 8   Period Weeks               Plan - 03/01/15 1405    Clinical Impression Statement The patient was slower today on 3 minute walk test without cane using AFO than at evaluation.  PT to check again with device at next session.  The patient has difficulty isolating R ankle movements for gait and functional mobility.  PT emphasized R LE motor control during session.   PT Next Visit Plan R knee stability (without AFO),  stretching HC (ensure that father assisting with this at home), PNF to decrease tone, isolated hip/knee flex, RLE WB. CONTINUE CHECKING STGs.   Consulted and Agree with Plan of Care Patient        Problem List Patient Active Problem List   Diagnosis Date Noted  .  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 Right hemiparesis 10/21/2014  . Aphasia due to stroke 10/21/2014    WEAVER,CHRISTINA, PT 03/01/2015, 2:19 PM  [facility] [address] [place], [place], [address] Phone: [phone]   Fax:  [phone]
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
## 2015-03-01 NOTE — Patient Instructions (Signed)
  Top Tape Type Tip TeePee Tap  Tube Tab Tub  Tack Take Tick Talk Toke Teak Teal  Bunny teeth, blow air "FFFF"  Feel Few Fuel Fab Five Fib Fat  Bunny Teeth - blow with voice "VVV"  Vine   [address]

## 2015-03-04 ENCOUNTER — Ambulatory Visit: Payer: BC Managed Care – PPO | Admitting: Rehabilitation

## 2015-03-04 ENCOUNTER — Ambulatory Visit: Payer: BC Managed Care – PPO | Admitting: Occupational Therapy

## 2015-03-04 ENCOUNTER — Ambulatory Visit: Payer: BC Managed Care – PPO

## 2015-03-04 ENCOUNTER — Encounter: Payer: Self-pay | Admitting: Rehabilitation

## 2015-03-04 ENCOUNTER — Encounter: Payer: Self-pay | Admitting: Occupational Therapy

## 2015-03-04 DIAGNOSIS — M6281 Muscle weakness (generalized): Secondary | ICD-10-CM

## 2015-03-04 DIAGNOSIS — R2681 Unsteadiness on feet: Secondary | ICD-10-CM

## 2015-03-04 DIAGNOSIS — R201 Hypoesthesia of skin: Secondary | ICD-10-CM

## 2015-03-04 DIAGNOSIS — R4701 Aphasia: Secondary | ICD-10-CM

## 2015-03-04 DIAGNOSIS — G8111 Spastic hemiplegia affecting right dominant side: Secondary | ICD-10-CM | POA: Diagnosis not present

## 2015-03-04 DIAGNOSIS — R482 Apraxia: Secondary | ICD-10-CM

## 2015-03-04 DIAGNOSIS — IMO0002 Reserved for concepts with insufficient information to code with codable children: Secondary | ICD-10-CM

## 2015-03-04 DIAGNOSIS — R269 Unspecified abnormalities of gait and mobility: Secondary | ICD-10-CM

## 2015-03-04 DIAGNOSIS — R4189 Other symptoms and signs involving cognitive functions and awareness: Secondary | ICD-10-CM

## 2015-03-04 NOTE — Therapy (Signed)
[facility] [address] [place], [place], [address] Phone: [phone]   Fax:  [phone]  Physical Therapy Treatment  Patient Details  Name: Mario [name] MRN: [id number] Date of Birth: 14-Apr-1976 Referring Provider:  Rochel Brome, MD  Encounter Date: 03/04/2015      PT End of Session - 03/04/15 1122    Visit Number 9   Number of Visits 17   Date for PT Re-Evaluation 04/02/15   PT Start Time 1027   PT Stop Time 1100   PT Time Calculation (min) 45 min   Activity Tolerance Patient tolerated treatment well   Behavior During Therapy [facility] for tasks assessed/performed      Past Medical History  Diagnosis Date  . Hypertension     Past Surgical History  Procedure Laterality Date  . No past surgeries      There were no vitals filed for this visit.  Visit Diagnosis:  Unsteadiness  Abnormality of gait  Weakness due to cerebrovascular accident      Subjective Assessment - 03/04/15 1015    Subjective Pt states "no" when asked about pain.  Also states "no" when asked if any changes since last visit.     Limitations Walking;House hold activities;Reading;Standing;Writing   Patient Stated Goals Improve speech, be able to walk better, and get functional use of RUE.    Currently in Pain? No/denies                         [facility] Adult PT Treatment/Exercise - 03/04/15 1111    Ambulation/Gait   Ambulation/Gait Yes   Ambulation/Gait Assistance 5: Supervision   Ambulation Distance (Feet) 600 Feet  600 outdoors, 366' w/ 3MWT   Assistive device None;Straight cane   Gait Pattern Decreased arm swing - right;Step-through pattern;Decreased stance time - right;Decreased stride length;Decreased dorsiflexion - right;Decreased weight shift to right;Right circumduction;Right genu recurvatum;Lateral hip instability;Poor foot clearance - right   Ambulation Surface Level;Unlevel;Indoor;Outdoor;Paved;Grass   inclines   Gait velocity 2.00 ft/sec    Curb 5: Supervision   Curb Details (indicate cue type and reason) no AD   Neuro Re-ed    Neuro Re-ed Details  Performed standing stepping task with use of mirror for visual feedback and yard stick as target to step over in attempts to further isolate hip/knee flexion during swing phase of gait.  Performed this without AFO.  Provided facilitation at trunk and inferior border of scapular to encourage scapular elevation and decreased trunk rotation and R lateral flexion.   Upon doing this, note difficulty with placement of R LE, therefore transitioned hands to provide facilitation at R knee and at foot to encourage knee flexion and neutral foot vs increased supination.  Pt does well in exercise, however does not seem to carryover due to increasing tone noted.     Exercises   Exercises Other Exercises   Other Exercises  heel cord stretch standing with UE support x 60 secs.  Provided education to perform stretch in this manner at home with father to ensure foot placement.                 PT Education - 03/04/15 1122    Education provided Yes   Education Details Education to use cane when outside for increased safety with S from father.    Person(s) Educated Patient   Methods Explanation   Comprehension Verbalized understanding          PT Short  Term Goals - 03/04/15 1035    PT SHORT TERM GOAL #1   Title Pt will improve BERG balance score to 56/56 to decrease fall risk and improve functional mobility. (Target 03/03/15)   Baseline 55/56 on 03/04/15   Status Not Met   PT SHORT TERM GOAL #2   Title Pt will improve gait speed to 2.83 ft/sec to improve functional ambulation level to community ambulator.   (Target 03/03/15)   Baseline 2.00 ft/sec on 03/04/15, feel that this is due to increased tone.    Status Not Met   PT SHORT TERM GOAL #3   Title Pt will ambulate >500' on indoor and outdoor surfaces including grass, inclines and curb step without  AD with AFO at S level without overt LOB to indicate safe negotiation of indoor and outdoor surfaces.  (Target 03/03/15)   Baseline met on 03/03/15, however recommend use of cane at home for increased safety and to decrease tone with gait.     Status Achieved   PT SHORT TERM GOAL #4   Title Pt will negotiate up/down 4 steps in step to pattern with single rail at mod I level with safe technique to traverse community obstacles.   (Target 03/03/15)   Baseline Met on 9/22.   Status Achieved   PT SHORT TERM GOAL #5   Title Pt will improve 3MWT to 498' to improve ambulation function and functional endurance.    Baseline 366' on 03/03/15   Status Not Met           PT Long Term Goals - 02/11/15 1633    PT LONG TERM GOAL #1   Title Pts SIS mobility score will improve to 80% to indicate improved perceived functional mobility.  (Target 03/31/15)   Baseline 66.7% on 8/31   Time 8   Period Weeks   PT LONG TERM GOAL #2   Title Pt will improve ankle DF strength to 3/5 in order to increase safety with household ambulation and begin to address ambulation without use of AFO.  (Target 03/31/15)   Baseline 1/5 on 8/31   Time 8   Period Weeks   PT LONG TERM GOAL #3   Title Pt will verbalize ability to return to leisure activities to indicate return to community function.   (Target 03/31/15)   Time 8   Period Weeks   PT LONG TERM GOAL #4   Title Pt will negotiate up/down 4 steps without rail in alternating fashion to traverse community stairs while carrying object.  (Target 03/31/15)   Time 8   Period Weeks   PT LONG TERM GOAL #5   Title Pt will demonstrate ability to ambulate up to 150' with decreased R genu recurvatum to improve R knee stability/strength and prevent knee injury.     Baseline 8   Period Weeks   PT LONG TERM GOAL #6   Title Pt will demonstrate ability to perform SLS x 5 secs on RLE to improve balance and awareness of RLE.    Time 8   Period Weeks               Plan -  03/04/15 1132    Clinical Impression Statement Skilled session focused on assessment of STG's.  Pt has met 2/5 STGs during the first four weeks of OP therapy.  Feel that he did not meet gait speed or 3MWT distance goals due to increasing tone in RLE duirng swing phase of gait.  Did not meet BERG balance test  score due to needing increased time for completion of stepping task.  Will look to focus on more isolated movements of trunk and scapula in order to translate to better positioning of RLE during gait.  May also need to add toe cap to new shoe (had one on previous shoe, however since getting new pair of shoes, does not have) in order to provide increased clearance.  Discussed this with pt and note that he was somewhat frustrated about not meeting goals but has great potential to continue to progress and meet all LTG's.   Continue POC.    Pt will benefit from skilled therapeutic intervention in order to improve on the following deficits Abnormal gait;Decreased balance;Decreased coordination;Decreased knowledge of precautions;Decreased safety awareness;Decreased strength;Difficulty walking;Impaired perceived functional ability;Impaired sensation;Impaired flexibility;Impaired tone;Impaired UE functional use;Postural dysfunction;Decreased knowledge of use of DME   Rehab Potential Excellent   Clinical Impairments Affecting Rehab Potential adhering to safety precautions   PT Frequency 2x / week   PT Duration 8 weeks   PT Treatment/Interventions ADLs/Self Care Home Management;Electrical Stimulation;Biofeedback;DME Instruction;Gait training;Stair training;Functional mobility training;Therapeutic exercise;Balance training;Neuromuscular re-education;Patient/family education;Manual techniques;Visual/perceptual remediation/compensation   PT Next Visit Plan R knee stability (without AFO), stretching HC, PNF to decrease tone, isolated hip/knee flex with arms in shoulder flexed/elbows extended position to decrease  retraction on R side, RLE WB.   Consulted and Agree with Plan of Care Patient        Problem List Patient Active Problem List   Diagnosis Date Noted  . 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 Right hemiparesis 10/21/2014  . Aphasia due to stroke 10/21/2014    Cameron Sprang, PT, MPT [facility] [address] [place], [place], [address] Phone: [phone]   Fax:  [phone] 03/04/2015, 11:44 AM

## 2015-03-04 NOTE — Therapy (Signed)
[facility] [address] [place], [place], [address] Phone: [phone]   Fax:  [phone]  Speech Language Pathology Treatment  Patient Details  Name: Mario [name] MRN: [id number] Date of Birth: 01-15-1976 Referring Provider:  Blane Ohara, MD  Encounter Date: 03/04/2015      End of Session - 03/04/15 1706    Visit Number 8   Number of Visits 16   Date for SLP Re-Evaluation 05/05/15   SLP Start Time 0934   SLP Stop Time  1016   SLP Time Calculation (min) 42 min   Activity Tolerance Patient tolerated treatment well      Past Medical History  Diagnosis Date  . Hypertension     Past Surgical History  Procedure Laterality Date  . No past surgeries      There were no vitals filed for this visit.  Visit Diagnosis: Verbal apraxia  Expressive aphasia  Receptive aphasia      Subjective Assessment - 03/04/15 0937    Subjective Pt responded incorrectly when SLP asked him what therapy he came from to ST.               ADULT SLP TREATMENT - 03/04/15 0937    General Information   Behavior/Cognition Alert;Cooperative;Pleasant mood   Treatment Provided   Treatment provided Cognitive-Linquistic   Pain Assessment   Pain Assessment No/denies pain   Cognitive-Linquistic Treatment   Treatment focused on Apraxia;Aphasia   Skilled Treatment Consonant vowel words produced today with SLP facilitating correct production; /b/ initial -  simultaneous 90%, spontaneous 75%. /p/ initial - simultaneous 15%, spontaneous 0%, /p/ phoneme 50% simultaneous. /m/ initial - simultaneous 60%, spontaneous 45%. B substitution for P and M predominant. Pt wrote name with self correction rarely, address with mod cues usually. Practice with address improved pt's accuracy with less aphasia.    Assessment / Recommendations / Plan   Plan Continue with current plan of care   Progression Toward Goals   Progression toward goals  Progressing toward goals            SLP Short Term Goals - 03/04/15 1709    SLP SHORT TERM GOAL #1   Title pt to perform rote speech tasks (simultaneous with SLP/family) with 25% success, functional responses allowed   Time 1   Period Weeks  or 8 visits - for all STGs   Status Achieved   SLP SHORT TERM GOAL #2   Title pt will imitate consonant vowel syllables/words with written, picture, and other nonverbal cues, 60% success, functional responses allowed   Time 1   Period Weeks   Status Achieved   SLP SHORT TERM GOAL #3   Title pt will functionally communicate with simple augmentative/alternative device (via pictures/texting/typing) for basic wants/needs 60% success, in viable opportunities   Time 1   Period Weeks   Status On-going   SLP SHORT TERM GOAL #4   Title pt will demo understanding of simple conversational questions via yes/no responses at 80% accuracy   Time 1   Period Weeks   Status On-going   SLP SHORT TERM GOAL #5   Title pt will write name and address with rare nonverbal cues and no signs aphasia   Time 1   Period Weeks   Status On-going          SLP Long Term Goals - 03/04/15 1714    SLP LONG TERM GOAL #1   Title pt will demo undertanding of simple/mod complex conversational  questions via yes/no responses 80% accuracy   Time 5   Period Weeks  or 16 visits - for all LTGs   Status On-going   SLP LONG TERM GOAL #2   Title pt will demo undertanding of simple conversational quesitons via yes/no responses 90% accuracy   Time 5   Period Weeks   Status On-going   SLP LONG TERM GOAL #3   Title pt will perform rote speech tasks simultaneously with SLP with 40% accuracy (functional responses allowed)   Time 5   Period Weeks   Status On-going   SLP LONG TERM GOAL #4   Title pt will imitate salient words/family names with 20% success   Time 5   Period Weeks   Status On-going   SLP LONG TERM GOAL #5   Title pt will demo use of simple  alternative/augmentative communcation device or multimodal communication with 70% success in message conveyance   Time 5   Period Weeks   Status On-going          Plan - 03/04/15 1707    Clinical Impression Statement Continue skilled ST to maximize prodcution of consonant-vowel (cv) and cvcv syllables and compensations for verbal apraxia. Pt also cont to require cues to use gestures to augment verbal communication.    Speech Therapy Frequency 2x / week   Duration --  5 weeks   Treatment/Interventions SLP instruction and feedback;Compensatory strategies;Internal/external aids;Environmental controls;Patient/family education;Functional tasks;Cueing hierarchy;Multimodal communcation approach   Potential to Achieve Goals Good   Potential Considerations Severity of impairments        Problem List Patient Active Problem List   Diagnosis Date Noted  . 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 Right hemiparesis 10/21/2014  . Aphasia due to stroke 10/21/2014    [facility] , MS, CCC-SLP  03/04/2015, 5:14 PM  [facility] [address] [place], [place], [address] Phone: [phone]   Fax:  [phone]

## 2015-03-04 NOTE — Patient Instructions (Signed)
Stretching exercises for your arm at home:  1. Lay on your back.  Put your hands behind your head with elbows pointing to the ceiling. Keeping your hands behind your head, slowly lower your elbows toward the bed as far as you can go. This should cause a stretch but should NOT be painful. Do 10 in the am and 10 in the evening.  2. Stand next to a corner of a wall keeping the wall on your right side.   Have your dad help you place your hand flat on the wall at hip level. Keeping hand flat and elbow straight (your dad will need to help stabilize your hand on the wall) slowly turn your body toward the left.  This should cause a stretch but should NOT be painful.  Do 10 in the morning and 10 in the evening. You can also use this through the day if your shoulder feels tight.

## 2015-03-04 NOTE — Patient Instructions (Signed)
Practice writing your address on the papers provided  Practice the "p" sound. We will practice the "p" words in therapy. You can practice the "m" and "b" words at home.

## 2015-03-04 NOTE — Therapy (Signed)
[facility] [address] [place], [place], [address] Phone: [phone]   Fax:  [phone]  Occupational Therapy Treatment  Patient Details  Name: Mario [name] MRN: [id number] Date of Birth: Feb 24, 1976 Referring Provider:  Blane Ohara, MD  Encounter Date: 03/04/2015      OT End of Session - 03/04/15 1145    Visit Number 9   Number of Visits 16   Date for OT Re-Evaluation 04/01/15   Authorization Type BCBS state health PPO no visit limit   OT Start Time 0845   OT Stop Time 0931   OT Time Calculation (min) 46 min   Activity Tolerance Patient tolerated treatment well      Past Medical History  Diagnosis Date  . Hypertension     Past Surgical History  Procedure Laterality Date  . No past surgeries      There were no vitals filed for this visit.  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 No/denies                      OT Treatments/Exercises (OP) - 03/04/15 0001    Neurological Re-education Exercises   Other Exercises 1 Pt with c/o that R arm felt very tight - pt with continued increasing tone in RUE, trunk and RLE.  Pt with siginficantly active pec today causing internal rotation and elbow flexion - addressed decreasing tone, increasing length and flexibility and increasing active isolated movement into length - see pt instruction for details of activity given to pt at home to facilitate carry over.    Other Exercises 2 Neuro re ed to addres active isolated grasp and release with  emphasis on grading force in effort to control abnormal tone and active relaxation prior to release .Pt able to actively extends fingers partial range 3 times to  grasp and let go of object before tone became barrier to active extension.   Modalities   Modalities Insurance account manager Location R fingers and wrist   Electrical Stimulation Action finger and wrist extension on R hand   Electrical Stimulation Parameters 50 pps, 250 0pulsed with intensity of 13 x10 minutes. Pt tolerated well. Followed by neuro re ed to activate into extension.                OT Education - 03/04/15 1141    Education provided Yes   Education Details stretching program to R shoulder   Person(s) Educated Patient   Methods Explanation;Demonstration;Tactile cues;Verbal cues;Handout   Comprehension Verbalized understanding;Returned demonstration;Verbal cues required  will revisit next session. Attempted to show dad however he had left the buildng          OT Short Term Goals - 03/04/15 1142    OT SHORT TERM GOAL #1   Title Pt will  and family member will be mod I with HEP - 03/04/2015   Status Achieved   OT SHORT TERM GOAL #2   Title Pt will demonstrate ability to complete shoulder  flexion to 50* without eblow flexion in blilateral reach to work toward functional low reach with RUE.   Status Achieved   OT SHORT TERM GOAL #3   Title Pt will demonstrate understanding of AE for cutting and tying shoes   Status Achieved   OT SHORT TERM GOAL #4   Title Pt will demonstrate beginning isolated grasp in R hand in prepartion for RUE use in functional tasks.   Status Achieved   OT SHORT TERM GOAL #5   Title Pt will demonstrate ability to use RUE as stabilizer at least 50% of the time during basic ADL tasks.   Status Achieved  pt able to use as gross assist for some parts of ADL activity           OT Long Term Goals - 03/04/15 1142    OT LONG TERM GOAL  #1   Title Pt and family will be mod I with updated HEP - 04/01/2015   Status On-going   OT LONG TERM GOAL #2   Title Pt will demonstrate ability for alternating attention for familiar functional tasks (i.e. cooking task)   Status On-going   OT LONG TERM GOAL #3   Title Pt will demonstrate ability for low unilateral reach with min a  for hand grasp only during a functional task    Status On-going   OT LONG TERM GOAL #4   Title Pt will demonstrate ability to use RUE as gross assist for at least 50% of basic self care tasks.   Status On-going   OT LONG TERM GOAL #5   Title Pt will be able to grasp light cylindrical object with no more than min a.   Status On-going   OT LONG TERM GOAL #6   Title Pt will be able to cast a fisihing pole with RUE with no more than mod a.   Status On-going   OT LONG TERM GOAL #7   Title Pt will be mod I with hot meal prep   Status On-going               Plan - 03/04/15 1143    Clinical Impression Statement Pt is making excellent progress toward LTG's.  Pt with increasing tone in RUE, trunk and RLE. Will continue to monitor. Pt given additional stretching program and tone reduction techniuqes today   Pt will benefit from skilled therapeutic intervention in order to improve on the following deficits (Retired) Abnormal gait;Decreased balance;Decreased coordination;Decreased cognition;Decreased knowledge of use of DME;Decreased mobility;Decreased range of motion;Decreased strength;Increased edema;Impaired UE functional use;Impaired tone;Impaired sensation;Pain   Rehab Potential Good   Clinical Impairments Affecting Rehab Potential global aphasia, apraxia, impaired sensation   OT Frequency 2x / week   OT Duration 8 weeks   OT Treatment/Interventions Self-care/ADL training;Ultrasound;Moist Heat;Electrical Stimulation;DME and/or AE instruction;Neuromuscular education;Therapeutic exercise;Functional Mobility Training;Manual Therapy;Passive range of  motion;Splinting;Therapeutic activities;Balance training;Patient/family education;Cognitive remediation/compensation   Plan check stretching program from last session, estim, isolated finger and wirst extension, functional grasp and reelase, low reach   Consulted and Agree with Plan of Care Patient        Problem List Patient Active Problem List   Diagnosis Date Noted  . 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 Right hemiparesis 10/21/2014  . Aphasia due to stroke 10/21/2014    Norton Pastel, OTR/L 03/04/2015, 11:46 AM  [facility] [address] [place], [place], [address] Phone: [phone]  Fax:  [phone]    

## 2015-03-09 ENCOUNTER — Encounter: Payer: Self-pay | Admitting: Occupational Therapy

## 2015-03-09 ENCOUNTER — Ambulatory Visit: Payer: BC Managed Care – PPO | Admitting: Speech Pathology

## 2015-03-09 ENCOUNTER — Ambulatory Visit: Payer: BC Managed Care – PPO | Attending: Family Medicine | Admitting: Occupational Therapy

## 2015-03-09 DIAGNOSIS — R4701 Aphasia: Secondary | ICD-10-CM

## 2015-03-09 DIAGNOSIS — R269 Unspecified abnormalities of gait and mobility: Secondary | ICD-10-CM | POA: Insufficient documentation

## 2015-03-09 DIAGNOSIS — I69898 Other sequelae of other cerebrovascular disease: Secondary | ICD-10-CM | POA: Diagnosis present

## 2015-03-09 DIAGNOSIS — R2681 Unsteadiness on feet: Secondary | ICD-10-CM | POA: Insufficient documentation

## 2015-03-09 DIAGNOSIS — R482 Apraxia: Secondary | ICD-10-CM

## 2015-03-09 DIAGNOSIS — G8111 Spastic hemiplegia affecting right dominant side: Secondary | ICD-10-CM | POA: Insufficient documentation

## 2015-03-09 DIAGNOSIS — R201 Hypoesthesia of skin: Secondary | ICD-10-CM | POA: Diagnosis present

## 2015-03-09 DIAGNOSIS — R531 Weakness: Secondary | ICD-10-CM | POA: Insufficient documentation

## 2015-03-09 DIAGNOSIS — I69359 Hemiplegia and hemiparesis following cerebral infarction affecting unspecified side: Secondary | ICD-10-CM | POA: Diagnosis present

## 2015-03-09 DIAGNOSIS — IMO0002 Reserved for concepts with insufficient information to code with codable children: Secondary | ICD-10-CM

## 2015-03-09 DIAGNOSIS — M6281 Muscle weakness (generalized): Secondary | ICD-10-CM | POA: Insufficient documentation

## 2015-03-09 DIAGNOSIS — R4189 Other symptoms and signs involving cognitive functions and awareness: Secondary | ICD-10-CM | POA: Diagnosis present

## 2015-03-09 NOTE — Patient Instructions (Signed)
Continue practice sounds on previous homework sheets

## 2015-03-09 NOTE — Therapy (Signed)
[facility] [address] [place], [place], [address] Phone: [phone]   Fax:  [phone]  Occupational Therapy Treatment  Patient Details  Name: Mario [name] MRN: [id number] Date of Birth: 01-18-1976 Referring Provider:  Blane Ohara, MD  Encounter Date: 03/09/2015      OT End of Session - 03/09/15 1314    Visit Number 10   Number of Visits 16   Date for OT Re-Evaluation 04/01/15   Authorization Type BCBS state health PPO no visit limit   OT Start Time 1148   OT Stop Time 1230   OT Time Calculation (min) 42 min   Activity Tolerance Patient tolerated treatment well   Behavior During Therapy [facility] for tasks assessed/performed      Past Medical History  Diagnosis Date  . Hypertension     Past Surgical History  Procedure Laterality Date  . No past surgeries      There were no vitals filed for this visit.  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 No/denies   Pain Score 0-No pain                      OT Treatments/Exercises (OP) - 03/09/15 0001    Neurological Re-education Exercises   Other Exercises 1 Neuromuscular reeducation to address elbow extension with reaching, and forearm, wrist relationship  to orient hand to various tasks.  Patient with active shortening on right trunk - especially noted in gait.  Worked in sitting on active lengthening of right trunk.  Reviewed home stretching program issued last visit.  Patient needed subtle (minimal cueing ) to reproduce.                  OT Education - 03/09/15 1313    Education provided Yes   Education Details reviewed stretching program, distal control of forearm, wrist, hand   Person(s) Educated Patient   Methods Explanation;Demonstration;Tactile cues;Verbal cues   Comprehension Returned demonstration;Tactile cues required;Need further instruction          OT Short Term Goals - 03/04/15 1142    OT SHORT TERM GOAL #1   Title Pt will  and family member will be mod I with HEP - 03/04/2015   Status Achieved   OT SHORT TERM GOAL #2   Title Pt will demonstrate ability to complete shoulder flexion to 50* without eblow flexion in blilateral reach to work toward functional low reach with RUE.   Status Achieved   OT SHORT TERM GOAL #3   Title Pt will demonstrate understanding of AE for cutting and tying shoes   Status Achieved   OT SHORT TERM GOAL #4   Title Pt will demonstrate beginning isolated grasp in R hand in prepartion for RUE use in functional tasks.   Status Achieved   OT SHORT TERM GOAL #5   Title Pt will demonstrate ability to use RUE as stabilizer at least 50% of the time during basic ADL tasks.   Status Achieved  pt able to use as gross assist for some parts of ADL activity  OT Long Term Goals - 03/04/15 1142    OT LONG TERM GOAL #1   Title Pt and family will be mod I with updated HEP - 04/01/2015   Status On-going   OT LONG TERM GOAL #2   Title Pt will demonstrate ability for alternating attention for familiar functional tasks (i.e. cooking task)   Status On-going   OT LONG TERM GOAL #3   Title Pt will demonstrate ability for low unilateral reach with min a  for hand grasp only during a functional task    Status On-going   OT LONG TERM GOAL #4   Title Pt will demonstrate ability to use RUE as gross assist for at least 50% of basic self care tasks.   Status On-going   OT LONG TERM GOAL #5   Title Pt will be able to grasp light cylindrical object with no more than min a.   Status On-going   OT LONG TERM GOAL #6   Title Pt will be able to cast a  fisihing pole with RUE with no more than mod a.   Status On-going   OT LONG TERM GOAL #7   Title Pt will be mod I with hot meal prep   Status On-going               Plan - 03/09/15 1314    Clinical Impression Statement Patient is making excellent progress toward long term goals.  Patient very motivated for functional use of right UE.     Pt will benefit from skilled therapeutic intervention in order to improve on the following deficits (Retired) Abnormal gait;Decreased balance;Decreased coordination;Decreased cognition;Decreased knowledge of use of DME;Decreased mobility;Decreased range of motion;Decreased strength;Increased edema;Impaired UE functional use;Impaired tone;Impaired sensation;Pain   Rehab Potential Good   Clinical Impairments Affecting Rehab Potential global aphasia, apraxia, impaired sensation   OT Frequency 2x / week   OT Duration 8 weeks   OT Treatment/Interventions Self-care/ADL training;Ultrasound;Moist Heat;Electrical Stimulation;DME and/or AE instruction;Neuromuscular education;Therapeutic exercise;Functional Mobility Training;Manual Therapy;Passive range of motion;Splinting;Therapeutic activities;Balance training;Patient/family education;Cognitive remediation/compensation   Plan NMR, functional grasp and release, low to mid level reach   Consulted and Agree with Plan of Care Patient        Problem List Patient Active Problem List   Diagnosis Date Noted  . 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 Right hemiparesis (HCC) 10/21/2014  . Aphasia due to stroke 10/21/2014    Collier Salina, OTR/L 03/09/2015, 1:16 PM  [facility] [address] [place], [place], [address] Phone: [phone]   Fax:  [phone]

## 2015-03-09 NOTE — Therapy (Signed)
[facility] [address] [place], [place], [address] Phone: [phone]   Fax:  [phone]  Speech Language Pathology Treatment  Patient Details  Name: Mario [name] MRN: [id number] Date of Birth: [birth year] Referring Provider:  Rochel Brome, MD  Encounter Date: 03/09/2015      End of Session - 03/09/15 1330    Visit Number 9   Number of Visits 16   Date for SLP Re-Evaluation 05/05/15   SLP Start Time 1233   SLP Stop Time  1316   SLP Time Calculation (min) 43 min   Activity Tolerance Patient tolerated treatment well      Past Medical History  Diagnosis Date  . Hypertension     Past Surgical History  Procedure Laterality Date  . No past surgeries      There were no vitals filed for this visit.  Visit Diagnosis: Expressive aphasia  Verbal apraxia      Subjective Assessment - 03/09/15 1249    Subjective Pt utilized writing to tell ST where is from successfully   Currently in Pain? No/denies               ADULT SLP TREATMENT - 03/09/15 1254    General Information   Behavior/Cognition Alert;Cooperative;Pleasant mood   Treatment Provided   Treatment provided Cognitive-Linquistic   Pain Assessment   Pain Assessment No/denies pain   Cognitive-Linquistic Treatment   Treatment focused on Apraxia;Aphasia   Skilled Treatment Pt utilized writing, gestures and pointing to items on internet (his work page) to communicate basic biographical , work and hobby information with usual  min to mod A, questioning to clarify . Pt answered questions re: family, nieces and nephews 70% accuracy and usual questioning cues to clarify.  Pt imitated /k/ cvc words/syllables with 60% accuracy and mod A.  Auditory comprehension of personal, biographical and family  questions 85% with rare min request for repeat. Auditory comprehension counting out coins for given amount 100%.   Assessment / Recommendations / Plan   Plan  Continue with current plan of care   Progression Toward Goals   Progression toward goals Progressing toward goals          SLP Education - 03/09/15 1326    Education provided Yes   Education Details use writng to augment speech   Person(s) Educated Patient   Methods Explanation;Demonstration;Verbal cues   Comprehension Verbalized understanding;Returned demonstration;Verbal cues required          SLP Short Term Goals - 03/09/15 1329    SLP SHORT TERM GOAL #1   Title pt to perform rote speech tasks (simultaneous with SLP/family) with 25% success, functional responses allowed   Time 1   Period Weeks  or 8 visits - for all STGs   Status Achieved   SLP SHORT TERM GOAL #2   Title pt will imitate consonant vowel syllables/words with written, picture, and other nonverbal cues, 60% success, functional responses allowed   Time 1   Period Weeks   Status Achieved   SLP SHORT TERM GOAL #3   Title pt will functionally communicate with simple augmentative/alternative device (via pictures/texting/typing) for basic wants/needs 60% success, in viable opportunities   Time 1   Period Weeks   Status Achieved   SLP SHORT TERM GOAL #4   Title pt will demo understanding of simple conversational questions via yes/no responses at 80% accuracy   Time 1   Period Weeks   Status Achieved   SLP SHORT TERM  GOAL #5   Title pt will write name and address with rare nonverbal cues and no signs aphasia   Time 1   Period Weeks   Status Not Met          SLP Long Term Goals - 03/09/15 1330    SLP LONG TERM GOAL #1   Title pt will demo undertanding of simple/mod complex conversational questions via yes/no responses 80% accuracy   Time 4   Period Weeks  or 16 visits - for all LTGs   Status On-going   SLP LONG TERM GOAL #2   Title pt will demo undertanding of simple conversational quesitons via yes/no responses 90% accuracy   Time 4   Period Weeks   Status On-going   SLP LONG TERM GOAL #3    Title pt will perform rote speech tasks simultaneously with SLP with 40% accuracy (functional responses allowed)   Time 4   Period Weeks   Status On-going   SLP LONG TERM GOAL #4   Title pt will imitate salient words/family names with 20% success   Time 4   Period Weeks   Status On-going   SLP LONG TERM GOAL #5   Title pt will demo use of simple alternative/augmentative communcation device or multimodal communication with 70% success in message conveyance   Time 4   Period Weeks   Status On-going          Plan - 03/09/15 1328    Clinical Impression Statement Continue skilled ST to maximize carryover of strategies to aument verbal expression and to maximize intellgibility of verbal expression at word level.    Speech Therapy Frequency 2x / week   Treatment/Interventions SLP instruction and feedback;Compensatory strategies;Internal/external aids;Environmental controls;Patient/family education;Functional tasks;Cueing hierarchy;Multimodal communcation approach   Potential to Achieve Goals Good   Potential Considerations Severity of impairments        Problem List Patient Active Problem List   Diagnosis Date Noted  . 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 Left middle cerebral artery stroke ([place]) 10/21/2014  . Right hemiparesis ([place]) 10/21/2014  . Aphasia due to stroke 10/21/2014    Nile Dorning, Annye Rusk MS, CCC-SLP 03/09/2015, 1:31 PM  [facility] [address] [place], [place], [address] Phone: [phone]   Fax:  [phone]

## 2015-03-11 ENCOUNTER — Encounter: Payer: Self-pay | Admitting: Occupational Therapy

## 2015-03-11 ENCOUNTER — Ambulatory Visit: Payer: BC Managed Care – PPO

## 2015-03-11 ENCOUNTER — Ambulatory Visit: Payer: BC Managed Care – PPO | Admitting: Occupational Therapy

## 2015-03-11 DIAGNOSIS — IMO0002 Reserved for concepts with insufficient information to code with codable children: Secondary | ICD-10-CM

## 2015-03-11 DIAGNOSIS — R482 Apraxia: Secondary | ICD-10-CM

## 2015-03-11 DIAGNOSIS — G8111 Spastic hemiplegia affecting right dominant side: Secondary | ICD-10-CM | POA: Diagnosis not present

## 2015-03-11 DIAGNOSIS — R4189 Other symptoms and signs involving cognitive functions and awareness: Secondary | ICD-10-CM

## 2015-03-11 DIAGNOSIS — M6281 Muscle weakness (generalized): Secondary | ICD-10-CM

## 2015-03-11 DIAGNOSIS — R201 Hypoesthesia of skin: Secondary | ICD-10-CM

## 2015-03-11 DIAGNOSIS — R4701 Aphasia: Secondary | ICD-10-CM

## 2015-03-11 NOTE — Therapy (Signed)
[facility] [address] [place], [place], [address] Phone: [phone]   Fax:  [phone]  Occupational Therapy Treatment  Patient Details  Name: Mario [name] MRN: [id number] Date of Birth: 09-Oct-1975 Referring Provider:  Blane Ohara, MD  Encounter Date: 03/11/2015      OT End of Session - 03/11/15 1709    Visit Number 11   Number of Visits 16   Date for OT Re-Evaluation 04/01/15   Authorization Type [employer] state health PPO no visit limit   OT Start Time 1315   OT Stop Time 1359   OT Time Calculation (min) 44 min   Activity Tolerance Patient tolerated treatment well      Past Medical History  Diagnosis Date  . Hypertension     Past Surgical History  Procedure Laterality Date  . No past surgeries      There were no vitals filed for this visit.  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    Pertinent History See epic snaphshot, L MCA CVA, HTN   Patient Stated Goals Per father, speech, RUE and walking to improve   Currently in Pain? No/denies                      OT Treatments/Exercises (OP) - 03/11/15 0001    Neurological Re-education Exercises   Other Exercises 1 Neuro re ed to address trunk control, scapular control in sitting, quadraped, standing and functional ambulation with emphasis on elongation and active conrol of R trunk, scapular stability then stable mobility at mid to high reach with resistance then closed chain activity with functional ambulation to facilitate active trunk and scapula.                  OT Short Term Goals - 03/11/15 1707    OT SHORT TERM GOAL #1   Title Pt will  and family  member will be mod I with HEP - 03/04/2015   Status Achieved   OT SHORT TERM GOAL #2   Title Pt will demonstrate ability to complete shoulder flexion to 50* without eblow flexion in blilateral reach to work toward functional low reach with RUE.   Status Achieved   OT SHORT TERM GOAL #3   Title Pt will demonstrate understanding of AE for cutting and tying shoes   Status Achieved   OT SHORT TERM GOAL #4   Title Pt will demonstrate beginning isolated grasp in R hand in prepartion for RUE use in functional tasks.   Status Achieved   OT SHORT TERM GOAL #5   Title Pt will demonstrate ability to use RUE as stabilizer at least 50% of the time during basic ADL tasks.   Status Achieved  pt able to use as gross assist for some parts of ADL activity           OT Long Term Goals - 03/11/15 1707    OT LONG TERM GOAL #1   Title Pt and family will be mod I with updated HEP - 04/01/2015   Status On-going   OT LONG TERM GOAL #2   Title Pt will demonstrate ability for alternating attention for familiar functional tasks (i.e. cooking task)  Status On-going   OT LONG TERM GOAL #3   Title Pt will demonstrate ability for low unilateral reach with min a  for hand grasp only during a functional task    Status On-going   OT LONG TERM GOAL #4   Title Pt will demonstrate ability to use RUE as gross assist for at least 50% of basic self care tasks.   Status On-going   OT LONG TERM GOAL #5   Title Pt will be able to grasp light cylindrical object with no more than min a.   Status On-going   OT LONG TERM GOAL #6   Title Pt will be able to cast a fisihing pole with RUE with no more than mod a.   Status On-going   OT LONG TERM GOAL #7   Title Pt will be mod I with hot meal prep   Status On-going               Plan - 03/11/15 1708    Clinical Impression Statement Pt continues to make excellent progress even within a single treatment session. Pt with improving trunk control, scapular control and  mid to beginning high level reach.    Pt will benefit from skilled therapeutic intervention in order to improve on the following deficits (Retired) Abnormal gait;Decreased balance;Decreased coordination;Decreased cognition;Decreased knowledge of use of DME;Decreased mobility;Decreased range of motion;Decreased strength;Increased edema;Impaired UE functional use;Impaired tone;Impaired sensation;Pain   Rehab Potential Good   Clinical Impairments Affecting Rehab Potential global aphasia, apraxia, impaired sensation   OT Frequency 2x / week   OT Duration 8 weeks   OT Treatment/Interventions Self-care/ADL training;Ultrasound;Moist Heat;Electrical Stimulation;DME and/or AE instruction;Neuromuscular education;Therapeutic exercise;Functional Mobility Training;Manual Therapy;Passive range of motion;Splinting;Therapeutic activities;Balance training;Patient/family education;Cognitive remediation/compensation   Plan MNR, functional grasp and release, mid to high level reach in closed chain activity as well as unilateral open ended low reach   Consulted and Agree with Plan of Care Patient        Problem List Patient Active Problem List   Diagnosis Date Noted  . 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 Right hemiparesis (HCC) 10/21/2014  . Aphasia due to stroke 10/21/2014    Norton Pastel, OTR/L 03/11/2015, 5:10 PM  [facility] [address] [place], [place], [address] Phone: [phone]   Fax:  [phone]

## 2015-03-11 NOTE — Therapy (Signed)
[facility] [address] [place], [place], [address] Phone: [phone]   Fax:  [phone]  Speech Language Pathology Treatment  Patient Details  Name: Mario [name] MRN: [id number] Date of Birth: 08/17/1975 Referring Provider:  Rochel Brome, MD  Encounter Date: 03/11/2015      End of Session - 03/11/15 1453    Visit Number 10   Number of Visits 16   Date for SLP Re-Evaluation 05/05/15   SLP Start Time 8828   SLP Stop Time  0034   SLP Time Calculation (min) 43 min   Activity Tolerance Patient tolerated treatment well      Past Medical History  Diagnosis Date  . Hypertension     Past Surgical History  Procedure Laterality Date  . No past surgeries      There were no vitals filed for this visit.  Visit Diagnosis: Expressive aphasia  Verbal apraxia  Receptive aphasia             ADULT SLP TREATMENT - 03/11/15 1408    General Information   Behavior/Cognition Alert;Cooperative;Pleasant mood   Treatment Provided   Treatment provided Cognitive-Linquistic   Pain Assessment   Pain Assessment No/denies pain   Cognitive-Linquistic Treatment   Treatment focused on Aphasia;Apraxia   Skilled Treatment SLP ensured pt understood he and SLP were working on how to compensate for decr'd verbal expression . SLP provided pt simple scenario (watch football, lost house keys, etc) and pt generated multimodal communication method for these with occasional min-mod A for ideas how to communicate messages. Initial /k/ consonant-vowel-consonant(CVC) words imitated with 75% success with tactile stimulation (pt put index finger at juncture of neck/chin and pushed upwards).   Assessment / Recommendations / Plan   Plan Continue with current plan of care   Progression Toward Goals   Progression toward goals Progressing toward goals          SLP Education - 03/11/15 1453    Education provided Yes   Education Details  multimodal communication   Person(s) Educated Patient   Methods Explanation;Demonstration   Comprehension Verbalized understanding;Verbal cues required          SLP Short Term Goals - 03/09/15 1329    SLP SHORT TERM GOAL #1   Title pt to perform rote speech tasks (simultaneous with SLP/family) with 25% success, functional responses allowed   Time 1   Period Weeks  or 8 visits - for all STGs   Status Achieved   SLP SHORT TERM GOAL #2   Title pt will imitate consonant vowel syllables/words with written, picture, and other nonverbal cues, 60% success, functional responses allowed   Time 1   Period Weeks   Status Achieved   SLP SHORT TERM GOAL #3   Title pt will functionally communicate with simple augmentative/alternative device (via pictures/texting/typing) for basic wants/needs 60% success, in viable opportunities   Time 1   Period Weeks   Status Achieved   SLP SHORT TERM GOAL #4   Title pt will demo understanding of simple conversational questions via yes/no responses at 80% accuracy   Time 1   Period Weeks   Status Achieved   SLP SHORT TERM GOAL #5   Title pt will write name and address with rare nonverbal cues and no signs aphasia   Time 1   Period Weeks   Status Not Met          SLP Long Term Goals - 03/11/15 1455    SLP  LONG TERM GOAL #1   Title pt will demo undertanding of simple/mod complex conversational questions via yes/no responses 80% accuracy   Time 4   Period Weeks  or 16 visits - for all LTGs   Status On-going   SLP LONG TERM GOAL #2   Title pt will demo undertanding of simple conversational quesitons via yes/no responses 90% accuracy   Time 4   Period Weeks   Status On-going   SLP LONG TERM GOAL #3   Title pt will perform rote speech tasks simultaneously with SLP with 40% accuracy (functional responses allowed)   Time 4   Period Weeks   Status On-going   SLP LONG TERM GOAL #4   Title pt will imitate salient words/family names with 20% success    Time 4   Period Weeks   Status On-going   SLP LONG TERM GOAL #5   Title pt will demo use of simple alternative/augmentative communcation device or multimodal communication with 70% success in message conveyance   Time 4   Period Weeks   Status On-going          Plan - 03/11/15 1454    Clinical Impression Statement Continue skilled ST to maximize carryover of strategies to aument verbal expression and to maximize intellgibility of verbal expression at word level.    Speech Therapy Frequency 2x / week   Duration 4 weeks   Treatment/Interventions SLP instruction and feedback;Compensatory strategies;Internal/external aids;Environmental controls;Patient/family education;Functional tasks;Cueing hierarchy;Multimodal communcation approach   Potential to Achieve Goals Good   Potential Considerations Severity of impairments        Problem List Patient Active Problem List   Diagnosis Date Noted  . 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 Left middle cerebral artery stroke ([place]) 10/21/2014  . Right hemiparesis ([place]) 10/21/2014  . Aphasia due to stroke 10/21/2014    [facility] , MS, CCC-SLP  03/11/2015, 2:56 PM  [facility] [address] [place], [place], [address] Phone: [phone]   Fax:  [phone]

## 2015-03-16 ENCOUNTER — Ambulatory Visit: Payer: BC Managed Care – PPO | Admitting: Speech Pathology

## 2015-03-16 ENCOUNTER — Ambulatory Visit: Payer: BC Managed Care – PPO | Admitting: Occupational Therapy

## 2015-03-16 ENCOUNTER — Encounter: Payer: Self-pay | Admitting: Occupational Therapy

## 2015-03-16 ENCOUNTER — Ambulatory Visit: Payer: BC Managed Care – PPO | Admitting: Physical Therapy

## 2015-03-16 DIAGNOSIS — R482 Apraxia: Secondary | ICD-10-CM

## 2015-03-16 DIAGNOSIS — IMO0002 Reserved for concepts with insufficient information to code with codable children: Secondary | ICD-10-CM

## 2015-03-16 DIAGNOSIS — R4701 Aphasia: Secondary | ICD-10-CM

## 2015-03-16 DIAGNOSIS — G8111 Spastic hemiplegia affecting right dominant side: Secondary | ICD-10-CM

## 2015-03-16 DIAGNOSIS — R201 Hypoesthesia of skin: Secondary | ICD-10-CM

## 2015-03-16 DIAGNOSIS — R4189 Other symptoms and signs involving cognitive functions and awareness: Secondary | ICD-10-CM

## 2015-03-16 NOTE — Therapy (Signed)
[facility] [address] [place], [place], [address] Phone: [phone]   Fax:  [phone]  Occupational Therapy Treatment  Patient Details  Name: Mario [name] MRN: [id number] Date of Birth: 1976/03/22 Referring Provider:  Blane Ohara, MD  Encounter Date: 03/16/2015      OT End of Session - 03/16/15 1245    Visit Number 12   Number of Visits 16   Date for OT Re-Evaluation 04/01/15   Authorization Type [employer] state health PPO no visit limit   OT Start Time 0845   OT Stop Time 0930   OT Time Calculation (min) 45 min   Activity Tolerance Patient tolerated treatment well      Past Medical History  Diagnosis Date  . Hypertension     Past Surgical History  Procedure Laterality Date  . No past surgeries      There were no vitals filed for this visit.  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    Pertinent History See epic snaphshot, L MCA CVA, HTN   Patient Stated Goals Per father, speech, RUE and walking to improve   Currently in Pain? No/denies                      OT Treatments/Exercises (OP) - 03/16/15 0001    Neurological Re-education Exercises   Other Exercises 1 Neuro re ed to address low unjilateral reach incorporating grasp and release into task - pt able to complete at approximately 30* of shoulder flexion and mod cues to over ride elbow flexion with reach. Pt improving with hand control in low reach midline activities. Tone impedes performance with higher reach.  Also addressed scapular stability in quadraped with LUE reaching activity  - with repetition and practice pt able to enetually complete with very little  assistance. Addressed scapular stability with resistance and scapular moble stability in sitting and standing.                   OT Short Term Goals - 03/16/15 1241    OT SHORT TERM GOAL #1   Title Pt will  and family member will be mod I with HEP - 03/04/2015   Status Achieved   OT SHORT TERM GOAL #2   Title Pt will demonstrate ability to complete shoulder flexion to 50* without eblow flexion in blilateral reach to work toward functional low reach with RUE.   Status Achieved   OT SHORT TERM GOAL #3   Title Pt will demonstrate understanding of AE for cutting and tying shoes   Status Achieved   OT SHORT TERM GOAL #4   Title Pt will demonstrate beginning isolated grasp in R hand in prepartion for RUE use in functional tasks.   Status Achieved   OT SHORT TERM GOAL #5   Title Pt will demonstrate ability to use RUE as stabilizer at least 50% of the time during basic ADL tasks.   Status Achieved  pt able to use as gross assist for some parts of ADL activity           OT Long Term Goals - 03/16/15 1241    OT LONG TERM GOAL #1   Title  Pt and family will be mod I with updated HEP - 04/01/2015   Status On-going   OT LONG TERM GOAL #2   Title Pt will demonstrate ability for alternating attention for familiar functional tasks (i.e. cooking task)   Status On-going   OT LONG TERM GOAL #3   Title Pt will demonstrate ability for low unilateral reach with min a  for hand grasp only during a functional task    Status Achieved   OT LONG TERM GOAL #4   Title Pt will demonstrate ability to use RUE as gross assist for at least 50% of basic self care tasks.   Status Achieved   OT LONG TERM GOAL #5   Title Pt will be able to grasp light cylindrical object with no more than min a.   Status On-going   OT LONG TERM GOAL #6   Title Pt will be able to cast a fisihing pole with RUE with no more than mod a.   Status On-going   OT LONG TERM GOAL #7   Title Pt will be mod I with hot meal prep    Status On-going               Plan - 03/16/15 1242    Clinical Impression Statement Pt continues to make great progress especially with low reach, grasp and release. Pt also improving in scapular stability and trunk control.  Will continue to monitor tone in RUE.   Pt will benefit from skilled therapeutic intervention in order to improve on the following deficits (Retired) Abnormal gait;Decreased balance;Decreased coordination;Decreased cognition;Decreased knowledge of use of DME;Decreased mobility;Decreased range of motion;Decreased strength;Increased edema;Impaired UE functional use;Impaired tone;Impaired sensation;Pain   Rehab Potential Good   Clinical Impairments Affecting Rehab Potential global aphasia, apraxia, impaired sensation   OT Frequency 2x / week   OT Duration 8 weeks   OT Treatment/Interventions Self-care/ADL training;Ultrasound;Moist Heat;Electrical Stimulation;DME and/or AE instruction;Neuromuscular education;Therapeutic exercise;Functional Mobility Training;Manual Therapy;Passive range of motion;Splinting;Therapeutic activities;Balance training;Patient/family education;Cognitive remediation/compensation   Plan NMR working toward mid unilateral reach, functional grasp and release with incorporation of wrist activity, mid to hgh bilteral reach, trunk control   Consulted and Agree with Plan of Care Patient        Problem List Patient Active Problem List   Diagnosis Date Noted  . 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 Right hemiparesis (HCC) 10/21/2014  . Aphasia due to stroke 10/21/2014    Norton Pastel, OTR/L 03/16/2015, 12:46 PM  [facility] [address] [place], [place], [address] Phone: [phone]   Fax:  [phone]

## 2015-03-16 NOTE — Patient Instructions (Addendum)
   Get Out  My  Dad  Whats  Up (start with mouth open)  Tee-na  CH (Quiet)  AMP

## 2015-03-16 NOTE — Therapy (Signed)
[facility] [address] [place], [place], [address] Phone: [phone]   Fax:  [phone]  Speech Language Pathology Treatment  Patient Details  Name: Mario [name] MRN: [id number] Date of Birth: 03/28/1976 Referring Provider:  Rochel Brome, MD  Encounter Date: 03/16/2015      End of Session - 03/16/15 1018    Visit Number 11   Number of Visits 16   Date for SLP Re-Evaluation 05/05/15   SLP Start Time 0932   SLP Stop Time  1016   SLP Time Calculation (min) 44 min   Activity Tolerance Patient tolerated treatment well      Past Medical History  Diagnosis Date  . Hypertension     Past Surgical History  Procedure Laterality Date  . No past surgeries      There were no vitals filed for this visit.  Visit Diagnosis: Expressive aphasia  Verbal apraxia      Subjective Assessment - 03/16/15 0935    Subjective Pt affrimed that he is writng successfully at home to communicate   Patient is accompained by: Family member   Currently in Pain? No/denies               ADULT SLP TREATMENT - 03/16/15 0001    General Information   Behavior/Cognition Alert;Cooperative;Pleasant mood   Treatment Provided   Treatment provided Cognitive-Linquistic   Pain Assessment   Pain Assessment No/denies pain   Cognitive-Linquistic Treatment   Treatment focused on Aphasia;Apraxia   Skilled Treatment Pt imitiated cvc and cvcv syllables with /k/ and /g/ with 80% accuracy, imitated simple phrase "get out" and "what's up" and "my dad" and "Tina"  Pt utlized writing to augment verbalizations with 80% accuracy for names, 70% for novel information (teams)            SLP Short Term Goals - 03/16/15 [place] #1   Title pt to perform rote speech tasks (simultaneous with SLP/family) with 25% success, functional responses allowed   Time 1   Period Weeks  or 8 visits - for all STGs   Status Achieved   SLP SHORT TERM GOAL #2   Title pt will imitate consonant vowel syllables/words with written, picture, and other nonverbal cues, 60% success, functional responses allowed   Time 1   Period Weeks   Status Achieved   SLP SHORT TERM GOAL #3   Title pt will functionally communicate with simple augmentative/alternative device (via pictures/texting/typing) for basic wants/needs 60% success, in viable opportunities   Time 1   Period Weeks   Status Achieved   SLP SHORT TERM GOAL #4   Title pt will demo understanding of simple conversational questions via yes/no responses at 80% accuracy   Time 1   Period Weeks   Status Achieved   SLP SHORT TERM GOAL #5   Title pt will write name and address with rare nonverbal cues and no signs aphasia   Time 1   Period Weeks   Status Not Met          SLP Long Term Goals - 03/16/15 1017    SLP LONG TERM GOAL #1   Title pt will demo undertanding of simple/mod complex conversational questions via yes/no responses 80% accuracy   Time 3   Period Weeks  or 16 visits - for all LTGs   Status On-going   SLP LONG TERM GOAL #2   Title pt will demo undertanding of simple conversational  quesitons via yes/no responses 90% accuracy   Time 3   Period Weeks   Status On-going   SLP LONG TERM GOAL #3   Title pt will perform rote speech tasks simultaneously with SLP with 40% accuracy (functional responses allowed)   Time 3   Period Weeks   Status On-going   SLP LONG TERM GOAL #4   Title pt will imitate salient words/family names with 20% success   Time 3   Period Weeks   Status On-going   SLP LONG TERM GOAL #5   Title pt will demo use of simple alternative/augmentative communcation device or multimodal communication with 70% success in message conveyance   Time 3   Period Weeks   Status On-going          Plan - 03/16/15 1016    Clinical Impression Statement Continue skilled ST to maximize word/simple phrase verbal expression and carryover of   augmentative communication   Speech Therapy Frequency 2x / week   Treatment/Interventions SLP instruction and feedback;Compensatory strategies;Internal/external aids;Environmental controls;Patient/family education;Functional tasks;Cueing hierarchy;Multimodal communcation approach   Potential to Achieve Goals Good   Potential Considerations Severity of impairments        Problem List Patient Active Problem List   Diagnosis Date Noted  . 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 Left middle cerebral artery stroke ([place]) 10/21/2014  . Right hemiparesis ([place]) 10/21/2014  . Aphasia due to stroke 10/21/2014     [place], Annye Rusk  MS, CCC-SLP  03/16/2015, 10:19 AM  [facility] [address] [place], [place], [address] Phone: [phone]   Fax:  [phone]

## 2015-03-18 ENCOUNTER — Ambulatory Visit: Payer: BC Managed Care – PPO | Admitting: Occupational Therapy

## 2015-03-18 ENCOUNTER — Ambulatory Visit: Payer: BC Managed Care – PPO | Admitting: Speech Pathology

## 2015-03-18 ENCOUNTER — Encounter: Payer: Self-pay | Admitting: Occupational Therapy

## 2015-03-18 ENCOUNTER — Ambulatory Visit: Payer: BC Managed Care – PPO | Admitting: Rehabilitation

## 2015-03-18 ENCOUNTER — Encounter: Payer: Self-pay | Admitting: Rehabilitation

## 2015-03-18 DIAGNOSIS — IMO0002 Reserved for concepts with insufficient information to code with codable children: Secondary | ICD-10-CM

## 2015-03-18 DIAGNOSIS — G8111 Spastic hemiplegia affecting right dominant side: Secondary | ICD-10-CM

## 2015-03-18 DIAGNOSIS — R2681 Unsteadiness on feet: Secondary | ICD-10-CM

## 2015-03-18 DIAGNOSIS — R482 Apraxia: Secondary | ICD-10-CM

## 2015-03-18 DIAGNOSIS — R269 Unspecified abnormalities of gait and mobility: Secondary | ICD-10-CM

## 2015-03-18 DIAGNOSIS — R201 Hypoesthesia of skin: Secondary | ICD-10-CM

## 2015-03-18 DIAGNOSIS — R4701 Aphasia: Secondary | ICD-10-CM

## 2015-03-18 DIAGNOSIS — R4189 Other symptoms and signs involving cognitive functions and awareness: Secondary | ICD-10-CM

## 2015-03-18 DIAGNOSIS — I69359 Hemiplegia and hemiparesis following cerebral infarction affecting unspecified side: Secondary | ICD-10-CM

## 2015-03-18 NOTE — Therapy (Signed)
[facility] [address] [place], [place], [address] Phone: [phone]   Fax:  [phone]  Physical Therapy Treatment  Patient Details  Name: Mario [name] MRN: [id number] Date of Birth: 23-Jul-1975 Referring Provider:  Rochel Brome, MD  Encounter Date: 03/18/2015      PT End of Session - 03/18/15 1204    Visit Number 10   Number of Visits 17   Date for PT Re-Evaluation 04/16/15  Date updated due to missing 2wks of PT   PT Start Time 1015   PT Stop Time 1100   PT Time Calculation (min) 45 min   Activity Tolerance Patient tolerated treatment well   Behavior During Therapy [facility] for tasks assessed/performed      Past Medical History  Diagnosis Date  . Hypertension     Past Surgical History  Procedure Laterality Date  . No past surgeries      There were no vitals filed for this visit.  Visit Diagnosis:  Abnormality of gait  Weakness due to cerebrovascular accident  Unsteadiness  Hemiparesis following cerebrovascular accident [facility])               NMR: Initiated session with supine R knee flex to continue to address isolated hamstring activation.  Performed x 2 reps of 10.  Addressed isolated R knee/hip flexion while also maintaining R scapular and trunk stabilization through WB in quadruped.  While in quadruped, performed stepping with RLE forwards and backwards.  Note pt did great maintaining straight plane of movement in RLE, however provided assist at R UE to maintain shoulder and elbow stability.  Progressed to moving RLE into flexion and extension without touch down in between.  Note during first 10 reps, pt with increased trunk compensatory movements, therefore provided cues for more stable trunk during transitional movements.   Performed 2 more sets of 5 with rest break between due to fatigue.  Progressed to standing task with BUEs holding physioball against wall (provided assist at RUE to  maintain position of hand on ball and at elbow for support in order to maintain adequate trunk and scapular alignment while stepping RLE forwards and backwards.  Continue to note that retro stepping more difficult and also tends to demonstrate increased R foot supination during swing phase both directions.  Performed 2 sets of 10 reps.  Ended session with trial of NMES on peroneals in order to facilitate more eversion during swing phase.  Pt then ambulated x 115' while holding ball (to maintain shoulder and trunk alignment as above) with NMES attached.  Note improvement when turned on, however continue to note decreased ankle DF, therefore would add channel for DF assist on next visit.  Pt verbalized understanding, no redness on skin when removed electrodes and tolerated well.                  PT Education - 03/18/15 1203    Education provided Yes   Education Details Provided education on purpose of NMES   Person(s) Educated Patient   Methods Explanation   Comprehension Verbalized understanding          PT Short Term Goals - 03/04/15 1035    PT SHORT TERM GOAL #1   Title Pt will improve BERG balance score to 56/56 to decrease fall risk and improve functional mobility. (Target 03/03/15)   Baseline 55/56 on 03/04/15   Status Not Met   PT SHORT TERM GOAL #2   Title Pt will improve gait  speed to 2.83 ft/sec to improve functional ambulation level to community ambulator.   (Target 03/03/15)   Baseline 2.00 ft/sec on 03/04/15, feel that this is due to increased tone.    Status Not Met   PT SHORT TERM GOAL #3   Title Pt will ambulate >500' on indoor and outdoor surfaces including grass, inclines and curb step without AD with AFO at S level without overt LOB to indicate safe negotiation of indoor and outdoor surfaces.  (Target 03/03/15)   Baseline met on 03/03/15, however recommend use of cane at home for increased safety and to decrease tone with gait.     Status Achieved   PT SHORT TERM GOAL  #4   Title Pt will negotiate up/down 4 steps in step to pattern with single rail at mod I level with safe technique to traverse community obstacles.   (Target 03/03/15)   Baseline Met on 9/22.   Status Achieved   PT SHORT TERM GOAL #5   Title Pt will improve 3MWT to 498' to improve ambulation function and functional endurance.    Baseline 366' on 03/03/15   Status Not Met           PT Long Term Goals - 03/18/15 1206    PT LONG TERM GOAL #1   Title Pts SIS mobility score will improve to 80% to indicate improved perceived functional mobility.  (Target 04/16/15)  Target dates updated due to missing 2 wks of PT   Baseline 66.7% on 8/31   Time 8   Period Weeks   PT LONG TERM GOAL #2   Title Pt will improve ankle DF strength to 3/5 in order to increase safety with household ambulation and begin to address ambulation without use of AFO.  (Target 04/16/15)   Baseline 1/5 on 8/31   Time 8   Period Weeks   PT LONG TERM GOAL #3   Title Pt will verbalize ability to return to leisure activities to indicate return to community function.   (Target 04/16/15)   Time 8   Period Weeks   PT LONG TERM GOAL #4   Title Pt will negotiate up/down 4 steps without rail in alternating fashion to traverse community stairs while carrying object.  (Target 04/16/15)   Time 8   Period Weeks   PT LONG TERM GOAL #5   Title Pt will demonstrate ability to ambulate up to 150' with decreased R genu recurvatum to improve R knee stability/strength and prevent knee injury.  (Target Date: 04/16/15)   Baseline 8   Period Weeks   PT LONG TERM GOAL #6   Title Pt will demonstrate ability to perform SLS x 5 secs on RLE to improve balance and awareness of RLE. (Target Date: 04/16/15)   Time 8   Period Weeks               Plan - 03/18/15 1204    Clinical Impression Statement Skilled session focused on NMR for isolated hip and knee flexion to carryover to gait while also maintaining proper postural and scapular  alignment to prevent shoulder depression and trunk rotation on the R.  Also trialed use of NMES on peroneals for increased eversion during swing phase of gait. Tolerated well.  Note that primary PT extended POC for 2 weeks as pt missed 2 weeks of therapy due to scheduling conflicts.     Pt will benefit from skilled therapeutic intervention in order to improve on the following deficits Abnormal gait;Decreased balance;Decreased coordination;Decreased knowledge  of precautions;Decreased safety awareness;Decreased strength;Difficulty walking;Impaired perceived functional ability;Impaired sensation;Impaired flexibility;Impaired tone;Impaired UE functional use;Postural dysfunction;Decreased knowledge of use of DME   Rehab Potential Excellent   Clinical Impairments Affecting Rehab Potential adhering to safety precautions   PT Frequency 2x / week   PT Duration 8 weeks   PT Treatment/Interventions ADLs/Self Care Home Management;Electrical Stimulation;Biofeedback;DME Instruction;Gait training;Stair training;Functional mobility training;Therapeutic exercise;Balance training;Neuromuscular re-education;Patient/family education;Manual techniques;Visual/perceptual remediation/compensation   PT Next Visit Plan R knee stability (without AFO), stretching HC, PNF to decrease tone, isolated hip/knee flex with arms in shoulder flexed/elbows extended position to decrease retraction on R side, RLE WB.   Consulted and Agree with Plan of Care Patient        Problem List Patient Active Problem List   Diagnosis Date Noted  . 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 Left middle cerebral artery stroke ([place]) 10/21/2014  . Right hemiparesis ([place]) 10/21/2014  . Aphasia due to stroke 10/21/2014     Cameron Sprang, PT, MPT [facility] [address] [place], [place], [address] Phone: [phone]   Fax:  [phone] 03/18/2015, 12:10 PM

## 2015-03-18 NOTE — Therapy (Signed)
[facility] [address] [place], [place], [address] Phone: [phone]   Fax:  [phone]  Occupational Therapy Treatment  Patient Details  Name: Mario [name] MRN: [id number] Date of Birth: [birth year] Referring Provider:  Blane Ohara, MD  Encounter Date: 03/18/2015      OT End of Session - 03/18/15 1113    Visit Number 13   Number of Visits 16   Date for OT Re-Evaluation 04/01/15   Authorization Type [employer] state health PPO no visit limit   OT Start Time 0845   OT Stop Time 0929   OT Time Calculation (min) 44 min   Activity Tolerance Patient tolerated treatment well      Past Medical History  Diagnosis Date  . Hypertension     Past Surgical History  Procedure Laterality Date  . No past surgeries      There were no vitals filed for this visit.  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 No/denies                      OT Treatments/Exercises (OP) - 03/18/15 0001    Neurological Re-education Exercises   Other Exercises 1 Following estim, neuro re ed to address initially grasping and releasing a cup, then a ball and then 2 point pinch to pick up and release one inch block.  Pt with great carry over after estim however tone interferes with repetition.  Pt for first time able to functionally use 2 point pinch with wrist support. Providing wrist support also decrease excessive effort which in turn reduces tone. Consider fabrication of wrist cock up splint.   Programme researcher, broadcasting/film/video Location R fingers and wrist   Electrical Stimulation Action 250 pw, 50 pps, intensity 13, x 10 minutes    Electrical Stimulation Goals Tone;Neuromuscular facilitation                  OT Short Term Goals - 03/18/15 1111    OT SHORT TERM GOAL #1   Title Pt will  and family member will be mod I with HEP - 03/04/2015   Status Achieved   OT SHORT TERM GOAL #2   Title Pt will demonstrate ability to complete shoulder flexion to 50* without eblow flexion in blilateral reach to work toward functional low reach with RUE.   Status Achieved   OT SHORT TERM GOAL #3   Title Pt will demonstrate understanding of AE for cutting and tying shoes   Status Achieved   OT SHORT TERM GOAL #4   Title Pt will demonstrate beginning isolated grasp in R hand in prepartion for RUE use in functional tasks.   Status Achieved   OT SHORT TERM GOAL #5   Title Pt will demonstrate ability to use RUE as stabilizer at least 50% of the time during basic ADL tasks.   Status Achieved  pt able to use as gross assist for some parts of ADL activity           OT Long Term Goals - 03/18/15 1111    OT  LONG TERM GOAL #1   Title Pt and family will be mod I with updated HEP - 04/01/2015   Status On-going   OT LONG TERM GOAL #2   Title Pt will demonstrate ability for alternating attention for familiar functional tasks (i.e. cooking task)   Status On-going   OT LONG TERM GOAL #3   Title Pt will demonstrate ability for low unilateral reach with min a  for hand grasp only during a functional task    Status Achieved   OT LONG TERM GOAL #4   Title Pt will demonstrate ability to use RUE as gross assist for at least 50% of basic self care tasks.   Status Achieved   OT LONG TERM GOAL #5   Title Pt will be able to grasp light cylindrical object with no more than min a.   Status Achieved   OT LONG TERM GOAL #6   Title Pt will be able to cast a fisihing pole with RUE with no more than mod a.   Status On-going   OT LONG TERM GOAL #7   Title Pt will be  mod I with hot meal prep   Status On-going               Plan - 03/18/15 1112    Clinical Impression Statement Pt continues to make significan progress and is exhibiting increased isolated hand movement. Pt using RUE more functionally at home as well.   Pt will benefit from skilled therapeutic intervention in order to improve on the following deficits (Retired) Abnormal gait;Decreased balance;Decreased coordination;Decreased cognition;Decreased knowledge of use of DME;Decreased mobility;Decreased range of motion;Decreased strength;Increased edema;Impaired UE functional use;Impaired tone;Impaired sensation;Pain   Rehab Potential Good   Clinical Impairments Affecting Rehab Potential global aphasia, apraxia, impaired sensation   OT Frequency 2x / week   OT Duration 8 weeks   OT Treatment/Interventions Self-care/ADL training;Ultrasound;Moist Heat;Electrical Stimulation;DME and/or AE instruction;Neuromuscular education;Therapeutic exercise;Functional Mobility Training;Manual Therapy;Passive range of motion;Splinting;Therapeutic activities;Balance training;Patient/family education;Cognitive remediation/compensation   Plan fabircate wrist cock up splint, NMR working toward mid reach, grasp and release and funcitonal use of R hand   Consulted and Agree with Plan of Care Patient        Problem List Patient Active Problem List   Diagnosis Date Noted  . 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 Right hemiparesis (HCC) 10/21/2014  . Aphasia due to stroke 10/21/2014    Norton Pastelulaski, Jatoria Kneeland Halliday, OTR/L 03/18/2015, 11:14 AM  [facility] [address] [place], [place], [address] Phone: [phone]   Fax:  [phone]

## 2015-03-18 NOTE — Patient Instructions (Signed)
  Go slow, in syllables  Ch     ip  Bar  Be  Que  Th - prolong, tongue out, blow air   Th (prolong quiet)  Th (prolong with voice)  Ca   Rra  Bas  Com  Bo

## 2015-03-18 NOTE — Therapy (Signed)
[facility] [address] [place], [place], [address] Phone: [phone]   Fax:  [phone]  Speech Language Pathology Treatment  Patient Details  Name: Mario [name] MRN: [id number] Date of Birth: 05-Aug-1975 Referring Provider:  Rochel Brome, MD  Encounter Date: 03/18/2015      End of Session - 03/18/15 1020    Visit Number 12   Number of Visits 16   Date for SLP Re-Evaluation 05/05/15   SLP Start Time 0933   SLP Stop Time  1017   SLP Time Calculation (min) 44 min      Past Medical History  Diagnosis Date  . Hypertension     Past Surgical History  Procedure Laterality Date  . No past surgeries      There were no vitals filed for this visit.  Visit Diagnosis: Expressive aphasia  Verbal apraxia      Subjective Assessment - 03/18/15 0953    Subjective Pt brought list of relevant words/phrases back to therapy as instructed   Currently in Pain? No/denies               ADULT SLP TREATMENT - 03/18/15 0957    General Information   Behavior/Cognition Alert;Cooperative;Pleasant mood   Treatment Provided   Treatment provided Cognitive-Linquistic   Pain Assessment   Pain Assessment No/denies pain   Cognitive-Linquistic Treatment   Treatment focused on Aphasia;Apraxia   Skilled Treatment Pt performed automatic counting much improved - 70% accurate with usual min cues/unison. Initiated /th/ and voiced /th/ syllables to today with freuquent modeling, unison,. Pt approximated answers to simple questions with 1 word answers  which ST understood 70% of  answers.    Assessment / Recommendations / Plan   Plan Continue with current plan of care          SLP Education - 03/18/15 1215    Education provided Yes   Education Details Use slow rate, syllables and prolonging sounds to improve intelligiblity          SLP Short Term Goals - 03/18/15 1020    SLP SHORT TERM GOAL #1   Title pt to perform  rote speech tasks (simultaneous with SLP/family) with 25% success, functional responses allowed   Time 1   Period Weeks  or 8 visits - for all STGs   Status Achieved   SLP SHORT TERM GOAL #2   Title pt will imitate consonant vowel syllables/words with written, picture, and other nonverbal cues, 60% success, functional responses allowed   Time 1   Period Weeks   Status Achieved   SLP SHORT TERM GOAL #3   Title pt will functionally communicate with simple augmentative/alternative device (via pictures/texting/typing) for basic wants/needs 60% success, in viable opportunities   Time 1   Period Weeks   Status Achieved   SLP SHORT TERM GOAL #4   Title pt will demo understanding of simple conversational questions via yes/no responses at 80% accuracy   Time 1   Period Weeks   Status Achieved   SLP SHORT TERM GOAL #5   Title pt will write name and address with rare nonverbal cues and no signs aphasia   Time 1   Period Weeks   Status Not Met          SLP Long Term Goals - 03/18/15 1020    SLP LONG TERM GOAL #1   Title pt will demo undertanding of simple/mod complex conversational questions via yes/no responses 80% accuracy   Time  3   Period Weeks  or 16 visits - for all LTGs   Status On-going   SLP LONG TERM GOAL #2   Title pt will demo undertanding of simple conversational quesitons via yes/no responses 90% accuracy   Time 3   Period Weeks   Status On-going   SLP LONG TERM GOAL #3   Title pt will perform rote speech tasks simultaneously with SLP with 40% accuracy (functional responses allowed)   Time 3   Period Weeks   Status On-going   SLP LONG TERM GOAL #4   Title pt will imitate salient words/family names with 20% success   Time 3   Period Weeks   Status On-going   SLP LONG TERM GOAL #5   Title pt will demo use of simple alternative/augmentative communcation device or multimodal communication with 70% success in message conveyance   Time 3   Period Weeks   Status  On-going          Plan - 03/18/15 1020    Clinical Impression Statement Continue skilled ST to maximize word/simple phrase verbal expression and carryover of  augmentative communication   Speech Therapy Frequency 2x / week   Duration 4 weeks   Treatment/Interventions SLP instruction and feedback;Compensatory strategies;Internal/external aids;Environmental controls;Patient/family education;Functional tasks;Cueing hierarchy;Multimodal communcation approach   Potential to Achieve Goals Good   Potential Considerations Severity of impairments        Problem List Patient Active Problem List   Diagnosis Date Noted  . 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 Right hemiparesis ([place]) 10/21/2014  . Aphasia due to stroke 10/21/2014    Walden Statz, Annye Rusk MS, CCC-SLP 03/18/2015, 12:17 PM  [facility] [address] [place], [place], [address] Phone: [phone]   Fax:  [phone]

## 2015-03-23 ENCOUNTER — Ambulatory Visit: Payer: BC Managed Care – PPO | Admitting: Speech Pathology

## 2015-03-23 ENCOUNTER — Encounter: Payer: Self-pay | Admitting: Occupational Therapy

## 2015-03-23 ENCOUNTER — Encounter: Payer: Self-pay | Admitting: Physical Therapy

## 2015-03-23 ENCOUNTER — Ambulatory Visit: Payer: BC Managed Care – PPO | Admitting: Occupational Therapy

## 2015-03-23 ENCOUNTER — Ambulatory Visit: Payer: BC Managed Care – PPO | Admitting: Physical Therapy

## 2015-03-23 DIAGNOSIS — M6281 Muscle weakness (generalized): Secondary | ICD-10-CM

## 2015-03-23 DIAGNOSIS — R482 Apraxia: Secondary | ICD-10-CM

## 2015-03-23 DIAGNOSIS — IMO0002 Reserved for concepts with insufficient information to code with codable children: Secondary | ICD-10-CM

## 2015-03-23 DIAGNOSIS — G8111 Spastic hemiplegia affecting right dominant side: Secondary | ICD-10-CM | POA: Diagnosis not present

## 2015-03-23 DIAGNOSIS — I69359 Hemiplegia and hemiparesis following cerebral infarction affecting unspecified side: Secondary | ICD-10-CM

## 2015-03-23 DIAGNOSIS — R201 Hypoesthesia of skin: Secondary | ICD-10-CM

## 2015-03-23 DIAGNOSIS — R2681 Unsteadiness on feet: Secondary | ICD-10-CM

## 2015-03-23 DIAGNOSIS — R4189 Other symptoms and signs involving cognitive functions and awareness: Secondary | ICD-10-CM

## 2015-03-23 DIAGNOSIS — R531 Weakness: Secondary | ICD-10-CM

## 2015-03-23 DIAGNOSIS — R4701 Aphasia: Secondary | ICD-10-CM

## 2015-03-23 DIAGNOSIS — R269 Unspecified abnormalities of gait and mobility: Secondary | ICD-10-CM

## 2015-03-23 NOTE — Therapy (Signed)
[facility] [address] [place], [place], [address] Phone: [phone]   Fax:  [phone]  Occupational Therapy Treatment  Patient Details  Name: Mario [name] MRN: [id number] Date of Birth: [birth year] No Data Recorded  Encounter Date: 03/23/2015      OT End of Session - 03/23/15 1249    Visit Number 14   Number of Visits 16   Date for OT Re-Evaluation 04/01/15   Authorization Type [employer] state health PPO no visit limit   OT Start Time 0845   OT Stop Time 0928   OT Time Calculation (min) 43 min   Activity Tolerance Patient tolerated treatment well      Past Medical History  Diagnosis Date  . Hypertension     Past Surgical History  Procedure Laterality Date  . No past surgeries      There were no vitals filed for this visit.  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 No/denies                      OT Treatments/Exercises (OP) - 03/23/15 0001    Splinting   Splinting fabricated wrist cock up splint for patient to faciliate pt's ability to reach and use hand functionally without wrist drop. Pt does have some active wrist extension however it is weak and requires maximum effort on pt's behalf which then results in increased flexor tone in hand. WIth cock up splint, pt able to begin to work on grasp, release and pinch and reach is completed with less abnormal compensations. Pt able to don and doff and reviewed wear and care with pt and dad.                 OT Education - 03/23/15 1246    Education provided Yes    Education Details Splint wear and care   Person(s) Educated Patient;Parent(s)   Methods Explanation;Demonstration;Verbal cues;Handout   Comprehension Verbalized understanding;Returned demonstration          OT Short Term Goals - 03/23/15 1246    OT SHORT TERM GOAL #1   Title Pt will  and family member will be mod I with HEP - 03/04/2015   Status Achieved   OT SHORT TERM GOAL #2   Title Pt will demonstrate ability to complete shoulder flexion to 50* without eblow flexion in blilateral reach to work toward functional low reach with RUE.   Status Achieved   OT SHORT TERM GOAL #3   Title Pt will demonstrate understanding of AE for cutting and tying shoes   Status Achieved   OT SHORT TERM GOAL #4   Title Pt will demonstrate beginning isolated grasp in R hand in prepartion for RUE use in functional tasks.   Status Achieved   OT SHORT TERM GOAL #5   Title Pt will demonstrate ability to use RUE as stabilizer at least 50% of the time during basic ADL tasks.   Status Achieved  pt able to use as gross assist for some parts of ADL activity  OT Long Term Goals - 03/23/15 1247    OT LONG TERM GOAL #1   Title Pt and family will be mod I with updated HEP - 04/01/2015   Status On-going   OT LONG TERM GOAL #2   Title Pt will demonstrate ability for alternating attention for familiar functional tasks (i.e. cooking task)   Status On-going   OT LONG TERM GOAL #3   Title Pt will demonstrate ability for low unilateral reach with min a  for hand grasp only during a functional task    Status Achieved   OT LONG TERM GOAL #4   Title Pt will demonstrate ability to use RUE as gross assist for at least 50% of basic self care tasks.   Status Achieved   OT LONG TERM GOAL #5   Title Pt will be able to grasp light cylindrical object with no more than min a.   Status Achieved   OT LONG TERM GOAL #6   Title Pt will be able to cast a fisihing pole with RUE with no more than mod a.   Status  On-going   OT LONG TERM GOAL #7   Title Pt will be mod I with hot meal prep   Status On-going               Plan - 03/23/15 1247    Clinical Impression Statement Pt continues to make gains with return in RUE. Fabricated wrist cock up splint to aid in funcitonal use of RUE.   Pt will benefit from skilled therapeutic intervention in order to improve on the following deficits (Retired) Abnormal gait;Decreased balance;Decreased coordination;Decreased cognition;Decreased knowledge of use of DME;Decreased mobility;Decreased range of motion;Decreased strength;Increased edema;Impaired UE functional use;Impaired tone;Impaired sensation;Pain   Rehab Potential Good   Clinical Impairments Affecting Rehab Potential global aphasia, apraxia, impaired sensation   OT Frequency 2x / week   OT Duration 8 weeks   OT Treatment/Interventions Self-care/ADL training;Ultrasound;Moist Heat;Electrical Stimulation;DME and/or AE instruction;Neuromuscular education;Therapeutic exercise;Functional Mobility Training;Manual Therapy;Passive range of motion;Splinting;Therapeutic activities;Balance training;Patient/family education;Cognitive remediation/compensation   Plan check wrist cock up, also make resting hand splint for night time as tone is increasing   Consulted and Agree with Plan of Care Patient;Family member/caregiver   Family Member Consulted father        Problem List Patient Active Problem List   Diagnosis Date Noted  . 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 Right hemiparesis (HCC) 10/21/2014  . Aphasia due to stroke 10/21/2014    Norton Pastel[place]i, Styles Fambro Halliday, OTR/L 03/23/2015, 12:50 PM  [facility] [address] [place], [place], [address] Phone: [phone]   Fax:  [phone]  Name: Mario [name] MRN: [id number] Date of Birth: [birth year]

## 2015-03-23 NOTE — Therapy (Signed)
[facility] [address] [place], [place], [address] Phone: [phone]   Fax:  [phone]  Speech Language Pathology Treatment  Patient Details  Name: Mario [name] MRN: [id number] Date of Birth: 05/08/1976 No Data Recorded  Encounter Date: 03/23/2015      End of Session - 03/23/15 1018    Visit Number 13   Number of Visits 16   Date for SLP Re-Evaluation 05/05/15   SLP Start Time 0935   SLP Stop Time  1015   SLP Time Calculation (min) 40 min   Activity Tolerance Patient tolerated treatment well      Past Medical History  Diagnosis Date  . Hypertension     Past Surgical History  Procedure Laterality Date  . No past surgeries      There were no vitals filed for this visit.  Visit Diagnosis: Expressive aphasia  Verbal apraxia      Subjective Assessment - 03/23/15 0940    Subjective "I'm bine (fine)"   Currently in Pain? No/denies               ADULT SLP TREATMENT - 03/23/15 0941    General Information   Behavior/Cognition Alert;Cooperative;Pleasant mood   Treatment Provided   Treatment provided Cognitive-Linquistic   Pain Assessment   Pain Assessment No/denies pain   Cognitive-Linquistic Treatment   Treatment focused on Aphasia;Apraxia   Skilled Treatment Facilitated production of simple phrases with usual mod A, including modeling, placement instruction, prolonged phonemes and slow rate with 70% accuracy. 2 word phrases.    Assessment / Recommendations / Plan   Plan Continue with current plan of care   Progression Toward Goals   Progression toward goals Progressing toward goals          SLP Education - 03/23/15 1015    Education provided Yes   Education Details Slow rate, one sound at a time   Methods Explanation;Handout   Comprehension Verbalized understanding;Verbal cues required;Need further instruction          SLP Short Term Goals - 03/23/15 [place] #1   Title pt to perform rote speech tasks (simultaneous with SLP/family) with 25% success, functional responses allowed   Time 1   Period Weeks  or 8 visits - for all STGs   Status Achieved   SLP SHORT TERM GOAL #2   Title pt will imitate consonant vowel syllables/words with written, picture, and other nonverbal cues, 60% success, functional responses allowed   Time 1   Period Weeks   Status Achieved   SLP SHORT TERM GOAL #3   Title pt will functionally communicate with simple augmentative/alternative device (via pictures/texting/typing) for basic wants/needs 60% success, in viable opportunities   Time 1   Period Weeks   Status Achieved   SLP SHORT TERM GOAL #4   Title pt will demo understanding of simple conversational questions via yes/no responses at 80% accuracy   Time 1   Period Weeks   Status Achieved   SLP SHORT TERM GOAL #5   Title pt will write name and address with rare nonverbal cues and no signs aphasia   Time 1   Period Weeks   Status Not Met          SLP Long Term Goals - 03/23/15 1018    SLP LONG TERM GOAL #1   Title pt will demo undertanding of simple/mod complex conversational questions via yes/no responses 80% accuracy  Time 2   Period Weeks  or 16 visits - for all LTGs   Status On-going   SLP LONG TERM GOAL #2   Title pt will demo undertanding of simple conversational quesitons via yes/no responses 90% accuracy   Time 2   Period Weeks   Status On-going   SLP LONG TERM GOAL #3   Title pt will perform rote speech tasks simultaneously with SLP with 40% accuracy (functional responses allowed)   Time 2   Period Weeks   Status On-going   SLP LONG TERM GOAL #4   Title pt will imitate salient words/family names with 20% success   Time 3   Period Weeks   Status Achieved   SLP LONG TERM GOAL #5   Title pt will demo use of simple alternative/augmentative communcation device or multimodal communication with 70% success in message conveyance    Time 3   Period Weeks   Status On-going          Plan - 03/23/15 1016    Clinical Impression Statement Pt making slow improvement with ST slightly reducing amount of cueing for simple phrases and words relevant to pt - Continue skilled ST to maximize verbal expression and compensations for verbal apraxia   Speech Therapy Frequency 2x / week   Treatment/Interventions SLP instruction and feedback;Compensatory strategies;Internal/external aids;Environmental controls;Patient/family education;Functional tasks;Cueing hierarchy;Multimodal communcation approach   Potential to Achieve Goals Good   Potential Considerations Severity of impairments        Problem List Patient Active Problem List   Diagnosis Date Noted  . 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 Right hemiparesis ([place]) 10/21/2014  . Aphasia due to stroke 10/21/2014    Kaula Klenke, Annye Rusk  MS, CCC-SLP  03/23/2015, 10:19 AM  [facility] [address] [place], [place], [address] Phone: [phone]   Fax:  [phone]   Name: Mario [name] MRN: [id number] Date of Birth: 03-Jul-1975

## 2015-03-23 NOTE — Therapy (Signed)
[facility] [address] [place], [place], [address] Phone: [phone]   Fax:  [phone]  Physical Therapy Treatment  Patient Details  Name: Mario [name] MRN: [id number] Date of Birth: 1976/05/01 No Data Recorded  Encounter Date: 03/23/2015      PT End of Session - 03/23/15 0808    Visit Number 11   Number of Visits 17   Date for PT Re-Evaluation 04/16/15  Date updated due to missing 2wks of PT   PT Start Time 0805   PT Stop Time 0845   PT Time Calculation (min) 40 min   Activity Tolerance Patient tolerated treatment well   Behavior During Therapy [facility] for tasks assessed/performed      Past Medical History  Diagnosis Date  . Hypertension     Past Surgical History  Procedure Laterality Date  . No past surgeries      There were no vitals filed for this visit.  Visit Diagnosis:  Unsteadiness  Weakness due to cerebrovascular accident  Abnormality of gait  Muscle weakness  Generalized weakness  Hemiparesis following cerebrovascular accident [facility])      Subjective Assessment - 03/23/15 0808    Subjective Pt stating "no" to pain or any changes since last visit.   Currently in Pain? No/denies   Pain Score 0-No pain      Treatment: NMES to right anterior tib for PF strengthening, EMPI unit, large muscle, intensity to tolerance x 12 minutes. Performed active-assist to active PF with on times and rest on off times, against gravity. Pt progressed from needed assistance to not needing assistance to achieve full PF during on times. Skin intact afterwards.   Standing outside of parallel bars with green theraband around right knee, no brace on. Band pulling against knee extension to assist with decreased recurvatum while activities performed. Occasional manual assist for stability needed:  Emphasis on right knee control with stance during movement of left leg. No UE support unless noted below with  activities. - left forward and then backward stepping, large movements/steps - left step taps/up's to 2 inch step - lateral lunges toward left side and back in - attempted right single leg stance, pt unable to hold > 1-2 seconds, even with UE support on bars - with left foot on 6 inch box: full squats, 2 sets of 10 reps, emphasis on right knee control with return to standing.        PT Short Term Goals - 03/04/15 1035    PT SHORT TERM GOAL #1   Title Pt will improve BERG balance score to 56/56 to decrease fall risk and improve functional mobility. (Target 03/03/15)   Baseline 55/56 on 03/04/15   Status Not Met   PT SHORT TERM GOAL #2   Title Pt will improve gait speed to 2.83 ft/sec to improve functional ambulation level to community ambulator.   (Target 03/03/15)   Baseline 2.00 ft/sec on 03/04/15, feel that this is due to increased tone.    Status Not Met   PT SHORT TERM GOAL #3   Title Pt will ambulate >500' on indoor and outdoor surfaces including grass, inclines and curb step without AD with AFO at S level without overt LOB to indicate safe negotiation of indoor and outdoor surfaces.  (Target 03/03/15)   Baseline met on 03/03/15, however recommend use of cane at home for increased safety and to decrease tone with gait.     Status Achieved   PT SHORT TERM GOAL #  4   Title Pt will negotiate up/down 4 steps in step to pattern with single rail at mod I level with safe technique to traverse community obstacles.   (Target 03/03/15)   Baseline Met on 9/22.   Status Achieved   PT SHORT TERM GOAL #5   Title Pt will improve 3MWT to 498' to improve ambulation function and functional endurance.    Baseline 366' on 03/03/15   Status Not Met           PT Long Term Goals - 03/18/15 1206    PT LONG TERM GOAL #1   Title Pts SIS mobility score will improve to 80% to indicate improved perceived functional mobility.  (Target 04/16/15)  Target dates updated due to missing 2 wks of PT   Baseline  66.7% on 8/31   Time 8   Period Weeks   PT LONG TERM GOAL #2   Title Pt will improve ankle DF strength to 3/5 in order to increase safety with household ambulation and begin to address ambulation without use of AFO.  (Target 04/16/15)   Baseline 1/5 on 8/31   Time 8   Period Weeks   PT LONG TERM GOAL #3   Title Pt will verbalize ability to return to leisure activities to indicate return to community function.   (Target 04/16/15)   Time 8   Period Weeks   PT LONG TERM GOAL #4   Title Pt will negotiate up/down 4 steps without rail in alternating fashion to traverse community stairs while carrying object.  (Target 04/16/15)   Time 8   Period Weeks   PT LONG TERM GOAL #5   Title Pt will demonstrate ability to ambulate up to 150' with decreased R genu recurvatum to improve R knee stability/strength and prevent knee injury.  (Target Date: 04/16/15)   Baseline 8   Period Weeks   PT LONG TERM GOAL #6   Title Pt will demonstrate ability to perform SLS x 5 secs on RLE to improve balance and awareness of RLE. (Target Date: 04/16/15)   Time 8   Period Weeks           Plan - 03/23/15 0808    Clinical Impression Statement Focused on increased PF strength of right leg and on increased knee control/strength in standing on right leg as well. No pain reported with session. No carryover of PF or knee control noted in gait without brace after exercises/NMR peroformed today.   Pt will benefit from skilled therapeutic intervention in order to improve on the following deficits Abnormal gait;Decreased balance;Decreased coordination;Decreased knowledge of precautions;Decreased safety awareness;Decreased strength;Difficulty walking;Impaired perceived functional ability;Impaired sensation;Impaired flexibility;Impaired tone;Impaired UE functional use;Postural dysfunction;Decreased knowledge of use of DME   Rehab Potential Excellent   Clinical Impairments Affecting Rehab Potential adhering to safety precautions    PT Frequency 2x / week   PT Duration 8 weeks   PT Treatment/Interventions ADLs/Self Care Home Management;Electrical Stimulation;Biofeedback;DME Instruction;Gait training;Stair training;Functional mobility training;Therapeutic exercise;Balance training;Neuromuscular re-education;Patient/family education;Manual techniques;Visual/perceptual remediation/compensation   PT Next Visit Plan R knee stability (without AFO), stretching heel coreds, PNF to decrease tone, isolated hip/knee flex with arms in shoulder flexed/elbows extended position to decrease retraction on R side, RLE WB.   Consulted and Agree with Plan of Care Patient        Problem List Patient Active Problem List   Diagnosis Date Noted  . 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 Right hemiparesis (  [place]) 10/21/2014  . Aphasia due to stroke 10/21/2014    Mario [name] 03/23/2015, 2:28 PM  Mario [name], PTA, [facility] [address], [place], [place] [address] [phone] 03/23/2015, 2:28 PM  Name: AKSHATH MCCAREY MRN: [id number] Date of Birth: [birth year]

## 2015-03-23 NOTE — Patient Instructions (Signed)
Practice simple, common phrases

## 2015-03-23 NOTE — Patient Instructions (Signed)
Your Splint This splint should initially be fitted by a healthcare practitioner.  The healthcare practitioner is responsible for providing wearing instructions and precautions to the patient, other healthcare practitioners and care provider involved in the patient's care.  This splint was custom made for you. Please read the following instructions to learn about wearing and caring for your splint.  Precautions Should your splint cause any of the following problems, remove the splint immediately and contact your therapist/physician.  Swelling  Severe Pain  Pressure Areas  Stiffness  Numbness  Do not wear your splint while operating machinery unless it has been fabricated for that purpose.  When To Wear Your Splint Where your splint according to your therapist/physician instructions. Wear your splint 2 hours in the morning and 2 hours in the afternoon.  DO NOT SLEEP IN YOUR SPLINT.         Care and Cleaning of Your Splint 1. Keep your splint away from open flames. 2. Your splint will lose its shape in temperatures over 135 degrees Farenheit, ( in car windows, near radiators, ovens or in hot water).  Never make any adjustments to your splint, if the splint needs adjusting remove it and make an appointment to see your therapist. 3. Your splint, including the cushion liner may be cleaned with soap and lukewarm water.  Do not immerse in hot water over 135 degrees Farenheit. 4. Straps may be washed with soap and water, but do not moisten the self-adhesive portion. 5. For ink or hard to remove spots use a scouring cleanser which contains chlorine.  Rinse the splint thoroughly after using chlorine cleanser. 6.

## 2015-03-25 ENCOUNTER — Ambulatory Visit: Payer: BC Managed Care – PPO | Admitting: Speech Pathology

## 2015-03-25 ENCOUNTER — Encounter: Payer: Self-pay | Admitting: Occupational Therapy

## 2015-03-25 ENCOUNTER — Ambulatory Visit: Payer: BC Managed Care – PPO | Admitting: Occupational Therapy

## 2015-03-25 DIAGNOSIS — G8111 Spastic hemiplegia affecting right dominant side: Secondary | ICD-10-CM

## 2015-03-25 DIAGNOSIS — IMO0002 Reserved for concepts with insufficient information to code with codable children: Secondary | ICD-10-CM

## 2015-03-25 DIAGNOSIS — R4701 Aphasia: Secondary | ICD-10-CM

## 2015-03-25 DIAGNOSIS — R201 Hypoesthesia of skin: Secondary | ICD-10-CM

## 2015-03-25 DIAGNOSIS — I69359 Hemiplegia and hemiparesis following cerebral infarction affecting unspecified side: Secondary | ICD-10-CM

## 2015-03-25 DIAGNOSIS — R482 Apraxia: Secondary | ICD-10-CM

## 2015-03-25 NOTE — Patient Instructions (Signed)
  FFF orty  FFFFiff teen  [facility]  [facility]  co  [place]  [place]  juice
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
## 2015-03-25 NOTE — Therapy (Signed)
[facility] [address] [place], [place], [address] Phone: [phone]   Fax:  [phone]  Speech Language Pathology Treatment  Patient Details  Name: Mario [name] MRN: [id number] Date of Birth: Dec 14, 1975 No Data Recorded  Encounter Date: 03/25/2015      End of Session - 03/25/15 1457    Visit Number 14   Number of Visits 16   Date for SLP Re-Evaluation 05/05/15   SLP Start Time 1400   SLP Stop Time  1446   SLP Time Calculation (min) 46 min   Activity Tolerance Patient tolerated treatment well      Past Medical History  Diagnosis Date  . Hypertension     Past Surgical History  Procedure Laterality Date  . No past surgeries      There were no vitals filed for this visit.  Visit Diagnosis: Expressive aphasia  Verbal apraxia      Subjective Assessment - 03/25/15 1401    Subjective "alright"               ADULT SLP TREATMENT - 03/25/15 1404    General Information   Behavior/Cognition Alert;Cooperative;Pleasant mood   Treatment Provided   Treatment provided Cognitive-Linquistic   Pain Assessment   Pain Assessment No/denies pain   Cognitive-Linquistic Treatment   Treatment focused on Aphasia;Apraxia   Skilled Treatment Pt repeated 2-3 word phrases with usual mod A. When answering questions with 1-2 word utterance, pt required mod A for phoneme placement. Ongoing instuction for slow rate, use of syllables and initiate phoneme placement to approximate word. Pt utilized writing at word level to indicate/augement verbal answer with 70% accuracy, spelling erors. As verbal apraxia slowly improves, aphasia is becoming more evident. Pt. required mod A to answer simple time questions. and read clock.   Assessment / Recommendations / Plan   Plan Continue with current plan of care   Progression Toward Goals   Progression toward goals Progressing toward goals            SLP Short Term Goals -  03/25/15 1456    SLP SHORT TERM GOAL #1   Title pt to perform rote speech tasks (simultaneous with SLP/family) with 25% success, functional responses allowed   Time 1   Period Weeks  or 8 visits - for all STGs   Status Achieved   SLP SHORT TERM GOAL #2   Title pt will imitate consonant vowel syllables/words with written, picture, and other nonverbal cues, 60% success, functional responses allowed   Time 1   Period Weeks   Status Achieved   SLP SHORT TERM GOAL #3   Title pt will functionally communicate with simple augmentative/alternative device (via pictures/texting/typing) for basic wants/needs 60% success, in viable opportunities   Time 1   Period Weeks   Status Achieved   SLP SHORT TERM GOAL #4   Title pt will demo understanding of simple conversational questions via yes/no responses at 80% accuracy   Time 1   Period Weeks   Status Achieved   SLP SHORT TERM GOAL #5   Title pt will write name and address with rare nonverbal cues and no signs aphasia   Time 1   Period Weeks   Status Not Met          SLP Long Term Goals - 03/25/15 1456    SLP LONG TERM GOAL #1   Title pt will demo undertanding of simple/mod complex conversational questions via yes/no responses 80% accuracy  Time 2   Period Weeks  or 16 visits - for all LTGs   Status Achieved   SLP LONG TERM GOAL #2   Title pt will demo undertanding of simple conversational quesitons via yes/no responses 90% accuracy   Time 2   Period Weeks   Status Achieved   SLP LONG TERM GOAL #3   Title pt will perform rote speech tasks simultaneously with SLP with 40% accuracy (functional responses allowed)   Time 2   Period Weeks   Status On-going   SLP LONG TERM GOAL #4   Title pt will imitate salient words/family names with 20% success   Time 3   Period Weeks   Status Achieved   SLP LONG TERM GOAL #5   Title pt will demo use of simple alternative/augmentative communcation device or multimodal communication with 70%  success in message conveyance   Time 3   Period Weeks   Status On-going          Plan - 03/25/15 1455    Clinical Impression Statement Pt making slow improvement with ST slightly reducing amount of cueing for simple phrases and words relevant to pt - Continue skilled ST to maximize verbal expression and compensations for verbal apraxia   Speech Therapy Frequency 2x / week   Potential to Achieve Goals Good   Potential Considerations Severity of impairments        Problem List Patient Active Problem List   Diagnosis Date Noted  . 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 Left middle cerebral artery stroke ([place]) 10/21/2014  . Right hemiparesis ([place]) 10/21/2014  . Aphasia due to stroke 10/21/2014    Shazia Mitchener, Annye Rusk MS, CCC-SLP 03/25/2015, 2:58 PM  [facility] [address] [place], [place], [address] Phone: [phone]   Fax:  [phone]   Name: Mario [name] MRN: [id number] Date of Birth: 1975/09/07

## 2015-03-30 ENCOUNTER — Encounter: Payer: Self-pay | Admitting: Occupational Therapy

## 2015-03-30 ENCOUNTER — Encounter: Payer: Self-pay | Admitting: Rehabilitation

## 2015-03-30 ENCOUNTER — Ambulatory Visit: Payer: BC Managed Care – PPO | Admitting: Rehabilitation

## 2015-03-30 ENCOUNTER — Ambulatory Visit: Payer: BC Managed Care – PPO | Admitting: Occupational Therapy

## 2015-03-30 ENCOUNTER — Ambulatory Visit: Payer: BC Managed Care – PPO | Admitting: Speech Pathology

## 2015-03-30 DIAGNOSIS — M6281 Muscle weakness (generalized): Secondary | ICD-10-CM

## 2015-03-30 DIAGNOSIS — R482 Apraxia: Secondary | ICD-10-CM

## 2015-03-30 DIAGNOSIS — R2681 Unsteadiness on feet: Secondary | ICD-10-CM

## 2015-03-30 DIAGNOSIS — I69359 Hemiplegia and hemiparesis following cerebral infarction affecting unspecified side: Secondary | ICD-10-CM

## 2015-03-30 DIAGNOSIS — IMO0002 Reserved for concepts with insufficient information to code with codable children: Secondary | ICD-10-CM

## 2015-03-30 DIAGNOSIS — R201 Hypoesthesia of skin: Secondary | ICD-10-CM

## 2015-03-30 DIAGNOSIS — R4701 Aphasia: Secondary | ICD-10-CM

## 2015-03-30 DIAGNOSIS — R4189 Other symptoms and signs involving cognitive functions and awareness: Secondary | ICD-10-CM

## 2015-03-30 DIAGNOSIS — G8111 Spastic hemiplegia affecting right dominant side: Secondary | ICD-10-CM

## 2015-03-30 DIAGNOSIS — R269 Unspecified abnormalities of gait and mobility: Secondary | ICD-10-CM

## 2015-03-30 NOTE — Patient Instructions (Signed)
Homework packet provided re: simple reading comprehension, written expression at word level

## 2015-03-30 NOTE — Therapy (Signed)
[facility] [address] [place], [place], [address] Phone: [phone]   Fax:  [phone]  Physical Therapy Treatment  Patient Details  Name: Mario [name] MRN: [id number] Date of Birth: 1976-04-17 No Data Recorded  Encounter Date: 03/30/2015      PT End of Session - 03/30/15 1316    Visit Number 12   Number of Visits 17   Date for PT Re-Evaluation 04/16/15   PT Start Time 0930   PT Stop Time 1015   PT Time Calculation (min) 45 min   Equipment Utilized During Treatment Gait belt   Activity Tolerance Patient tolerated treatment well   Behavior During Therapy [facility] for tasks assessed/performed      Past Medical History  Diagnosis Date  . Hypertension     Past Surgical History  Procedure Laterality Date  . No past surgeries      There were no vitals filed for this visit.  Visit Diagnosis:  Abnormality of gait  Unsteadiness  Hemiparesis following cerebrovascular accident [facility])      Subjective Assessment - 03/30/15 1315    Subjective Pt stating "no" to pain or any changes since last visit.   Limitations Walking;House hold activities;Reading;Standing;Writing   Patient Stated Goals Improve speech, be able to walk better, and get functional use of RUE.    Currently in Pain? No/denies             Gait:  Skilled session focused on trials of various shoe and AFO/bracing changes to better control R ankle inversion during stance and swing phase of gait.  Unable to differentiate whether pt has increased tone or just has decreased activation of peroneals causing inversion to override ankle DF during gait.  Trailed use of R walk on AFO (PLS) with heel wedge x 115'.  Note that he continued to have some inversion and requires cues for R knee control in stance as well as tactile cues for posture and trunk control.  Then added lateral wedge without use of brace x 115' to further increase amount of eversion  during stance.  Still noted increased supination during swing and with increased fatigue did have single instance of R ankle roll needing assist to correct.  Then trialed use of R walk on AFO (PLS) with use of supination strap x 2 reps of 115' with marked improvement noted with cues for increased toe out with initial contact to prevent pt compensating with toe drag (decreased hip/knee flex) and also continued to provide tactile cues at R knee for decreased hyperextension.  Pt was able to correct and carry over with intermittent cuing.  Also provided light assist at trunk to decrease activation at L shoulder with depression and assist under R scapular for increased elevation.   Will contact [employer] to come for consult during PT visit in order to adjust bracing needs.                       PT Education - 03/30/15 1315    Education provided Yes   Education Details continued stretches to decrease inversion tightness   Person(s) Educated Patient   Methods Explanation   Comprehension Verbalized understanding          PT Short Term Goals - 03/04/15 1035    PT SHORT TERM GOAL #1   Title Pt will improve BERG balance score to 56/56 to decrease fall risk and improve functional mobility. (Target 03/03/15)   Baseline 55/56 on 03/04/15  Status Not Met   PT SHORT TERM GOAL #2   Title Pt will improve gait speed to 2.83 ft/sec to improve functional ambulation level to community ambulator.   (Target 03/03/15)   Baseline 2.00 ft/sec on 03/04/15, feel that this is due to increased tone.    Status Not Met   PT SHORT TERM GOAL #3   Title Pt will ambulate >500' on indoor and outdoor surfaces including grass, inclines and curb step without AD with AFO at S level without overt LOB to indicate safe negotiation of indoor and outdoor surfaces.  (Target 03/03/15)   Baseline met on 03/03/15, however recommend use of cane at home for increased safety and to decrease tone with gait.     Status Achieved   PT  SHORT TERM GOAL #4   Title Pt will negotiate up/down 4 steps in step to pattern with single rail at mod I level with safe technique to traverse community obstacles.   (Target 03/03/15)   Baseline Met on 9/22.   Status Achieved   PT SHORT TERM GOAL #5   Title Pt will improve 3MWT to 498' to improve ambulation function and functional endurance.    Baseline 366' on 03/03/15   Status Not Met           PT Long Term Goals - 03/18/15 1206    PT LONG TERM GOAL #1   Title Pts SIS mobility score will improve to 80% to indicate improved perceived functional mobility.  (Target 04/16/15)  Target dates updated due to missing 2 wks of PT   Baseline 66.7% on 8/31   Time 8   Period Weeks   PT LONG TERM GOAL #2   Title Pt will improve ankle DF strength to 3/5 in order to increase safety with household ambulation and begin to address ambulation without use of AFO.  (Target 04/16/15)   Baseline 1/5 on 8/31   Time 8   Period Weeks   PT LONG TERM GOAL #3   Title Pt will verbalize ability to return to leisure activities to indicate return to community function.   (Target 04/16/15)   Time 8   Period Weeks   PT LONG TERM GOAL #4   Title Pt will negotiate up/down 4 steps without rail in alternating fashion to traverse community stairs while carrying object.  (Target 04/16/15)   Time 8   Period Weeks   PT LONG TERM GOAL #5   Title Pt will demonstrate ability to ambulate up to 150' with decreased R genu recurvatum to improve R knee stability/strength and prevent knee injury.  (Target Date: 04/16/15)   Baseline 8   Period Weeks   PT LONG TERM GOAL #6   Title Pt will demonstrate ability to perform SLS x 5 secs on RLE to improve balance and awareness of RLE. (Target Date: 04/16/15)   Time 8   Period Weeks               Plan - 03/30/15 1317    Clinical Impression Statement Skilled session focused on gait training with varying uses of AFO and additions to prevent increased supination during gait.   Note that upon pt walking in with blue rocker AFO, pts foot remains in supinated position throughout swing and slightly in stance as well.  Trialed many options in session and feel that pt seems safer with use of R walk on AFO with use of supination strap and heel wedge for DF assist, decreased supination and also to  prevent hyperextension in R knee during stance.  Cues for posture and R knee control during stance which did improve with postural corrections and light tactile cues.  Will notifiy [employer] to attempt to get new brace.     Pt will benefit from skilled therapeutic intervention in order to improve on the following deficits Abnormal gait;Decreased balance;Decreased coordination;Decreased knowledge of precautions;Decreased safety awareness;Decreased strength;Difficulty walking;Impaired perceived functional ability;Impaired sensation;Impaired flexibility;Impaired tone;Impaired UE functional use;Postural dysfunction;Decreased knowledge of use of DME   Rehab Potential Excellent   Clinical Impairments Affecting Rehab Potential adhering to safety precautions   PT Frequency 2x / week   PT Duration 8 weeks   PT Treatment/Interventions ADLs/Self Care Home Management;Electrical Stimulation;Biofeedback;DME Instruction;Gait training;Stair training;Functional mobility training;Therapeutic exercise;Balance training;Neuromuscular re-education;Patient/family education;Manual techniques;Visual/perceptual remediation/compensation   PT Next Visit Plan R knee stability (without AFO), stretching heel cord (ensuring ankle in neutral, eversion stretch, isolated hip/knee flex with arms in shoulder flexed/elbows extended position to decrease retraction on R side, RLE WB.   Consulted and Agree with Plan of Care Patient        Problem List Patient Active Problem List   Diagnosis Date Noted  . 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 Left middle cerebral artery stroke ([place]) 10/21/2014  . Right  hemiparesis ([place]) 10/21/2014  . Aphasia due to stroke 10/21/2014    Cameron Sprang, PT, MPT [facility] [address] [place], [place], [address] Phone: [phone]   Fax:  [phone] 03/30/2015, 1:29 PM  Name: Mario [name] MRN: [id number] Date of Birth: [birth year]

## 2015-03-30 NOTE — Therapy (Signed)
[facility] [address] [place], [place], [address] Phone: [phone]   Fax:  [phone]  Speech Language Pathology Treatment  Patient Details  Name: Mario [name] MRN: [id number] Date of Birth: 1975-06-16 No Data Recorded  Encounter Date: 03/30/2015      End of Session - 03/30/15 1206    Number of Visits 16   Date for SLP Re-Evaluation 05/05/15   SLP Start Time 1017   SLP Stop Time  1100   SLP Time Calculation (min) 43 min   Activity Tolerance Patient tolerated treatment well      Past Medical History  Diagnosis Date  . Hypertension     Past Surgical History  Procedure Laterality Date  . No past surgeries      There were no vitals filed for this visit.  Visit Diagnosis: Expressive aphasia  Verbal apraxia             ADULT SLP TREATMENT - 03/30/15 1155    General Information   Behavior/Cognition Alert;Cooperative;Pleasant mood   Treatment Provided   Treatment provided Cognitive-Linquistic   Pain Assessment   Pain Assessment No/denies pain   Cognitive-Linquistic Treatment   Treatment focused on Aphasia;Apraxia   Skilled Treatment Written exprerssion facilitated at word level with pt verbally naming object in room, then having him write the word - pt required usual to frequent mod A  at written word level. He benefitted from 1st letter cues, fill in blank cues and cues identifying errors. - written expression at word level 60% accuracte, affecting his use of written expression to augment verbal expression. Pt answered questions aproximating 1-2 word answers with 75% accuracy/intelligiblity and usual min questioning cues.    Assessment / Recommendations / Plan   Plan Continue with current plan of care   Progression Toward Goals   Progression toward goals Progressing toward goals            SLP Short Term Goals - 03/25/15 1456    SLP SHORT TERM GOAL #1   Title pt to perform rote  speech tasks (simultaneous with SLP/family) with 25% success, functional responses allowed   Time 1   Period Weeks  or 8 visits - for all STGs   Status Achieved   SLP SHORT TERM GOAL #2   Title pt will imitate consonant vowel syllables/words with written, picture, and other nonverbal cues, 60% success, functional responses allowed   Time 1   Period Weeks   Status Achieved   SLP SHORT TERM GOAL #3   Title pt will functionally communicate with simple augmentative/alternative device (via pictures/texting/typing) for basic wants/needs 60% success, in viable opportunities   Time 1   Period Weeks   Status Achieved   SLP SHORT TERM GOAL #4   Title pt will demo understanding of simple conversational questions via yes/no responses at 80% accuracy   Time 1   Period Weeks   Status Achieved   SLP SHORT TERM GOAL #5   Title pt will write name and address with rare nonverbal cues and no signs aphasia   Time 1   Period Weeks   Status Not Met          SLP Long Term Goals - 03/30/15 1201    SLP LONG TERM GOAL #1   Title pt will demo undertanding of simple/mod complex conversational questions via yes/no responses 80% accuracy   Time 2   Period Weeks  or 16 visits - for all LTGs   Status  Achieved   SLP LONG TERM GOAL #2   Title pt will demo undertanding of simple conversational quesitons via yes/no responses 90% accuracy   Time 2   Period Weeks   Status Achieved   SLP LONG TERM GOAL #3   Title pt will perform rote speech tasks simultaneously with SLP with 40% accuracy (functional responses allowed)   Time 2   Period Weeks   Status On-going   SLP LONG TERM GOAL #4   Title pt will imitate salient words/family names with 20% success   Time 2   Period Weeks   Status Achieved   SLP LONG TERM GOAL #5   Title pt will demo use of simple alternative/augmentative communcation device or multimodal communication with 70% success in message conveyance   Time 2   Period Weeks   Status On-going           Plan - 03/30/15 1159    Clinical Impression Statement Focused on written expression at word level today with usual mod A for simple words. Continue skilled ST to maximize communcation accuracy for wants. needs and social interactions.    Speech Therapy Frequency 2x / week   Treatment/Interventions SLP instruction and feedback;Compensatory strategies;Internal/external aids;Environmental controls;Patient/family education;Functional tasks;Cueing hierarchy;Multimodal communcation approach   Potential to Achieve Goals Fair   Potential Considerations Severity of impairments   Consulted and Agree with Plan of Care Patient        Problem List Patient Active Problem List   Diagnosis Date Noted  . 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 Left middle cerebral artery stroke ([place]) 10/21/2014  . Right hemiparesis ([place]) 10/21/2014  . Aphasia due to stroke 10/21/2014    Aynsley Fleet, Annye Rusk MS, CCC-SLP 03/30/2015, 12:07 PM  [facility] [address] [place], [place], [address] Phone: [phone]   Fax:  [phone]   Name: Mario [name] MRN: [id number] Date of Birth: December 16, 1975

## 2015-03-30 NOTE — Therapy (Signed)
[facility] [address] [place], [place], [address] Phone: [phone]   Fax:  [phone]  Occupational Therapy Treatment  Patient Details  Name: Mario [name] MRN: [id number] Date of Birth: 1975/09/24 Referring Provider: Rochel Brome  Encounter Date: 03/30/2015      OT End of Session - 03/30/15 1259    Visit Number 16   Number of Visits 16   Date for OT Re-Evaluation 04/01/15   Authorization Type [employer] state health PPO no visit limit   OT Start Time 1100   OT Stop Time 1146   OT Time Calculation (min) 46 min   Activity Tolerance Patient tolerated treatment well      Past Medical History  Diagnosis Date  . Hypertension     Past Surgical History  Procedure Laterality Date  . No past surgeries      There were no vitals filed for this visit.  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 No/denies                      OT Treatments/Exercises (OP) - 03/30/15 0001    Splinting   Splinting Due to increasing tone, fabircated and fitted resting hand splint for pt to wear at night only. Pt will build up over next three days and has written instructions in how to do this. Pt will then wear splint only at night and use wrist cock up during the day to facilitate functional use of the R hand. Pt nodded in understanding and will share written instructions with dad.                   OT Short Term Goals - 03/30/15 1248    OT SHORT TERM GOAL #1   Title Pt will  and family member will be mod I  with HEP - 03/04/2015   Status Achieved   OT SHORT TERM GOAL #2   Title Pt will demonstrate ability to complete shoulder flexion to 50* without eblow flexion in blilateral reach to work toward functional low reach with RUE.   Status Achieved   OT SHORT TERM GOAL #3   Title Pt will demonstrate understanding of AE for cutting and tying shoes   Status Achieved   OT SHORT TERM GOAL #4   Title Pt will demonstrate beginning isolated grasp in R hand in prepartion for RUE use in functional tasks.   Status Achieved   OT SHORT TERM GOAL #5   Title Pt will demonstrate ability to use RUE as stabilizer at least 50% of the time during basic ADL tasks.   Status Achieved  pt able to use as gross assist for some parts of ADL activity   Additional Short Term Goals   Additional Short Term Goals Yes   OT SHORT TERM GOAL #6   Title Pt will be mod I with upgraded HEP - 04/29/2015   Status New   OT SHORT TERM GOAL #7   Title Pt will demonstrate ability to cast fishing pole with RUE  with no more than mod assist   Status New   OT SHORT TERM GOAL #8   Title Pt will be able to use RUE as stabilizer during hot meal prep   Status New   OT SHORT TERM GOAL  #9   TITLE Pt will demonstrate ability to pick up and place light object at 90* of reach with RUE with min compensations.   Status New           OT Long Term Goals - 03/30/15 1249    OT LONG TERM GOAL #1   Title Pt and family will be mod I with updated HEP - 04/01/2015   Status Achieved   OT LONG TERM GOAL #2   Title Pt will demonstrate ability for alternating attention for familiar functional tasks (i.e. cooking task)   Status Achieved   OT LONG TERM GOAL #3   Title Pt will demonstrate ability for low unilateral reach with min a  for hand grasp only during a functional task    Status Achieved   OT LONG TERM GOAL #4   Title Pt will demonstrate ability to use RUE as gross assist for at least 50% of basic self care tasks.   Status Achieved   OT LONG  TERM GOAL #5   Title Pt will be able to grasp light cylindrical object with no more than min a.   Status Achieved   Long Term Additional Goals   Additional Long Term Goals Yes   OT LONG TERM GOAL #6   Title Pt will be able to cast a fisihing pole with RUE with no more than mod a.  moved to STG on 03/30/2015   Status Deferred   OT LONG TERM GOAL #7   Title Pt will be mod I with hot meal prep   Status Achieved   OT LONG TERM GOAL #8   Title Pt will be mod I with upgraded HEP prn - 05/27/2015   Status New   OT LONG TERM GOAL  #9   Baseline Pt will demonstrate ability to overhead reach to grasp light object off shelf with RUE with no more than min facilitation   Status New   OT LONG TERM GOAL  #10   TITLE Pt will demostrate ability to use RUE as gross assist for 100% of basic self care tasks.   Status New   OT LONG TERM GOAL  #11   TITLE Pt will be able to use RUE as gross assist during hot meal prep   Status New               Plan - 03/30/15 1256    Clinical Impression Statement Pt has made excellent progress and has met 5/5 STG's and 6/7 LTG's.  Pt has demostrate significant improvement in isolated movement in RUE and functional use. Will renew pt today for additional 8 weeks given rapid progress. Pt in agreement.    Pt will benefit from skilled therapeutic intervention in order to improve on the following deficits (Retired) Abnormal gait;Decreased balance;Decreased coordination;Decreased cognition;Decreased knowledge of use of DME;Decreased mobility;Decreased range of motion;Decreased strength;Increased edema;Impaired UE functional use;Impaired tone;Impaired sensation;Pain   Rehab Potential Good   Clinical Impairments Affecting Rehab Potential global aphasia, apraxia, impaired sensation   OT Frequency 2x / week   OT Duration 8 weeks   OT Treatment/Interventions Self-care/ADL training;Ultrasound;Moist Heat;Electrical Stimulation;DME and/or AE instruction;Neuromuscular  education;Therapeutic exercise;Functional Mobility Training;Manual Therapy;Passive range of motion;Splinting;Therapeutic activities;Balance training;Patient/family education;Cognitive remediation/compensation   Plan check  resting hand splint and tolerance, share new goals, NMR for RUE for grasp, release and reach   Consulted and Agree with Plan of Care Patient        Problem List Patient Active Problem List   Diagnosis Date Noted  . 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 Right hemiparesis ([place]) 10/21/2014  . Aphasia due to stroke 10/21/2014    Quay Burow, OTR/L 03/30/2015, 1:01 PM  [facility] [address] [place], [place], [address] Phone: [phone]   Fax:  [phone]  Name: Mario [name] MRN: [id number] Date of Birth: 01-28-1976

## 2015-03-31 ENCOUNTER — Telehealth: Payer: Self-pay | Admitting: Rehabilitation

## 2015-03-31 NOTE — Telephone Encounter (Signed)
Dr. Sedalia Mutaox,  [name] has been utilizing a new R AFO brace during PT session to address R foot drop. When wearing R AFO, patient demonstrates less frequent losses of balance while ambulating. The patient would benefit from right AFO brace to decrease fall risk.  f you agree, please submit an order for a R AFO.  Thank you, Harriet ButteEmily Zea Kostka, PT, MPT [facility] [address] [place], [place], [address] Phone: [phone]   Fax:  [phone] 03/31/2015, 10:02 AM

## 2015-04-01 ENCOUNTER — Telehealth: Payer: Self-pay | Admitting: Rehabilitation

## 2015-04-01 ENCOUNTER — Encounter: Payer: Self-pay | Admitting: Occupational Therapy

## 2015-04-01 ENCOUNTER — Ambulatory Visit: Payer: BC Managed Care – PPO | Admitting: Rehabilitation

## 2015-04-01 ENCOUNTER — Ambulatory Visit: Payer: BC Managed Care – PPO | Admitting: Occupational Therapy

## 2015-04-01 ENCOUNTER — Ambulatory Visit: Payer: BC Managed Care – PPO

## 2015-04-01 ENCOUNTER — Encounter: Payer: Self-pay | Admitting: Rehabilitation

## 2015-04-01 DIAGNOSIS — R4701 Aphasia: Secondary | ICD-10-CM

## 2015-04-01 DIAGNOSIS — IMO0002 Reserved for concepts with insufficient information to code with codable children: Secondary | ICD-10-CM

## 2015-04-01 DIAGNOSIS — R201 Hypoesthesia of skin: Secondary | ICD-10-CM

## 2015-04-01 DIAGNOSIS — R2681 Unsteadiness on feet: Secondary | ICD-10-CM

## 2015-04-01 DIAGNOSIS — M6281 Muscle weakness (generalized): Secondary | ICD-10-CM

## 2015-04-01 DIAGNOSIS — R269 Unspecified abnormalities of gait and mobility: Secondary | ICD-10-CM

## 2015-04-01 DIAGNOSIS — G8111 Spastic hemiplegia affecting right dominant side: Secondary | ICD-10-CM

## 2015-04-01 DIAGNOSIS — R4189 Other symptoms and signs involving cognitive functions and awareness: Secondary | ICD-10-CM

## 2015-04-01 NOTE — Therapy (Signed)
[facility] [address] [place], [place], [address] Phone: [phone]   Fax:  [phone]  Speech Language Pathology Treatment  Patient Details  Name: Mario [name] MRN: [id number] Date of Birth: 10/01/1975 No Data Recorded  Encounter Date: 04/01/2015      End of Session - 04/01/15 1159    Visit Number 16   Number of Visits 62   SLP Start Time 1150   SLP Stop Time  1230   SLP Time Calculation (min) 40 min      Past Medical History  Diagnosis Date  . Hypertension     Past Surgical History  Procedure Laterality Date  . No past surgeries      There were no vitals filed for this visit.  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  Pt wrote answer targeted with <15% success; letter fill-ins with target words >20%. Copied target words 100%   Assessment / Recommendations / Plan   Plan Continue with current plan of care   Progression Toward Goals   Progression toward goals Progressing toward goals            SLP Short Term Goals - 04/01/15 1352    SLP SHORT TERM GOAL #1   Title pt to perform rote speech tasks (simultaneous with SLP/family) with 25% success, functional responses allowed   Time --   Period --  or 8 visits - for all STGs   Status Achieved   SLP SHORT TERM GOAL #2   Title  pt will imitate consonant vowel syllables/words with written, picture, and other nonverbal cues, 60% success, functional responses allowed   Time --   Period --   Status Achieved   SLP SHORT TERM GOAL #3   Title pt will functionally communicate with simple augmentative/alternative device (via pictures/texting/typing) for basic wants/needs 60% success, in viable opportunities   Time --   Period --   Status Achieved   SLP SHORT TERM GOAL #4   Title pt will demo understanding of simple conversational questions via yes/no responses at 80% accuracy   Time --   Period --   Status Achieved   SLP SHORT TERM GOAL #5   Title pt will write name and address with rare nonverbal cues and no signs aphasia   Time --   Period --   Status Not Met   Additional Short Term Goals   Additional Short Term Goals Yes   SLP SHORT TERM GOAL #6   Title pt will provide functional multimodal responses in simple conversation with occasional min A   Baseline for 4 weeks or 8 visits, for all STGs   Time 4   Period Weeks   Status New   SLP SHORT TERM GOAL #7   Title pt will demo understanding of mod complex conversation with repeats  allowed 90%   Time 4   Period Weeks   Status New   SLP SHORT TERM GOAL #8   Title pt will write one word responses to communicate functionally, with multiple attempts allowed, achieving 75% listener comprehension   Time 4   Period Weeks   Status New          SLP Long Term Goals - 04/01/15 1159    SLP LONG TERM GOAL #1   Title pt will demo undertanding of simple/mod complex conversational questions via yes/no responses 80% accuracy   Time --   Period --  or 16 visits - for all LTGs   Status Achieved   SLP LONG TERM GOAL #2   Title pt will demo undertanding of simple conversational quesitons via yes/no responses 90% accuracy   Time --   Period --   Status Achieved   SLP LONG TERM GOAL #3   Title pt will perform rote speech tasks simultaneously with SLP with 40% accuracy  (functional responses allowed)   Time --   Period --   Status Not Met   SLP LONG TERM GOAL #4   Title pt will imitate salient words/family names with 20% success   Time --   Period --   Status Achieved   SLP LONG TERM GOAL #5   Title pt will demo use of simple alternative/augmentative communcation and/or multimodal communication with 80% success in message conveyance   Baseline 8 more weeks or 16 more visits   Time 8   Period --   Status Revised   Additional Long Term Goals   Additional Long Term Goals Yes   SLP LONG TERM GOAL #6   Title pt will state family names/information and other salient personal 2-3 word information with 80% success   Time 8   Period Weeks   Status New          Plan - 04/01/15 1343    Clinical Impression Statement Focused on written expression as well as verbal expression at 1-2 word level today with usual mod-max A for simple words. Please see goal update for goals met/not met. Pt's verbal expression at this time is non-functional, and pt uses copensations occasionally, but they are somewhat nondescript. Pt may eventually benefit from a high tech augmentative communication device. Continue skilled ST to maximize communcation accuracy for wants, needs, and social interactions.     Speech Therapy Frequency 2x / week   Duration 1 week  Renewal begins 04-05-15 - sent 04-01-15   Treatment/Interventions SLP instruction and feedback;Compensatory strategies;Internal/external aids;Environmental controls;Patient/family education;Functional tasks;Cueing hierarchy;Multimodal communcation approach   Potential to Achieve Goals Fair   Potential Considerations Severity of impairments   Consulted and Agree with Plan of Care Patient        Problem List Patient Active Problem List   Diagnosis Date Noted  . 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 Right hemiparesis ([place]) 10/21/2014  .  Aphasia due to stroke 10/21/2014    [facility] , [place], CCC-SLP  04/01/2015, 3:22 PM  [facility] [address] [place], [place], [address] Phone: [phone]   Fax:  [phone]   Name: Mario [name] MRN: [id number] Date of Birth: [birth year]

## 2015-04-01 NOTE — Therapy (Signed)
[facility] [address] [place], [place], [address] Phone: [phone]   Fax:  [phone]  Physical Therapy Treatment  Patient Details  Name: Mario [name] MRN: [id number] Date of Birth: Jul 11, 1975 No Data Recorded  Encounter Date: 04/01/2015      PT End of Session - 04/01/15 1815    Visit Number 13   Number of Visits 17   Date for PT Re-Evaluation 04/16/15   PT Start Time 1316   PT Stop Time 1401   PT Time Calculation (min) 45 min   Activity Tolerance Patient tolerated treatment well   Behavior During Therapy [facility] for tasks assessed/performed      Past Medical History  Diagnosis Date  . Hypertension     Past Surgical History  Procedure Laterality Date  . No past surgeries      There were no vitals filed for this visit.  Visit Diagnosis:  Abnormality of gait  Unsteadiness  Weakness due to cerebrovascular accident  Spastic hemiplegia affecting right dominant side ([address])      Subjective Assessment - 04/01/15 1811    Subjective Pt stating "no" to pain or any changes since last visit. Father present during end of session to discuss new order for brace and also for ankle ASO.    Patient is accompained by: Family member   Limitations Walking;House hold activities;Reading;Standing;Writing   Patient Stated Goals Improve speech, be able to walk better, and get functional use of RUE.    Currently in Pain? No/denies              Orthotic check:  Met with Gerald Stabs from [place] in order to further assess appropriate brace to better control pts increased supination during swing and stance phase of gait due to tone.  Had pt ambulate x 115' with current blue rocker for better understanding of current issue.  Per recommendations, switched to R reaction AFO with supination strap added x 115'.  Note marked improvement with supination control, however pt continues to have increased extension at knee.  Added heel  wedge in addition to brace mentioned above with somewhat improved control, however will need a somewhat smaller heel wedge to prevent increased force of hyperextension.  Also discussed with Gerald Stabs and pt the need for ASO to further help stabilize ankle.  This would be great option for pt at home when he is not wearing brace or shoe (even though not recommended by PT/OT) and would also add stability on top of brace/strap when he is wearing shoes.  Pt agreeable to wear this at home.  Will send additional order to referring MD to get this brace for pt.  Pt and father agree and verbalize understanding.                      PT Education - 04/01/15 1812    Education provided Yes   Education Details Education on importance of wearing ASO if/when ordered to prevent ankle inversion, esp with stance when walking without brace at home.  Also educated on ordering new brace and sending note to MD regarding need for botox shot in R LE to decrease supination tone.    Person(s) Educated Patient;Parent(s)   Methods Explanation;Verbal cues   Comprehension Verbalized understanding;Returned demonstration          PT Short Term Goals - 03/04/15 1035    PT SHORT TERM GOAL #1   Title Pt will improve BERG balance score to 56/56 to decrease  fall risk and improve functional mobility. (Target 03/03/15)   Baseline 55/56 on 03/04/15   Status Not Met   PT SHORT TERM GOAL #2   Title Pt will improve gait speed to 2.83 ft/sec to improve functional ambulation level to community ambulator.   (Target 03/03/15)   Baseline 2.00 ft/sec on 03/04/15, feel that this is due to increased tone.    Status Not Met   PT SHORT TERM GOAL #3   Title Pt will ambulate >500' on indoor and outdoor surfaces including grass, inclines and curb step without AD with AFO at S level without overt LOB to indicate safe negotiation of indoor and outdoor surfaces.  (Target 03/03/15)   Baseline met on 03/03/15, however recommend use of cane at home  for increased safety and to decrease tone with gait.     Status Achieved   PT SHORT TERM GOAL #4   Title Pt will negotiate up/down 4 steps in step to pattern with single rail at mod I level with safe technique to traverse community obstacles.   (Target 03/03/15)   Baseline Met on 9/22.   Status Achieved   PT SHORT TERM GOAL #5   Title Pt will improve 3MWT to 498' to improve ambulation function and functional endurance.    Baseline 366' on 03/03/15   Status Not Met           PT Long Term Goals - 03/18/15 1206    PT LONG TERM GOAL #1   Title Pts SIS mobility score will improve to 80% to indicate improved perceived functional mobility.  (Target 04/16/15)  Target dates updated due to missing 2 wks of PT   Baseline 66.7% on 8/31   Time 8   Period Weeks   PT LONG TERM GOAL #2   Title Pt will improve ankle DF strength to 3/5 in order to increase safety with household ambulation and begin to address ambulation without use of AFO.  (Target 04/16/15)   Baseline 1/5 on 8/31   Time 8   Period Weeks   PT LONG TERM GOAL #3   Title Pt will verbalize ability to return to leisure activities to indicate return to community function.   (Target 04/16/15)   Time 8   Period Weeks   PT LONG TERM GOAL #4   Title Pt will negotiate up/down 4 steps without rail in alternating fashion to traverse community stairs while carrying object.  (Target 04/16/15)   Time 8   Period Weeks   PT LONG TERM GOAL #5   Title Pt will demonstrate ability to ambulate up to 150' with decreased R genu recurvatum to improve R knee stability/strength and prevent knee injury.  (Target Date: 04/16/15)   Baseline 8   Period Weeks   PT LONG TERM GOAL #6   Title Pt will demonstrate ability to perform SLS x 5 secs on RLE to improve balance and awareness of RLE. (Target Date: 04/16/15)   Time 8   Period Weeks               Plan - 04/01/15 1817    Clinical Impression Statement Skilled session with Gerald Stabs from [place] in  order to better assess appropriate AFO for pt to prevent/decrease supination during swing and stance phase of gait.  Feel that reaction AFO w/ supination strap will work best and also addition of ankle ASO for increased support (esp when not wearing shoe or brace at home) and prevent ankle injury.  Also discussed requesting assessment  from Dr. Letta Pate for possible botox injection to decrease tone, causing increased supination and decreased balance as well as increased tone in RUE causing postural dysfunction.     Pt will benefit from skilled therapeutic intervention in order to improve on the following deficits Abnormal gait;Decreased balance;Decreased coordination;Decreased knowledge of precautions;Decreased safety awareness;Decreased strength;Difficulty walking;Impaired perceived functional ability;Impaired sensation;Impaired flexibility;Impaired tone;Impaired UE functional use;Postural dysfunction;Decreased knowledge of use of DME   Rehab Potential Excellent   Clinical Impairments Affecting Rehab Potential adhering to safety precautions   PT Frequency 2x / week   PT Duration 8 weeks   PT Treatment/Interventions ADLs/Self Care Home Management;Electrical Stimulation;Biofeedback;DME Instruction;Gait training;Stair training;Functional mobility training;Therapeutic exercise;Balance training;Neuromuscular re-education;Patient/family education;Manual techniques;Visual/perceptual remediation/compensation   PT Next Visit Plan R knee stability (without AFO), stretching heel cord (ensuring ankle in neutral, eversion stretch, isolated hip/knee flex with arms in shoulder flexed/elbows extended position to decrease retraction on R side, RLE WB.   Consulted and Agree with Plan of Care Patient;Family member/caregiver   Family Member Consulted Father Hendricks Milo)        Problem List Patient Active Problem List   Diagnosis Date Noted  . 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 Left middle  cerebral artery stroke ([place]) 10/21/2014  . Right hemiparesis ([place]) 10/21/2014  . Aphasia due to stroke 10/21/2014    Cameron Sprang, PT, MPT [facility] [address] [place], [place], [address] Phone: [phone]   Fax:  [phone] 04/01/2015, 6:23 PM   Name: Mario [name] MRN: [id number] Date of Birth: [birth year]
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
## 2015-04-01 NOTE — Telephone Encounter (Signed)
Dr. Wynn BankerKirsteins,   I am sure that you remember Mario [name] from his rehab stay a few months ago.  I am now seeing him at [facility] and upon continued visits have noted increased supination tone during swing and stance phase of gait that is causing decreased balance, risk of ankle injury and decreased foot clearance.  Note that this is also causing increased tone in R UE (more so in hand per OT) and causing overall postural dysfunction.  I wanted to reach out to you to see if you would be willing to do an assessment to see if pt appropriate for Botox injection for decreased supination and at hand (per OT).  Please order consult if you agree.    FYI:  I will also forward you our last therapy note.    Thanks, Mario [name], PT, MPT [facility] [address] [place], [place], [address] Phone: [phone]   Fax:  [phone] 04/01/2015, 6:33 PM

## 2015-04-01 NOTE — Patient Instructions (Signed)
  Please complete the assigned speech therapy homework prior to your next session.  

## 2015-04-02 ENCOUNTER — Telehealth: Payer: Self-pay | Admitting: Rehabilitation

## 2015-04-02 NOTE — Telephone Encounter (Signed)
Dr. Sedalia Mutaox,   I am contacting you regarding a brace order for Andrena MewsJerome Basu who is being seen in [facility] PT at this time.  I sent a fax request via Epic a couple of days ago regarding need for R AFO (which I believe he would greatly benefit from) however I also feel that he would greatly benefit from R ASO for increased ankle stability esp when at home not wearing shoe and AFO.  Note that he does already have blue rocker AFO, however do not feel that this is adequately controlling supination tone during swing and stance phase of gait and note better options during therapy sessions for him. [facility] has already been to session to trial brace.   Please place order for both if you agree!  Thanks,  Harriet ButteEmily Barbee Mamula, PT, MPT [facility] [address] [place], [place], [address] Phone: [phone]   Fax:  [phone] 04/02/2015, 11:27 AM

## 2015-04-02 NOTE — Telephone Encounter (Signed)
please have Mario [name] contact our office for a follow-up visit. He never made it to his initial visit. He should have the office information on his discharge papers

## 2015-04-06 ENCOUNTER — Ambulatory Visit: Payer: BC Managed Care – PPO | Attending: Family Medicine | Admitting: Occupational Therapy

## 2015-04-06 ENCOUNTER — Encounter: Payer: Self-pay | Admitting: Rehabilitation

## 2015-04-06 ENCOUNTER — Ambulatory Visit: Payer: BC Managed Care – PPO | Admitting: Rehabilitation

## 2015-04-06 ENCOUNTER — Ambulatory Visit: Payer: BC Managed Care – PPO

## 2015-04-06 ENCOUNTER — Encounter: Payer: Self-pay | Admitting: Occupational Therapy

## 2015-04-06 DIAGNOSIS — R201 Hypoesthesia of skin: Secondary | ICD-10-CM | POA: Diagnosis present

## 2015-04-06 DIAGNOSIS — I69898 Other sequelae of other cerebrovascular disease: Secondary | ICD-10-CM | POA: Insufficient documentation

## 2015-04-06 DIAGNOSIS — M6281 Muscle weakness (generalized): Secondary | ICD-10-CM | POA: Diagnosis present

## 2015-04-06 DIAGNOSIS — R269 Unspecified abnormalities of gait and mobility: Secondary | ICD-10-CM | POA: Insufficient documentation

## 2015-04-06 DIAGNOSIS — R4189 Other symptoms and signs involving cognitive functions and awareness: Secondary | ICD-10-CM | POA: Diagnosis present

## 2015-04-06 DIAGNOSIS — R2681 Unsteadiness on feet: Secondary | ICD-10-CM | POA: Insufficient documentation

## 2015-04-06 DIAGNOSIS — G8111 Spastic hemiplegia affecting right dominant side: Secondary | ICD-10-CM | POA: Diagnosis not present

## 2015-04-06 DIAGNOSIS — R482 Apraxia: Secondary | ICD-10-CM

## 2015-04-06 DIAGNOSIS — R4701 Aphasia: Secondary | ICD-10-CM

## 2015-04-06 DIAGNOSIS — IMO0002 Reserved for concepts with insufficient information to code with codable children: Secondary | ICD-10-CM

## 2015-04-06 DIAGNOSIS — R531 Weakness: Secondary | ICD-10-CM | POA: Insufficient documentation

## 2015-04-06 NOTE — Therapy (Signed)
[facility] [address] [place], [place], [address] Phone: [phone]   Fax:  [phone]  Physical Therapy Treatment  Patient Details  Name: Mario [name] MRN: [id number] Date of Birth: Jan 29, 1976 No Data Recorded  Encounter Date: 04/06/2015      PT End of Session - 04/06/15 1749    Visit Number 14   Number of Visits 17   Date for PT Re-Evaluation 04/16/15   PT Start Time 1157   PT Stop Time 1530   PT Time Calculation (min) 45 min   Activity Tolerance Patient tolerated treatment well   Behavior During Therapy [facility] for tasks assessed/performed      Past Medical History  Diagnosis Date  . Hypertension     Past Surgical History  Procedure Laterality Date  . No past surgeries      There were no vitals filed for this visit.  Visit Diagnosis:  Abnormality of gait  Unsteadiness  Muscle weakness  Spastic hemiplegia affecting right dominant side (HCC)      Subjective Assessment - 04/06/15 1747    Subjective Pt stating "no" to pain or any changes since last visit. Father present during end of session to discuss that brace should be delivered soon as order was received and faxed.     Patient is accompained by: Family member   Limitations Walking;House hold activities;Reading;Standing;Writing   Patient Stated Goals Improve speech, be able to walk better, and get functional use of RUE.    Currently in Pain? No/denies             Self Care:  Initiated education and practice with pt donning new R reaction AFO with supination strap to ensure independence when gets brace for home use.  Requires min A at times with cues for ensuring proper strap placement and proper order in which to strap them.  Will continue to practice this during PT and OT sessions.    Gait:  Performed gait x 345' without AD at mod I level with reaction AFO/supination strap at slower gait speed in order to address increased R knee  control during stance.  Provided light tactile cuing at back of knee as well as verbal cues throughout.  Transitioned to facilitation at R trunk and L shoulder for improved postural alignment and better R foot clearance during swing.    NMR:  Addressed R knee control during WB and closed chain activities beginning in // bars with RLE on 4"step raising up and tapping L heel down and back to R knee extension again.  Performed initially with BUE support>single UE support however noted over use of LUE with increased trunk compensations, therefore transitioned LUE to therapist shoulder for light support as well as at R knee/trunk to decrease knee extension and decrease trunk compensations.  Progressed to performing lateral and posterior lunges with LLE moving laterally or posteriorly to increase R knee control.  During lunges also had pt support UEs on ball to facilitate upright postural control.  Increased assist needed for RUE esp with increased difficulty of task.                        PT Education - 04/06/15 1748    Education provided Yes   Education Details Education regarding ordering new brace and scheduling appt with MD regarding botox assessment.    Person(s) Educated Patient;Parent(s)   Methods Explanation   Comprehension Verbalized understanding  PT Short Term Goals - 03/04/15 1035    PT SHORT TERM GOAL #1   Title Pt will improve BERG balance score to 56/56 to decrease fall risk and improve functional mobility. (Target 03/03/15)   Baseline 55/56 on 03/04/15   Status Not Met   PT SHORT TERM GOAL #2   Title Pt will improve gait speed to 2.83 ft/sec to improve functional ambulation level to community ambulator.   (Target 03/03/15)   Baseline 2.00 ft/sec on 03/04/15, feel that this is due to increased tone.    Status Not Met   PT SHORT TERM GOAL #3   Title Pt will ambulate >500' on indoor and outdoor surfaces including grass, inclines and curb step without AD with  AFO at S level without overt LOB to indicate safe negotiation of indoor and outdoor surfaces.  (Target 03/03/15)   Baseline met on 03/03/15, however recommend use of cane at home for increased safety and to decrease tone with gait.     Status Achieved   PT SHORT TERM GOAL #4   Title Pt will negotiate up/down 4 steps in step to pattern with single rail at mod I level with safe technique to traverse community obstacles.   (Target 03/03/15)   Baseline Met on 9/22.   Status Achieved   PT SHORT TERM GOAL #5   Title Pt will improve 3MWT to 498' to improve ambulation function and functional endurance.    Baseline 366' on 03/03/15   Status Not Met           PT Long Term Goals - 03/18/15 1206    PT LONG TERM GOAL #1   Title Pts SIS mobility score will improve to 80% to indicate improved perceived functional mobility.  (Target 04/16/15)  Target dates updated due to missing 2 wks of PT   Baseline 66.7% on 8/31   Time 8   Period Weeks   PT LONG TERM GOAL #2   Title Pt will improve ankle DF strength to 3/5 in order to increase safety with household ambulation and begin to address ambulation without use of AFO.  (Target 04/16/15)   Baseline 1/5 on 8/31   Time 8   Period Weeks   PT LONG TERM GOAL #3   Title Pt will verbalize ability to return to leisure activities to indicate return to community function.   (Target 04/16/15)   Time 8   Period Weeks   PT LONG TERM GOAL #4   Title Pt will negotiate up/down 4 steps without rail in alternating fashion to traverse community stairs while carrying object.  (Target 04/16/15)   Time 8   Period Weeks   PT LONG TERM GOAL #5   Title Pt will demonstrate ability to ambulate up to 150' with decreased R genu recurvatum to improve R knee stability/strength and prevent knee injury.  (Target Date: 04/16/15)   Baseline 8   Period Weeks   PT LONG TERM GOAL #6   Title Pt will demonstrate ability to perform SLS x 5 secs on RLE to improve balance and awareness of  RLE. (Target Date: 04/16/15)   Time 8   Period Weeks               Plan - 04/06/15 1749    Clinical Impression Statement Skilled session focused on pt ability to don newer reaction R AFO with supination strap as well as continued gait training with brace.  Still needs continued work with R knee control, therefore addressed during  session with closed chain activiites.    Pt will benefit from skilled therapeutic intervention in order to improve on the following deficits Abnormal gait;Decreased balance;Decreased coordination;Decreased knowledge of precautions;Decreased safety awareness;Decreased strength;Difficulty walking;Impaired perceived functional ability;Impaired sensation;Impaired flexibility;Impaired tone;Impaired UE functional use;Postural dysfunction;Decreased knowledge of use of DME   Rehab Potential Excellent   Clinical Impairments Affecting Rehab Potential adhering to safety precautions   PT Frequency 2x / week   PT Duration 8 weeks   PT Treatment/Interventions ADLs/Self Care Home Management;Electrical Stimulation;Biofeedback;DME Instruction;Gait training;Stair training;Functional mobility training;Therapeutic exercise;Balance training;Neuromuscular re-education;Patient/family education;Manual techniques;Visual/perceptual remediation/compensation   PT Next Visit Plan Begin to think about re-cert or putting on hold until gets botox.  Gait training with new AFO if present.  R knee stability (without AFO), stretching heel cord (ensuring ankle in neutral, eversion stretch, isolated hip/knee flex with arms in shoulder flexed/elbows extended position to decrease retraction on R side, RLE WB.   Consulted and Agree with Plan of Care Patient;Family member/caregiver        Problem List Patient Active Problem List   Diagnosis Date Noted  . 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 Left middle cerebral artery stroke ([place]) 10/21/2014  . Right hemiparesis ([place])  10/21/2014  . Aphasia due to stroke 10/21/2014    Cameron Sprang, PT, MPT [facility] [address] [place], [place], [address] Phone: [phone]   Fax:  [phone] 04/06/2015, 5:54 PM  Name: LAZER WOLLARD MRN: [id number] Date of Birth: 1975/09/14

## 2015-04-06 NOTE — Patient Instructions (Signed)
Upgrade to Home Exercise program for your arm:  Dp this 2 times per day.   1. Lay on your back and hold a two pound dumb bell in both of your hands, palms facing each other.  Slowly raise your arms toward the ceiling until the elbows are straight. KEEPING YOUR ELBOWS STRAIGHT, raise the dumb bell over your head as far as you can without bending your elbows. Then return weight to starting position.  Do 10, REST then do 10 more. MOVE SLOWLY!!!! Fast movements will increase your tone and make your arm tight.  Have your dad "spot" you so that your arm doesn't give out.

## 2015-04-06 NOTE — Therapy (Signed)
[facility] [address] [place], [place], [address] Phone: [phone]   Fax:  [phone]  Occupational Therapy Treatment  Patient Details  Name: Mario [name] MRN: [id number] Date of Birth: 1975/09/06 Referring Provider: Blane Ohara  Encounter Date: 04/06/2015      OT End of Session - 04/06/15 1641    Visit Number 18   Number of Visits 32   Date for OT Re-Evaluation 04/01/15   Authorization Type [employer] state health PPO no visit limit   OT Start Time 1531   OT Stop Time 1620   OT Time Calculation (min) 49 min   Activity Tolerance Patient tolerated treatment well      Past Medical History  Diagnosis Date  . Hypertension     Past Surgical History  Procedure Laterality Date  . No past surgeries      There were no vitals filed for this visit.  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   Pertinent History See epic snaphshot, L MCA CVA, HTN   Patient Stated Goals Per father, speech, RUE and walking to improve   Currently in Pain? No/denies                      OT Treatments/Exercises (OP) - 04/06/15 0001    Neurological Re-education Exercises   Other Exercises 1 Neuro re ed to upgrade HEP to address adding weight to supine overhead execise to increase scapular stability with emphasis on over riding flexor synergy tone to work toward shoulder flexion with straight elbow and neutral shoulder rotation. Pt needs min facilitation and father instructed in how to assist.  Pt much better at monitoring quality of movement.  Pt with improving proprioception in RUE.  See pt instruction for details. Reviewed all new goals with pt - pt in agreement. Also  practiced doffing new LE brace. Pt able to do this mod I however needs vc's and encouragement to incoporate RUE.                 OT Education - 04/06/15 1639    Education provided Yes   Education Details Upgraded HEP for RUE   Person(s) Educated Patient;Parent(s)   Methods Explanation;Demonstration;Verbal cues;Handout;Tactile cues   Comprehension Verbalized understanding;Returned demonstration          OT Short Term Goals - 04/06/15 1639    OT SHORT TERM GOAL #1   Title Pt will  and family member will be mod I with HEP - 03/04/2015   Status Achieved   OT SHORT TERM GOAL #2   Title Pt will demonstrate ability to complete shoulder flexion to 50* without eblow flexion in blilateral reach to work toward functional low reach with RUE.   Status Achieved   OT SHORT TERM GOAL #3   Title Pt will demonstrate understanding of AE for cutting and tying shoes   Status Achieved   OT SHORT TERM GOAL #4   Title Pt will demonstrate beginning isolated grasp in R hand in prepartion for RUE use in functional tasks.   Status Achieved   OT SHORT TERM GOAL #5   Title Pt will demonstrate ability to use RUE as stabilizer at least 50% of the time during basic ADL  tasks.   Status Achieved  pt able to use as gross assist for some parts of ADL activity   OT SHORT TERM GOAL #6   Title Pt will be mod I with upgraded HEP - 04/29/2015   Status On-going   OT SHORT TERM GOAL #7   Title Pt will demonstrate ability to cast fishing pole with RUE with no more than mod assist   Status On-going   OT SHORT TERM GOAL #8   Title Pt will be able to use RUE as stabilizer during hot meal prep   Status On-going   OT SHORT TERM GOAL  #9   TITLE Pt will demonstrate ability to pick up and place light object at 90* of reach with RUE with min compensations.   Status On-going           OT Long Term Goals - 04/06/15 1640    OT LONG TERM GOAL #1   Title Pt and family will be mod I with updated HEP - 04/01/2015    Status Achieved   OT LONG TERM GOAL #2   Title Pt will demonstrate ability for alternating attention for familiar functional tasks (i.e. cooking task)   Status Achieved   OT LONG TERM GOAL #3   Title Pt will demonstrate ability for low unilateral reach with min a  for hand grasp only during a functional task    Status Achieved   OT LONG TERM GOAL #4   Title Pt will demonstrate ability to use RUE as gross assist for at least 50% of basic self care tasks.   Status Achieved   OT LONG TERM GOAL #5   Title Pt will be able to grasp light cylindrical object with no more than min a.   Status Achieved   OT LONG TERM GOAL #6   Title Pt will be able to cast a fisihing pole with RUE with no more than mod a.  moved to STG on 03/30/2015   Status Deferred   OT LONG TERM GOAL #7   Title Pt will be mod I with hot meal prep   Status Achieved   OT LONG TERM GOAL #8   Title Pt will be mod I with upgraded HEP prn - 05/27/2015   Status On-going   OT LONG TERM GOAL  #9   Baseline Pt will demonstrate ability to overhead reach to grasp light object off shelf with RUE with no more than min facilitation   Status On-going   OT LONG TERM GOAL  #10   TITLE Pt will demostrate ability to use RUE as gross assist for 100% of basic self care tasks.   Status On-going   OT LONG TERM GOAL  #11   TITLE Pt will be able to use RUE as gross assist during hot meal prep   Status On-going               Plan - 04/06/15 1640    Clinical Impression Statement Pt progressing toward new STG's. Pt's HEP for RUE was upgraded today and pt and father both educated.    Pt will benefit from skilled therapeutic intervention in order to improve on the following deficits (Retired) Abnormal gait;Decreased balance;Decreased coordination;Decreased cognition;Decreased knowledge of use of DME;Decreased mobility;Decreased range of motion;Decreased strength;Increased edema;Impaired UE functional use;Impaired tone;Impaired sensation;Pain    Rehab Potential Good   Clinical Impairments Affecting Rehab Potential global aphasia, apraxia, impaired sensation   OT Frequency 2x / week   OT Duration 8 weeks  OT Treatment/Interventions Self-care/ADL training;Ultrasound;Moist Heat;Electrical Stimulation;DME and/or AE instruction;Neuromuscular education;Therapeutic exercise;Functional Mobility Training;Manual Therapy;Passive range of motion;Splinting;Therapeutic activities;Balance training;Patient/family education;Cognitive remediation/compensation   Plan NMR to RUE for reach in supine, sitting and standing, grasp and release, balance and use of RUE in standing   Consulted and Agree with Plan of Care Patient   Family Member Consulted father        Problem List Patient Active Problem List   Diagnosis Date Noted  . 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 Right hemiparesis (HCC) 10/21/2014  . Aphasia due to stroke 10/21/2014    Norton Pastel, OTR/L 04/06/2015, 4:43 PM  [facility] [address] [place], [place], [address] Phone: [phone]   Fax:  [phone]  Name: RANE BLITCH MRN: [id number] Date of Birth: Sep 11, 1975

## 2015-04-07 NOTE — Patient Instructions (Signed)
  Please complete the assigned speech therapy homework and return it to your next session.  

## 2015-04-07 NOTE — Therapy (Signed)
[facility] [address] [place], [place], [address] Phone: [phone]   Fax:  [phone]  Speech Language Pathology Treatment  Patient Details  Name: Mario [name] MRN: [id number] Date of Birth: August 14, 1975 No Data Recorded  Encounter Date: 04/06/2015      End of Session - 04/07/15 5027    Visit Number 1   Number of Visits 8   Date for SLP Re-Evaluation 06/04/15   SLP Start Time 79   SLP Stop Time  1445   SLP Time Calculation (min) 43 min   Activity Tolerance Patient tolerated treatment well      Past Medical History  Diagnosis Date  . Hypertension     Past Surgical History  Procedure Laterality Date  . No past surgeries      There were no vitals filed for this visit.  Visit Diagnosis: Expressive aphasia  Receptive aphasia  Verbal apraxia      Subjective Assessment - 04/06/15 1407    Subjective "Hey."               ADULT SLP TREATMENT - 04/06/15 1407    General Information   Behavior/Cognition Alert;Cooperative;Pleasant mood   Treatment Provided   Treatment provided Cognitive-Linquistic   Pain Assessment   Pain Assessment No/denies pain   Cognitive-Linquistic Treatment   Treatment focused on Apraxia;Aphasia   Skilled Treatment One word responses to questions re: calendar (months, seasons, days) with 15-20% success. Wrote answers functionally 40% of the time with context known. Writing was an augmentative means of commnication that incr'd communication. Pt answered "wh" quesitons in simple conversation with writing augmented with rare min A. Pt used multimodal communication with mod SLP questioning cues for family questions by SLP (siblings, where live, jobs). Pt demo'd comprehension of these simple questions at 80-85%.   Assessment / Recommendations / Plan   Plan Continue with current plan of care   Progression Toward Goals   Progression toward goals Progressing toward goals           SLP Education - 04/07/15 0921    Education provided Yes   Education Details encouraged multimodal communication   Person(s) Educated Patient   Methods Explanation   Comprehension Verbalized understanding          SLP Short Term Goals - 04/06/15 1411    SLP SHORT TERM GOAL #1   Title pt to perform rote speech tasks (simultaneous with SLP/family) with 25% success, functional responses allowed   Period --  or 8 visits - for all STGs   Status Achieved   SLP SHORT TERM GOAL #2   Title pt will imitate consonant vowel syllables/words with written, picture, and other nonverbal cues, 60% success, functional responses allowed   Status Achieved   SLP SHORT TERM GOAL #3   Title pt will functionally communicate with simple augmentative/alternative device (via pictures/texting/typing) for basic wants/needs 60% success, in viable opportunities   Status Achieved   SLP SHORT TERM GOAL #4   Title pt will demo understanding of simple conversational questions via yes/no responses at 80% accuracy   Status Achieved   SLP SHORT TERM GOAL #5   Title pt will write name and address with rare nonverbal cues and no signs aphasia   Status Not Met   SLP SHORT TERM GOAL #6   Title pt will provide functional multimodal responses in simple conversation with occasional min A   Baseline for 4 weeks or 8 visits, for all STGs   Time  4   Period Weeks   Status New   SLP SHORT TERM GOAL #7   Title pt will demo understanding of mod complex conversation with repeats allowed 90%   Time 4   Period Weeks   Status New   SLP SHORT TERM GOAL #8   Title pt will write one word responses to communicate functionally, with multiple attempts allowed, achieving 75% listener comprehension   Time 4   Period Weeks   Status New          SLP Long Term Goals - 04/06/15 2080    SLP LONG TERM GOAL #1   Title pt will demo undertanding of simple/mod complex conversational questions via yes/no responses 80% accuracy    Period --  or 16 visits - for all LTGs   Status Achieved   SLP LONG TERM GOAL #2   Title pt will demo undertanding of simple conversational quesitons via yes/no responses 90% accuracy   Status Achieved   SLP LONG TERM GOAL #3   Title pt will perform rote speech tasks simultaneously with SLP with 40% accuracy (functional responses allowed)   Status Not Met   SLP LONG TERM GOAL #4   Title pt will imitate salient words/family names with 20% success   Status Achieved   SLP LONG TERM GOAL #5   Title pt will demo use of simple alternative/augmentative communcation and/or multimodal communication with 80% success in mod complex message conveyance   Baseline 8 more weeks or 16 more visits   Time 8   Status Revised   SLP LONG TERM GOAL #6   Title pt will state family names/information and other salient personal 2-3 word information with 80% success   Time 8   Period Weeks   Status New          Plan - 04/07/15 2233    Clinical Impression Statement Pt's comprehension of simple conversation today demo'd at 80-85%. He used multimodal communcation with simple conversation, with SLP incorporating mod cues. Skilled ST cont to be needed due to significant speech/language deficits, both receptive and expressive. SLP to [facility] high-tech communication device to pt and provide with handouts.   Speech Therapy Frequency 2x / week   Duration 4 weeks  or 8 visits   Treatment/Interventions SLP instruction and feedback;Compensatory strategies;Internal/external aids;Environmental controls;Patient/family education;Functional tasks;Cueing hierarchy;Multimodal communcation approach   Potential to Achieve Goals Fair   Potential Considerations Severity of impairments        Problem List Patient Active Problem List   Diagnosis Date Noted  . 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 Left middle cerebral artery stroke ([place]) 10/21/2014  . Right hemiparesis ([place]) 10/21/2014  .  Aphasia due to stroke 10/21/2014    [facility] , MS, CCC-SLP  04/07/2015, 9:27 AM  [facility] [address] [place], [place], [address] Phone: [phone]   Fax:  [phone]   Name: Mario [name] MRN: [id number] Date of Birth: 05-10-1976

## 2015-04-08 ENCOUNTER — Encounter: Payer: Self-pay | Admitting: Occupational Therapy

## 2015-04-08 ENCOUNTER — Ambulatory Visit: Payer: BC Managed Care – PPO | Admitting: Physical Therapy

## 2015-04-08 ENCOUNTER — Ambulatory Visit: Payer: BC Managed Care – PPO | Admitting: Occupational Therapy

## 2015-04-08 ENCOUNTER — Ambulatory Visit: Payer: BC Managed Care – PPO

## 2015-04-08 DIAGNOSIS — R201 Hypoesthesia of skin: Secondary | ICD-10-CM

## 2015-04-08 DIAGNOSIS — R4189 Other symptoms and signs involving cognitive functions and awareness: Secondary | ICD-10-CM

## 2015-04-08 DIAGNOSIS — IMO0002 Reserved for concepts with insufficient information to code with codable children: Secondary | ICD-10-CM

## 2015-04-08 DIAGNOSIS — M6281 Muscle weakness (generalized): Secondary | ICD-10-CM

## 2015-04-08 DIAGNOSIS — G8111 Spastic hemiplegia affecting right dominant side: Secondary | ICD-10-CM

## 2015-04-08 DIAGNOSIS — R2681 Unsteadiness on feet: Secondary | ICD-10-CM

## 2015-04-08 DIAGNOSIS — R4701 Aphasia: Secondary | ICD-10-CM

## 2015-04-08 DIAGNOSIS — R269 Unspecified abnormalities of gait and mobility: Secondary | ICD-10-CM

## 2015-04-08 DIAGNOSIS — R482 Apraxia: Secondary | ICD-10-CM

## 2015-04-08 NOTE — Therapy (Signed)
[facility] [address] [place], [place], [address] Phone: [phone]   Fax:  [phone]  Physical Therapy Treatment  Patient Details  Name: Mario [name] MRN: [id number] Date of Birth: Jul 12, 1975 No Data Recorded  Encounter Date: 04/08/2015      PT End of Session - 04/08/15 2025    Visit Number 15   Number of Visits 17   Date for PT Re-Evaluation 04/16/15   PT Start Time 0845   PT Stop Time 0930   PT Time Calculation (min) 45 min   Activity Tolerance Patient tolerated treatment well   Behavior During Therapy [facility] for tasks assessed/performed      Past Medical History  Diagnosis Date  . Hypertension     Past Surgical History  Procedure Laterality Date  . No past surgeries      There were no vitals filed for this visit.  Visit Diagnosis:  Abnormality of gait  Unsteadiness  Spastic hemiplegia affecting right dominant side (HCC)      Subjective Assessment - 04/08/15 0937    Subjective Denies pain, significant changes, and falls since last PT session.    Limitations Walking;House hold activities;Reading;Standing;Writing   Patient Stated Goals Improve speech, be able to walk better, and get functional use of RUE.    Currently in Pain? No/denies                         [facility] Adult PT Treatment/Exercise - 04/08/15 0001    Ambulation/Gait   Ambulation/Gait Yes   Ambulation/Gait Assistance 5: Supervision;6: Modified independent (Device/Increase time)   Ambulation Distance (Feet) 630 Feet  prior to onset of fatigue, GR   Assistive device None  no AFO   Gait Pattern Decreased arm swing - right;Step-through pattern;Decreased stance time - right;Decreased dorsiflexion - right;Decreased weight shift to right;Right genu recurvatum;Poor foot clearance - right;Right foot flat   Ambulation Surface Level;Indoor;Outdoor;Paved;Unlevel   Gait Comments All gait training and NMR performed with  NMES on R distal hamstrings (medial) and R peroneals for increased R stance stability/knee control and R subtalar eversion, respectively.   Neuro Re-ed    Neuro Re-ed Details  With NMES on RLE as described above, pt performed  squats x12, x9 with greeen Tband at R knee for proprioceptive input, demonstration cues for decreased quadriceps dominance, and tactile cueing for increased R hamstring activation/control. With Tband still at R knee (anchored to mat table behind pt), pt performed LLE tapping on targets of varying heights to increase R knee control. Pt required multimodal cueing to prevent R genu recurvatum. At end of session, pt able to consistently perform LLE tapping onto 4" step without AFO or NMES with no L geu recurvatum.                             PT Short Term Goals - 03/04/15 1035    PT SHORT TERM GOAL #1   Title Pt will improve BERG balance score to 56/56 to decrease fall risk and improve functional mobility. (Target 03/03/15)   Baseline 55/56 on 03/04/15   Status Not Met   PT SHORT TERM GOAL #2   Title Pt will improve gait speed to 2.83 ft/sec to improve functional ambulation level to community ambulator.   (Target 03/03/15)   Baseline 2.00 ft/sec on 03/04/15, feel that this is due to increased tone.    Status Not Met  PT SHORT TERM GOAL #3   Title Pt will ambulate >500' on indoor and outdoor surfaces including grass, inclines and curb step without AD with AFO at S level without overt LOB to indicate safe negotiation of indoor and outdoor surfaces.  (Target 03/03/15)   Baseline met on 03/03/15, however recommend use of cane at home for increased safety and to decrease tone with gait.     Status Achieved   PT SHORT TERM GOAL #4   Title Pt will negotiate up/down 4 steps in step to pattern with single rail at mod I level with safe technique to traverse community obstacles.   (Target 03/03/15)   Baseline Met on 9/22.   Status Achieved   PT SHORT TERM GOAL #5   Title Pt  will improve 3MWT to 498' to improve ambulation function and functional endurance.    Baseline 366' on 03/03/15   Status Not Met           PT Long Term Goals - 03/18/15 1206    PT LONG TERM GOAL #1   Title Pts SIS mobility score will improve to 80% to indicate improved perceived functional mobility.  (Target 04/16/15)  Target dates updated due to missing 2 wks of PT   Baseline 66.7% on 8/31   Time 8   Period Weeks   PT LONG TERM GOAL #2   Title Pt will improve ankle DF strength to 3/5 in order to increase safety with household ambulation and begin to address ambulation without use of AFO.  (Target 04/16/15)   Baseline 1/5 on 8/31   Time 8   Period Weeks   PT LONG TERM GOAL #3   Title Pt will verbalize ability to return to leisure activities to indicate return to community function.   (Target 04/16/15)   Time 8   Period Weeks   PT LONG TERM GOAL #4   Title Pt will negotiate up/down 4 steps without rail in alternating fashion to traverse community stairs while carrying object.  (Target 04/16/15)   Time 8   Period Weeks   PT LONG TERM GOAL #5   Title Pt will demonstrate ability to ambulate up to 150' with decreased R genu recurvatum to improve R knee stability/strength and prevent knee injury.  (Target Date: 04/16/15)   Baseline 8   Period Weeks   PT LONG TERM GOAL #6   Title Pt will demonstrate ability to perform SLS x 5 secs on RLE to improve balance and awareness of RLE. (Target Date: 04/16/15)   Time 8   Period Weeks               Plan - 04/08/15 2026    Clinical Impression Statement Session focused on increasing proprioceptive input at R knee, activation of R hamstrings to improve R stance control during dynamic standing and mobility. Noted within-session carryover of R knee control during structured task; however, did not carry over to gait post-session. Will continue to reinforce.   Pt will benefit from skilled therapeutic intervention in order to improve on the  following deficits Abnormal gait;Decreased balance;Decreased coordination;Decreased knowledge of precautions;Decreased safety awareness;Decreased strength;Difficulty walking;Impaired perceived functional ability;Impaired sensation;Impaired flexibility;Impaired tone;Impaired UE functional use;Postural dysfunction;Decreased knowledge of use of DME   Rehab Potential Excellent   Clinical Impairments Affecting Rehab Potential adhering to safety precautions   PT Frequency 2x / week   PT Duration 8 weeks   PT Treatment/Interventions ADLs/Self Care Home Management;Electrical Stimulation;Biofeedback;DME Instruction;Gait training;Stair training;Functional mobility training;Therapeutic exercise;Balance training;Neuromuscular re-education;Patient/family education;Manual  techniques;Visual/perceptual remediation/compensation   PT Next Visit Plan Recert vs. hold PT pending botox. Gait training with new AFO if present.  R knee stability (without AFO), stretching heel cord (ensuring ankle in neutral, eversion stretch, isolated hip/knee flex with arms in shoulder flexed/elbows extended position to decrease retraction on R side, RLE WB.   Consulted and Agree with Plan of Care Patient        Problem List Patient Active Problem List   Diagnosis Date Noted  . 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 Left middle cerebral artery stroke ([place]) 10/21/2014  . Right hemiparesis ([place]) 10/21/2014  . Aphasia due to stroke 10/21/2014    Billie Ruddy, PT, DPT [facility] [address] [place], [place], [address] Phone: [phone]   Fax:  [phone] 04/08/2015, 8:30 PM   Name: Mario [name] MRN: [id number] Date of Birth: 1975/06/30

## 2015-04-08 NOTE — Patient Instructions (Signed)
Upgrade to home program for right arm. Add this exercise to your current activities. Do 2 times per day.   DO NOT DO  MORE THAN WHAT IS LISTED BELOW!  Stop if you get pain and let me know.  1. Sit on a firm surface (not too low). Hold your cane with the "hook" end in your right hand. Reach low toward the floor with your arms (do not bend your trunk). KEEPING ARMS LONG AND STRAIGHT, raise cane up using both hands to about shoulder level then return to starting position. Do 5, rest, then 5 rest then final 5 (5 reps x3 sets). Make sure you go SLOW and take the rests to keep the arm from getting too tight.

## 2015-04-08 NOTE — Therapy (Signed)
[facility] [address] [place], [place], [address] Phone: [phone]   Fax:  [phone]  Occupational Therapy Treatment  Patient Details  Name: Mario [name] MRN: [id number] Date of Birth: [birth year] Referring Provider: Blane Ohara  Encounter Date: 04/08/2015      OT End of Session - 04/08/15 1242    Visit Number 19   Number of Visits 32   Date for OT Re-Evaluation 04/01/15   Authorization Type [employer] state health PPO no visit limit   OT Start Time 1017   OT Stop Time 1100   OT Time Calculation (min) 43 min   Activity Tolerance Patient tolerated treatment well      Past Medical History  Diagnosis Date  . Hypertension     Past Surgical History  Procedure Laterality Date  . No past surgeries      There were no vitals filed for this visit.  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    Pertinent History See epic snaphshot, L MCA CVA, HTN   Patient Stated Goals Per father, speech, RUE and walking to improve   Currently in Pain? No/denies                      OT Treatments/Exercises (OP) - 04/08/15 0001    Neurological Re-education Exercises   Other Exercises 1 Neuro re ed to address low to mid unilateral reach. Pt needs mod facilitation to open hand to grasp object but can then use RUE to grasp, hold and  place object on shelf at approximately 65* of shoulder flexion with min compensations. Pt benefits from signficant repetition due to apraxia, impaired sensation, impaired language and tone.  Also addresed continued upgrading of HEP - see pt instruction for detail. Pt now able to complete bilateral closed chain reaching to 90*  activity in sitting (vs supine) with min compensations. Pt needs vc's to over ride elbow flexion with bilateral reach and to monitor quality of movement.  Pt's dad present at end of session and demonstrated new upgrade as well as dad's role in assisting by monitoring and providing feedback.  Pt and dad able to return demonstrate. Also addressed reaching out of midline to both sides with incoproration of grasp and release as well as sliding light object.  Pt needs min a to reach to right side of body. Pt also needs min-mod facilitation for release of objects.                 OT Education - 04/08/15 1206    Education provided Yes   Education Details addiitional upgrade to UE HEP   Person(s) Educated Patient;Parent(s)   Methods Explanation;Demonstration;Verbal cues;Handout;Tactile cues   Comprehension Verbalized understanding;Returned demonstration  with dad          OT Short Term Goals - 04/08/15 1206    OT SHORT TERM GOAL #5   Status --  pt able to use as gross assist for some parts of ADL activity   OT SHORT TERM GOAL #6   Title Pt will be mod I with upgraded HEP - 04/29/2015   Status On-going   OT SHORT TERM GOAL #7   Title Pt will  demonstrate ability to cast fishing pole with RUE with no more than mod assist   Status On-going   OT SHORT TERM GOAL #8   Title Pt will be able to use RUE as stabilizer during hot meal prep   Status On-going   OT SHORT TERM GOAL  #9   TITLE Pt will demonstrate ability to pick up and place light object at 90* of reach with RUE with min compensations.   Status On-going           OT Long Term Goals - 04/08/15 1207    OT LONG TERM GOAL #6   Title --  moved to [facility] on 03/30/2015   OT LONG TERM GOAL #8   Title Pt will be mod I with upgraded HEP prn - 05/27/2015   Status On-going   OT LONG TERM GOAL  #9   Baseline Pt will demonstrate ability to overhead reach to grasp light object off shelf with RUE with no more than min facilitation    Status On-going   OT LONG TERM GOAL  #10   TITLE Pt will demostrate ability to use RUE as gross assist for 100% of basic self care tasks.   Status On-going   OT LONG TERM GOAL  #11   TITLE Pt will be able to use RUE as gross assist during hot meal prep   Status On-going               Plan - 04/08/15 1237    Clinical Impression Statement Pt is progressing toward goals and able to add additional activities to HEP for RUE.  Pt very motivated to carryover HEP.   Pt will benefit from skilled therapeutic intervention in order to improve on the following deficits (Retired) Abnormal gait;Decreased balance;Decreased coordination;Decreased cognition;Decreased knowledge of use of DME;Decreased mobility;Decreased range of motion;Decreased strength;Increased edema;Impaired UE functional use;Impaired tone;Impaired sensation;Pain   Rehab Potential Good   Clinical Impairments Affecting Rehab Potential global aphasia, apraxia, impaired sensation   OT Frequency 2x / week   OT Duration 8 weeks   OT Treatment/Interventions Self-care/ADL training;Ultrasound;Moist Heat;Electrical Stimulation;DME and/or AE instruction;Neuromuscular education;Therapeutic exercise;Functional Mobility Training;Manual Therapy;Passive range of motion;Splinting;Therapeutic activities;Balance training;Patient/family education;Cognitive remediation/compensation   Plan NMR to RUE for reach in supine, sitting and standing, grasp and release, balance and use of RUE in standing   Consulted and Agree with Plan of Care Patient   Family Member Consulted father        Problem List Patient Active Problem List   Diagnosis Date Noted  . 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 Right hemiparesis (HCC) 10/21/2014  . Aphasia due to stroke 10/21/2014    Mackie Paiulaski, [place] [facility],OTR/L 04/08/2015, 12:45 PM  [facility] [address] [place], [place], [address] Phone: [phone]   Fax:  [phone]  Name: Mario [name] MRN: [id number] Date of Birth: Jun 10, 1975

## 2015-04-08 NOTE — Therapy (Signed)
[facility] [address] [place], [place], [address] Phone: [phone]   Fax:  [phone]  Speech Language Pathology Treatment  Patient Details  Name: Mario [name] MRN: [id number] Date of Birth: 08-13-1975 No Data Recorded  Encounter Date: 04/08/2015      End of Session - 04/08/15 1134    Visit Number 1   Number of Visits 16   Date for SLP Re-Evaluation 06/04/15   SLP Start Time 0934   SLP Stop Time  1016   SLP Time Calculation (min) 42 min   Activity Tolerance Patient tolerated treatment well      Past Medical History  Diagnosis Date  . Hypertension     Past Surgical History  Procedure Laterality Date  . No past surgeries      There were no vitals filed for this visit.  Visit Diagnosis: Expressive aphasia  Receptive aphasia  Verbal apraxia      Subjective Assessment - 04/08/15 0942    Subjective "Yes" (greeting)               ADULT SLP TREATMENT - 04/08/15 0943    General Information   Behavior/Cognition Alert;Cooperative;Pleasant mood   Treatment Provided   Treatment provided Cognitive-Linquistic   Pain Assessment   Pain Assessment No/denies pain   Cognitive-Linquistic Treatment   Treatment focused on Aphasia;Apraxia   Skilled Treatment Named pictures with approx 30% functional speech independently. Initial phoneme often incorrect but visual and verbal cues helped. Pt wrote the name of the picture with 15% success, and then spoke the name again approx 30% sucess, functionally. Pt wrote 3 letter words with 88% success, independently- max-total A needed to fully complete.   Assessment / Recommendations / Plan   Plan Continue with current plan of care   Progression Toward Goals   Progression toward goals Progressing toward goals          SLP Education - 04/07/15 0921    Education provided Yes   Education Details encouraged multimodal communication   Person(s) Educated  Patient   Methods Explanation   Comprehension Verbalized understanding          SLP Short Term Goals - 04/08/15 1007    SLP SHORT TERM GOAL #6   Title pt will provide functional multimodal responses in simple conversation with occasional min A   Baseline for 4 weeks or 7 visits, for all STGs   Time 4   Period Weeks   Status New   SLP SHORT TERM GOAL #7   Title pt will demo understanding of mod complex conversation with repeats allowed 90%   Time 4   Period Weeks   Status On-going   SLP SHORT TERM GOAL #8   Title pt will write one word responses to communicate functionally, with multiple attempts allowed, achieving 75% listener comprehension   Time 4   Period Weeks   Status On-going          SLP Long Term Goals - 04/08/15 1008    SLP LONG TERM GOAL #1   Title pt will demo undertanding of simple/mod complex conversational questions via yes/no responses 80% accuracy   Period --  or 16 visits - for all LTGs   Status Achieved   SLP LONG TERM GOAL #2   Title pt will demo undertanding of simple conversational quesitons via yes/no responses 90% accuracy   Status Achieved   SLP LONG TERM GOAL #3   Title pt will perform rote speech tasks  simultaneously with SLP with 40% accuracy (functional responses allowed)   Status Not Met   SLP LONG TERM GOAL #4   Title pt will imitate salient words/family names with 20% success   Status Achieved   SLP LONG TERM GOAL #5   Title pt will demo use of simple alternative/augmentative communcation and/or multimodal communication with 80% success in mod complex message conveyance   Baseline 8 weeks or 15 more visits   Time 8   Status Revised   SLP LONG TERM GOAL #6   Title pt will state family names/information and other salient personal 2-3 word information with 80% success   Time 8   Period Weeks   Status On-going          Plan - 04/08/15 1134    Clinical Impression Statement Pt cont to use  multimodal communication in simple  conversation, 65% success due to vague gesturing. Pt benefitted from SLP mod cues. Writing of 3 letter words improved. Significant speech and language deficits cont, for skilled ST to cont to work with maximizing pt's communication abilities.   Speech Therapy Frequency 2x / week   Duration --  8 weeks or 15 visits   Treatment/Interventions SLP instruction and feedback;Compensatory strategies;Internal/external aids;Environmental controls;Patient/family education;Functional tasks;Cueing hierarchy;Multimodal communcation approach   Potential to Achieve Goals Fair   Potential Considerations Severity of impairments        Problem List Patient Active Problem List   Diagnosis Date Noted  . 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 Left middle cerebral artery stroke ([place]) 10/21/2014  . Right hemiparesis ([place]) 10/21/2014  . Aphasia due to stroke 10/21/2014    [facility] , MS, CCC-SLP  04/08/2015, 11:40 AM  [facility] [address] [place], [place], [address] Phone: [phone]   Fax:  [phone]   Name: Mario [name] MRN: [id number] Date of Birth: [birth year]

## 2015-04-12 ENCOUNTER — Telehealth: Payer: Self-pay | Admitting: Physical Medicine & Rehabilitation

## 2015-04-13 ENCOUNTER — Ambulatory Visit: Payer: BC Managed Care – PPO

## 2015-04-13 ENCOUNTER — Encounter: Payer: Self-pay | Admitting: Physical Medicine & Rehabilitation

## 2015-04-13 ENCOUNTER — Ambulatory Visit: Payer: BC Managed Care – PPO | Admitting: Rehabilitation

## 2015-04-13 ENCOUNTER — Encounter: Payer: Self-pay | Admitting: Rehabilitation

## 2015-04-13 ENCOUNTER — Encounter: Payer: BC Managed Care – PPO | Attending: Physical Medicine & Rehabilitation

## 2015-04-13 ENCOUNTER — Ambulatory Visit (HOSPITAL_BASED_OUTPATIENT_CLINIC_OR_DEPARTMENT_OTHER): Payer: BC Managed Care – PPO | Admitting: Physical Medicine & Rehabilitation

## 2015-04-13 VITALS — BP 140/92 | HR 68 | Resp 14

## 2015-04-13 DIAGNOSIS — I69320 Aphasia following cerebral infarction: Secondary | ICD-10-CM | POA: Diagnosis not present

## 2015-04-13 DIAGNOSIS — G8111 Spastic hemiplegia affecting right dominant side: Secondary | ICD-10-CM

## 2015-04-13 DIAGNOSIS — Z8673 Personal history of transient ischemic attack (TIA), and cerebral infarction without residual deficits: Secondary | ICD-10-CM | POA: Insufficient documentation

## 2015-04-13 DIAGNOSIS — R269 Unspecified abnormalities of gait and mobility: Secondary | ICD-10-CM

## 2015-04-13 DIAGNOSIS — R482 Apraxia: Secondary | ICD-10-CM

## 2015-04-13 DIAGNOSIS — R2681 Unsteadiness on feet: Secondary | ICD-10-CM

## 2015-04-13 DIAGNOSIS — IMO0002 Reserved for concepts with insufficient information to code with codable children: Secondary | ICD-10-CM

## 2015-04-13 DIAGNOSIS — I10 Essential (primary) hypertension: Secondary | ICD-10-CM | POA: Insufficient documentation

## 2015-04-13 DIAGNOSIS — R4701 Aphasia: Secondary | ICD-10-CM

## 2015-04-13 NOTE — Therapy (Signed)
[facility] [address] [place], [place], [address] Phone: [phone]   Fax:  [phone]  Physical Therapy Treatment  Patient Details  Name: Mario [name] MRN: [id number] Date of Birth: April 13, 1976 No Data Recorded  Encounter Date: 04/13/2015      PT End of Session - 04/13/15 2025    Visit Number 16   Number of Visits 17   Date for PT Re-Evaluation 04/16/15   PT Start Time 0847   PT Stop Time 0935   PT Time Calculation (min) 48 min   Activity Tolerance Patient tolerated treatment well   Behavior During Therapy [facility] for tasks assessed/performed      Past Medical History  Diagnosis Date  . Hypertension     Past Surgical History  Procedure Laterality Date  . No past surgeries      There were no vitals filed for this visit.  Visit Diagnosis:  Abnormality of gait  Unsteadiness  Weakness due to cerebrovascular accident  Spastic hemiplegia affecting right dominant side ([place])      Subjective Assessment - 04/13/15 0849    Subjective Denies pain and any changes since last visit.  Note has MD appointment with Dr. Letta Pate today for botox assessment.     Patient is accompained by: Family member   Limitations Walking;House hold activities;Reading;Standing;Writing   Patient Stated Goals Improve speech, be able to walk better, and get functional use of RUE.    Currently in Pain? No/denies              NMR: Session addressed isolated and sustained contraction of R glute med.  Addressed during R tall kneeling task with LLE on unstable surface while performing hangman task on mirror.  Utilized Geologist, engineering for visual feedback for upright posture and increased weight shift onto RLE.  Provided facilitation for this at pelvis as well as at trunk to increase sustained activation.  Progressed to gait with R reaction AFO (and supination strap) x 115' with very slow pace in order to increase B stance time, esp on RLE  to increase activation in R glute med muscle.  Provided tactile cues at R knee to prevent hyperextension, at R pelvis for increased forward/lateral weight shift with verbal cues for slower pace.  Utilized Geologist, engineering for part of gait x 50' with cues for posture as pt still tends to keep L shoulder "hiked" during gait, esp without mirror.  Ended session with RUE support on // bars while stepping LLE to balance disc then up to 6" step, then to ground, again in order to increase isolated glute med activation.  Note that pt tends to over use R quad muscles and noted these to be very fatigued at end of session.  Will continue to address this in future sessions for more normalized gait pattern and postural control.                     PT Education - 04/13/15 [phone]    Education provided Yes   Education Details Education on importance of carryover from therapy sessions (slower gait, thinking about steps, utilization of RUE)   Person(s) Educated Patient   Methods Explanation   Comprehension Verbalized understanding          PT Short Term Goals - 03/04/15 1035    PT SHORT TERM GOAL #1   Title Pt will improve BERG balance score to 56/56 to decrease fall risk and improve functional mobility. (Target 03/03/15)   Baseline 55/56  on 03/04/15   Status Not Met   PT SHORT TERM GOAL #2   Title Pt will improve gait speed to 2.83 ft/sec to improve functional ambulation level to community ambulator.   (Target 03/03/15)   Baseline 2.00 ft/sec on 03/04/15, feel that this is due to increased tone.    Status Not Met   PT SHORT TERM GOAL #3   Title Pt will ambulate >500' on indoor and outdoor surfaces including grass, inclines and curb step without AD with AFO at S level without overt LOB to indicate safe negotiation of indoor and outdoor surfaces.  (Target 03/03/15)   Baseline met on 03/03/15, however recommend use of cane at home for increased safety and to decrease tone with gait.     Status Achieved   PT SHORT  TERM GOAL #4   Title Pt will negotiate up/down 4 steps in step to pattern with single rail at mod I level with safe technique to traverse community obstacles.   (Target 03/03/15)   Baseline Met on 9/22.   Status Achieved   PT SHORT TERM GOAL #5   Title Pt will improve 3MWT to 498' to improve ambulation function and functional endurance.    Baseline 366' on 03/03/15   Status Not Met           PT Long Term Goals - 03/18/15 1206    PT LONG TERM GOAL #1   Title Pts SIS mobility score will improve to 80% to indicate improved perceived functional mobility.  (Target 04/16/15)  Target dates updated due to missing 2 wks of PT   Baseline 66.7% on 8/31   Time 8   Period Weeks   PT LONG TERM GOAL #2   Title Pt will improve ankle DF strength to 3/5 in order to increase safety with household ambulation and begin to address ambulation without use of AFO.  (Target 04/16/15)   Baseline 1/5 on 8/31   Time 8   Period Weeks   PT LONG TERM GOAL #3   Title Pt will verbalize ability to return to leisure activities to indicate return to community function.   (Target 04/16/15)   Time 8   Period Weeks   PT LONG TERM GOAL #4   Title Pt will negotiate up/down 4 steps without rail in alternating fashion to traverse community stairs while carrying object.  (Target 04/16/15)   Time 8   Period Weeks   PT LONG TERM GOAL #5   Title Pt will demonstrate ability to ambulate up to 150' with decreased R genu recurvatum to improve R knee stability/strength and prevent knee injury.  (Target Date: 04/16/15)   Baseline 8   Period Weeks   PT LONG TERM GOAL #6   Title Pt will demonstrate ability to perform SLS x 5 secs on RLE to improve balance and awareness of RLE. (Target Date: 04/16/15)   Time 8   Period Weeks               Plan - 04/13/15 0947    Clinical Impression Statement Skilled session focused on isolated and sustained R glute med activation in order to carry over to improved gait pattern and  enhanced mobility.  Pt able to activate during session however note over activation of R quads with heavy facilitation and cuing needed for isolated glute activation.  Education for slower gait speed and more purposeful steps at home in order to carryover what is addressed in therapy.    Pt will benefit from  skilled therapeutic intervention in order to improve on the following deficits Abnormal gait;Decreased balance;Decreased coordination;Decreased knowledge of precautions;Decreased safety awareness;Decreased strength;Difficulty walking;Impaired perceived functional ability;Impaired sensation;Impaired flexibility;Impaired tone;Impaired UE functional use;Postural dysfunction;Decreased knowledge of use of DME   Rehab Potential Excellent   Clinical Impairments Affecting Rehab Potential adhering to safety precautions   PT Frequency 2x / week   PT Duration 8 weeks   PT Treatment/Interventions ADLs/Self Care Home Management;Electrical Stimulation;Biofeedback;DME Instruction;Gait training;Stair training;Functional mobility training;Therapeutic exercise;Balance training;Neuromuscular re-education;Patient/family education;Manual techniques;Visual/perceptual remediation/compensation   PT Next Visit Plan Discuss re-cert with pt for another 4 weeks (check LTG's as able).  Gait training with new AFO if present.  R knee stability (without AFO), stretching heel cord (ensuring ankle in neutral, eversion stretch, isolated hip/knee flex with arms in shoulder flexed/elbows extended position to decrease retraction on R side, RLE WB.   Consulted and Agree with Plan of Care Patient        Problem List Patient Active Problem List   Diagnosis Date Noted  . 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 Left middle cerebral artery stroke ([place]) 10/21/2014  . Right hemiparesis ([place]) 10/21/2014  . Aphasia due to stroke 10/21/2014    Cameron Sprang, PT, MPT [facility] [address] [place], [place], [address] Phone: [phone]   Fax:  [phone] 04/13/2015, 9:57 AM  Name: Mario [name] MRN: [id number] Date of Birth: July 15, 1975

## 2015-04-13 NOTE — Patient Instructions (Addendum)
   Practice 40 correct "p" sounds and 40 correct "k" sounds per day  -  think to  **~~whisper~~**  If 40 is too many to start with, start with 20.

## 2015-04-13 NOTE — Telephone Encounter (Signed)
Talked to pt's PCP, they report that the patient does not have spasticity, wondering why he is coming for Botox evaluation.

## 2015-04-13 NOTE — Patient Instructions (Signed)
Botox injection right forearm right biceps and right leg next visit

## 2015-04-13 NOTE — Progress Notes (Signed)
Subjective:    Patient ID: Mario [name], male    DOB: July 19, 1975, 39 y.o.   MRN: [id number]  39 year old right-handed male with history of hypertension, independent prior to admission, living with his wife, works as a Airline pilot.  He presented to [facility] on Oct 19, 2014, with acute onset of right-sided weakness, headache, and inability to speak.  Cranial CT scan negative.  MRI of the head showed moderate volume of patchy acute infarct, left MCA territory. Most confluent involvement at the corona radiata.  No mass effect. Carotid Dopplers with no ICA stenosis.  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  Collateral supply of left MCA branch vessels decreased in number and caliber in the area of acute infarction.  Echocardiogram with ejection fraction of 65% without emboli.  The patient did not receive t- PA.  Placed on aspirin for CVA prophylaxis.  Subcutaneous Lovenox for DVT prophylaxis.  Pureed diet with honey thick liquids  Ambulating 250 feet with a left wide base quad cane minimal assist with a right rocker brace.  He ambulated with varied step to step through gait pattern.  Verbal and tactile cues needed.  Negotiated 16 steps with a left hand rail and contact guard assist.  Activities of daily living focused on patient attempting to stabilize right upper extremity while reaching forward with left upper extremity.  Continued to need some max assistance to stabilize the right elbow.  He utilized exercise program for digit flexion and extension.  Slow but progressive gains while on the rehab unit.  The patient and family encouraged with overall progress.  Speech Therapy followup for noted global aphasia. The patient can write some simple words with very inconsistent. Emphasis made on asking yes, no questions with simple topics.    HPI  Patient follows up today after conferring with his OT about spasticity management. Concerns about right biceps spasticity as well as  finger and wrist flexor spasticity. He is ambulating without an assistive device lives at home mainly by himself but his father drops in on him. The patient does not drive. He attends outpatient therapy twice a week PT OT and speech. He remains severely aphasic. His receptive language skills are good however No recent falls reported.  Pain Inventory Average Pain 0 Pain Right Now 0 My pain is tingling  In the last 24 hours, has pain interfered with the following? General activity 4 Relation with others 0 Enjoyment of life 0 What TIME of day is your pain at its worst? no pain Sleep (in general) Fair  Pain is worse with: walking and unsure Pain improves with: no pain Relief from Meds: no pain  Mobility walk without assistance how many minutes can you walk? 15 ability to climb steps?  yes  Function employed # of hrs/week . 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 No pertinent family history. Social History   Social History  . Marital Status: Married    Spouse Name: N/A  . Number of Children: N/A  . Years of Education: N/A   Social History Main Topics  . Smoking status: Never Smoker   . Smokeless tobacco: None  . Alcohol Use: No  . Drug Use: No  . Sexual Activity: Not Asked   Other Topics Concern  . None   Social History Narrative   Past Surgical  History  Procedure Laterality Date  . No past surgeries     Past Medical History  Diagnosis Date  . Hypertension    BP 140/92 mmHg  Pulse 68  Resp 14  SpO2 99%  Opioid Risk Score:   Fall Risk Score:  `1  Depression screen PHQ 2/9  Depression screen PHQ 2/9 04/13/2015  Decreased Interest 0  Down, Depressed, Hopeless 1  PHQ - 2 Score 1  Altered sleeping 0  Tired, decreased energy 0  Change in appetite 1  Feeling bad  or failure about yourself  1  Trouble concentrating 1  Moving slowly or fidgety/restless 1  Suicidal thoughts 0  PHQ-9 Score 5     Review of Systems  Gastrointestinal: Positive for constipation.  Neurological: Positive for weakness and numbness.       Tingling  Psychiatric/Behavioral: Positive for dysphoric mood. The patient is nervous/anxious.   All other systems reviewed and are negative.      Objective:   Physical Exam  Constitutional: He appears well-developed and well-nourished.  HENT:  Head: Normocephalic and atraumatic.  Eyes: Conjunctivae and EOM are normal. Pupils are equal, round, and reactive to light.  Neck: Normal range of motion.  Neurological: He is alert. Gait abnormal.  Patient follows commands. He laughs appropriately at jokes. Expressively very limited output but is able to nod his head yes or no  Motor strength is  3 minus at the right biceps 2 minus at the triceps 3 minus at the finger flexors 2 minus at the wrist extensors and finger extensors 3 minus at the deltoid. Right lower extremity 4 minus at the hip flexor and knee extensor and 0at the ankle dorsiflexor And plantar flexor  Ambulates without assisted device uses a right AFO  Psychiatric: He has a normal mood and affect.  Nursing note and vitals reviewed.   Right foot inversion with standing. This is with his brace off. No toe curling     Assessment & Plan:  1. 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

## 2015-04-14 ENCOUNTER — Ambulatory Visit: Payer: BC Managed Care – PPO

## 2015-04-14 ENCOUNTER — Ambulatory Visit: Payer: BC Managed Care – PPO | Admitting: Physical Therapy

## 2015-04-14 DIAGNOSIS — G8111 Spastic hemiplegia affecting right dominant side: Secondary | ICD-10-CM

## 2015-04-14 DIAGNOSIS — R269 Unspecified abnormalities of gait and mobility: Secondary | ICD-10-CM

## 2015-04-14 DIAGNOSIS — R482 Apraxia: Secondary | ICD-10-CM

## 2015-04-14 DIAGNOSIS — R4701 Aphasia: Secondary | ICD-10-CM

## 2015-04-14 NOTE — Therapy (Signed)
[facility] [address] [place], [place], [address] Phone: [phone]   Fax:  [phone]  Speech Language Pathology Treatment  Patient Details  Name: Mario [name] MRN: [id number] Date of Birth: 23-Jan-1976 No Data Recorded  Encounter Date: 04/14/2015      End of Session - 04/14/15 1054    Visit Number 20   Number of Visits 33   Date for SLP Re-Evaluation 06/04/15   SLP Start Time 66   SLP Stop Time  0388   SLP Time Calculation (min) 42 min      Past Medical History  Diagnosis Date  . Hypertension     Past Surgical History  Procedure Laterality Date  . No past surgeries      There were no vitals filed for this visit.  Visit Diagnosis: Expressive aphasia  Receptive aphasia  Verbal apraxia      Subjective Assessment - 04/14/15 0937    Subjective "Hey." (greeting)               ADULT SLP TREATMENT - 04/14/15 0937    General Information   Behavior/Cognition Alert;Cooperative;Pleasant mood   Treatment Provided   Treatment provided Cognitive-Linquistic   Pain Assessment   Pain Assessment No/denies pain   Cognitive-Linquistic Treatment   Treatment focused on Aphasia;Apraxia   Skilled Treatment SLP targeted expressive language today with written and verbal language by having pt write salient and functional words (family, needs/wants) as well as verbalize those words. Pt able to verbalize famliy names and good friends names 95% success. Given scenarios to communicate with those people, pt req'd SLP mod-max A for multimodal communication other than simple gestures (drawing SIMPLE pictures - ie. a football on a TV to communicate desire to watch football with a friend).    Assessment / Recommendations / Plan   Plan Continue with current plan of care   Progression Toward Goals   Progression toward goals Progressing toward goals          SLP Education - 04/14/15 1053    Education Details  [employer]) Educated Patient   Methods Explanation;Other (comment)  repeating in different and simple language   Comprehension Verbalized understanding          SLP Short Term Goals - 04/08/15 1007    SLP SHORT TERM GOAL #6   Title pt will provide functional multimodal responses in simple conversation with occasional min A   Baseline for 4 weeks or 7 visits, for all STGs   Time 4   Period Weeks   Status New   SLP SHORT TERM GOAL #7   Title pt will demo understanding of mod complex conversation with repeats allowed 90%   Time 4   Period Weeks   Status On-going   SLP SHORT TERM GOAL #8   Title pt will write one word responses to communicate functionally, with multiple attempts allowed, achieving 75% listener comprehension   Time 4   Period Weeks   Status On-going          SLP Long Term Goals - 04/08/15 1008    SLP LONG TERM GOAL #1   Title pt will demo undertanding of simple/mod complex conversational questions via yes/no responses 80% accuracy   Period --  or 16 visits - for all LTGs   Status Achieved   SLP LONG TERM GOAL #2   Title pt will demo undertanding of simple conversational quesitons via yes/no responses 90%  accuracy   Status Achieved   SLP LONG TERM GOAL #3   Title pt will perform rote speech tasks simultaneously with SLP with 40% accuracy (functional responses allowed)   Status Not Met   SLP LONG TERM GOAL #4   Title pt will imitate salient words/family names with 20% success   Status Achieved   SLP LONG TERM GOAL #5   Title pt will demo use of simple alternative/augmentative communcation and/or multimodal communication with 80% success in mod complex message conveyance   Baseline 8 weeks or 15 more visits   Time 8   Status Revised   SLP LONG TERM GOAL #6   Title pt will state family names/information and other salient personal 2-3 word information with 80% success   Time 8   Period Weeks   Status On-going           Plan - 04/14/15 1054    Clinical Impression Statement Pt req'd cues in the session today to use multimodal communication. Skilled ST needed to cont to work with pt's expressive and receptive language and apraxia. Multimodal communication needs to be encouraged.   Speech Therapy Frequency 2x / week   Duration --  7 weeks or 13 visits   Treatment/Interventions SLP instruction and feedback;Compensatory strategies;Internal/external aids;Environmental controls;Patient/family education;Functional tasks;Cueing hierarchy;Multimodal communcation approach   Potential to Achieve Goals Fair   Potential Considerations Severity of impairments        Problem List Patient Active Problem List   Diagnosis Date Noted  . Spastic hemiplegia affecting right dominant side ([place]) 04/13/2015  . 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 Left middle cerebral artery stroke ([place]) 10/21/2014  . Right hemiparesis ([place]) 10/21/2014  . Aphasia due to stroke 10/21/2014    [facility] , MS, CCC-SLP  04/14/2015, 10:58 AM  [facility] [address] [place], [place], [address] Phone: [phone]   Fax:  [phone]   Name: Mario [name] MRN: [id number] Date of Birth: Feb 03, 1976

## 2015-04-14 NOTE — Therapy (Signed)
[facility] [address] [place], [place], [address] Phone: [phone]   Fax:  [phone]  Speech Language Pathology Treatment  Patient Details  Name: Mario [name] MRN: [id number] Date of Birth: 1976/06/04 No Data Recorded  Encounter Date: 04/13/2015      End of Session - 04/13/15 0844    Visit Number 19   Number of Visits 33   Date for SLP Re-Evaluation 06/04/15   SLP Start Time 0804   SLP Stop Time  0845   SLP Time Calculation (min) 41 min      Past Medical History  Diagnosis Date  . Hypertension     Past Surgical History  Procedure Laterality Date  . No past surgeries      There were no vitals filed for this visit.  Visit Diagnosis: No diagnosis found.      Subjective Assessment - 04/13/15 0807    Subjective (greeting) "Yes."               ADULT SLP TREATMENT - 04/13/15 0808    General Information   Behavior/Cognition Alert;Cooperative;Pleasant mood   Treatment Provided   Treatment provided Cognitive-Linquistic   Pain Assessment   Pain Assessment No/denies pain   Cognitive-Linquistic Treatment   Treatment focused on Apraxia;Aphasia   Skilled Treatment SLP facilitated pt's improved speech and language by drilling consoant vowel words with him. /m/ initial and /b/ initial 35-40% successful indpendently. Voiceless initial consonants <20% success with visual and verbal cues, written word, and simultaneous SLP production. Voiceless consonants req'd SLP training of pt with "whisper the sound" at the phoneme level with 30% success. When SLP had pt imitate initial voiceless words after this practice, voiced consonant (/b/, /g/) persisted. SLP attempted practice with /f/ phoneme, which was more successful at remaining voiceless, however when paired with /a/, pt no longer drove air for the /f/ phoneme and just used lip position and /ai/ repeatedly.     Assessment / Recommendations / Plan   Plan  Continue with current plan of care   Progression Toward Goals   Progression toward goals --  pt cont to work on goals            SLP Short Term Goals - 04/08/15 1007    SLP SHORT TERM GOAL #6   Title pt will provide functional multimodal responses in simple conversation with occasional min A   Baseline for 4 weeks or 7 visits, for all STGs   Time 4   Period Weeks   Status New   SLP SHORT TERM GOAL #7   Title pt will demo understanding of mod complex conversation with repeats allowed 90%   Time 4   Period Weeks   Status On-going   SLP SHORT TERM GOAL #8   Title pt will write one word responses to communicate functionally, with multiple attempts allowed, achieving 75% listener comprehension   Time 4   Period Weeks   Status On-going          SLP Long Term Goals - 04/08/15 1008    SLP LONG TERM GOAL #1   Title pt will demo undertanding of simple/mod complex conversational questions via yes/no responses 80% accuracy   Period --  or 16 visits - for all LTGs   Status Achieved   SLP LONG TERM GOAL #2   Title pt will demo undertanding of simple conversational quesitons via yes/no responses 90% accuracy   Status Achieved   SLP LONG TERM GOAL #3  Title pt will perform rote speech tasks simultaneously with SLP with 40% accuracy (functional responses allowed)   Status Not Met   SLP LONG TERM GOAL #4   Title pt will imitate salient words/family names with 20% success   Status Achieved   SLP LONG TERM GOAL #5   Title pt will demo use of simple alternative/augmentative communcation and/or multimodal communication with 80% success in mod complex message conveyance   Baseline 8 weeks or 15 more visits   Time 8   Status Revised   SLP LONG TERM GOAL #6   Title pt will state family names/information and other salient personal 2-3 word information with 80% success   Time 8   Period Weeks   Status On-going          Plan - 04/13/15 0848    Clinical Impression Statement Pt  cont to use  multimodal communication in simple conversation at home including verbal and gesturing and printing. Significant speech and language deficits cont, for skilled ST to cont to work with maximizing pt's communication abilities.   Speech Therapy Frequency 2x / week   Duration --  7 weeks or 14 visits   Treatment/Interventions SLP instruction and feedback;Compensatory strategies;Internal/external aids;Environmental controls;Patient/family education;Functional tasks;Cueing hierarchy;Multimodal communcation approach        Problem List Patient Active Problem List   Diagnosis Date Noted  . Spastic hemiplegia affecting right dominant side ([place]) 04/13/2015  . 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 Right hemiparesis ([place]) 10/21/2014  . Aphasia due to stroke 10/21/2014    [facility] , MS, CCC-SLP  04/14/2015, 9:03 AM  [facility] [address] [place], [place], [address] Phone: [phone]   Fax:  [phone]   Name: Mario [name] MRN: [id number] Date of Birth: [birth year]

## 2015-04-14 NOTE — Patient Instructions (Signed)
  When we are finished with therapy we will likely encourage you to call (or be referred to) the [facility]. They can assist you with a device to help you communicate.

## 2015-04-14 NOTE — Therapy (Signed)
[facility] [address] [place], [place], [address] Phone: [phone]   Fax:  [phone]  Physical Therapy Treatment  Patient Details  Name: Mario [name] MRN: [id number] Date of Birth: July 24, 1975 No Data Recorded  Encounter Date: 04/14/2015      PT End of Session - 04/14/15 2107    Visit Number 17   Number of Visits 17   Date for PT Re-Evaluation 04/16/15   PT Start Time 0847   PT Stop Time 0930   PT Time Calculation (min) 43 min   Equipment Utilized During Treatment Gait belt   Activity Tolerance Patient tolerated treatment well   Behavior During Therapy [facility] for tasks assessed/performed      Past Medical History  Diagnosis Date  . Hypertension     Past Surgical History  Procedure Laterality Date  . No past surgeries      There were no vitals filed for this visit.  Visit Diagnosis:  Abnormality of gait  Spastic hemiplegia affecting right dominant side ([place])      Subjective Assessment - 04/14/15 0850    Subjective Denies pain and any changes since last visit. Per MD note, Dr. Letta Pate recommending botox injections in [place].   Limitations Walking;House hold activities;Reading;Standing;Writing   Patient Stated Goals Improve speech, be able to walk better, and get functional use of RUE.    Currently in Pain? No/denies                         [facility] Adult PT Treatment/Exercise - 04/14/15 0001    Transfers   Transfers Sit to Stand;Stand to Sit;Stand Pivot Transfers   Sit to Stand 6: Modified independent (Device/Increase time)   Stand to Sit 6: Modified independent (Device/Increase time)   Stand Pivot Transfers 6: Modified independent (Device/Increase time)   Comments Pt somewhat impulsive with mobility.    Ambulation/Gait   Ambulation/Gait Yes   Ambulation/Gait Assistance 5: Supervision;6: Modified independent (Device/Increase time)   Ambulation/Gait Assistance Details 350'  over level, indoor surfaces and unlevel, paved surfaces without AD using R Reaction AFO and supination control strap. Tactile cueing at distal R hamstrings provided during R mid-terminal stance to promote control of R genu recurvatum (effective for < 50% of gait trial). Verbal/demo cueing for decreased RLE step length grossly ineffective in controlling GR. Therefore, remainder of gait training occurred on treadmill (BUE support) at slow velocity (0.3 -0.5) with use of mirror on R side for visual input of R knee position. Also added Ace bandage at R knee for increased proprioceptive input. Green Tband utilized (anchored at R forefoot at gait belt at waist) to promote R hip/tibial external rotation.   Ambulation Distance (Feet) 750 Feet  total   Assistive device None  R Reaction AFO with supination control strap   Gait Pattern Decreased arm swing - right;Step-through pattern;Decreased stance time - right;Decreased dorsiflexion - right;Decreased weight shift to right;Right genu recurvatum;Poor foot clearance - right;Right foot flat   Gait velocity --   Stairs Yes   Stairs Assistance 6: Modified independent (Device/Increase time)   Stair Management Technique One rail Left;Alternating pattern;Forwards   Number of Stairs 8   Height of Stairs 6   Door Management --   Curb --   Gait Comments Following gait training on treadmill, pt able to consistently control R terminal knee extension during gait x75' with decreased gait velocity while wearing R Reaction AFO and supination control strap.   Standardized  Balance Assessment   Standardized Balance Assessment Berg Balance Test   Berg Balance Test   Sit to Stand Able to stand without using hands and stabilize independently   Standing Unsupported Able to stand safely 2 minutes   Sitting with Back Unsupported but Feet Supported on Floor or Stool Able to sit safely and securely 2 minutes   Stand to Sit Sits safely with minimal use of hands   Transfers Able to  transfer safely, minor use of hands   Standing Unsupported with Eyes Closed Able to stand 10 seconds safely   Standing Ubsupported with Feet Together Able to place feet together independently and stand 1 minute safely   From Standing, Reach Forward with Outstretched Arm Can reach confidently >25 cm (10")   From Standing Position, Pick up Object from Floor Able to pick up shoe safely and easily   From Standing Position, Turn to Look Behind Over each Shoulder Looks behind from both sides and weight shifts well   Turn 360 Degrees Able to turn 360 degrees safely in 4 seconds or less   Standing Unsupported, Alternately Place Feet on Step/Stool Able to stand independently and complete 8 steps >20 seconds  23 secs   Standing Unsupported, One Foot in Front Able to place foot tandem independently and hold 30 seconds   Standing on One Leg Able to lift leg independently and hold > 10 seconds   Total Score 55   Self-Care   Self-Care Other Self-Care Comments   Other Self-Care Comments  Provided addition of 4way hip exercises, see exercise and pt instruction for details.    Neuro Re-ed    Neuro Re-ed Details  With NMES on RLE as described above, pt performed  squats x12, x9 with greeen Tband at R knee for proprioceptive input, demonstration cues for decreased quadriceps dominance, and tactile cueing for increased R hamstring activation/control. With Tband still at R knee (anchored to mat table behind pt), pt performed LLE tapping on targets of varying heights to increase R knee control. Pt required multimodal cueing to prevent R genu recurvatum. At end of session, pt able to consistently perform LLE tapping onto 4" step without AFO or NMES with no L geu recurvatum.   Exercises   Exercises Other Exercises   Other Exercises  heel cord stretch standing with UE support x 60 secs.  Provided education to perform stretch in this manner at home with father to ensure foot placement.    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   Person(s) Educated Patient   Methods Explanation;Demonstration;Verbal cues   Comprehension Verbalized understanding;Returned demonstration;Need further instruction          PT Short Term Goals - 03/04/15 1035    PT SHORT TERM GOAL #1   Title Pt will improve BERG balance score to 56/56 to decrease fall risk and improve functional mobility. (Target 03/03/15)   Baseline 55/56 on 03/04/15   Status Not Met   PT SHORT TERM GOAL #2   Title Pt will improve gait speed to 2.83 ft/sec to improve functional ambulation level to community ambulator.   (Target 03/03/15)   Baseline 2.00 ft/sec on 03/04/15, feel that this  is due to increased tone.    Status Not Met   PT SHORT TERM GOAL #3   Title Pt will ambulate >500' on indoor and outdoor surfaces including grass, inclines and curb step without AD with AFO at S level without overt LOB to indicate safe negotiation of indoor and outdoor surfaces.  (Target 03/03/15)   Baseline met on 03/03/15, however recommend use of cane at home for increased safety and to decrease tone with gait.     Status Achieved   PT SHORT TERM GOAL #4   Title Pt will negotiate up/down 4 steps in step to pattern with single rail at mod I level with safe technique to traverse community obstacles.   (Target 03/03/15)   Baseline Met on 9/22.   Status Achieved   PT SHORT TERM GOAL #5   Title Pt will improve 3MWT to 498' to improve ambulation function and functional endurance.    Baseline 366' on 03/03/15   Status Not Met           PT Long Term Goals - 04/14/15 2108    PT LONG TERM GOAL #1   Title Pts SIS mobility score will improve to 80% to indicate improved perceived  functional mobility.  (Target 04/16/15)  Target dates updated due to missing 2 wks of PT   Baseline 66.7% on 8/31   Time 8   Period Weeks   PT LONG TERM GOAL #2   Title Pt will improve ankle DF strength to 3/5 in order to increase safety with household ambulation and begin to address ambulation without use of AFO.  (Target 04/16/15)   Baseline 1/5 on 8/31   Time 8   Period Weeks   PT LONG TERM GOAL #3   Title Pt will verbalize ability to return to leisure activities to indicate return to community function.   (Target 04/16/15)   Time 8   Period Weeks   PT LONG TERM GOAL #4   Title Pt will negotiate up/down 4 steps without rail in alternating fashion to traverse community stairs while carrying object.  (Target 04/16/15)   Baseline Met 11/9.   Time 8   Period Weeks   Status Achieved   PT LONG TERM GOAL #5   Title Pt will demonstrate ability to ambulate up to 150' with decreased R genu recurvatum to improve R knee stability/strength and prevent knee injury.  (Target Date: 04/16/15)   Time 8   Period Weeks   PT LONG TERM GOAL #6   Title Pt will demonstrate ability to perform SLS x 5 secs on RLE to improve balance and awareness of RLE. (Target Date: 04/16/15)   Time 8   Period Weeks               Plan - 04/14/15 2109    Clinical Impression Statement Session focused on R knee control during gait. Pt appeared to benefit most from visual feedback using mirror, as pt was able to most consistently control R genu recurvatum during gait on treadmill when using mirror. Pt met LTG for stair negotiation with mod I using reciprocal pattern.   Pt will benefit from skilled therapeutic intervention in order to improve on the following deficits Abnormal gait;Decreased balance;Decreased coordination;Decreased knowledge of precautions;Decreased safety awareness;Decreased strength;Difficulty walking;Impaired perceived functional ability;Impaired sensation;Impaired flexibility;Impaired tone;Impaired  UE functional use;Postural dysfunction;Decreased knowledge of use of DME   Rehab Potential Excellent   Clinical Impairments Affecting Rehab Potential adhering to safety precautions   PT Frequency 2x /  week   PT Duration 8 weeks   PT Treatment/Interventions ADLs/Self Care Home Management;Electrical Stimulation;Biofeedback;DME Instruction;Gait training;Stair training;Functional mobility training;Therapeutic exercise;Balance training;Neuromuscular re-education;Patient/family education;Manual techniques;Visual/perceptual remediation/compensation   PT Next Visit Plan Check LTG's and recert. Discuss POC with pt/father.  Gait training with new AFO if present.  R knee stability (without AFO), stretching heel cord (ensuring ankle in neutral, eversion stretch, isolated hip/knee flex with arms in shoulder flexed/elbows extended position to decrease retraction on R side, RLE WB.   Consulted and Agree with Plan of Care Patient        Problem List Patient Active Problem List   Diagnosis Date Noted  . Spastic hemiplegia affecting right dominant side ([place]) 04/13/2015  . 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 Left middle cerebral artery stroke ([place]) 10/21/2014  . Right hemiparesis ([place]) 10/21/2014  . Aphasia due to stroke 10/21/2014   Billie Ruddy, PT, DPT [facility] [address] [place], [place], [address] Phone: [phone]   Fax:  [phone] 04/14/2015, 9:13 PM   Name: Mario [name] MRN: [id number] Date of Birth: [birth year]

## 2015-04-15 NOTE — Addendum Note (Signed)
Addended by: Harriet ButtePARCELL, Adryen Cookson A on: 04/15/2015 05:45 PM   Modules accepted: Orders

## 2015-04-16 ENCOUNTER — Inpatient Hospital Stay: Payer: BC Managed Care – PPO | Admitting: Physical Medicine & Rehabilitation

## 2015-04-20 ENCOUNTER — Ambulatory Visit: Payer: BC Managed Care – PPO | Admitting: Rehabilitation

## 2015-04-20 ENCOUNTER — Encounter: Payer: Self-pay | Admitting: Rehabilitation

## 2015-04-20 ENCOUNTER — Ambulatory Visit: Payer: BC Managed Care – PPO | Admitting: Speech Pathology

## 2015-04-20 ENCOUNTER — Ambulatory Visit: Payer: BC Managed Care – PPO | Admitting: Occupational Therapy

## 2015-04-20 ENCOUNTER — Encounter: Payer: Self-pay | Admitting: Occupational Therapy

## 2015-04-20 DIAGNOSIS — G8111 Spastic hemiplegia affecting right dominant side: Secondary | ICD-10-CM

## 2015-04-20 DIAGNOSIS — R269 Unspecified abnormalities of gait and mobility: Secondary | ICD-10-CM

## 2015-04-20 DIAGNOSIS — R4701 Aphasia: Secondary | ICD-10-CM

## 2015-04-20 DIAGNOSIS — R2681 Unsteadiness on feet: Secondary | ICD-10-CM

## 2015-04-20 DIAGNOSIS — R482 Apraxia: Secondary | ICD-10-CM

## 2015-04-20 DIAGNOSIS — IMO0002 Reserved for concepts with insufficient information to code with codable children: Secondary | ICD-10-CM

## 2015-04-20 DIAGNOSIS — R201 Hypoesthesia of skin: Secondary | ICD-10-CM

## 2015-04-20 NOTE — Therapy (Signed)
[facility] [address] [place], [place], [address] Phone: [phone]   Fax:  [phone]  Occupational Therapy Treatment  Patient Details  Name: Mario [name] MRN: [id number] Date of Birth: August 11, 1975 Referring Provider: Blane Ohara  Encounter Date: 04/20/2015      OT End of Session - 04/20/15 1053    Date for OT Re-Evaluation 05/27/15      Past Medical History  Diagnosis Date  . Hypertension     Past Surgical History  Procedure Laterality Date  . No past surgeries      There were no vitals filed for this visit.  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 No/denies   Pain Score 0-No pain                      OT Treatments/Exercises (OP) - 04/20/15 0001    ADLs   LB Dressing Patient issued a new ankle brace (aircast) in PT session just prior to OT session.  Patient is to wear ankle brace when walking without shoes on.  Patient able to doff ankle brace without assist, and needed min cueing / instruction to don and properly strap brace.  Patient's father not present to review process during either PT or OT session.  Patient also issued new AFO to be worn in shoe.  New brace has T strap at ankle to help reduce supination.  Reviewed process to don/doff new AFO, although did not practice this session.     Neurological Re-education Exercises   Other Exercises 1 Neurological reeducation to address unilateral reach, pinch and release in sitting.  Patient beginning to be able to coordinate multiple joints simultaneously with min cueing / facilitation to utilize right arm  functionally.  Patient demonstrates excessive coactivation in trunk and  shoulder initially during early stages of reach, however, with cueing can override this motor pattern.    Other Exercises 2 Worked in supine, sitting and standing on interlimb coordination to address patient's ability to orient his hand as needed for various functional tasks.  Patient with best response during guided (modified closed chain) versus true open chain activity.                  OT Education - 04/20/15 1050    Education provided Yes   Education Details Ankle brace and new AFO don and doff technique, wearing schedule   Person(s) Educated Patient   Methods Explanation;Demonstration   Comprehension Verbal cues required;Need further instruction;Tactile cues required          OT Short Term Goals - 04/08/15 1206    OT SHORT TERM GOAL #5   Status --  pt able to use as gross assist for some parts of ADL activity   OT SHORT TERM GOAL #6   Title Pt will be mod I with upgraded HEP - 04/29/2015   Status On-going   OT SHORT TERM GOAL #7   Title Pt will demonstrate ability to cast fishing pole with RUE with no more than mod assist   Status On-going   OT SHORT TERM GOAL #8   Title Pt will  be able to use RUE as stabilizer during hot meal prep   Status On-going   OT SHORT TERM GOAL  #9   TITLE Pt will demonstrate ability to pick up and place light object at 90* of reach with RUE with min compensations.   Status On-going           OT Long Term Goals - 04/08/15 1207    OT LONG TERM GOAL #6   Title --  moved to STG on 03/30/2015   OT LONG TERM GOAL #8   Title Pt will be mod I with upgraded HEP prn - 05/27/2015   Status On-going   OT LONG TERM GOAL  #9   Baseline Pt will demonstrate ability to overhead reach to grasp light object off shelf with RUE with no more than min facilitation   Status On-going   OT LONG TERM GOAL  #10   TITLE Pt will demostrate ability to use RUE as gross assist for 100% of  basic self care tasks.   Status On-going   OT LONG TERM GOAL  #11   TITLE Pt will be able to use RUE as gross assist during hot meal prep   Status On-going               Plan - 04/20/15 1053    Clinical Impression Statement Patient continues to show improved active movement and control in right upper extremity, progressing toward long term goals.     Pt will benefit from skilled therapeutic intervention in order to improve on the following deficits (Retired) Abnormal gait;Decreased balance;Decreased coordination;Decreased cognition;Decreased knowledge of use of DME;Decreased mobility;Decreased range of motion;Decreased strength;Increased edema;Impaired UE functional use;Impaired tone;Impaired sensation;Pain   Rehab Potential Good   Clinical Impairments Affecting Rehab Potential global aphasia, apraxia, impaired sensation   OT Frequency 2x / week   OT Duration 8 weeks   OT Treatment/Interventions Self-care/ADL training;Ultrasound;Moist Heat;Electrical Stimulation;DME and/or AE instruction;Neuromuscular education;Therapeutic exercise;Functional Mobility Training;Manual Therapy;Passive range of motion;Splinting;Therapeutic activities;Balance training;Patient/family education;Cognitive remediation/compensation   Plan NMR to RUE for reach in supine, sitting, standing, guided right reach in mid range,    Consulted and Agree with Plan of Care Patient        Problem List Patient Active Problem List   Diagnosis Date Noted  . Spastic hemiplegia affecting right dominant side (HCC) 04/13/2015  . 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 Right hemiparesis (HCC) 10/21/2014  . Aphasia due to stroke 10/21/2014    Collier SalinaGellert, Kavita Bartl M 04/20/2015, 10:56 AM, OTR/L  [facility] [address] [place], [place], [address] Phone: [phone]   Fax:   [phone]  Name: Mario [name] MRN: [id number] Date of Birth: 11-20-1975

## 2015-04-20 NOTE — Therapy (Signed)
[name] [address] [place], [place], [address] Phone: [phone]   Fax:  [phone]  Speech Language Pathology Treatment  Patient Details  Name: Mario [name] MRN: [id number] Date of Birth: [birth year] No Data Recorded  Encounter Date: 04/20/2015      End of Session - 04/20/15 1116    Visit Number 21   Number of Visits 33   Date for SLP Re-Evaluation 06/04/15   SLP Start Time 0932   SLP Stop Time  2725   SLP Time Calculation (min) 43 min   Activity Tolerance Patient tolerated treatment well      Past Medical History  Diagnosis Date  . Hypertension     Past Surgical History  Procedure Laterality Date  . No past surgeries      There were no vitals filed for this visit.  Visit Diagnosis: Expressive aphasia  Verbal apraxia      Subjective Assessment - 04/20/15 0937    Subjective "Don't know" (repsonse to what did you do this weekend?)   Currently in Pain? No/denies               ADULT SLP TREATMENT - 04/20/15 0938    General Information   Behavior/Cognition Alert;Cooperative;Pleasant mood   Treatment Provided   Treatment provided Cognitive-Linquistic   Pain Assessment   Pain Assessment No/denies pain   Cognitive-Linquistic Treatment   Treatment focused on Aphasia;Apraxia   Skilled Treatment Verbal expression with frequent verbal instructions to use syllables to approximate words. Pt continues to required modeling/repeptition to approximate words. Anomia evident today, mod questioning cues requied when pt could not think of words re:Thanksgiving meal. - written expression to augment verbal exprssion with 70% accuracy in assiting to convey message.     Assessment / Recommendations / Plan   Plan Continue with current plan of care   Progression Toward Goals   Progression toward goals Not progressing toward goals (comment)          SLP Education - 04/20/15 1111    Education  provided Yes   Education Details [employer] for possible AAC device; use syllables to approximate word   Person(s) Educated Patient   Methods Explanation;Demonstration   Comprehension Verbalized understanding;Returned demonstration;Need further instruction          SLP Short Term Goals - 04/20/15 1116    SLP SHORT TERM GOAL #6   Title pt will provide functional multimodal responses in simple conversation with occasional min A   Baseline for 4 weeks or 7 visits, for all STGs   Time 4   Period Weeks   Status New   SLP SHORT TERM GOAL #7   Title pt will demo understanding of mod complex conversation with repeats allowed 90%   Time 4   Period Weeks   Status On-going   SLP SHORT TERM GOAL #8   Title pt will write one word responses to communicate functionally, with multiple attempts allowed, achieving 75% listener comprehension   Time 4   Period Weeks   Status On-going          SLP Long Term Goals - 04/20/15 1116    SLP LONG TERM GOAL #1   Title pt will demo undertanding of simple/mod complex conversational questions via yes/no responses 80% accuracy   Period --  or 16 visits - for all LTGs   Status Achieved   SLP LONG TERM GOAL #2   Title pt will demo undertanding of simple conversational quesitons  via yes/no responses 90% accuracy   Status Achieved   SLP LONG TERM GOAL #3   Title pt will perform rote speech tasks simultaneously with SLP with 40% accuracy (functional responses allowed)   Status Not Met   SLP LONG TERM GOAL #4   Title pt will imitate salient words/family names with 20% success   Status Achieved   SLP LONG TERM GOAL #5   Title pt will demo use of simple alternative/augmentative communcation and/or multimodal communication with 80% success in mod complex message conveyance   Baseline 8 weeks or 15 more visits   Time 8   Status Revised   SLP LONG TERM GOAL #6   Title pt will state family names/information and other salient personal 2-3 word  information with 80% success   Time 8   Period Weeks   Status On-going          Plan - 04/20/15 1112    Clinical Impression Statement Pt continues to required verbal instruction and modeling to use compensations such as correct number of syllables to approximate words/answers. Independent carryover of this is relatively reduced. Pt requried cues to use drawing/writing to augment verbal expression. Due to aphasia, drawing and writing 70% successful with known topic and questioning cues.  D/w pt to consider augmentative /alternative communication device, and possible referral to [employer]. Father was provided the paperwork and contact information for this. Continue skilled ST to maximize verbal expression, multimodal communication and word finding.    Speech Therapy Frequency 2x / week   Treatment/Interventions SLP instruction and feedback;Compensatory strategies;Internal/external aids;Environmental controls;Patient/family education;Functional tasks;Cueing hierarchy;Multimodal communcation approach   Potential to Achieve Goals Fair   Potential Considerations Severity of impairments   Consulted and Agree with Plan of Care Patient        Problem List Patient Active Problem List   Diagnosis Date Noted  . Spastic hemiplegia affecting right dominant side ([place]) 04/13/2015  . 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 Left middle cerebral artery stroke ([place]) 10/21/2014  . Right hemiparesis ([place]) 10/21/2014  . Aphasia due to stroke 10/21/2014    Lovvorn, Annye Rusk MS, CCC-SLP 04/20/2015, 11:17 AM  [facility] [address] [place], [place], [address] Phone: [phone]   Fax:  [phone]   Name: Mario [name] MRN: [id number] Date of Birth: [birth year]

## 2015-04-20 NOTE — Patient Instructions (Signed)
Bridging (Single Leg)    Lie on back with feet shoulder width apart and left leg straight. Lift hips toward the ceiling while keeping leg straight. Hold __2__ seconds. Repeat _10___ times. Do ___2_ sessions per day.  Dad you may have to keep R ankle stable while he does these to prevent ankle from rolling over.    http://[email or web address].exer.us/358   Copyright  VHI. All rights reserved.

## 2015-04-20 NOTE — Therapy (Signed)
[facility] [address] [place], [place], [address] Phone: [phone]   Fax:  [phone]  Physical Therapy Treatment  Patient Details  Name: Mario [name] MRN: [id number] Date of Birth: 12/07/1975 No Data Recorded  Encounter Date: 04/20/2015      PT End of Session - 04/20/15 0804    Visit Number 18   Number of Visits 25   Date for PT Re-Evaluation 05/15/15   PT Start Time 0800   PT Stop Time 0845   PT Time Calculation (min) 45 min   Equipment Utilized During Treatment Gait belt   Activity Tolerance Patient tolerated treatment well   Behavior During Therapy [facility] for tasks assessed/performed      Past Medical History  Diagnosis Date  . Hypertension     Past Surgical History  Procedure Laterality Date  . No past surgeries      There were no vitals filed for this visit.  Visit Diagnosis:  Abnormality of gait  Unsteadiness  Weakness due to cerebrovascular accident  Spastic hemiplegia affecting right dominant side ([place])      Subjective Assessment - 04/20/15 0803    Subjective Reports no changes since last visit, no falls.  Father reports that R foot is "staying straight" when he removes L shoe.    Patient is accompained by: Family member   Limitations Walking;House hold activities;Reading;Standing;Writing   Patient Stated Goals Improve speech, be able to walk better, and get functional use of RUE.    Currently in Pain? No/denies              NMR:  Addressed increased R glute med activation in tall kneeling with L knee on unstable surface while reaching with LUE to place clothes pins to target on R side and superiorly to increased forward translation of hip over LE to carry over to stance phase of gait.  Transitioned to RLE only bridging x 10 reps with RLE off EOM on block and PT holding LLE to prevent over use of LLE.  Feel that this would be difficult to replicate at home and father not  present to educate.  Therefore tried RLE only bridging with pt holding LLE in SLR position.  Still needs assist at R ankle to prevent inversion.  Provided handout with typed cues for father to provide assist at ankle.  Progressed to sit<>stand with L foot on step with green disc to increase WB and weight shift through RLE.  Once in standing provided tactile cues at hip for increased protraction.  Performed x 10 reps with 5 second holds.  Tolerated well.  Note all NMR activity was used in conjunction with mirror for increased visual feedback for posture and increased activation of muscle groups during activity.   Ortho fit training:  Gerald Stabs present from [employer] at end of session to provide/don pt with new R ASO and R reaction AFO.  Donned both and utilized together for first trial of gait x 115' at mod I level.  Note improvement in L ankle stability however still note some increased extension at knee (better with cues).   Then assessed gait with R ASO and shoe only for safety at home.  Pt able to ambulate with improved ankle control, slight supination noted, but still improved.  Again, needs cues for control at knee during gait.  Education to pt on how to don, however would like to go over again and esp when father present.  PT Education - 04/20/15 0936    Education provided Yes   Education Details Education on new ASO and R AFO.  Father not present to educate, but highly recommend that pt wear ASO when not wearing shoes at home to prevent ankle injury.  Also provided pt with new HEP exercise.    Person(s) Educated Patient   Methods Explanation;Handout;Demonstration   Comprehension Verbalized understanding;Returned demonstration;Need further instruction          PT Short Term Goals - 03/04/15 1035    PT SHORT TERM GOAL #1   Title Pt will improve BERG balance score to 56/56 to decrease fall risk and improve functional mobility. (Target 03/03/15)   Baseline 55/56 on  03/04/15   Status Not Met   PT SHORT TERM GOAL #2   Title Pt will improve gait speed to 2.83 ft/sec to improve functional ambulation level to community ambulator.   (Target 03/03/15)   Baseline 2.00 ft/sec on 03/04/15, feel that this is due to increased tone.    Status Not Met   PT SHORT TERM GOAL #3   Title Pt will ambulate >500' on indoor and outdoor surfaces including grass, inclines and curb step without AD with AFO at S level without overt LOB to indicate safe negotiation of indoor and outdoor surfaces.  (Target 03/03/15)   Baseline met on 03/03/15, however recommend use of cane at home for increased safety and to decrease tone with gait.     Status Achieved   PT SHORT TERM GOAL #4   Title Pt will negotiate up/down 4 steps in step to pattern with single rail at mod I level with safe technique to traverse community obstacles.   (Target 03/03/15)   Baseline Met on 9/22.   Status Achieved   PT SHORT TERM GOAL #5   Title Pt will improve 3MWT to 498' to improve ambulation function and functional endurance.    Baseline 366' on 03/03/15   Status Not Met           PT Long Term Goals - 04/20/15 0947    PT LONG TERM GOAL #1   Title Pts SIS mobility score will improve to 80% to indicate improved perceived functional mobility.  (Modified Target 05/15/15)  Target dates updated due to missing 2 wks of PT   Baseline 66.7% on 8/31   Time 8   Period Weeks   Status On-going   PT LONG TERM GOAL #2   Title Pt will improve ankle DF strength to 3/5 in order to increase safety with household ambulation and begin to address ambulation without use of AFO.  (Modified Target 05/15/15)   Baseline 3-/5 on 04/14/15   Time 8   Period Weeks   Status On-going   PT LONG TERM GOAL #3   Title Pt will verbalize ability to return to leisure activities to indicate return to community function.   (Modified Target 05/15/15)   Time 8   Period Weeks   Status On-going   PT LONG TERM GOAL #4   Title Pt will negotiate  up/down 4 steps without rail in alternating fashion to traverse community stairs while carrying object.  (Modified Target 05/15/15)   Baseline Met 11/9.   Time 8   Period Weeks   Status Achieved   PT LONG TERM GOAL #5   Title Pt will demonstrate ability to ambulate up to 150' with decreased R genu recurvatum to improve R knee stability/strength and prevent knee injury.  (Target Date: 04/16/15)  Baseline met 04/14/15   Time 8   Period Weeks   Status Achieved   PT LONG TERM GOAL #6   Title Pt will demonstrate ability to perform SLS x 5 secs on RLE to improve balance and awareness of RLE. (Modified Target Date: 05/15/15)   Time 8   Period Weeks   Status On-going               Plan - 04/20/15 0804    Clinical Impression Statement Skilled session focused on increased sustained activation of R glute med with tall kneeling, supine exercise and sit<>stand.  Tolerated well.  Note Gerald Stabs from [employer] present during end of session to provide pt with new R reaction AFO w/ supination strap and also R ASO for ankle stability when at home not wearing shoe.  Note improvement in supination control with each brace.  Father not present to educate.    Pt will benefit from skilled therapeutic intervention in order to improve on the following deficits Abnormal gait;Decreased balance;Decreased coordination;Decreased knowledge of precautions;Decreased safety awareness;Decreased strength;Difficulty walking;Impaired perceived functional ability;Impaired sensation;Impaired flexibility;Impaired tone;Impaired UE functional use;Postural dysfunction;Decreased knowledge of use of DME   Rehab Potential Excellent   Clinical Impairments Affecting Rehab Potential adhering to safety precautions   PT Frequency 2x / week   PT Duration 4 weeks   PT Treatment/Interventions ADLs/Self Care Home Management;Electrical Stimulation;Biofeedback;DME Instruction;Gait training;Stair training;Functional mobility training;Therapeutic  exercise;Balance training;Neuromuscular re-education;Patient/family education;Manual techniques;Visual/perceptual remediation/compensation   PT Next Visit Plan  Gait training with new AFO w/ supination strap and then with ASO only, have pt work on donning/doffing.   R knee stability (without AFO), glute med activation (sustained), stretching heel cord (ensuring ankle in neutral, eversion stretch, isolated hip/knee flex with arms in shoulder flexed/elbows extended position to decrease retraction on R side, RLE WB.   PT Home Exercise Plan see pt instruction   Consulted and Agree with Plan of Care Patient        Problem List Patient Active Problem List   Diagnosis Date Noted  . Spastic hemiplegia affecting right dominant side ([place]) 04/13/2015  . 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 Left middle cerebral artery stroke ([place]) 10/21/2014  . Right hemiparesis ([place]) 10/21/2014  . Aphasia due to stroke 10/21/2014    Cameron Sprang, PT, MPT [facility] [address] [place], [place], [address] Phone: [phone]   Fax:  [phone] 04/20/2015, 9:51 AM  Name: Mario [name] MRN: [id number] Date of Birth: Oct 13, 1975

## 2015-04-20 NOTE — Patient Instructions (Signed)
Slow down think of syllables and sounds - use syllables correctly to approximate word

## 2015-04-22 ENCOUNTER — Ambulatory Visit: Payer: BC Managed Care – PPO | Admitting: Rehabilitation

## 2015-04-22 ENCOUNTER — Encounter: Payer: Self-pay | Admitting: Rehabilitation

## 2015-04-22 ENCOUNTER — Ambulatory Visit: Payer: BC Managed Care – PPO | Admitting: Occupational Therapy

## 2015-04-22 ENCOUNTER — Encounter: Payer: Self-pay | Admitting: Occupational Therapy

## 2015-04-22 ENCOUNTER — Ambulatory Visit: Payer: BC Managed Care – PPO

## 2015-04-22 DIAGNOSIS — IMO0002 Reserved for concepts with insufficient information to code with codable children: Secondary | ICD-10-CM

## 2015-04-22 DIAGNOSIS — G8111 Spastic hemiplegia affecting right dominant side: Secondary | ICD-10-CM

## 2015-04-22 DIAGNOSIS — R4701 Aphasia: Secondary | ICD-10-CM

## 2015-04-22 DIAGNOSIS — R482 Apraxia: Secondary | ICD-10-CM

## 2015-04-22 DIAGNOSIS — R2681 Unsteadiness on feet: Secondary | ICD-10-CM

## 2015-04-22 DIAGNOSIS — R201 Hypoesthesia of skin: Secondary | ICD-10-CM

## 2015-04-22 DIAGNOSIS — R269 Unspecified abnormalities of gait and mobility: Secondary | ICD-10-CM
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
## 2015-04-22 NOTE — Therapy (Signed)
[facility] [address] [place], [place], [address] Phone: [phone]   Fax:  [phone]  Speech Language Pathology Treatment  Patient Details  Name: Mario [name] MRN: [id number] Date of Birth: 1975/08/07 No Data Recorded  Encounter Date: 04/22/2015      End of Session - 04/22/15 4403    Visit Number 23   Number of Visits 33   Date for SLP Re-Evaluation 06/04/15   SLP Start Time 0850   SLP Stop Time  0931   SLP Time Calculation (min) 41 min   Activity Tolerance Patient tolerated treatment well      Past Medical History  Diagnosis Date  . Hypertension     Past Surgical History  Procedure Laterality Date  . No past surgeries      There were no vitals filed for this visit.  Visit Diagnosis: Expressive aphasia  Verbal apraxia  Receptive aphasia      Subjective Assessment - 04/22/15 0853    Subjective "Hey."               ADULT SLP TREATMENT - 04/22/15 0854    General Information   Behavior/Cognition Alert;Cooperative;Pleasant mood   Treatment Provided   Treatment provided Cognitive-Linquistic   Pain Assessment   Pain Assessment No/denies pain   Cognitive-Linquistic Treatment   Treatment focused on Aphasia;Apraxia   Skilled Treatment Verbal expression practice with SLP today targeting compensations for verbal expression - pt approximated single words/2 word phrases -mod SLP cues req'd to have pt use syllables and reduce rate to increase verbal expression. Anomia noted requiring SLP mod cues usually to A pt with word finding. WRiting encouraged to augment verbal expression with success approx 50% to A SLP in comprehension. In simple conversation pt was more successful with writing to augment information - but mod SLP cues still needed rarely. Some repeating of questions needed by SLP.   Assessment / Recommendations / Plan   Plan Continue with current plan of care   Progression Toward  Goals   Progression toward goals --  pt cont work on goals            SLP Short Term Goals - 04/22/15 0855    SLP SHORT TERM GOAL #6   Title pt will provide functional multimodal responses (except writing) in simple conversation with occasional min A   Baseline for 2 weeks or 4 visits, for all STGs   Time 2   Period Weeks   Status On-going   SLP SHORT TERM GOAL #7   Title pt will demo understanding of mod complex conversation with repeats allowed 90%   Time 2   Period Weeks   Status On-going   SLP SHORT TERM GOAL #8   Title pt will write one word responses to communicate functionally, with multiple attempts allowed, achieving 75% listener comprehension   Time --   Period --   Status Achieved          SLP Long Term Goals - 04/22/15 0856    SLP LONG TERM GOAL #1   Title pt will demo undertanding of simple/mod complex conversational questions via yes/no responses 80% accuracy   Period --  or 16 visits - for all LTGs   Status Achieved   SLP LONG TERM GOAL #2   Title pt will demo undertanding of simple conversational quesitons via yes/no responses 90% accuracy   Status Achieved   SLP LONG TERM GOAL #3   Title pt will perform rote  speech tasks simultaneously with SLP with 40% accuracy (functional responses allowed)   Status Not Met   SLP LONG TERM GOAL #4   Title pt will imitate salient words/family names with 20% success   Status Achieved   SLP LONG TERM GOAL #5   Title pt will demo use of simple alternative/augmentative communcation and/or multimodal communication with 80% success in mod complex message conveyance   Baseline 6 weeks or 12 more visits   Time 6   Status Revised   SLP LONG TERM GOAL #6   Title pt will state family names/information and other salient personal 2-3 word information with 80% success   Time 6   Period Weeks   Status On-going          Plan - 04/22/15 1448    Clinical Impression Statement Pt continues to required verbal instruction  and modeling to use compensations such as correct number of syllables to approximate words/answers. Independent carryover of this is relatively reduced. Pt requried cues to use drawing/writing to augment verbal expression. Due to aphasia, drawing and writing 70% successful with known topic and questioning cues.  D/w pt to consider augmentative /alternative communication device, and possible referral to [employer]. Father was provided the paperwork and contact information for this. Continue skilled ST to maximize verbal expression, multimodal communication and word finding.    Speech Therapy Frequency 2x / week   Duration --  6 weeks or 12 visits   Treatment/Interventions SLP instruction and feedback;Compensatory strategies;Internal/external aids;Environmental controls;Patient/family education;Functional tasks;Cueing hierarchy;Multimodal communcation approach   Potential to Achieve Goals Fair   Potential Considerations Severity of impairments        Problem List Patient Active Problem List   Diagnosis Date Noted  . Spastic hemiplegia affecting right dominant side ([place]) 04/13/2015  . 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 Left middle cerebral artery stroke ([place]) 10/21/2014  . Right hemiparesis ([place]) 10/21/2014  . Aphasia due to stroke 10/21/2014    [facility] , MS, CCC-SLP  04/22/2015, 12:01 PM  [facility] [address] [place], [place], [address] Phone: [phone]   Fax:  [phone]   Name: Mario [name] MRN: [id number] Date of Birth: August 07, 1975

## 2015-04-22 NOTE — Therapy (Signed)
[facility] [address] [place], [place], [address] Phone: [phone]   Fax:  [phone]  Physical Therapy Treatment  Patient Details  Name: Mario [name] MRN: [id number] Date of Birth: 1975/12/29 No Data Recorded  Encounter Date: 04/22/2015      PT End of Session - 04/22/15 1154    Visit Number 19   Number of Visits 25   Date for PT Re-Evaluation 05/15/15   PT Start Time 0174   PT Stop Time 1100   PT Time Calculation (min) 45 min   Equipment Utilized During Treatment Gait belt   Activity Tolerance Patient tolerated treatment well   Behavior During Therapy [facility] for tasks assessed/performed      Past Medical History  Diagnosis Date  . Hypertension     Past Surgical History  Procedure Laterality Date  . No past surgeries      There were no vitals filed for this visit.  Visit Diagnosis:  Abnormality of gait  Unsteadiness  Weakness due to cerebrovascular accident  Spastic hemiplegia affecting right dominant side ([place])      Subjective Assessment - 04/22/15 1019    Subjective Prior to session Father reported that brace was rubbing pts skin and causing pain.  Addressed with OT during session to decrease pain.    Limitations Walking;House hold activities;Reading;Standing;Writing   Patient Stated Goals Improve speech, be able to walk better, and get functional use of RUE.    Currently in Pain? No/denies                 NMR:  Addressed RUE/LE weight bearing and adequate weight shift to R with sustained movements.  Provided stability at RUE at elbow and wrist with cues for lateral weight shift over shoulder rather than compensatory trunk rotation.  Had pt reach over RUE with LUE for targets placed higher than head height with cues for increased R WB and "push" through RLE for increased glute/quad activation.  From there moved target more anterior with R hand on arm of chair with therapist  assisting at RLE to ensure increased weight shift and WB on RLE.  Progressed to standing task with LLE up on unstable rolling stool while PT assisted with stability of R shoulder to prevent depression with upward reach again to increase activation and downward push/extension through RLE.  Also continued to address sustained glute med activation in standing with several activities as well as gait providing facilitation at trunk for upright posture and also intermittently at R hip/glute to increased activation with cues for slower step on LLE.  Also provided resistance anteriorly while walking again to maintain forward weight shift with walking rather than overt lateral weight shift.  Pt tolerated all well with moderate c/o fatigue at end of session.                 PT Education - 04/22/15 1153    Education provided Yes   Education Details education on slowing gait to further focus on postural control and increased R stance time.   Person(s) Educated Patient   Methods Explanation   Comprehension Verbalized understanding          PT Short Term Goals - 03/04/15 1035    PT SHORT TERM GOAL #1   Title Pt will improve BERG balance score to 56/56 to decrease fall risk and improve functional mobility. (Target 03/03/15)   Baseline 55/56 on 03/04/15   Status Not Met   PT SHORT TERM GOAL #  2   Title Pt will improve gait speed to 2.83 ft/sec to improve functional ambulation level to community ambulator.   (Target 03/03/15)   Baseline 2.00 ft/sec on 03/04/15, feel that this is due to increased tone.    Status Not Met   PT SHORT TERM GOAL #3   Title Pt will ambulate >500' on indoor and outdoor surfaces including grass, inclines and curb step without AD with AFO at S level without overt LOB to indicate safe negotiation of indoor and outdoor surfaces.  (Target 03/03/15)   Baseline met on 03/03/15, however recommend use of cane at home for increased safety and to decrease tone with gait.     Status  Achieved   PT SHORT TERM GOAL #4   Title Pt will negotiate up/down 4 steps in step to pattern with single rail at mod I level with safe technique to traverse community obstacles.   (Target 03/03/15)   Baseline Met on 9/22.   Status Achieved   PT SHORT TERM GOAL #5   Title Pt will improve 3MWT to 498' to improve ambulation function and functional endurance.    Baseline 366' on 03/03/15   Status Not Met           PT Long Term Goals - 04/20/15 0947    PT LONG TERM GOAL #1   Title Pts SIS mobility score will improve to 80% to indicate improved perceived functional mobility.  (Modified Target 05/15/15)  Target dates updated due to missing 2 wks of PT   Baseline 66.7% on 8/31   Time 8   Period Weeks   Status On-going   PT LONG TERM GOAL #2   Title Pt will improve ankle DF strength to 3/5 in order to increase safety with household ambulation and begin to address ambulation without use of AFO.  (Modified Target 05/15/15)   Baseline 3-/5 on 04/14/15   Time 8   Period Weeks   Status On-going   PT LONG TERM GOAL #3   Title Pt will verbalize ability to return to leisure activities to indicate return to community function.   (Modified Target 05/15/15)   Time 8   Period Weeks   Status On-going   PT LONG TERM GOAL #4   Title Pt will negotiate up/down 4 steps without rail in alternating fashion to traverse community stairs while carrying object.  (Modified Target 05/15/15)   Baseline Met 11/9.   Time 8   Period Weeks   Status Achieved   PT LONG TERM GOAL #5   Title Pt will demonstrate ability to ambulate up to 150' with decreased R genu recurvatum to improve R knee stability/strength and prevent knee injury.  (Target Date: 04/16/15)   Baseline met 04/14/15   Time 8   Period Weeks   Status Achieved   PT LONG TERM GOAL #6   Title Pt will demonstrate ability to perform SLS x 5 secs on RLE to improve balance and awareness of RLE. (Modified Target Date: 05/15/15)   Time 8   Period Weeks    Status On-going               Plan - 04/22/15 1154    Clinical Impression Statement Skilled session continues to focus on sustained activation on RLE as well as RUE this session.   Also focused on gait for improved postural control and increased R stance time.  Note that postural control somewhat improved following session, however pt has to maintain slower gait speed for  improved carryover.     Pt will benefit from skilled therapeutic intervention in order to improve on the following deficits Abnormal gait;Decreased balance;Decreased coordination;Decreased knowledge of precautions;Decreased safety awareness;Decreased strength;Difficulty walking;Impaired perceived functional ability;Impaired sensation;Impaired flexibility;Impaired tone;Impaired UE functional use;Postural dysfunction;Decreased knowledge of use of DME   Rehab Potential Excellent   Clinical Impairments Affecting Rehab Potential adhering to safety precautions   PT Frequency 2x / week   PT Duration 4 weeks   PT Treatment/Interventions ADLs/Self Care Home Management;Electrical Stimulation;Biofeedback;DME Instruction;Gait training;Stair training;Functional mobility training;Therapeutic exercise;Balance training;Neuromuscular re-education;Patient/family education;Manual techniques;Visual/perceptual remediation/compensation   PT Next Visit Plan  Gait training with new AFO w/ supination strap and then with ASO only, have pt work on donning/doffing.   R knee stability (without AFO), glute med activation (sustained), stretching heel cord (ensuring ankle in neutral, eversion stretch, isolated hip/knee flex with arms in shoulder flexed/elbows extended position to decrease retraction on R side, RLE WB.   Consulted and Agree with Plan of Care Patient        Problem List Patient Active Problem List   Diagnosis Date Noted  . Spastic hemiplegia affecting right dominant side ([place]) 04/13/2015  . Apraxia following CVA (cerebrovascular  accident) 10/29/2014  . Left middle cerebral artery stroke ([place]) 10/21/2014  . Right hemiparesis ([place]) 10/21/2014  . Aphasia due to stroke 10/21/2014    Cameron Sprang, PT, MPT [facility] [address] [place], [place], [address] Phone: [phone]   Fax:  [phone] 04/22/2015, 12:03 PM  Name: TRUMAINE WIMER MRN: [id number] Date of Birth: [birth year]

## 2015-04-27 ENCOUNTER — Encounter: Payer: Self-pay | Admitting: Occupational Therapy

## 2015-04-27 ENCOUNTER — Ambulatory Visit: Payer: BC Managed Care – PPO | Admitting: Rehabilitation

## 2015-04-27 ENCOUNTER — Encounter: Payer: Self-pay | Admitting: Rehabilitation

## 2015-04-27 ENCOUNTER — Ambulatory Visit: Payer: BC Managed Care – PPO | Admitting: Occupational Therapy

## 2015-04-27 DIAGNOSIS — IMO0002 Reserved for concepts with insufficient information to code with codable children: Secondary | ICD-10-CM

## 2015-04-27 DIAGNOSIS — R269 Unspecified abnormalities of gait and mobility: Secondary | ICD-10-CM

## 2015-04-27 DIAGNOSIS — G8111 Spastic hemiplegia affecting right dominant side: Secondary | ICD-10-CM | POA: Diagnosis not present

## 2015-04-27 DIAGNOSIS — R201 Hypoesthesia of skin: Secondary | ICD-10-CM

## 2015-04-27 DIAGNOSIS — R2681 Unsteadiness on feet: Secondary | ICD-10-CM

## 2015-04-27 DIAGNOSIS — R4189 Other symptoms and signs involving cognitive functions and awareness: Secondary | ICD-10-CM

## 2015-04-27 NOTE — Therapy (Signed)
[facility] [address] [place], [place], [address] Phone: [phone]   Fax:  [phone]  Physical Therapy Treatment  Patient Details  Name: Mario [name] MRN: [id number] Date of Birth: May 30, 1976 No Data Recorded  Encounter Date: 04/27/2015      PT End of Session - 04/27/15 1003    Visit Number 20   Number of Visits 25   Date for PT Re-Evaluation 05/15/15   PT Start Time 0930   PT Stop Time 1015   PT Time Calculation (min) 45 min   Equipment Utilized During Treatment Gait belt   Activity Tolerance Patient tolerated treatment well   Behavior During Therapy [facility] for tasks assessed/performed      Past Medical History  Diagnosis Date  . Hypertension     Past Surgical History  Procedure Laterality Date  . No past surgeries      There were no vitals filed for this visit.  Visit Diagnosis:  Abnormality of gait  Unsteadiness  Weakness due to cerebrovascular accident  Spastic hemiplegia affecting right dominant side ([place])      Subjective Assessment - 04/27/15 0933    Subjective No changes since last visit, no falls.    Patient is accompained by: Family member   Limitations Walking;House hold activities;Reading;Standing;Writing   Patient Stated Goals Improve speech, be able to walk better, and get functional use of RUE.    Currently in Pain? No/denies             NMR: Addressed RUE/LE weight bearing and adequate weight shift to R with sustained movements. Had pt reach over RUE (with assist from therapist for ROM and at scapular for adequate scapulohumeral movement) up and to the R for target on wall with LLE first placed on wooden step progressing to holding in the air for RLE SLS with cues for increased R WB and "push" through RLE for increased glute/quad activation. Pt did very well with task with great activatio in RLE.  Progressed task in similar manner with LLE on step to decrease  activation with PT assisting reach of RUE to grasp object held by rehab tech.  Tolerated well with noted increased tone in R hand with increased effort of task. Performed tall kneeling task with LUE reaching for clothes pins to tall pole with PT facilitating increased R lateral weight shift and increased glute activation on RLE.  Ended with gait without AD and with R AFO providing facilitation at trunk for upright posture and also intermittently at R hip/glute to increased activation with cues for slower step on LLE. Note marked improvement with postural control and improved speed to allow increased WB time on RLE with PT facilitating increased R hamstring to fully clear RLE during swing.  Addressed hamstring activation during session with standing task attempting to isolate R hamstring, however pt tends to compensate with increased hip and trunk movement, therefore transitioned to prone with R hip in slight extension activating R hamtrings with knee flex task.  Utilized [employer], [employer] for increased visual feedback on task with heavy cues and assist to decreased hip/trunk compensations.                     PT Education - 04/27/15 1024    Education provided Yes   Education Details Education on importance of increased knee flex during gait, continuing therapy through botox injection.    Person(s) Educated Patient   Methods Explanation   Comprehension Verbalized understanding  PT Short Term Goals - 03/04/15 1035    PT SHORT TERM GOAL #1   Title Pt will improve BERG balance score to 56/56 to decrease fall risk and improve functional mobility. (Target 03/03/15)   Baseline 55/56 on 03/04/15   Status Not Met   PT SHORT TERM GOAL #2   Title Pt will improve gait speed to 2.83 ft/sec to improve functional ambulation level to community ambulator.   (Target 03/03/15)   Baseline 2.00 ft/sec on 03/04/15, feel that this is due to increased tone.    Status Not Met   PT SHORT TERM GOAL #3   Title  Pt will ambulate >500' on indoor and outdoor surfaces including grass, inclines and curb step without AD with AFO at S level without overt LOB to indicate safe negotiation of indoor and outdoor surfaces.  (Target 03/03/15)   Baseline met on 03/03/15, however recommend use of cane at home for increased safety and to decrease tone with gait.     Status Achieved   PT SHORT TERM GOAL #4   Title Pt will negotiate up/down 4 steps in step to pattern with single rail at mod I level with safe technique to traverse community obstacles.   (Target 03/03/15)   Baseline Met on 9/22.   Status Achieved   PT SHORT TERM GOAL #5   Title Pt will improve 3MWT to 498' to improve ambulation function and functional endurance.    Baseline 366' on 03/03/15   Status Not Met           PT Long Term Goals - 04/20/15 0947    PT LONG TERM GOAL #1   Title Pts SIS mobility score will improve to 80% to indicate improved perceived functional mobility.  (Modified Target 05/15/15)  Target dates updated due to missing 2 wks of PT   Baseline 66.7% on 8/31   Time 8   Period Weeks   Status On-going   PT LONG TERM GOAL #2   Title Pt will improve ankle DF strength to 3/5 in order to increase safety with household ambulation and begin to address ambulation without use of AFO.  (Modified Target 05/15/15)   Baseline 3-/5 on 04/14/15   Time 8   Period Weeks   Status On-going   PT LONG TERM GOAL #3   Title Pt will verbalize ability to return to leisure activities to indicate return to community function.   (Modified Target 05/15/15)   Time 8   Period Weeks   Status On-going   PT LONG TERM GOAL #4   Title Pt will negotiate up/down 4 steps without rail in alternating fashion to traverse community stairs while carrying object.  (Modified Target 05/15/15)   Baseline Met 11/9.   Time 8   Period Weeks   Status Achieved   PT LONG TERM GOAL #5   Title Pt will demonstrate ability to ambulate up to 150' with decreased R genu recurvatum  to improve R knee stability/strength and prevent knee injury.  (Target Date: 04/16/15)   Baseline met 04/14/15   Time 8   Period Weeks   Status Achieved   PT LONG TERM GOAL #6   Title Pt will demonstrate ability to perform SLS x 5 secs on RLE to improve balance and awareness of RLE. (Modified Target Date: 05/15/15)   Time 8   Period Weeks   Status On-going               Plan - 04/27/15 1003  Clinical Impression Statement Skilled session continues to focus on sustained activation on RLE as well as RUE this session. Also focused on gait for improved postural control and increased R stance time. Note that postural control somewhat improved following session, and note that he is doing much better with carryover with gait at end of session.    Pt will benefit from skilled therapeutic intervention in order to improve on the following deficits Abnormal gait;Decreased balance;Decreased coordination;Decreased knowledge of precautions;Decreased safety awareness;Decreased strength;Difficulty walking;Impaired perceived functional ability;Impaired sensation;Impaired flexibility;Impaired tone;Impaired UE functional use;Postural dysfunction;Decreased knowledge of use of DME   Rehab Potential Excellent   Clinical Impairments Affecting Rehab Potential adhering to safety precautions   PT Frequency 2x / week   PT Duration 4 weeks   PT Treatment/Interventions ADLs/Self Care Home Management;Electrical Stimulation;Biofeedback;DME Instruction;Gait training;Stair training;Functional mobility training;Therapeutic exercise;Balance training;Neuromuscular re-education;Patient/family education;Manual techniques;Visual/perceptual remediation/compensation   PT Next Visit Plan  Gait training with new AFO w/ supination strap and then with ASO only, have pt work on donning/doffing.   R knee stability (without AFO), glute med activation (sustained), stretching heel cord (ensuring ankle in neutral, eversion stretch,  isolated hip/knee flex with arms in shoulder flexed/elbows extended position to decrease retraction on R side, RLE WB.   Consulted and Agree with Plan of Care Patient        Problem List Patient Active Problem List   Diagnosis Date Noted  . Spastic hemiplegia affecting right dominant side ([place]) 04/13/2015  . 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 Left middle cerebral artery stroke ([place]) 10/21/2014  . Right hemiparesis ([place]) 10/21/2014  . Aphasia due to stroke 10/21/2014    Cameron Sprang, PT, MPT [facility] [address] [place], [place], [address] Phone: [phone]   Fax:  [phone] 04/27/2015, 10:34 AM  Name: Mario [name] MRN: [id number] Date of Birth: [birth year]
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
## 2015-05-03 ENCOUNTER — Encounter: Payer: Self-pay | Admitting: Physical Medicine & Rehabilitation

## 2015-05-03 ENCOUNTER — Ambulatory Visit (HOSPITAL_BASED_OUTPATIENT_CLINIC_OR_DEPARTMENT_OTHER): Payer: BC Managed Care – PPO | Admitting: Physical Medicine & Rehabilitation

## 2015-05-03 VITALS — BP 132/101 | HR 72

## 2015-05-03 DIAGNOSIS — G8111 Spastic hemiplegia affecting right dominant side: Secondary | ICD-10-CM

## 2015-05-03 NOTE — Progress Notes (Signed)
   Subjective:    Patient ID: Mario [name], male    DOB: Sep 01, 1975, 39 y.o.   MRN: [id number]  HPI   Pain Inventory Average Pain 6 Pain Right Now 6 My pain is tingling  In the last 24 hours, has pain interfered with the following? General activity 8 Relation with others 8 Enjoyment of life 8 What TIME of day is your pain at its worst? All the time Sleep (in general) Good  Pain is worse with: walking Pain improves with: NA Relief from Meds: NA  Mobility walk without assistance how many minutes can you walk? unlimited ability to climb steps?  yes do you drive?  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  no  Physicians involved in your care Any changes since last visit?  no   History reviewed. No pertinent family history. Social History   Social History  . Marital Status: Married    Spouse Name: N/A  . Number of Children: N/A  . Years of Education: N/A   Social History Main Topics  . Smoking status: Never Smoker   . Smokeless tobacco: None  . Alcohol Use: No  . Drug Use: No  . Sexual Activity: Not Asked   Other Topics Concern  . None   Social History Narrative   Past Surgical History  Procedure Laterality Date  . No past surgeries     Past Medical History  Diagnosis Date  . Hypertension    BP 132/101 mmHg  Pulse 72  SpO2 98%  Opioid Risk Score:   Fall Risk Score:  `1  Depression screen PHQ 2/9  Depression screen PHQ 2/9 04/13/2015  Decreased Interest 0  Down, Depressed, Hopeless 1  PHQ - 2 Score 1  Altered sleeping 0  Tired, decreased energy 0  Change in appetite 1  Feeling bad or failure about yourself  1  Trouble concentrating 1  Moving slowly or fidgety/restless 1  Suicidal thoughts 0  PHQ-9 Score 5     Review  of Systems  Constitutional: Positive for appetite change and unexpected weight change.  Gastrointestinal: Positive for constipation.  Musculoskeletal: Positive for gait problem.  Skin: Positive for rash.  Neurological:       Tingling  Psychiatric/Behavioral: Positive for confusion.       Depression  All other systems reviewed and are negative.      Objective:   Physical Exam        Assessment & Plan:  Botox Injection for spasticity using needle EMG guidance  Dilution: 50 Units/ml Indication: Severe spasticity which interferes with ADL,mobility and/or  hygiene and is unresponsive to medication management and other conservative care Informed consent was obtained after describing risks and benefits of the procedure with the patient. This includes bleeding, bruising, infection, excessive weakness, or medication side effects. A REMS form is on file and signed. Needle: 25 g 2in needle electrode Number of units per muscle  Biceps100 FCR25 FCU25 FDS25 FDP25 Brachioradialis 25 Tib post 75 All injections were done after obtaining appropriate EMG activity and after negative drawback for blood. The patient tolerated the procedure well. Post procedure instructions were given. A followup appointment was made.

## 2015-05-03 NOTE — Patient Instructions (Signed)

## 2015-05-04 ENCOUNTER — Ambulatory Visit: Payer: BC Managed Care – PPO

## 2015-05-04 ENCOUNTER — Encounter: Payer: Self-pay | Admitting: Occupational Therapy

## 2015-05-04 ENCOUNTER — Encounter: Payer: Self-pay | Admitting: Rehabilitation

## 2015-05-04 ENCOUNTER — Ambulatory Visit: Payer: BC Managed Care – PPO | Admitting: Occupational Therapy

## 2015-05-04 ENCOUNTER — Ambulatory Visit: Payer: BC Managed Care – PPO | Admitting: Rehabilitation

## 2015-05-04 DIAGNOSIS — R201 Hypoesthesia of skin: Secondary | ICD-10-CM

## 2015-05-04 DIAGNOSIS — IMO0002 Reserved for concepts with insufficient information to code with codable children: Secondary | ICD-10-CM

## 2015-05-04 DIAGNOSIS — G8111 Spastic hemiplegia affecting right dominant side: Secondary | ICD-10-CM | POA: Diagnosis not present

## 2015-05-04 DIAGNOSIS — R482 Apraxia: Secondary | ICD-10-CM

## 2015-05-04 DIAGNOSIS — R269 Unspecified abnormalities of gait and mobility: Secondary | ICD-10-CM

## 2015-05-04 DIAGNOSIS — R2681 Unsteadiness on feet: Secondary | ICD-10-CM

## 2015-05-04 DIAGNOSIS — R4701 Aphasia: Secondary | ICD-10-CM

## 2015-05-04 DIAGNOSIS — R4189 Other symptoms and signs involving cognitive functions and awareness: Secondary | ICD-10-CM

## 2015-05-04 NOTE — Therapy (Signed)
[facility] [address] [place], [place], [address] Phone: [phone]   Fax:  [phone]  Occupational Therapy Treatment  Patient Details  Name: Mario [name] MRN: [id number] Date of Birth: [birth year] Referring Provider: Blane Ohara  Encounter Date: 05/04/2015      OT End of Session - 05/04/15 1241    Visit Number 23   Number of Visits 32   Date for OT Re-Evaluation 06/03/15   Authorization Type [employer] state health PPO no visit limit   OT Start Time 0845   OT Stop Time 0929   OT Time Calculation (min) 44 min   Activity Tolerance Patient tolerated treatment well      Past Medical History  Diagnosis Date  . Hypertension     Past Surgical History  Procedure Laterality Date  . No past surgeries      There were no vitals filed for this visit.  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    Pertinent History See epic snaphshot, L MCA CVA, HTN   Patient Stated Goals Per father, speech, RUE and walking to improve   Currently in Pain? No/denies                      OT Treatments/Exercises (OP) - 05/04/15 0001    Neurological Re-education Exercises   Other Exercises 1 Neuro re ed in functional ktichen tasks (obtaining items, cooking eggs, plating and serving eggs and clean up) with emphasis on using RUE as functionally as possible. PT needs max cueing to use RUE functionally instead of doing all tasks one handed.  Pt able to use RUE as gross assist in multiple tasks with minimal assistance. Pt needs mod facilitation to reach overhead and use R hand functionally. Pt with more spontaneous active movement in functional tasks due to sensory loss and apraxia.   Pt with improved release in R hand as well as improved isolated movement in hand. Pt also with improved ability to over ride elbow flexion with reach. Pt reports he went fishing this past weekend and used both hands together to cast rod as well as used R thumb to push release button on fishing rod.                   OT Short Term Goals - 05/04/15 1238    OT SHORT TERM GOAL #5   Status --  pt able to use as gross assist for some parts of ADL activity   OT SHORT TERM GOAL #6   Title Pt will be mod I with upgraded HEP - 05/06/2015 (dated adjusted as pt missed one week of OT)   Status Achieved   OT SHORT TERM GOAL #7   Title Pt will demonstrate ability to cast fishing pole with RUE with no more than mod assist   Status Achieved   OT SHORT TERM GOAL #8   Title Pt will be able to use RUE as stabilizer during hot meal prep   Status Achieved   OT SHORT TERM GOAL  #9   TITLE Pt will demonstrate ability to pick up and place light object at 90* of reach with RUE with min compensations.   Status On-going  OT Long Term Goals - 05/04/15 1239    OT LONG TERM GOAL #6   Title --  moved to STG on 03/30/2015   OT LONG TERM GOAL #8   Title Pt will be mod I with upgraded HEP prn - 06/03/2015 (date adjusted as pt missed one week of OT)   Status On-going   OT LONG TERM GOAL  #9   Baseline Pt will demonstrate ability to overhead reach to grasp light object off shelf with RUE with no more than min facilitation   Status On-going   OT LONG TERM GOAL  #10   TITLE Pt will demostrate ability to use RUE as gross assist for 100% of basic self care tasks.   Status On-going   OT LONG TERM GOAL  #11   TITLE Pt will be able to use RUE as gross assist during hot meal prep   Status On-going               Plan - 05/04/15 1239    Clinical Impression Statement Pt with improved isolated movement and is already realizing benefit of botox injection to RUE.  With max cueing and structure pt  is able to use RUE as gross assist in some functional tasks.    Pt will benefit from skilled therapeutic intervention in order to improve on the following deficits (Retired) Abnormal gait;Decreased balance;Decreased coordination;Decreased cognition;Decreased knowledge of use of DME;Decreased mobility;Decreased range of motion;Decreased strength;Increased edema;Impaired UE functional use;Impaired tone;Impaired sensation;Pain   Rehab Potential Good   Clinical Impairments Affecting Rehab Potential global aphasia, apraxia, impaired sensation   OT Frequency 2x / week   OT Duration 8 weeks   OT Treatment/Interventions Self-care/ADL training;Ultrasound;Moist Heat;Electrical Stimulation;DME and/or AE instruction;Neuromuscular education;Therapeutic exercise;Functional Mobility Training;Manual Therapy;Passive range of motion;Splinting;Therapeutic activities;Balance training;Patient/family education;Cognitive remediation/compensation   Plan NMR to RUE with mid to high reach, unilateral reach, functional use of RUE, grasp and release.    Consulted and Agree with Plan of Care Patient        Problem List Patient Active Problem List   Diagnosis Date Noted  . Spastic hemiplegia affecting right dominant side (HCC) 04/13/2015  . 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 Right hemiparesis (HCC) 10/21/2014  . Aphasia due to stroke 10/21/2014    [place], Mario [name], OTR/L 05/04/2015, 12:42 PM  [facility] [address] [place], [place], [address] Phone: [phone]   Fax:  [phone]  Name: Mario [name] MRN: [id number] Date of Birth: [birth year]

## 2015-05-04 NOTE — Therapy (Signed)
[facility] [address] [place], [place], [address] Phone: [phone]   Fax:  [phone]  Speech Language Pathology Treatment  Patient Details  Name: Mario [name] MRN: [id number] Date of Birth: 27-Oct-1975 No Data Recorded  Encounter Date: 05/04/2015      End of Session - 05/04/15 0915    Visit Number 24   Number of Visits 33   Date for SLP Re-Evaluation 06/04/15   SLP Start Time 0803   SLP Stop Time  0846   SLP Time Calculation (min) 43 min   Activity Tolerance Patient tolerated treatment well      Past Medical History  Diagnosis Date  . Hypertension     Past Surgical History  Procedure Laterality Date  . No past surgeries      There were no vitals filed for this visit.  Visit Diagnosis: Expressive aphasia  Receptive aphasia  Verbal apraxia      Subjective Assessment - 05/04/15 0807    Subjective unintelligible               ADULT SLP TREATMENT - 05/04/15 0808    General Information   Behavior/Cognition Alert;Cooperative;Pleasant mood   Treatment Provided   Treatment provided Cognitive-Linquistic   Pain Assessment   Pain Assessment No/denies pain   Cognitive-Linquistic Treatment   Treatment focused on Aphasia;Apraxia   Skilled Treatment SLP focused today's session on expressive verbal language by having pt make simple subject verb object sentences. SLP stressed pt's need to slow rate, talk in syllables and talk in telegraphic speech (SLP educated pt with this). Pt req'd mod-max SLP A by initiation of first phoneme of word as well as word sequence. SLP used written words with subject column, verb column, and object column with pt pointing to make correct sentence to describe picture 100% success. Perseveration noted heavily in pt's spontaneous speech even with written cues for pt to read.   Assessment / Recommendations / Plan   Plan Continue with current plan of care   Progression  Toward Goals   Progression toward goals Progressing toward goals  with SLP assistance            SLP Short Term Goals - 05/04/15 0848    SLP SHORT TERM GOAL #6   Title pt will provide functional multimodal responses (except writing) in simple conversation with occasional min A   Baseline for 1 weeks or 3 more visits, for all STGs   Time 1   Period Weeks   Status On-going   SLP SHORT TERM GOAL #7   Title pt will demo understanding of mod complex conversation with repeats allowed 90%   Baseline --   Time 1   Period Weeks   Status On-going   SLP SHORT TERM GOAL #8   Title pt will write one word responses to communicate functionally, with multiple attempts allowed, achieving 75% listener comprehension   Status Achieved          SLP Long Term Goals - 05/04/15 0839    SLP LONG TERM GOAL #1   Title pt will demo undertanding of simple/mod complex conversational questions via yes/no responses 80% accuracy   Period --  or 16 visits - for all LTGs   Status Achieved   SLP LONG TERM GOAL #2   Title pt will demo undertanding of simple conversational quesitons via yes/no responses 90% accuracy   Status Achieved   SLP LONG TERM GOAL #3   Title pt will perform  rote speech tasks simultaneously with SLP with 40% accuracy (functional responses allowed)   Status Not Met   SLP LONG TERM GOAL #4   Title pt will imitate salient words/family names with 20% success   Status Achieved   SLP LONG TERM GOAL #5   Title pt will demo use of simple alternative/augmentative communcation and/or multimodal communication with 80% success in mod complex message conveyance   Baseline 5 weeks or 9 more visits   Time 5   Period Weeks   Status Revised   SLP LONG TERM GOAL #6   Title pt will state family names/information and other salient personal 2-3 word information with 80% success   Baseline 5 weeks or 9 more visits   Time 5   Period Weeks   Status On-going          Plan - 05/04/15 0915     Clinical Impression Statement In session today pt pointed at subject verb and object to communicate simple desciription of pictures. Pt req'd A from SLP consistently to spontaneously produce simple sentences. Given today's findings, pt may benefit from simple word/picture device or board to enhance expressive communication. In spontaneous speech pt did not exhibit reduced rate or breaking down speech into syllables. Skilled ST needed to cont to maximize expressive and receptive language skills however SLP wonders if pt's rehab potential is slowing.   Speech Therapy Frequency 2x / week   Duration --  5 weeks or 11 more visits   Treatment/Interventions SLP instruction and feedback;Compensatory strategies;Internal/external aids;Environmental controls;Patient/family education;Functional tasks;Cueing hierarchy;Multimodal communcation approach   Potential to Achieve Goals Fair   Potential Considerations Severity of impairments   Consulted and Agree with Plan of Care Patient        Problem List Patient Active Problem List   Diagnosis Date Noted  . Spastic hemiplegia affecting right dominant side ([place]) 04/13/2015  . 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 Left middle cerebral artery stroke ([place]) 10/21/2014  . Right hemiparesis ([place]) 10/21/2014  . Aphasia due to stroke 10/21/2014    [facility] , MS, CCC-SLP  05/04/2015, 5:25 PM  [facility] [address] [place], [place], [address] Phone: [phone]   Fax:  [phone]   Name: GARETH FITZNER MRN: [id number] Date of Birth: 1976-05-19

## 2015-05-04 NOTE — Patient Instructions (Addendum)
Hamstrings    Lie on stomach. Bend right knee as far as you can, pointing toes toward knee. Do not bend hips!!! Make sure that your hips stay down!!! Use full length mirror if you can so that you can see what your hips are doing.   Hold __2-3_ seconds. Repeat _10___ times. Do __2__ sessions per day. CAUTION: Move slowly.  Copyright  VHI. All rights reserved.    Bridging (Single Leg)    Lie on back with feet shoulder width apart and left leg straight. Lift hips toward the ceiling while keeping leg straight. Hold __2__ seconds. Repeat _10___ times. Do ___2_ sessions per day. Dad you may have to keep R ankle stable while he does these to prevent ankle from rolling over.   http://gt2.exer.us/358   Copyright  VHI. All rights reserved.      These exercises are called 4 way hip exercises. Attach band to something sturdy like bed post or sturdy dinning room table. Make sure that it won't move and that you have something to hold onto with your left arm. You will essentially turn in all four direction, see below. Make sure you stand tall and keep R knee "soft" (bent slightly) when moving L leg and keep leg that is in band straight and off of ground (don't touch down before returning to starting position). Perform all directions 10 reps both legs. Once a day.   Balance, Proprioception: Hip Flexion With Tubing   With tubing attached to ankle of uninvolved leg, swing leg forward. Return.  http://cc.exer.us/18   Copyright  VHI. All rights reserved.   EXTENSION: Standing - Resistance Band (Active)   Stand, both feet flat. Against yellow resistance band, draw right leg behind body as far as possible.  http://gtsc.exer.us/82   Copyright  VHI. All rights reserved.   (Clinic) Balance: With Hip Abduction - Unilateral Stance (Resist)   Opposite side toward pulley, strap around left ankle, leg in. Swing leg across body and out to side. Use arm support only as needed for  balance.    Copyright  VHI. All rights reserved.   (Clinic) Balance: With Hip Adduction - Unilateral Stance (Resist)   Left side toward pulley, strap around ankle, foot out to side. Swing foot across body and beyond. Use arm support for balance.   Copyright  VHI. All rights reserved.     Gastroc / Heel Cord Stretch - Seated With Towel   Sit on floor, towel around ball of foot. Gently pull foot in toward body, stretching heel cord and calf. Hold for 60__ seconds. Repeat on involved leg. Repeat __2_ times. Do _2__ times per day. Dad will need to assist on outer part of belt to make sure that foot is straight. Hamstring Curl: Resisted (Prone)   Anchor behind, with tubing on left ankle, leg straight, bend knee. Repeat _15__ times per set. Do _1___ sets per session. Do __2__ sessions per day. Really make sure that you are keeping your R hip on bed. You do not have to use a band to do this yet. Functional Quadriceps: Chair Squat   Keeping feet flat on floor, shoulder width apart, squat as low as is comfortable. Use support as necessary. Repeat _15___ times per set. Do _1___ sets per session. Do __2__ sessions per day. Can use a wall length mirror to be able to look at your R knee to make sure its not locking out. Make sure both legs are moving at the same time. Make sure knees do  not go over toes, so pretend you are sitting in a chair behind you.   http://orth.exer.us/736

## 2015-05-04 NOTE — Therapy (Signed)
[facility] [address] [place], [place], [address] Phone: [phone]   Fax:  [phone]  Physical Therapy Treatment  Patient Details  Name: Mario [name] MRN: [id number] Date of Birth: 31-Dec-1975 No Data Recorded  Encounter Date: 05/04/2015      PT End of Session - 05/04/15 0933    Visit Number 21   Number of Visits 25   Date for PT Re-Evaluation 05/15/15   PT Start Time 0930   PT Stop Time 1017   PT Time Calculation (min) 47 min   Equipment Utilized During Treatment Gait belt   Activity Tolerance Patient tolerated treatment well   Behavior During Therapy [facility] for tasks assessed/performed      Past Medical History  Diagnosis Date  . Hypertension     Past Surgical History  Procedure Laterality Date  . No past surgeries      There were no vitals filed for this visit.  Visit Diagnosis:  Abnormality of gait  Unsteadiness  Weakness due to cerebrovascular accident      Subjective Assessment - 05/04/15 0932    Subjective No falls since last visit, no changes reported.  Note that he did get Botox injections yesterday.  Reports that they hurt, but are okay now.    Limitations Walking;House hold activities;Reading;Standing;Writing   Patient Stated Goals Improve speech, be able to walk better, and get functional use of RUE.    Currently in Pain? No/denies               NMR:  Continue to address sustained activation of RLE/UE with WB and reaching tasks.  Had pts LLE on unstable support surface to decrease use during activity while PT assisted with RUE grasp of cup/cone shapes and placing high to the R upper cabinet shelf.  Also provided assist for upward reach of RUE with tactile and verbal cues for opening grasp, elbow extension as well as cues for decreased use of LUE.  Also provided cues for increased R lateral weight shift and "push" through RLE to reach to shelf.  Tolerated well.  Progressed to  standing isolated knee flex then hip flex to carry over to improved gait pattern.  Pt continues to require increased tactile cues at hips for decreased hip and trunk compensations, but note marked improvement from last week.  Then performed in prone and added to HEP, see below.  Ended with gait x 230' with R ASO only to assess ankle stability as well as improved DF assist to clear LE and isolated hip/knee flex.  Continues to require tactile cues to improve R knee control, but pt able to increase amount of hip/knee flexion slightly for improved R foot clearance.  Cues for posture and slower L step length to increase R LE WB.    Therex:  Performed prone R knee flex in order to better isolate movement to carry over to gait.  Requires use of mirror for improved visual feedback for decreased hip activation and tapping facilitation at R hamstrings for improved activation.  Provided this for HEP with cues to utilize free standing mirror if possible to ensure decreased hip compensation.  Also printed full HEP on single packet to decrease                   PT Education - 05/04/15 1024    Education provided Yes   Education Details Education on addition to HEP with use of mirror, also compliance with HEP.  Person(s) Educated Patient   Methods Explanation;Handout   Comprehension Verbalized understanding          PT Short Term Goals - 03/04/15 1035    PT SHORT TERM GOAL #1   Title Pt will improve BERG balance score to 56/56 to decrease fall risk and improve functional mobility. (Target 03/03/15)   Baseline 55/56 on 03/04/15   Status Not Met   PT SHORT TERM GOAL #2   Title Pt will improve gait speed to 2.83 ft/sec to improve functional ambulation level to community ambulator.   (Target 03/03/15)   Baseline 2.00 ft/sec on 03/04/15, feel that this is due to increased tone.    Status Not Met   PT SHORT TERM GOAL #3   Title Pt will ambulate >500' on indoor and outdoor surfaces including grass,  inclines and curb step without AD with AFO at S level without overt LOB to indicate safe negotiation of indoor and outdoor surfaces.  (Target 03/03/15)   Baseline met on 03/03/15, however recommend use of cane at home for increased safety and to decrease tone with gait.     Status Achieved   PT SHORT TERM GOAL #4   Title Pt will negotiate up/down 4 steps in step to pattern with single rail at mod I level with safe technique to traverse community obstacles.   (Target 03/03/15)   Baseline Met on 9/22.   Status Achieved   PT SHORT TERM GOAL #5   Title Pt will improve 3MWT to 498' to improve ambulation function and functional endurance.    Baseline 366' on 03/03/15   Status Not Met           PT Long Term Goals - 05/04/15 1027    PT LONG TERM GOAL #1   Title Pts SIS mobility score will improve to 80% to indicate improved perceived functional mobility.  (Modified Target 05/15/15)  Target dates updated due to missing 2 wks of PT   Baseline 66.7% on 8/31   Time 8   Period Weeks   Status On-going   PT LONG TERM GOAL #2   Title Pt will improve ankle DF strength to 3/5 in order to increase safety with household ambulation and begin to address ambulation without use of AFO.  (Modified Target 05/15/15)   Baseline 3-/5 on 04/14/15   Time 8   Period Weeks   Status On-going   PT LONG TERM GOAL #3   Title Pt will verbalize ability to return to leisure activities to indicate return to community function.   (Modified Target 05/15/15)   Baseline met 05/04/15 (went fishing over weekend with father)   Time 8   Period Weeks   Status Achieved   PT LONG TERM GOAL #4   Title Pt will negotiate up/down 4 steps without rail in alternating fashion to traverse community stairs while carrying object.  (Modified Target 05/15/15)   Baseline Met 11/9.   Time 8   Period Weeks   Status Achieved   PT LONG TERM GOAL #5   Title Pt will demonstrate ability to ambulate up to 150' with decreased R genu recurvatum to  improve R knee stability/strength and prevent knee injury.  (Target Date: 04/16/15)   Baseline met 04/14/15   Time 8   Period Weeks   Status Achieved   PT LONG TERM GOAL #6   Title Pt will demonstrate ability to perform SLS x 5 secs on RLE to improve balance and awareness of RLE. (Modified Target Date: 05/15/15)  Time 8   Period Weeks   Status On-going               Plan - 05/04/15 0933    Clinical Impression Statement Skilled session continues to focus on sustained activation of RLE as well as RUE during session.  Also continued to focus on isolated hip and knee flex with exercise as well as gait with ASO only to assess carry over and improvement noted in tone from Botox injections.    Pt will benefit from skilled therapeutic intervention in order to improve on the following deficits Abnormal gait;Decreased balance;Decreased coordination;Decreased knowledge of precautions;Decreased safety awareness;Decreased strength;Difficulty walking;Impaired perceived functional ability;Impaired sensation;Impaired flexibility;Impaired tone;Impaired UE functional use;Postural dysfunction;Decreased knowledge of use of DME   Rehab Potential Excellent   Clinical Impairments Affecting Rehab Potential adhering to safety precautions   PT Frequency 2x / week   PT Duration 4 weeks   PT Treatment/Interventions ADLs/Self Care Home Management;Electrical Stimulation;Biofeedback;DME Instruction;Gait training;Stair training;Functional mobility training;Therapeutic exercise;Balance training;Neuromuscular re-education;Patient/family education;Manual techniques;Visual/perceptual remediation/compensation   PT Next Visit Plan Gait with ASO only vs no brace at all.   R knee stability (without AFO), glute med activation (sustained), stretching heel cord (ensuring ankle in neutral, eversion stretch, isolated hip/knee flex, RLE WB, functional upward reach with RUE for trunk elongation and RLE activation.   Consulted and  Agree with Plan of Care Patient        Problem List Patient Active Problem List   Diagnosis Date Noted  . Spastic hemiplegia affecting right dominant side ([place]) 04/13/2015  . 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 Left middle cerebral artery stroke ([place]) 10/21/2014  . Right hemiparesis ([place]) 10/21/2014  . Aphasia due to stroke 10/21/2014    Cameron Sprang, PT, MPT [facility] [address] [place], [place], [address] Phone: [phone]   Fax:  [phone] 05/04/2015, 10:30 AM  Name: Mario [name] MRN: [id number] Date of Birth: [birth year]

## 2015-05-06 ENCOUNTER — Ambulatory Visit: Payer: BC Managed Care – PPO

## 2015-05-06 ENCOUNTER — Encounter: Payer: Self-pay | Admitting: Physical Therapy

## 2015-05-06 ENCOUNTER — Encounter: Payer: Self-pay | Admitting: Occupational Therapy

## 2015-05-06 ENCOUNTER — Ambulatory Visit: Payer: BC Managed Care – PPO | Admitting: Physical Therapy

## 2015-05-06 ENCOUNTER — Ambulatory Visit: Payer: BC Managed Care – PPO | Attending: Family Medicine | Admitting: Occupational Therapy

## 2015-05-06 DIAGNOSIS — R4189 Other symptoms and signs involving cognitive functions and awareness: Secondary | ICD-10-CM

## 2015-05-06 DIAGNOSIS — R531 Weakness: Secondary | ICD-10-CM | POA: Diagnosis present

## 2015-05-06 DIAGNOSIS — R269 Unspecified abnormalities of gait and mobility: Secondary | ICD-10-CM | POA: Diagnosis present

## 2015-05-06 DIAGNOSIS — G8111 Spastic hemiplegia affecting right dominant side: Secondary | ICD-10-CM | POA: Diagnosis present

## 2015-05-06 DIAGNOSIS — R4701 Aphasia: Secondary | ICD-10-CM

## 2015-05-06 DIAGNOSIS — R201 Hypoesthesia of skin: Secondary | ICD-10-CM

## 2015-05-06 DIAGNOSIS — Z8673 Personal history of transient ischemic attack (TIA), and cerebral infarction without residual deficits: Secondary | ICD-10-CM | POA: Diagnosis present

## 2015-05-06 DIAGNOSIS — R2681 Unsteadiness on feet: Secondary | ICD-10-CM

## 2015-05-06 DIAGNOSIS — M6281 Muscle weakness (generalized): Secondary | ICD-10-CM

## 2015-05-06 DIAGNOSIS — IMO0002 Reserved for concepts with insufficient information to code with codable children: Secondary | ICD-10-CM

## 2015-05-06 DIAGNOSIS — R482 Apraxia: Secondary | ICD-10-CM

## 2015-05-06 DIAGNOSIS — I69359 Hemiplegia and hemiparesis following cerebral infarction affecting unspecified side: Secondary | ICD-10-CM | POA: Insufficient documentation

## 2015-05-06 DIAGNOSIS — I69898 Other sequelae of other cerebrovascular disease: Secondary | ICD-10-CM | POA: Insufficient documentation
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
## 2015-05-06 NOTE — Therapy (Signed)
[facility] [address] [place], [place], [address] Phone: [phone]   Fax:  [phone]  Speech Language Pathology Treatment  Patient Details  Name: Mario [name] MRN: [id number] Date of Birth: Jul 12, 1975 No Data Recorded  Encounter Date: 05/06/2015      End of Session - 05/06/15 0949    Visit Number 25   Number of Visits 33   Date for SLP Re-Evaluation 06/04/15   SLP Start Time 0803   SLP Stop Time  0845   SLP Time Calculation (min) 42 min   Activity Tolerance Patient tolerated treatment well      Past Medical History  Diagnosis Date  . Hypertension     Past Surgical History  Procedure Laterality Date  . No past surgeries      There were no vitals filed for this visit.  Visit Diagnosis: Expressive aphasia  Receptive aphasia  Verbal apraxia      Subjective Assessment - 05/06/15 0808    Subjective unintelligible               ADULT SLP TREATMENT - 05/06/15 0809    General Information   Behavior/Cognition Alert;Cooperative;Pleasant mood   Treatment Provided   Treatment provided Cognitive-Linquistic   Pain Assessment   Pain Assessment No/denies pain   Cognitive-Linquistic Treatment   Treatment focused on Apraxia;Aphasia   Skilled Treatment Verbal expression targeted with patient today by having him produce simple sentences with subject, helping verb, and verb. With written cues 80% of each sentence provided SLP needed to provide min-mod cues for verb at end of sentence consistently, and provide mod cues for subject (f:4) usually. Perseverative speech noted approx 85% of the time. Pt wrote name and address    Assessment / Recommendations / Plan   Plan Continue with current plan of care   Progression Toward Goals   Progression toward goals --  cont to work towards goals            SLP Short Term Goals - 05/06/15 1022    SLP SHORT TERM GOAL #6   Title pt will provide  functional multimodal responses (except writing) in simple conversation with occasional min A   Baseline for 1 weeks or 3 more visits, for all STGs   Time 1   Period Weeks   Status Not Met   SLP SHORT TERM GOAL #7   Title pt will demo understanding of mod complex conversation with repeats allowed 90%   Time 1   Period Weeks   Status Not Met   SLP SHORT TERM GOAL #8   Title pt will write one word responses to communicate functionally, with multiple attempts allowed, achieving 75% listener comprehension   Status Achieved          SLP Long Term Goals - 05/06/15 0830    SLP LONG TERM GOAL #1   Title pt will demo undertanding of simple/mod complex conversational questions via yes/no responses 80% accuracy   Period --  or 16 visits - for all LTGs   Status Achieved   SLP LONG TERM GOAL #2   Title pt will demo undertanding of simple conversational quesitons via yes/no responses 90% accuracy   Status Achieved   SLP LONG TERM GOAL #3   Title pt will perform rote speech tasks simultaneously with SLP with 40% accuracy (functional responses allowed)   Status Not Met   SLP LONG TERM GOAL #4   Title pt will imitate salient words/family names with  20% success   Status Achieved   SLP LONG TERM GOAL #5   Title pt will demo use of simple alternative/augmentative communcation and/or multimodal communication with 80% success in mod complex message conveyance   Baseline 5 weeks or 8 more visits   Time 5   Period Weeks   Status Revised   SLP LONG TERM GOAL #6   Title pt will state family names/information and other salient personal 2-3 word information with 80% success   Baseline 5 weeks or 8 more visits   Time 5   Period Weeks   Status On-going          Plan - 05/06/15 0949    Clinical Impression Statement Pt req'd consistent cues in simple verbal tasks. His attempts at writing address req'd pt to copy street name and city name.    Speech Therapy Frequency 2x / week   Duration --  5  weeks or 8 visits   Treatment/Interventions SLP instruction and feedback;Compensatory strategies;Internal/external aids;Environmental controls;Patient/family education;Functional tasks;Cueing hierarchy;Multimodal communcation approach        Problem List Patient Active Problem List   Diagnosis Date Noted  . Spastic hemiplegia affecting right dominant side ([place]) 04/13/2015  . 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 Left middle cerebral artery stroke ([place]) 10/21/2014  . Right hemiparesis ([place]) 10/21/2014  . Aphasia due to stroke 10/21/2014    [facility] , MS, CCC-SLP  05/06/2015, 12:16 PM  [facility] [address] [place], [place], [address] Phone: [phone]   Fax:  [phone]   Name: Mario [name] MRN: [id number] Date of Birth: 1976/01/22

## 2015-05-06 NOTE — Therapy (Signed)
[facility] [address] [place], [place], [address] Phone: [phone]   Fax:  [phone]  Physical Therapy Treatment  Patient Details  Name: Mario [name] MRN: [id number] Date of Birth: 1975-07-30 No Data Recorded  Encounter Date: 05/06/2015      PT End of Session - 05/06/15 1649    Visit Number 22   Number of Visits 25   PT Start Time 0931   PT Stop Time 1016   PT Time Calculation (min) 45 min   Equipment Utilized During Treatment Gait belt   Activity Tolerance Patient tolerated treatment well   Behavior During Therapy [facility] for tasks assessed/performed      Past Medical History  Diagnosis Date  . Hypertension     Past Surgical History  Procedure Laterality Date  . No past surgeries      There were no vitals filed for this visit.  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  Note that he did get Botox injections Monday.   Patient Stated Goals Improve speech, be able to walk better, and get functional use of RUE.    Currently in Pain? No/denies   Multiple Pain Sites No           [facility] Adult PT Treatment/Exercise - 05/06/15 1637    Ambulation/Gait   Ambulation/Gait Yes   Ambulation/Gait Assistance 5: Supervision;6: Modified independent (Device/Increase time)   Ambulation/Gait Assistance Details Pt. ambulated 115' x 2 with no AD without AFO; pt. demo'd good R foot clearance with initial gait trial and good  hip/knee flexion timing following NMES treatment; as gait trails continued and pt. fatigued, quality of R hip/knee flexion timing decreased along with R foot clearance; pt. demo'd R ankle instability with passive inversion in swing phase through gait trials; verbal cues provided to decrease R ankle inversion and increase R foot clearance.     Ambulation Distance (Feet) 115 Feet   Assistive device None   Gait Pattern Decreased arm swing - right;Step-through pattern;Decreased stance time - right;Decreased dorsiflexion - right;Decreased weight shift to right;Right genu recurvatum;Poor foot clearance - right;Right foot flat   Stairs No   Ramp Not tested (comment)   Curb Not tested (comment)   Electrical Stimulation   Electrical Stimulation Location hamstrings handwidth apart    Electrical Stimulation Action active R knee flexion    Electrical Stimulation Parameters portable empi unit, atrophy / large / muscle / 20 min / intensity to patient tolerance   Electrical Stimulation Goals Tone;Strength;Neuromuscular facilitation           PT Short Term Goals - 03/04/15 1035    PT SHORT TERM GOAL #1   Title Pt will improve BERG balance score to 56/56 to decrease fall risk and improve functional mobility. (Target 03/03/15)   Baseline 55/56 on 03/04/15   Status Not Met   PT SHORT TERM GOAL #2   Title Pt will improve gait speed to 2.83 ft/sec to improve functional ambulation level to community ambulator.   (Target 03/03/15)   Baseline 2.00 ft/sec  on 03/04/15, feel that this is due to increased tone.    Status Not Met   PT SHORT TERM GOAL #3   Title Pt will ambulate >500' on indoor and outdoor surfaces including grass, inclines and curb step without AD with AFO at S level without overt LOB to indicate safe negotiation of indoor and outdoor surfaces.  (Target 03/03/15)   Baseline met on 03/03/15, however recommend use of cane at home for increased safety and to decrease tone with gait.     Status Achieved    PT SHORT TERM GOAL #4   Title Pt will negotiate up/down 4 steps in step to pattern with single rail at mod I level with safe technique to traverse community obstacles.   (Target 03/03/15)   Baseline Met on 9/22.   Status Achieved   PT SHORT TERM GOAL #5   Title Pt will improve 3MWT to 498' to improve ambulation function and functional endurance.    Baseline 366' on 03/03/15   Status Not Met           PT Long Term Goals - 05/04/15 1027    PT LONG TERM GOAL #1   Title Pts SIS mobility score will improve to 80% to indicate improved perceived functional mobility.  (Modified Target 05/15/15)  Target dates updated due to missing 2 wks of PT   Baseline 66.7% on 8/31   Time 8   Period Weeks   Status On-going   PT LONG TERM GOAL #2   Title Pt will improve ankle DF strength to 3/5 in order to increase safety with household ambulation and begin to address ambulation without use of AFO.  (Modified Target 05/15/15)   Baseline 3-/5 on 04/14/15   Time 8   Period Weeks   Status On-going   PT LONG TERM GOAL #3   Title Pt will verbalize ability to return to leisure activities to indicate return to community function.   (Modified Target 05/15/15)   Baseline met 05/04/15 (went fishing over weekend with father)   Time 8   Period Weeks   Status Achieved   PT LONG TERM GOAL #4   Title Pt will negotiate up/down 4 steps without rail in alternating fashion to traverse community stairs while carrying object.  (Modified Target 05/15/15)   Baseline Met 11/9.   Time 8   Period Weeks   Status Achieved   PT LONG TERM GOAL #5   Title Pt will demonstrate ability to ambulate up to 150' with decreased R genu recurvatum to improve R knee stability/strength and prevent knee injury.  (Target Date: 04/16/15)   Baseline met 04/14/15   Time 8   Period Weeks   Status Achieved   PT LONG TERM GOAL #6   Title Pt will demonstrate ability to perform SLS x 5 secs on RLE to improve balance and awareness of RLE. (Modified  Target Date: 05/15/15)   Time 8   Period Weeks   Status On-going            Plan - 05/06/15 1650    Clinical Impression Statement skilled session focused on increasing timing and coordination of R hip/knee flexion for safe ambulation with R LE clearance.  Pt. tolerated E-stim to R hamstring well showing improved R LE performance in following gait trails.  Pt. progressing toward goals.     Pt will benefit from skilled therapeutic intervention in order to improve on the following deficits Abnormal gait;Decreased balance;Decreased coordination;Decreased knowledge of precautions;Decreased safety awareness;Decreased strength;Difficulty  walking;Impaired perceived functional ability;Impaired sensation;Impaired flexibility;Impaired tone;Impaired UE functional use;Postural dysfunction;Decreased knowledge of use of DME   Rehab Potential Excellent   Clinical Impairments Affecting Rehab Potential adhering to safety precautions   PT Frequency 2x / week   PT Duration 4 weeks   PT Treatment/Interventions ADLs/Self Care Home Management;Electrical Stimulation;Biofeedback;DME Instruction;Gait training;Stair training;Functional mobility training;Therapeutic exercise;Balance training;Neuromuscular re-education;Patient/family education;Manual techniques;Visual/perceptual remediation/compensation   PT Next Visit Plan R knee stability, stretching heel cord, isolated hip/knee flex, RLE WB, RLE activation.  Continue progressing toward goals. Assess LTGs next week.   Consulted and Agree with Plan of Care Patient        Problem List Patient Active Problem List   Diagnosis Date Noted  . Spastic hemiplegia affecting right dominant side ([place]) 04/13/2015  . 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 Left middle cerebral artery stroke ([place]) 10/21/2014  . Right hemiparesis ([place]) 10/21/2014  . Aphasia due to stroke 10/21/2014    Bess Harvest 05/07/2015, 8:31 AM  Bess Harvest, [place]  Name:  Mario [name] MRN: [id number] Date of Birth: [birth year]  This note has been reviewed and edited by supervising CI.  Willow Ora, PTA, [facility] [address] [place], [place] [address] [phone] 05/07/2015, 9:54 AM

## 2015-05-11 ENCOUNTER — Ambulatory Visit: Payer: BC Managed Care – PPO | Admitting: Rehabilitation

## 2015-05-11 ENCOUNTER — Encounter: Payer: Self-pay | Admitting: Occupational Therapy

## 2015-05-11 ENCOUNTER — Ambulatory Visit: Payer: BC Managed Care – PPO | Admitting: Occupational Therapy

## 2015-05-11 ENCOUNTER — Ambulatory Visit: Payer: BC Managed Care – PPO

## 2015-05-11 ENCOUNTER — Encounter: Payer: Self-pay | Admitting: Rehabilitation

## 2015-05-11 DIAGNOSIS — IMO0002 Reserved for concepts with insufficient information to code with codable children: Secondary | ICD-10-CM

## 2015-05-11 DIAGNOSIS — R482 Apraxia: Secondary | ICD-10-CM

## 2015-05-11 DIAGNOSIS — G8111 Spastic hemiplegia affecting right dominant side: Secondary | ICD-10-CM

## 2015-05-11 DIAGNOSIS — M6281 Muscle weakness (generalized): Secondary | ICD-10-CM

## 2015-05-11 DIAGNOSIS — R269 Unspecified abnormalities of gait and mobility: Secondary | ICD-10-CM

## 2015-05-11 DIAGNOSIS — R4189 Other symptoms and signs involving cognitive functions and awareness: Secondary | ICD-10-CM

## 2015-05-11 DIAGNOSIS — R2681 Unsteadiness on feet: Secondary | ICD-10-CM

## 2015-05-11 DIAGNOSIS — R4701 Aphasia: Secondary | ICD-10-CM

## 2015-05-11 NOTE — Patient Instructions (Signed)
  Please complete the assigned speech therapy homework and return it to your next session.  

## 2015-05-11 NOTE — Therapy (Signed)
[facility] [address] [place], [place], [address] Phone: [phone]   Fax:  [phone]  Speech Language Pathology Treatment  Patient Details  Name: Mario [name] MRN: [id number] Date of Birth: 1976-02-28 No Data Recorded  Encounter Date: 05/11/2015      End of Session - 05/11/15 8676    Visit Number 26   Number of Visits 33   Date for SLP Re-Evaluation 06/04/15   SLP Start Time 0803   SLP Stop Time  0846   SLP Time Calculation (min) 43 min   Activity Tolerance Patient tolerated treatment well      Past Medical History  Diagnosis Date  . Hypertension     Past Surgical History  Procedure Laterality Date  . No past surgeries      There were no vitals filed for this visit.  Visit Diagnosis: Expressive aphasia  Receptive aphasia  Verbal apraxia      Subjective Assessment - 05/11/15 0809    Subjective "Hey."               ADULT SLP TREATMENT - 05/11/15 0809    General Information   Behavior/Cognition Alert;Cooperative;Pleasant mood   Treatment Provided   Treatment provided Cognitive-Linquistic   Pain Assessment   Pain Assessment No/denies pain   Cognitive-Linquistic Treatment   Treatment focused on Apraxia;Aphasia   Skilled Treatment SLP began session by cueing pt in two different ways to use compensations. Pt expressed simple pic description (subject verb object) with consistent (90%) min A initially, fading to usual (70%) min A by the twelfth picture. Pt did not use compensations/multimodal communication unless asked to do so: minimal to little success with writing - the only compensation spontaneously used. SLP cued him to use drawing which was slightly better in conveying message. When given hypothetical situations pt responded verbally with <50% success. SLP again needed to cue pt to use compensations with same success as with picture description.   Assessment / Recommendations / Plan    Plan Continue with current plan of care          SLP Education - 05/11/15 [phone]    Education provided Yes   Education Details use of compensations   Person(s) Educated Patient   Methods Explanation   Comprehension Verbalized understanding          SLP Short Term Goals - 05/11/15 0911    SLP SHORT TERM GOAL #6   Title pt will provide functional multimodal responses (except writing) in simple conversation with occasional min A   Baseline for 1 weeks or 3 more visits, for all STGs   Time 1   Period Weeks   Status Not Met   SLP SHORT TERM GOAL #7   Title pt will demo understanding of mod complex conversation with repeats allowed 90%   Time 1   Period Weeks   Status Not Met   SLP SHORT TERM GOAL #8   Title pt will write one word responses to communicate functionally, with multiple attempts allowed, achieving 75% listener comprehension   Status Achieved          SLP Long Term Goals - 05/11/15 0932    SLP LONG TERM GOAL #1   Title pt will demo undertanding of simple/mod complex conversational questions via yes/no responses 80% accuracy   Period --  or 16 visits - for all LTGs   Status Achieved   SLP LONG TERM GOAL #2   Title pt will demo undertanding  of simple conversational quesitons via yes/no responses 90% accuracy   Status Achieved   SLP LONG TERM GOAL #3   Title pt will perform rote speech tasks simultaneously with SLP with 40% accuracy (functional responses allowed)   Status Not Met   SLP LONG TERM GOAL #4   Title pt will imitate salient words/family names with 20% success   Status Achieved   SLP LONG TERM GOAL #5   Title pt will demo use of simple alternative/augmentative communcation and/or multimodal communication with 80% success in mod complex message conveyance   Baseline 5 weeks or 8 more visits   Time 5   Period Weeks   Status Revised   SLP LONG TERM GOAL #6   Title pt will state family names/information and other salient personal 2-3 word  information with 80% success   Baseline 5 weeks or 8 more visits   Time 5   Period Weeks   Status On-going          Plan - 05/11/15 0904    Clinical Impression Statement Pt req'd consistent cues in simple verbal tasks. Pt cont to refrain from using compensations unless asked, during therapy.    Speech Therapy Frequency 2x / week   Duration 4 weeks  oy 7 visits   Treatment/Interventions SLP instruction and feedback;Compensatory strategies;Internal/external aids;Environmental controls;Patient/family education;Functional tasks;Cueing hierarchy;Multimodal communcation approach   Potential to Achieve Goals Fair   Potential Considerations Severity of impairments   Consulted and Agree with Plan of Care Patient        Problem List Patient Active Problem List   Diagnosis Date Noted  . Spastic hemiplegia affecting right dominant side ([place]) 04/13/2015  . 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 Left middle cerebral artery stroke ([place]) 10/21/2014  . Right hemiparesis ([place]) 10/21/2014  . Aphasia due to stroke 10/21/2014    [facility] , MS, CCC-SLP  05/11/2015, 9:33 AM  [facility] [address] [place], [place], [address] Phone: [phone]   Fax:  [phone]   Name: ULYSESS WITZ MRN: [id number] Date of Birth: 22-Nov-1975

## 2015-05-11 NOTE — Therapy (Signed)
[facility] [address] [place], [place], [address] Phone: [phone]   Fax:  [phone]  Occupational Therapy Treatment  Patient Details  Name: Mario [name] MRN: [id number] Date of Birth: [birth year] Referring Provider: Rochel Brome  Encounter Date: 05/11/2015      OT End of Session - 05/11/15 1328    Visit Number 25   Number of Visits 32   Date for OT Re-Evaluation 06/03/15   Authorization Type [employer] state health PPO no visit limit   OT Start Time 0847   OT Stop Time 0930   OT Time Calculation (min) 43 min   Activity Tolerance Patient tolerated treatment well      Past Medical History  Diagnosis Date  . Hypertension     Past Surgical History  Procedure Laterality Date  . No past surgeries      There were no vitals filed for this visit.  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 Pt to practice casting in back yard instead.   Pertinent History See epic snaphshot, L MCA CVA, HTN   Patient Stated Goals Per father, speech, RUE and walking to improve   Currently in Pain? No/denies                      OT Treatments/Exercises (OP) - 05/11/15 0001    Neurological Re-education Exercises   Other Exercises 1 Neuro re ed to address mid to high level reach. Intiitally addressed in sitting with closed chain bilateral reach activity then advanced to standing with emphasis on increasing weight bearing on RLE and unilateral reach. Pt able to pick up and place light weight object and then release in cabinet at 95* with min compensations and vc's. Pt able to reach to 100* with mod compensations. Session also focused on use of estim with functional unilateral reach to address  opening of hand to grasp and then release. Pt with improvement following estim and practice. Faciliated pt using RUE to don and doff jacket as well.                   OT Short Term Goals - 05/11/15 1325    OT SHORT TERM GOAL #5   Status --  pt able to use as gross assist for some parts of ADL activity   OT SHORT TERM GOAL #6   Title Pt will be mod I with upgraded HEP - 05/06/2015 (dated adjusted as pt missed one week of OT)   Status Achieved   OT SHORT TERM GOAL #7   Title Pt will demonstrate ability to cast fishing pole with RUE with no more than mod assist   Status Achieved   OT SHORT TERM GOAL #8   Title Pt will be able to use RUE as stabilizer during hot meal prep   Status Achieved   OT SHORT TERM GOAL  #9   TITLE Pt will demonstrate ability to pick up and place light object at 90* of reach with RUE with min compensations.   Status Achieved           OT Long Term Goals - 05/11/15 1325    OT LONG TERM GOAL #6   Title --  moved  to STG on 03/30/2015   OT LONG TERM GOAL #8   Title Pt will be mod I with upgraded HEP prn - 06/03/2015 (date adjusted as pt missed one week of OT)   Status On-going   OT LONG TERM GOAL  #9   Baseline Pt will demonstrate ability to overhead reach to grasp light object off shelf with RUE with no more than min facilitation   Status On-going   OT LONG TERM GOAL  #10   TITLE Pt will demostrate ability to use RUE as gross assist for 100% of basic self care tasks.   Status On-going   OT LONG TERM GOAL  #11   TITLE Pt will be able to use RUE as gross assist during hot meal prep   Status On-going               Plan - 05/11/15 1326    Clinical Impression Statement Pt has met all STG's and is improving in  grasp and release with low to mid reach. Heavy focus on using RUE in functional tasks.   Pt will benefit from skilled therapeutic intervention in order to improve on the following deficits (Retired) Abnormal gait;Decreased  balance;Decreased coordination;Decreased cognition;Decreased knowledge of use of DME;Decreased mobility;Decreased range of motion;Decreased strength;Increased edema;Impaired UE functional use;Impaired tone;Impaired sensation;Pain   Rehab Potential Good   Clinical Impairments Affecting Rehab Potential global aphasia, apraxia, impaired sensation   OT Frequency 2x / week   OT Duration 8 weeks   OT Treatment/Interventions Self-care/ADL training;Ultrasound;Moist Heat;Electrical Stimulation;DME and/or AE instruction;Neuromuscular education;Therapeutic exercise;Functional Mobility Training;Manual Therapy;Passive range of motion;Splinting;Therapeutic activities;Balance training;Patient/family education;Cognitive remediation/compensation   Plan NMR to RUE with mid to high reach, unilateal rach, functional use of RUE, grasp and release   Consulted and Agree with Plan of Care Patient        Problem List Patient Active Problem List   Diagnosis Date Noted  . Spastic hemiplegia affecting right dominant side ([place]) 04/13/2015  . 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 Left middle cerebral artery stroke ([place]) 10/21/2014  . Right hemiparesis ([place]) 10/21/2014  . Aphasia due to stroke 10/21/2014    Quay Burow, OTR/L 05/11/2015, 1:30 PM  [facility] [address] [place], [place], [address] Phone: [phone]   Fax:  [phone]  Name: Mario [name] MRN: [id number] Date of Birth: 14-Sep-1975

## 2015-05-11 NOTE — Therapy (Signed)
[facility] [address] [place], [place], [address] Phone: [phone]   Fax:  [phone]  Physical Therapy Treatment  Patient Details  Name: Mario [name] MRN: [id number] Date of Birth: April 16, 1976 No Data Recorded  Encounter Date: 05/11/2015      PT End of Session - 05/11/15 0944    Visit Number 23   Number of Visits 25   Date for PT Re-Evaluation 05/15/15   PT Start Time 0931   PT Stop Time 1018   PT Time Calculation (min) 47 min   Equipment Utilized During Treatment Gait belt   Activity Tolerance Patient tolerated treatment well   Behavior During Therapy [facility] for tasks assessed/performed      Past Medical History  Diagnosis Date  . Hypertension     Past Surgical History  Procedure Laterality Date  . No past surgeries      There were no vitals filed for this visit.  Visit Diagnosis:  Abnormality of gait  Unsteadiness  Weakness due to cerebrovascular accident  Spastic hemiplegia affecting right dominant side [facility])      Subjective Assessment - 05/11/15 0937    Subjective States through increased time that exercises are "too many" and that he "sleeps a lot."    Limitations Walking;House hold activities;Reading;Standing;Writing   Patient Stated Goals Improve speech, be able to walk better, and get functional use of RUE.    Currently in Pain? No/denies              TE:  Addressed compliance with HEP as pt states with increased time that exercises were "too much."  Educated to break exercises down and perform 3-4 exercises daily.  Pt verbalized understanding.  Went over supine RLE only bridging x 2 sets of 10 reps with cues for improved R knee control to avoid over abduction.  Also performed prone R hamstring curl x 10 reps (another 10 reps with NMES) with continued cues for decreased hip/trunk compensation.    NMR:  Performed prone exercise as above with NMES for increased carryover to gait  later in session.  Performed x 10 reps.  Progressed to gait training without brace (AFO) with NMES on hamstrings as well as R ant tib in order to improve R foot clearance as well as address increased isolated knee flex during swing phase of gait.  Difficult to isolate NMES activation due to both would activate at same time, however during activation, clearance was much improved however noted increased fatigue and ankle instability when NMES not on and with increased gait.  Will continue to address during sessions.                    PT Education - 05/11/15 [phone]    Education provided Yes   Education Details education to break HEP up so as not to be overwhelmed by doing many exercises.    Person(s) Educated Patient   Methods Explanation   Comprehension Verbalized understanding          PT Short Term Goals - 03/04/15 1035    PT SHORT TERM GOAL #1   Title Pt will improve BERG balance score to 56/56 to decrease fall risk and improve functional mobility. (Target 03/03/15)   Baseline 55/56 on 03/04/15   Status Not Met   PT SHORT TERM GOAL #2   Title Pt will improve gait speed to 2.83 ft/sec to improve functional ambulation level to community ambulator.   (Target 03/03/15)   Baseline  2.00 ft/sec on 03/04/15, feel that this is due to increased tone.    Status Not Met   PT SHORT TERM GOAL #3   Title Pt will ambulate >500' on indoor and outdoor surfaces including grass, inclines and curb step without AD with AFO at S level without overt LOB to indicate safe negotiation of indoor and outdoor surfaces.  (Target 03/03/15)   Baseline met on 03/03/15, however recommend use of cane at home for increased safety and to decrease tone with gait.     Status Achieved   PT SHORT TERM GOAL #4   Title Pt will negotiate up/down 4 steps in step to pattern with single rail at mod I level with safe technique to traverse community obstacles.   (Target 03/03/15)   Baseline Met on 9/22.   Status Achieved   PT  SHORT TERM GOAL #5   Title Pt will improve 3MWT to 498' to improve ambulation function and functional endurance.    Baseline 366' on 03/03/15   Status Not Met           PT Long Term Goals - 05/11/15 1223    PT LONG TERM GOAL #1   Title Pts SIS mobility score will improve to 80% to indicate improved perceived functional mobility.  (Modified Target 05/15/15)  Target dates updated due to missing 2 wks of PT   Baseline 66.7% on 8/31   Time 8   Period Weeks   Status On-going   PT LONG TERM GOAL #2   Title Pt will improve ankle DF strength to 3/5 in order to increase safety with household ambulation and begin to address ambulation without use of AFO.  (Modified Target 05/15/15)   Baseline still with 3-/5 05/11/15   Time 8   Period Weeks   Status On-going   PT LONG TERM GOAL #3   Title Pt will verbalize ability to return to leisure activities to indicate return to community function.   (Modified Target 05/15/15)   Baseline met 05/04/15 (went fishing over weekend with father)   Time 8   Period Weeks   Status Achieved   PT LONG TERM GOAL #4   Title Pt will negotiate up/down 4 steps without rail in alternating fashion to traverse community stairs while carrying object.  (Modified Target 05/15/15)   Baseline Met 11/9.   Time 8   Period Weeks   Status Achieved   PT LONG TERM GOAL #5   Title Pt will demonstrate ability to ambulate up to 150' with decreased R genu recurvatum to improve R knee stability/strength and prevent knee injury.  (Target Date: 04/16/15)   Baseline met 04/14/15   Time 8   Period Weeks   Status Achieved   PT LONG TERM GOAL #6   Title Pt will demonstrate ability to perform SLS x 5 secs on RLE to improve balance and awareness of RLE. (Modified Target Date: 05/15/15)   Time 8   Period Weeks   Status On-going               Plan - 05/11/15 0944    Clinical Impression Statement Skilled session focused on compliance with HEP for NMR in RLE, gait training and  exercise with [place] eof NMES to R hamstrings and anterior tibialis to increase clearance and improve gait quality.    Pt will benefit from skilled therapeutic intervention in order to improve on the following deficits Abnormal gait;Decreased balance;Decreased coordination;Decreased knowledge of precautions;Decreased safety awareness;Decreased strength;Difficulty walking;Impaired perceived functional ability;Impaired  sensation;Impaired flexibility;Impaired tone;Impaired UE functional use;Postural dysfunction;Decreased knowledge of use of DME   Rehab Potential Excellent   Clinical Impairments Affecting Rehab Potential adhering to safety precautions   PT Frequency 2x / week   PT Duration 4 weeks   PT Treatment/Interventions ADLs/Self Care Home Management;Electrical Stimulation;Biofeedback;DME Instruction;Gait training;Stair training;Functional mobility training;Therapeutic exercise;Balance training;Neuromuscular re-education;Patient/family education;Manual techniques;Visual/perceptual remediation/compensation   PT Next Visit Plan Check remaining LTG's (add goals as needed) and re-cert (pt should have scheduled additional visits last time).  R knee stability, stretching heel cord, isolated hip/knee flex, RLE WB, RLE activation.  Continue progressing toward goals.     Consulted and Agree with Plan of Care Patient        Problem List Patient Active Problem List   Diagnosis Date Noted  . Spastic hemiplegia affecting right dominant side ([place]) 04/13/2015  . 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 Left middle cerebral artery stroke ([place]) 10/21/2014  . Right hemiparesis ([place]) 10/21/2014  . Aphasia due to stroke 10/21/2014    Cameron Sprang, PT, MPT [facility] [address] [place], [place], [address] Phone: [phone]   Fax:  [phone] 05/11/2015, 12:25 PM  Name: ARIF AMENDOLA MRN: [id number] Date of Birth: 06/15/1975

## 2015-05-13 ENCOUNTER — Encounter: Payer: Self-pay | Admitting: Rehabilitation

## 2015-05-13 ENCOUNTER — Ambulatory Visit: Payer: BC Managed Care – PPO

## 2015-05-13 ENCOUNTER — Ambulatory Visit: Payer: BC Managed Care – PPO | Admitting: Occupational Therapy

## 2015-05-13 ENCOUNTER — Encounter: Payer: Self-pay | Admitting: Occupational Therapy

## 2015-05-13 ENCOUNTER — Ambulatory Visit: Payer: BC Managed Care – PPO | Admitting: Rehabilitation

## 2015-05-13 DIAGNOSIS — R482 Apraxia: Secondary | ICD-10-CM

## 2015-05-13 DIAGNOSIS — G8111 Spastic hemiplegia affecting right dominant side: Secondary | ICD-10-CM | POA: Diagnosis not present

## 2015-05-13 DIAGNOSIS — R2681 Unsteadiness on feet: Secondary | ICD-10-CM

## 2015-05-13 DIAGNOSIS — R4189 Other symptoms and signs involving cognitive functions and awareness: Secondary | ICD-10-CM

## 2015-05-13 DIAGNOSIS — IMO0002 Reserved for concepts with insufficient information to code with codable children: Secondary | ICD-10-CM

## 2015-05-13 DIAGNOSIS — R269 Unspecified abnormalities of gait and mobility: Secondary | ICD-10-CM

## 2015-05-13 DIAGNOSIS — R4701 Aphasia: Secondary | ICD-10-CM

## 2015-05-13 DIAGNOSIS — M6281 Muscle weakness (generalized): Secondary | ICD-10-CM

## 2015-05-13 DIAGNOSIS — R201 Hypoesthesia of skin: Secondary | ICD-10-CM
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
## 2015-05-13 NOTE — Addendum Note (Signed)
Addended by: Harriet ButtePARCELL, Hyde Sires A on: 05/13/2015 07:48 PM   Modules accepted: Orders

## 2015-05-13 NOTE — Therapy (Signed)
[facility] [address] [place], [place], [address] Phone: [phone]   Fax:  [phone]  Speech Language Pathology Treatment  Patient Details  Name: Mario [name] MRN: [id number] Date of Birth: 01-14-1976 No Data Recorded  Encounter Date: 05/13/2015      End of Session - 05/13/15 1123    Visit Number 55      Past Medical History  Diagnosis Date  . Hypertension     Past Surgical History  Procedure Laterality Date  . No past surgeries      There were no vitals filed for this visit.  Visit Diagnosis: Expressive aphasia  Receptive aphasia  Verbal apraxia      Subjective Assessment - 05/13/15 0859    Subjective "Hey."               ADULT SLP TREATMENT - 05/13/15 0859    General Information   Behavior/Cognition Alert;Cooperative;Pleasant mood   Treatment Provided   Treatment provided Cognitive-Linquistic   Pain Assessment   Pain Assessment No/denies pain   Cognitive-Linquistic Treatment   Treatment focused on Apraxia;Aphasia   Skilled Treatment SLP and pt had approx 20 minute conversation re: Christmas gifts with pt conveying message functionally using written language, pictures, yes/no responses to SLP questioning cues, and verbal responses 70% success. Pt identified that in simple conversation conversation was comparable to what SLP experienced with him today and is functional. Because of this functional communication at this level, SLP re-educated pt re: augmentative/alternative communication devices and pt indicated with facial expression and "yes" that he was still interested in pursuing this option. Pt reports paperwork has been sent in for [facility].    Assessment / Recommendations / [place] with current plan of care  Discharge likely next 1-2 weeks   Progression Toward Goals   Progression toward goals --  pt             SLP Short  Term Goals - 05/11/15 0911    SLP SHORT TERM GOAL #6   Title pt will provide functional multimodal responses (except writing) in simple conversation with occasional min A   Baseline for 1 weeks or 3 more visits, for all STGs   Time 1   Period Weeks   Status Not Met   SLP SHORT TERM GOAL #7   Title pt will demo understanding of mod complex conversation with repeats allowed 90%   Time 1   Period Weeks   Status Not Met   SLP SHORT TERM GOAL #8   Title pt will write one word responses to communicate functionally, with multiple attempts allowed, achieving 75% listener comprehension   Status Achieved          SLP Long Term Goals - 05/13/15 0900    SLP LONG TERM GOAL #1   Title pt will demo undertanding of simple/mod complex conversational questions via yes/no responses 80% accuracy   Period --  or 16 visits - for all LTGs   Status Achieved   SLP LONG TERM GOAL #2   Title pt will demo undertanding of simple conversational quesitons via yes/no responses 90% accuracy   Status Achieved   SLP LONG TERM GOAL #3   Title pt will perform rote speech tasks simultaneously with SLP with 40% accuracy (functional responses allowed)   Status Not Met   SLP LONG TERM GOAL #4   Title pt will imitate salient words/family names with 20% success  Status Achieved   SLP LONG TERM GOAL #5   Title pt will demo use of simple alternative/augmentative communcation and/or multimodal communication with 80% success in mod complex message conveyance   Baseline 5 weeks or 7 more visits   Time 5   Period Weeks   Status Revised   SLP LONG TERM GOAL #6   Title pt will state family names/information and other salient personal 2-3 word information with 80% success   Baseline 5 weeks or 7 more visits   Time 5   Period Weeks   Status On-going          Plan - 05/13/15 1125    Clinical Impression Statement In simple conversation today pt used compensations throughout the conversation when cued initially by  SLP, and indicated this is like conversation outside therapy.   Speech Therapy Frequency 2x / week   Duration 4 weeks   Treatment/Interventions SLP instruction and feedback;Compensatory strategies;Internal/external aids;Environmental controls;Patient/family education;Functional tasks;Cueing hierarchy;Multimodal communcation approach   Potential to Achieve Goals Fair   Potential Considerations Severity of impairments        Problem List Patient Active Problem List   Diagnosis Date Noted  . Spastic hemiplegia affecting right dominant side ([place]) 04/13/2015  . 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 Left middle cerebral artery stroke ([place]) 10/21/2014  . Right hemiparesis ([place]) 10/21/2014  . Aphasia due to stroke 10/21/2014    [facility] , MS, CCC-SLP  05/13/2015, 11:31 AM  [facility] [address] [place], [place], [address] Phone: [phone]   Fax:  [phone]   Name: Mario [name] MRN: [id number] Date of Birth: [birth year]

## 2015-05-13 NOTE — Therapy (Signed)
[facility] [address] [place], [place], [address] Phone: [phone]   Fax:  [phone]  Physical Therapy Treatment  Patient Details  Name: Mario [name] MRN: [id number] Date of Birth: 1975/09/18 No Data Recorded  Encounter Date: 05/13/2015      PT End of Session - 05/13/15 1924    Visit Number 24   Number of Visits 33   Date for PT Re-Evaluation 37/34/28  re-cert for 4 more weeks   PT Start Time 1017   PT Stop Time 1100   PT Time Calculation (min) 43 min   Equipment Utilized During Treatment --   Activity Tolerance Patient tolerated treatment well   Behavior During Therapy [facility] for tasks assessed/performed      Past Medical History  Diagnosis Date  . Hypertension     Past Surgical History  Procedure Laterality Date  . No past surgeries      There were no vitals filed for this visit.  Visit Diagnosis:  Abnormality of gait  Unsteadiness  Weakness due to cerebrovascular accident  Spastic hemiplegia affecting right dominant side ([place])      Subjective Assessment - 05/13/15 1024    Subjective Pt reports doing exercises, even harder one that he didn't enjoy doing.     Patient is accompained by: Family member   Limitations Walking;House hold activities;Reading;Standing;Writing   Patient Stated Goals Improve speech, be able to walk better, and get functional use of RUE.    Currently in Pain? No/denies           NMR:  Addressed R SLS and RUE support, increased R hip activation and isolated hip and knee flexion on R to carry over to gait, as well as WB through RUE for improved shoulder and scapular stability.  Performed stepping task in // bars with RUE only support while tapping LLE up to higher chair>tapping to styrofoam cup in order to increase amount of time spent in RLE stance.  Provided mirror for increased visual feedback on increased R hip protraction during task, as he demonstrates great R  knee control, but tended to keep R hip retracted.  Progressed to quadruped task while moving RLE in hip/knee flex on small ball x 10 reps, inclined on physioball with RLE stabilized in bridge position while LLE rolled small bout out/in to increase R hip stabilization.   Ended session with pt in "push up" position with BLEs on physioball performing B hip/knee flex with assist for completing task and assisting to maintain hips in neutral alignment as he tends to rotate to L side.  Tolerated well during session with fatigue noted.                        PT Education - 05/13/15 1924    Education provided Yes   Education Details education on re-cert, having pt involved in goal setting   Person(s) Educated Patient   Methods Explanation   Comprehension Verbalized understanding          PT Short Term Goals - 03/04/15 1035    PT SHORT TERM GOAL #1   Title Pt will improve BERG balance score to 56/56 to decrease fall risk and improve functional mobility. (Target 03/03/15)   Baseline 55/56 on 03/04/15   Status Not Met   PT SHORT TERM GOAL #2   Title Pt will improve gait speed to 2.83 ft/sec to improve functional ambulation level to community ambulator.   (Target 03/03/15)  Baseline 2.00 ft/sec on 03/04/15, feel that this is due to increased tone.    Status Not Met   PT SHORT TERM GOAL #3   Title Pt will ambulate >500' on indoor and outdoor surfaces including grass, inclines and curb step without AD with AFO at S level without overt LOB to indicate safe negotiation of indoor and outdoor surfaces.  (Target 03/03/15)   Baseline met on 03/03/15, however recommend use of cane at home for increased safety and to decrease tone with gait.     Status Achieved   PT SHORT TERM GOAL #4   Title Pt will negotiate up/down 4 steps in step to pattern with single rail at mod I level with safe technique to traverse community obstacles.   (Target 03/03/15)   Baseline Met on 9/22.   Status Achieved   PT  SHORT TERM GOAL #5   Title Pt will improve 3MWT to 498' to improve ambulation function and functional endurance.    Baseline 366' on 03/03/15   Status Not Met           PT Long Term Goals - 05/13/15 1931    PT LONG TERM GOAL #1   Title Pts SIS mobility score will improve to 80% to indicate improved perceived functional mobility.  (Modified Target 05/15/15)  Target dates updated due to missing 2 wks of PT   Baseline 66.7% on 8/31   Time 8   Period Weeks   Status On-going   PT LONG TERM GOAL #2   Title Pt will improve ankle DF strength to 3/5 in order to increase safety with household ambulation and begin to address ambulation without use of AFO.  (Modified Target 05/15/15)   Baseline still with 3-/5 05/11/15   Time 8   Period Weeks   Status On-going   PT LONG TERM GOAL #3   Title Pt will verbalize ability to return to leisure activities to indicate return to community function.   (Modified Target 05/15/15)   Baseline met 05/04/15 (went fishing over weekend with father)   Time 8   Period Weeks   Status Achieved   PT LONG TERM GOAL #4   Title Pt will negotiate up/down 4 steps without rail in alternating fashion to traverse community stairs while carrying object.  (Modified Target 05/15/15)   Baseline Met 11/9.   Time 8   Period Weeks   Status Achieved   PT LONG TERM GOAL #5   Title Pt will demonstrate ability to ambulate up to 150' with decreased R genu recurvatum to improve R knee stability/strength and prevent knee injury.  (Target Date: 04/16/15)   Baseline met 04/14/15   Time 8   Period Weeks   Status Achieved   Additional Long Term Goals   Additional Long Term Goals Yes   PT LONG TERM GOAL #6   Title Pt will demonstrate ability to perform SLS x 5 secs on RLE to improve balance and awareness of RLE. (Modified Target Date: 06/10/15)   Baseline can tolerate up to 3 seconds with RUE support 05/13/15   Time 8   Period Weeks   Status On-going   PT LONG TERM GOAL #7   Title Pt  will ambulate on all outdoor surfaces without AD with ASO only in order to indicate improved gait pattern, increased RLE stability and safety in returning to more challenging leisure activities.  (Target Date: 06/10/15)   PT LONG TERM GOAL #8   Title Pt will demonstrate 3+/5 R hamstring  strength with decreased hip/trunk compensation to indicate improved and more efficient gait pattern.  (Target Date: 06/10/15)               Plan - 05/13/15 1928    Clinical Impression Statement Skilled session focused on R SLS to address LTG.  Note that he requires single UE support, but is able to perform with increased time for up to 3 seconds.  Note improved R knee control, however continues to demonstrate decreased R glute med/extension during stance.  Also addressed isloated hip/knee flexion to carryover to improved gait pattern.  Feel pt is making great progress, therefore plan to recert pt for 4 more weeks.    Pt will benefit from skilled therapeutic intervention in order to improve on the following deficits Abnormal gait;Decreased balance;Decreased coordination;Decreased knowledge of precautions;Decreased safety awareness;Decreased strength;Difficulty walking;Impaired perceived functional ability;Impaired sensation;Impaired flexibility;Impaired tone;Impaired UE functional use;Postural dysfunction;Decreased knowledge of use of DME   Rehab Potential Excellent   Clinical Impairments Affecting Rehab Potential adhering to safety precautions   PT Frequency 2x / week   PT Duration 4 weeks   PT Treatment/Interventions ADLs/Self Care Home Management;Electrical Stimulation;Biofeedback;DME Instruction;Gait training;Stair training;Functional mobility training;Therapeutic exercise;Balance training;Neuromuscular re-education;Patient/family education;Manual techniques;Visual/perceptual remediation/compensation   PT Next Visit Plan  R knee stability, stretching heel cord, isolated hip/knee flex, RLE WB, RLE activation.   Continue progressing toward goals.     Consulted and Agree with Plan of Care Patient        Problem List Patient Active Problem List   Diagnosis Date Noted  . Spastic hemiplegia affecting right dominant side ([place]) 04/13/2015  . 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 Left middle cerebral artery stroke ([place]) 10/21/2014  . Right hemiparesis ([place]) 10/21/2014  . Aphasia due to stroke 10/21/2014    Cameron Sprang, PT, MPT [facility] [address] [place], [place], [address] Phone: [phone]   Fax:  [phone] 05/13/2015, 7:36 PM  Name: Mario [name] MRN: [id number] Date of Birth: 1975-09-16

## 2015-05-13 NOTE — Patient Instructions (Signed)
  We are nearing the end of our therapy course for speech. We should be completed next week. We all have worked heavily on compensating for talking by drawing, writing, and gesturing in order to convey his message. He is much more able and willing to do these compensations now than before. We think we have taken him as far as we can at this point, as he has communicated that this communication is functional for him currently. We agree it could be better and so we wanted to make sure you have all you need for the referral to [facility]. Vernona RiegerLaura provided paperwork 4-6 weeks ago, and [facility]  Let us know if anything else is needed for that referral.  And please feel free to call if you have questions.  Junius Roadsarl and Laura, speech pathologists [phone]

## 2015-05-18 ENCOUNTER — Encounter: Payer: Self-pay | Admitting: Physical Therapy

## 2015-05-18 ENCOUNTER — Encounter: Payer: Self-pay | Admitting: Occupational Therapy

## 2015-05-18 ENCOUNTER — Ambulatory Visit: Payer: BC Managed Care – PPO | Admitting: Physical Therapy

## 2015-05-18 ENCOUNTER — Ambulatory Visit: Payer: BC Managed Care – PPO | Admitting: Occupational Therapy

## 2015-05-18 ENCOUNTER — Ambulatory Visit: Payer: BC Managed Care – PPO | Admitting: Speech Pathology

## 2015-05-18 DIAGNOSIS — R482 Apraxia: Secondary | ICD-10-CM

## 2015-05-18 DIAGNOSIS — IMO0002 Reserved for concepts with insufficient information to code with codable children: Secondary | ICD-10-CM

## 2015-05-18 DIAGNOSIS — I69359 Hemiplegia and hemiparesis following cerebral infarction affecting unspecified side: Secondary | ICD-10-CM

## 2015-05-18 DIAGNOSIS — G8111 Spastic hemiplegia affecting right dominant side: Secondary | ICD-10-CM

## 2015-05-18 DIAGNOSIS — R4189 Other symptoms and signs involving cognitive functions and awareness: Secondary | ICD-10-CM

## 2015-05-18 DIAGNOSIS — M6281 Muscle weakness (generalized): Secondary | ICD-10-CM

## 2015-05-18 DIAGNOSIS — R4701 Aphasia: Secondary | ICD-10-CM

## 2015-05-18 DIAGNOSIS — R269 Unspecified abnormalities of gait and mobility: Secondary | ICD-10-CM

## 2015-05-18 DIAGNOSIS — R201 Hypoesthesia of skin: Secondary | ICD-10-CM

## 2015-05-18 NOTE — Therapy (Signed)
[facility] [address] [place], [place], [address] Phone: [phone]   Fax:  [phone]  Speech Language Pathology Treatment  Patient Details  Name: Mario [name] MRN: [id number] Date of Birth: 03/10/1976 No Data Recorded  Encounter Date: 05/18/2015      End of Session - 05/18/15 1148    Visit Number 28   Number of Visits 33   Date for SLP Re-Evaluation 06/04/15   SLP Start Time 1103   SLP Stop Time  1146   SLP Time Calculation (min) 43 min      Past Medical History  Diagnosis Date  . Hypertension     Past Surgical History  Procedure Laterality Date  . No past surgeries      There were no vitals filed for this visit.  Visit Diagnosis: Expressive aphasia  Verbal apraxia      Subjective Assessment - 05/18/15 1111    Subjective "Hey, wow!"   Currently in Pain? No/denies               ADULT SLP TREATMENT - 05/18/15 0001    General Information   Behavior/Cognition Alert;Cooperative;Pleasant mood   Treatment Provided   Treatment provided Cognitive-Linquistic   Pain Assessment   Pain Assessment No/denies pain   Cognitive-Linquistic Treatment   Treatment focused on Apraxia;Aphasia   Skilled Treatment Faciliated naming via writng/drawing - 5 items for a given category with usual mod semantic cues  with spelling 70% accurate and max A for error awareness.     Assessment / Recommendations / Plan   Plan Continue with current plan of care            SLP Short Term Goals - 05/18/15 1147    SLP SHORT TERM GOAL #6   Title pt will provide functional multimodal responses (except writing) in simple conversation with occasional min A   Baseline for 1 weeks or 3 more visits, for all STGs   Time 1   Period Weeks   Status Not Met   SLP SHORT TERM GOAL #7   Title pt will demo understanding of mod complex conversation with repeats allowed 90%   Time 1   Period Weeks   Status Not Met   SLP  SHORT TERM GOAL #8   Title pt will write one word responses to communicate functionally, with multiple attempts allowed, achieving 75% listener comprehension   Status Achieved          SLP Long Term Goals - 05/18/15 1147    SLP LONG TERM GOAL #1   Title pt will demo undertanding of simple/mod complex conversational questions via yes/no responses 80% accuracy   Period --  or 16 visits - for all LTGs   Status Achieved   SLP LONG TERM GOAL #2   Title pt will demo undertanding of simple conversational quesitons via yes/no responses 90% accuracy   Status Achieved   SLP LONG TERM GOAL #3   Title pt will perform rote speech tasks simultaneously with SLP with 40% accuracy (functional responses allowed)   Status Not Met   SLP LONG TERM GOAL #4   Title pt will imitate salient words/family names with 20% success   Status Achieved   SLP LONG TERM GOAL #5   Title pt will demo use of simple alternative/augmentative communcation and/or multimodal communication with 80% success in mod complex message conveyance   Baseline 5 weeks or 7 more visits   Time 5   Period Weeks  Status Revised   SLP LONG TERM GOAL #6   Title pt will state family names/information and other salient personal 2-3 word information with 80% success   Baseline 5 weeks or 7 more visits   Time 5   Period Weeks   Status On-going          Plan - 05/18/15 1146    Clinical Impression Statement Naming items in simple categories with usual min to mod semantic cues, utilizing writing, verbal approximation, drawing and gestures. Continue skilled ST to maximize carryover of multimodal expression.   Speech Therapy Frequency 2x / week   Duration 4 weeks   Treatment/Interventions SLP instruction and feedback;Compensatory strategies;Internal/external aids;Environmental controls;Patient/family education;Functional tasks;Cueing hierarchy;Multimodal communcation approach   Potential to Achieve Goals Fair   Potential Considerations  Severity of impairments   Consulted and Agree with Plan of Care Patient        Problem List Patient Active Problem List   Diagnosis Date Noted  . Spastic hemiplegia affecting right dominant side ([place]) 04/13/2015  . 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 Left middle cerebral artery stroke ([place]) 10/21/2014  . Right hemiparesis ([place]) 10/21/2014  . Aphasia due to stroke 10/21/2014    Lovvorn, Annye Rusk MS, CCC-SLP 05/18/2015, 11:48 AM  [employer] [address] [place], [place], [address] Phone: [phone]   Fax:  [phone]   Name: Mario [name] MRN: [id number] Date of Birth: January 05, 1976

## 2015-05-18 NOTE — Therapy (Signed)
[facility] [address] [place], [place], [address] Phone: [phone]   Fax:  [phone]  Occupational Therapy Treatment  Patient Details  Name: Mario [name] MRN: [id number] Date of Birth: 12-Oct-1975 Referring Provider: Blane Ohara  Encounter Date: 05/18/2015      OT End of Session - 05/18/15 1310    Visit Number 27   Number of Visits 32   Date for OT Re-Evaluation 06/03/15   Authorization Type [employer] state health PPO no visit limit   OT Start Time 1015   OT Stop Time 1105   OT Time Calculation (min) 50 min   Activity Tolerance Patient tolerated treatment well      Past Medical History  Diagnosis Date  . Hypertension     Past Surgical History  Procedure Laterality Date  . No past surgeries      There were no vitals filed for this visit.  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 No/denies                      OT Treatments/Exercises (OP) - 05/18/15 0001    ADLs   Eating Pt issued red foam and pracitced eating meatballs using R hand and normal grasp pattern to self feed. Pt able to do with min compensatons. Pt asked to use R hand with foam to try and eat 50% of meal. Pt indicated he would try.    Neurological Re-education Exercises   Other Exercises 1 Neuro re ed in supine to addres unilateral open chained reaching with grasp and release task incorporated. Pt with moderate difficulty but able to complete several repetitions in various planes. In sitting functional task to focus on hand manipulation of eating  utensil with LUE in constrait induced approach. Focus on allowing pt problem solve motorically to pick up fork and orient appropriately in R hand without using L hand to assist.  Pt required multiple attempts however was able to complete task and then asked to repeat several times to assist with motor learning.                   OT Short Term Goals - 05/18/15 1308    OT SHORT TERM GOAL #5   Status --  pt able to use as gross assist for some parts of ADL activity   OT SHORT TERM GOAL #6   Title Pt will be mod I with upgraded HEP - 05/06/2015 (dated adjusted as pt missed one week of OT)   Status Achieved   OT SHORT TERM GOAL #7   Title Pt will demonstrate ability to cast fishing pole with RUE with no more than mod assist   Status Achieved   OT SHORT TERM GOAL #8   Title Pt will be able to use RUE as stabilizer during hot meal prep   Status Achieved   OT SHORT TERM GOAL  #9   TITLE Pt will demonstrate ability to pick up and place light object at 90* of reach with RUE with min compensations.   Status Achieved  OT Long Term Goals - 05/18/15 1308    OT LONG TERM GOAL #6   Title --  moved to STG on 03/30/2015   OT LONG TERM GOAL #8   Title Pt will be mod I with upgraded HEP prn - 06/03/2015 (date adjusted as pt missed one week of OT)   Status On-going   OT LONG TERM GOAL  #9   Baseline Pt will demonstrate ability to overhead reach to grasp light object off shelf with RUE with no more than min facilitation   Status On-going   OT LONG TERM GOAL  #10   TITLE Pt will demostrate ability to use RUE as gross assist for 100% of basic self care tasks.   Status On-going   OT LONG TERM GOAL  #11   TITLE Pt will be able to use RUE as gross assist during hot meal prep   Status On-going               Plan - 05/18/15 1309    Clinical Impression Statement Pt making excellent progress toward goals. Pt with improved functional use of R hand with forced paradaigm.   Pt  will benefit from skilled therapeutic intervention in order to improve on the following deficits (Retired) Abnormal gait;Decreased balance;Decreased coordination;Decreased cognition;Decreased knowledge of use of DME;Decreased mobility;Decreased range of motion;Decreased strength;Increased edema;Impaired UE functional use;Impaired tone;Impaired sensation;Pain   Rehab Potential Good   Clinical Impairments Affecting Rehab Potential global aphasia, apraxia, impaired sensation   OT Frequency 2x / week   OT Duration 8 weeks   OT Treatment/Interventions Self-care/ADL training;Ultrasound;Moist Heat;Electrical Stimulation;DME and/or AE instruction;Neuromuscular education;Therapeutic exercise;Functional Mobility Training;Manual Therapy;Passive range of motion;Splinting;Therapeutic activities;Balance training;Patient/family education;Cognitive remediation/compensation   Plan NMR to RUE - reach, unilateral reach, functional use, grasp and release.    Consulted and Agree with Plan of Care Patient        Problem List Patient Active Problem List   Diagnosis Date Noted  . Spastic hemiplegia affecting right dominant side ([facility]) 04/13/2015  . 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 Right hemiparesis ([facility]) 10/21/2014  . Aphasia due to stroke 10/21/2014    [place]orton [place], Delmy Holdren Halliday  , OTR/L  05/18/2015, 1:12 PM  [facility] [address] [place], [place], [address] Phone: [phone]   Fax:  [phone]  Name: Mario [name] MRN: [id number] Date of Birth: 04/11/1976

## 2015-05-19 NOTE — Therapy (Signed)
[facility] [address] [place], [place], [address] Phone: [phone]   Fax:  [phone]  Physical Therapy Treatment  Patient Details  Name: Mario [name] MRN: [id number] Date of Birth: 07-08-1975 No Data Recorded  Encounter Date: 05/18/2015      PT End of Session - 05/19/15 1829    Visit Number 25   Number of Visits 33   Date for PT Re-Evaluation 06/10/15   PT Start Time 0932   PT Stop Time 1016   PT Time Calculation (min) 44 min      Past Medical History  Diagnosis Date  . Hypertension     Past Surgical History  Procedure Laterality Date  . No past surgeries      There were no vitals filed for this visit.  Visit Diagnosis:  Muscle weakness  Abnormality of gait  Hemiparesis following cerebrovascular accident [facility])      Subjective Assessment - 05/18/15 1643    Subjective Pt expresses that left knee is a problem when asked what he had difficulty doing or having alot of trouble doing   Patient Stated Goals Improve speech, be able to walk better, and get functional use of RUE.    Currently in Pain? No/denies                         [facility] Adult PT Treatment/Exercise - 05/19/15 0001    Transfers   Transfers Sit to Stand;Stand to Sit;Stand Pivot Transfers   Sit to Stand 5: Supervision   Number of Reps Other reps (comment)  5     TherEx:   Balance bubble placed under L foot for incr. RLE weight-bearing - with minimal UE support x 5 reps; Heel raises x 10 reps  with more weight on RLE than on LLE; Closed chain plantarflexion off hi/lo table - RLE x 2 sets of 10 - with min assist R heel cord stretching x 60 sec hold with 4" step  NeuroRe-ed;  Tap ups to 6" step x 10 reps with LLE - min tactile cues to prevent R genu recurvatum;  R knee extension  control exercise with blue band - in bil. Stance, Forward and backward stance 10 reps each Tall kneeling position on mat - moving  LLE up/back and out/in 10 reps each direction; walking forward on knees on mat, then  backward for trunk/core stabilization and R hip strengthening  Modified tall kneeling - leaning on mat - lifting RLE back with hip extended - isolated knee flexion/extension with mod assist x 10 reps;  R hip flexion/extension in this position x 10 reps with assistance  step ups forward and step down with RLE x 10 reps each with CGA for quad strengthening and improved knee control RLE         PT Short Term Goals - 03/04/15 1035    PT SHORT TERM GOAL #1   Title Pt will improve BERG balance score to 56/56 to decrease fall risk and improve functional mobility. (Target 03/03/15)   Baseline 55/56 on 03/04/15   Status Not Met   PT SHORT TERM GOAL #2   Title Pt will improve gait speed to 2.83 ft/sec to improve functional ambulation level to community ambulator.   (Target 03/03/15)   Baseline 2.00 ft/sec on 03/04/15, feel that this is due to increased tone.    Status Not Met   PT SHORT TERM GOAL #3   Title Pt will ambulate >500'  on indoor and outdoor surfaces including grass, inclines and curb step without AD with AFO at S level without overt LOB to indicate safe negotiation of indoor and outdoor surfaces.  (Target 03/03/15)   Baseline met on 03/03/15, however recommend use of cane at home for increased safety and to decrease tone with gait.     Status Achieved   PT SHORT TERM GOAL #4   Title Pt will negotiate up/down 4 steps in step to pattern with single rail at mod I level with safe technique to traverse community obstacles.   (Target 03/03/15)   Baseline Met on 9/22.   Status Achieved   PT SHORT TERM GOAL #5   Title Pt will improve 3MWT to 498' to improve ambulation function and functional endurance.    Baseline 366' on 03/03/15   Status Not Met           PT Long Term Goals - 05/13/15 1931    PT LONG TERM GOAL #1   Title Pts SIS mobility score will improve to 80% to indicate improved perceived  functional mobility.  (Modified Target 05/15/15)  Target dates updated due to missing 2 wks of PT   Baseline 66.7% on 8/31   Time 8   Period Weeks   Status On-going   PT LONG TERM GOAL #2   Title Pt will improve ankle DF strength to 3/5 in order to increase safety with household ambulation and begin to address ambulation without use of AFO.  (Modified Target 05/15/15)   Baseline still with 3-/5 05/11/15   Time 8   Period Weeks   Status On-going   PT LONG TERM GOAL #3   Title Pt will verbalize ability to return to leisure activities to indicate return to community function.   (Modified Target 05/15/15)   Baseline met 05/04/15 (went fishing over weekend with father)   Time 8   Period Weeks   Status Achieved   PT LONG TERM GOAL #4   Title Pt will negotiate up/down 4 steps without rail in alternating fashion to traverse community stairs while carrying object.  (Modified Target 05/15/15)   Baseline Met 11/9.   Time 8   Period Weeks   Status Achieved   PT LONG TERM GOAL #5   Title Pt will demonstrate ability to ambulate up to 150' with decreased R genu recurvatum to improve R knee stability/strength and prevent knee injury.  (Target Date: 04/16/15)   Baseline met 04/14/15   Time 8   Period Weeks   Status Achieved   Additional Long Term Goals   Additional Long Term Goals Yes   PT LONG TERM GOAL #6   Title Pt will demonstrate ability to perform SLS x 5 secs on RLE to improve balance and awareness of RLE. (Modified Target Date: 06/10/15)   Baseline can tolerate up to 3 seconds with RUE support 05/13/15   Time 8   Period Weeks   Status On-going   PT LONG TERM GOAL #7   Title Pt will ambulate on all outdoor surfaces without AD with ASO only in order to indicate improved gait pattern, increased RLE stability and safety in returning to more challenging leisure activities.  (Target Date: 06/10/15)   PT LONG TERM GOAL #8   Title Pt will demonstrate 3+/5 R hamstring strength with decreased  hip/trunk compensation to indicate improved and more efficient gait pattern.  (Target Date: 06/10/15)               Plan -  05/19/15 1830    Clinical Impression Statement Pt has RLE weakness, especially weak R gastroc and hamstrings and tight heel cords contributing to gait deviations including genu recurvatum; tolerated activity well with minimal rest breaks needed   Pt will benefit from skilled therapeutic intervention in order to improve on the following deficits Abnormal gait;Decreased balance;Decreased coordination;Decreased knowledge of precautions;Decreased safety awareness;Decreased strength;Difficulty walking;Impaired perceived functional ability;Impaired sensation;Impaired flexibility;Impaired tone;Impaired UE functional use;Postural dysfunction;Decreased knowledge of use of DME   Rehab Potential Excellent   PT Frequency 2x / week   PT Duration 4 weeks   PT Treatment/Interventions ADLs/Self Care Home Management;Electrical Stimulation;Biofeedback;DME Instruction;Gait training;Stair training;Functional mobility training;Therapeutic exercise;Balance training;Neuromuscular re-education;Patient/family education;Manual techniques;Visual/perceptual remediation/compensation   PT Next Visit Plan  R knee stability, stretching heel cord, isolated hip/knee flex, RLE WB, RLE activation.  Continue progressing toward goals.     PT Home Exercise Plan see pt instruction   Consulted and Agree with Plan of Care Patient        Problem List Patient Active Problem List   Diagnosis Date Noted  . Spastic hemiplegia affecting right dominant side ([place]) 04/13/2015  . 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 Left middle cerebral artery stroke ([place]) 10/21/2014  . Right hemiparesis ([place]) 10/21/2014  . Aphasia due to stroke 10/21/2014    Alda Lea, PT 05/19/2015, 6:36 PM  [facility] [address] [place], [place], [address] Phone: [phone]   Fax:  [phone]  Name: STANLEY LYNESS MRN: [id number] Date of Birth: [birth year]

## 2015-05-20 ENCOUNTER — Ambulatory Visit: Payer: BC Managed Care – PPO | Admitting: Occupational Therapy

## 2015-05-20 ENCOUNTER — Encounter: Payer: Self-pay | Admitting: Occupational Therapy

## 2015-05-20 ENCOUNTER — Ambulatory Visit: Payer: BC Managed Care – PPO

## 2015-05-20 ENCOUNTER — Ambulatory Visit: Payer: BC Managed Care – PPO | Admitting: Rehabilitation

## 2015-05-20 DIAGNOSIS — R4701 Aphasia: Secondary | ICD-10-CM

## 2015-05-20 DIAGNOSIS — IMO0002 Reserved for concepts with insufficient information to code with codable children: Secondary | ICD-10-CM

## 2015-05-20 DIAGNOSIS — G8111 Spastic hemiplegia affecting right dominant side: Secondary | ICD-10-CM | POA: Diagnosis not present

## 2015-05-20 DIAGNOSIS — R269 Unspecified abnormalities of gait and mobility: Secondary | ICD-10-CM

## 2015-05-20 DIAGNOSIS — M6281 Muscle weakness (generalized): Secondary | ICD-10-CM

## 2015-05-20 DIAGNOSIS — R531 Weakness: Secondary | ICD-10-CM

## 2015-05-20 DIAGNOSIS — R4189 Other symptoms and signs involving cognitive functions and awareness: Secondary | ICD-10-CM

## 2015-05-20 DIAGNOSIS — R482 Apraxia: Secondary | ICD-10-CM

## 2015-05-20 DIAGNOSIS — R2681 Unsteadiness on feet: Secondary | ICD-10-CM

## 2015-05-20 DIAGNOSIS — R201 Hypoesthesia of skin: Secondary | ICD-10-CM

## 2015-05-20 NOTE — Therapy (Signed)
[name] [address] [place], [place], [address] Phone: [phone]   Fax:  [phone]  Speech Language Pathology Treatment  Patient Details  Name: Mario [name] MRN: [id number] Date of Birth: [birth year] No Data Recorded  Encounter Date: 05/20/2015      End of Session - 05/20/15 0850    Visit Number 29   Number of Visits 33   Date for SLP Re-Evaluation 06/04/15   SLP Start Time 0804   SLP Stop Time  0846   SLP Time Calculation (min) 42 min   Activity Tolerance Patient tolerated treatment well      Past Medical History  Diagnosis Date  . Hypertension     Past Surgical History  Procedure Laterality Date  . No past surgeries      There were no vitals filed for this visit.  Visit Diagnosis: Expressive aphasia  Verbal apraxia      Subjective Assessment - 05/20/15 0821    Subjective "Hey -- (unintelligible)."   Currently in Pain? No/denies               ADULT SLP TREATMENT - 05/20/15 9833    General Information   Behavior/Cognition Alert;Cooperative;Pleasant mood   Treatment Provided   Treatment provided Cognitive-Linquistic   Cognitive-Linquistic Treatment   Treatment focused on Aphasia;Apraxia   Skilled Treatment SLP facilitated pt communication of foods/drinks with multimodal communcation encouraged. Pt did so with initial cues to use multimodal communication. Successful communication with consistent min-mod A from SLP (pt spoke the name 2/10). WIth occupations/buildings, pt communicated functionally with multimodal communication and max/total A from SLP.   Assessment / Recommendations / Plan   Plan Continue with current plan of care   Progression Toward Goals   Progression toward goals --  pt agrees to discharge next week            SLP Short Term Goals - 05/18/15 1147    SLP SHORT TERM GOAL #6   Title pt will provide functional multimodal responses (except writing) in  simple conversation with occasional min A   Baseline for 1 weeks or 3 more visits, for all STGs   Time 1   Period Weeks   Status Not Met   SLP SHORT TERM GOAL #7   Title pt will demo understanding of mod complex conversation with repeats allowed 90%   Time 1   Period Weeks   Status Not Met   SLP SHORT TERM GOAL #8   Title pt will write one word responses to communicate functionally, with multiple attempts allowed, achieving 75% listener comprehension   Status Achieved          SLP Long Term Goals - 05/20/15 0858    SLP LONG TERM GOAL #1   Title pt will demo undertanding of simple/mod complex conversational questions via yes/no responses 80% accuracy   Period --  or 16 visits - for all LTGs   Status Achieved   SLP LONG TERM GOAL #2   Title pt will demo undertanding of simple conversational quesitons via yes/no responses 90% accuracy   Status Achieved   SLP LONG TERM GOAL #3   Title pt will perform rote speech tasks simultaneously with SLP with 40% accuracy (functional responses allowed)   Status Not Met   SLP LONG TERM GOAL #4   Title pt will imitate salient words/family names with 20% success   Status Achieved   SLP LONG TERM GOAL #5   Title pt will demo  use of simple alternative/augmentative communcation and/or multimodal communication with 80% success in mod complex message conveyance   Baseline 5 weeks or 7 more visits   Time 1   Period Weeks   Status Revised   SLP LONG TERM GOAL #6   Title pt will state family names/information and other salient personal 2-3 word information with 80% success   Baseline 5 weeks or 7 more visits   Time 5   Period Weeks   Status On-going          Plan - 05/20/15 0850    Clinical Impression Statement In simple word naming tasks, pt is functionally communicating. Pt's success decreases as demand for linguistic complexity increases. SLP and pt came to the understanding today that pt would like to d/c next week instead of the following  week.   Speech Therapy Frequency 2x / week   Duration 1 week   Treatment/Interventions SLP instruction and feedback;Compensatory strategies;Internal/external aids;Environmental controls;Patient/family education;Functional tasks;Cueing hierarchy;Multimodal communcation approach   Potential to Achieve Goals Fair   Potential Considerations Severity of impairments   Consulted and Agree with Plan of Care Patient        Problem List Patient Active Problem List   Diagnosis Date Noted  . Spastic hemiplegia affecting right dominant side ([place]) 04/13/2015  . 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 Left middle cerebral artery stroke ([place]) 10/21/2014  . Right hemiparesis ([place]) 10/21/2014  . Aphasia due to stroke 10/21/2014    [facility] , MS, CCC-SLP 05/20/2015, 9:05 AM  [facility] [address] [place], [place], [address] Phone: [phone]   Fax:  [phone]   Name: Mario [name] MRN: [id number] Date of Birth: 12-13-1975
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
## 2015-05-20 NOTE — Patient Instructions (Signed)
SLP provided [employer] contact information for [employer] office.

## 2015-05-20 NOTE — Therapy (Signed)
[facility] [address] [place], [place], [address] Phone: [phone]   Fax:  [phone]  Physical Therapy Treatment  Patient Details  Name: Mario [name] MRN: [id number] Date of Birth: Oct 20, 1975 No Data Recorded  Encounter Date: 05/20/2015      PT End of Session - 05/20/15 9417    Visit Number 25   Number of Visits 33   Date for PT Re-Evaluation 06/10/15   PT Start Time 0932   PT Stop Time 1015   PT Time Calculation (min) 43 min   Activity Tolerance Patient tolerated treatment well   Behavior During Therapy [facility] for tasks assessed/performed      Past Medical History  Diagnosis Date  . Hypertension     Past Surgical History  Procedure Laterality Date  . No past surgeries      There were no vitals filed for this visit.  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    Limitations Walking;House hold activities;Reading;Standing;Writing   Patient Stated Goals Improve speech, be able to walk better, and get functional use of RUE.    Currently in Pain? No/denies            NMR:  Continue to address RLE stability with closed chain tasks as well as improved sustained activation on RLE, working towards LTG of R SLS x 5 seconds.  Had pt perform L step downs from curb step to increase amount of control in RLE as well as increased proprioceptive feedback on what bent knee feels like vs what extended leg feels like to carryover to gait and mobility tasks.  Performed x 2 sets of 10 reps with min A to prevent R knee extension.  Progressed to working on SLS in // bars with single UE (LUE progressing to RUE only) while raising LLE for at least 5 seconds.  Unable to tell if pt not trusting leg vs  increased apraxia during task, but he was able to perform x 10 reps up to 3-4 seconds.  Note R glute med weakness/decreased activation during task.  Progressed to placing B UEs on OT while PT stabilized RLE while working on repetition of raising LLE progressing to having pt keep RUE supported on shoulders and LUE reaching for target to further engage glute med on RLE.  Tolerated well x 2 sets of 10 reps with fatigue noted at end of session.  Ended with use of mirror for increased visual feedback during task while RUE stabilized on // bars and LLE tapped to styrofoam cup to decrease amount of use of LLE.    Gait:  Performed 230' without AD and R ASO only at S level with cues for posture, increased hip/knee flexion during R step, and increased time spent in R stance phase of gait.  Note marked improvement in R foot clearance and ability to activate R DF.                       PT Education - 05/20/15 1149    Education provided Yes   Education Details education on remaining goals   Person(s) Educated Patient   Methods Explanation   Comprehension Verbalized understanding          PT Short Term  Goals - 03/04/15 1035    PT SHORT TERM GOAL #1   Title Pt will improve BERG balance score to 56/56 to decrease fall risk and improve functional mobility. (Target 03/03/15)   Baseline 55/56 on 03/04/15   Status Not Met   PT SHORT TERM GOAL #2   Title Pt will improve gait speed to 2.83 ft/sec to improve functional ambulation level to community ambulator.   (Target 03/03/15)   Baseline 2.00 ft/sec on 03/04/15, feel that this is due to increased tone.    Status Not Met   PT SHORT TERM GOAL #3   Title Pt will ambulate >500' on indoor and outdoor surfaces including grass, inclines and curb step without AD with AFO at S level without overt LOB to indicate safe negotiation of indoor and outdoor surfaces.  (Target 03/03/15)   Baseline met on 03/03/15, however recommend use of cane at home for increased  safety and to decrease tone with gait.     Status Achieved   PT SHORT TERM GOAL #4   Title Pt will negotiate up/down 4 steps in step to pattern with single rail at mod I level with safe technique to traverse community obstacles.   (Target 03/03/15)   Baseline Met on 9/22.   Status Achieved   PT SHORT TERM GOAL #5   Title Pt will improve 3MWT to 498' to improve ambulation function and functional endurance.    Baseline 366' on 03/03/15   Status Not Met           PT Long Term Goals - 05/13/15 1931    PT LONG TERM GOAL #1   Title Pts SIS mobility score will improve to 80% to indicate improved perceived functional mobility.  (Modified Target 05/15/15)  Target dates updated due to missing 2 wks of PT   Baseline 66.7% on 8/31   Time 8   Period Weeks   Status On-going   PT LONG TERM GOAL #2   Title Pt will improve ankle DF strength to 3/5 in order to increase safety with household ambulation and begin to address ambulation without use of AFO.  (Modified Target 05/15/15)   Baseline still with 3-/5 05/11/15   Time 8   Period Weeks   Status On-going   PT LONG TERM GOAL #3   Title Pt will verbalize ability to return to leisure activities to indicate return to community function.   (Modified Target 05/15/15)   Baseline met 05/04/15 (went fishing over weekend with father)   Time 8   Period Weeks   Status Achieved   PT LONG TERM GOAL #4   Title Pt will negotiate up/down 4 steps without rail in alternating fashion to traverse community stairs while carrying object.  (Modified Target 05/15/15)   Baseline Met 11/9.   Time 8   Period Weeks   Status Achieved   PT LONG TERM GOAL #5   Title Pt will demonstrate ability to ambulate up to 150' with decreased R genu recurvatum to improve R knee stability/strength and prevent knee injury.  (Target Date: 04/16/15)   Baseline met 04/14/15   Time 8   Period Weeks   Status Achieved   Additional Long Term Goals   Additional Long Term Goals Yes   PT  LONG TERM GOAL #6   Title Pt will demonstrate ability to perform SLS x 5 secs on RLE to improve balance and awareness of RLE. (Modified Target Date: 06/10/15)   Baseline can tolerate up to 3 seconds with  RUE support 05/13/15   Time 8   Period Weeks   Status On-going   PT LONG TERM GOAL #7   Title Pt will ambulate on all outdoor surfaces without AD with ASO only in order to indicate improved gait pattern, increased RLE stability and safety in returning to more challenging leisure activities.  (Target Date: 06/10/15)   PT LONG TERM GOAL #8   Title Pt will demonstrate 3+/5 R hamstring strength with decreased hip/trunk compensation to indicate improved and more efficient gait pattern.  (Target Date: 06/10/15)               Plan - 05/20/15 2774    Clinical Impression Statement Skilled session focused on RLE stability and prolonged activation as well as working towards LTG of R SLS with RUE only support during session.  Note that he conitnues to demonstrate R glute med weakness vs apraxia when performing tasks.     Pt will benefit from skilled therapeutic intervention in order to improve on the following deficits Abnormal gait;Decreased balance;Decreased coordination;Decreased knowledge of precautions;Decreased safety awareness;Decreased strength;Difficulty walking;Impaired perceived functional ability;Impaired sensation;Impaired flexibility;Impaired tone;Impaired UE functional use;Postural dysfunction;Decreased knowledge of use of DME   Rehab Potential Excellent   PT Frequency 2x / week   PT Duration 4 weeks   PT Treatment/Interventions ADLs/Self Care Home Management;Electrical Stimulation;Biofeedback;DME Instruction;Gait training;Stair training;Functional mobility training;Therapeutic exercise;Balance training;Neuromuscular re-education;Patient/family education;Manual techniques;Visual/perceptual remediation/compensation   PT Next Visit Plan  R knee stability, stretching heel cord, isolated hip/knee  flex, RLE WB, RLE activation.  Continue progressing toward goals.  Assess gait with ASO only, check outdoors as well   PT Home Exercise Plan see pt instruction   Consulted and Agree with Plan of Care Patient        Problem List Patient Active Problem List   Diagnosis Date Noted  . Spastic hemiplegia affecting right dominant side ([place]) 04/13/2015  . 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 Left middle cerebral artery stroke ([place]) 10/21/2014  . Right hemiparesis ([place]) 10/21/2014  . Aphasia due to stroke 10/21/2014    Cameron Sprang, PT, MPT [facility] [address] [place], [place], [address] Phone: [phone]   Fax:  [phone] 05/20/2015, 11:59 AM  Name: Mario [name] MRN: [id number] Date of Birth: [birth year]

## 2015-05-25 ENCOUNTER — Ambulatory Visit: Payer: BC Managed Care – PPO | Admitting: Occupational Therapy

## 2015-05-25 ENCOUNTER — Encounter: Payer: Self-pay | Admitting: Physical Therapy

## 2015-05-25 ENCOUNTER — Encounter: Payer: Self-pay | Admitting: Occupational Therapy

## 2015-05-25 ENCOUNTER — Ambulatory Visit: Payer: BC Managed Care – PPO

## 2015-05-25 ENCOUNTER — Ambulatory Visit: Payer: BC Managed Care – PPO | Admitting: Physical Therapy

## 2015-05-25 DIAGNOSIS — M6281 Muscle weakness (generalized): Secondary | ICD-10-CM

## 2015-05-25 DIAGNOSIS — G8111 Spastic hemiplegia affecting right dominant side: Secondary | ICD-10-CM

## 2015-05-25 DIAGNOSIS — R482 Apraxia: Secondary | ICD-10-CM

## 2015-05-25 DIAGNOSIS — R4189 Other symptoms and signs involving cognitive functions and awareness: Secondary | ICD-10-CM

## 2015-05-25 DIAGNOSIS — IMO0002 Reserved for concepts with insufficient information to code with codable children: Secondary | ICD-10-CM

## 2015-05-25 DIAGNOSIS — R4701 Aphasia: Secondary | ICD-10-CM

## 2015-05-25 DIAGNOSIS — R201 Hypoesthesia of skin: Secondary | ICD-10-CM

## 2015-05-25 DIAGNOSIS — I69359 Hemiplegia and hemiparesis following cerebral infarction affecting unspecified side: Secondary | ICD-10-CM

## 2015-05-25 DIAGNOSIS — R531 Weakness: Secondary | ICD-10-CM

## 2015-05-25 DIAGNOSIS — R2681 Unsteadiness on feet: Secondary | ICD-10-CM

## 2015-05-25 DIAGNOSIS — R269 Unspecified abnormalities of gait and mobility: Secondary | ICD-10-CM

## 2015-05-25 NOTE — Therapy (Signed)
[facility] [address] [place], [place], [address] Phone: [phone]   Fax:  [phone]  Physical Therapy Treatment  Patient Details  Name: Mario [name] MRN: [id number] Date of Birth: 16-Mar-1976 No Data Recorded  Encounter Date: 05/25/2015   05/25/15 0934  PT Visits / Re-Eval  Visit Number 26  Number of Visits 33  Date for PT Re-Evaluation 06/10/15  PT Time Calculation  PT Start Time 0931  PT Stop Time 1015  PT Time Calculation (min) 44 min  PT - End of Session  Activity Tolerance Patient tolerated treatment well  Behavior During Therapy [facility] for tasks assessed/performed     Past Medical History  Diagnosis Date  . Hypertension     Past Surgical History  Procedure Laterality Date  . No past surgeries      There were no vitals filed for this visit.  Visit Diagnosis:   Abnormality of gait   .  Unsteadiness    Muscle weakness     Generalized weakness     Hemiparesis following cerebrovascular accident ([place])     Weakness due to cerebrovascular accident     Expressive aphasia     Receptive aphasia        Subjective Assessment - 05/25/15 0934    Subjective No new complaints. No pain or falls to report.   Currently in Pain? No/denies     treatment: Gait with right ankle aso only: 230 feet x 2 with supervision. Cues on stride length, foot/step placement, right foot clearance, increase right knee/hip flexion and knee control with stance on right.    Neuro re-ed: Prone - resisted hamstring curls with manual overpressure to pelvis to prevent compensatory motions at pelvis/quadratul lumborum and emphasize hamstring activation  - glut kick backs with manual stability/overpressure to keep pelvis in neural position  - heel squeezes, assist to maintain bil knee flexion during squeezes, 5 sec hold, 2 sets of 10. Emphasis placed on neutral pelvis and no compensatory motions   supine with right  leg off mat and foot resting on 4 inch box - single leg bridge (left leg in full extension on mat), 2 sets of 10 reps - hip flexion off/onto mat with emphasis on full lift, no dragging of foot and on maintaining knee flexion throughout  at stairs: single leg stance activites left stance: right foot taps up/down bottom 2 stairs with emphasis on lifting foot, not dragging foot. Cues/assist to maintain upright posture and not lean/use compensatory movements.  Right stance: left foot taps up/down bottom 3 stairs with emphasis on right knee stability and posture as well.         PT Short Term Goals - 03/04/15 1035    PT SHORT TERM GOAL #1   Title Pt will improve BERG balance score to 56/56 to decrease fall risk and improve functional mobility. (Target 03/03/15)   Baseline 55/56 on 03/04/15   Status Not Met   PT SHORT TERM GOAL #2   Title Pt will improve gait speed to 2.83 ft/sec to improve functional ambulation level to community ambulator.   (Target 03/03/15)   Baseline 2.00 ft/sec on 03/04/15, feel that this is due to increased tone.    Status Not Met   PT SHORT TERM GOAL #3   Title Pt will ambulate >500' on indoor and outdoor surfaces including grass, inclines and curb step without AD with AFO at S level without overt LOB to indicate safe negotiation of indoor and outdoor surfaces.  (  Target 03/03/15)   Baseline met on 03/03/15, however recommend use of cane at home for increased safety and to decrease tone with gait.     Status Achieved   PT SHORT TERM GOAL #4   Title Pt will negotiate up/down 4 steps in step to pattern with single rail at mod I level with safe technique to traverse community obstacles.   (Target 03/03/15)   Baseline Met on 9/22.   Status Achieved   PT SHORT TERM GOAL #5   Title Pt will improve 3MWT to 498' to improve ambulation function and functional endurance.    Baseline 366' on 03/03/15   Status Not Met           PT Long Term Goals - 05/13/15 1931    PT LONG TERM  GOAL #1   Title Pts SIS mobility score will improve to 80% to indicate improved perceived functional mobility.  (Modified Target 05/15/15)  Target dates updated due to missing 2 wks of PT   Baseline 66.7% on 8/31   Time 8   Period Weeks   Status On-going   PT LONG TERM GOAL #2   Title Pt will improve ankle DF strength to 3/5 in order to increase safety with household ambulation and begin to address ambulation without use of AFO.  (Modified Target 05/15/15)   Baseline still with 3-/5 05/11/15   Time 8   Period Weeks   Status On-going   PT LONG TERM GOAL #3   Title Pt will verbalize ability to return to leisure activities to indicate return to community function.   (Modified Target 05/15/15)   Baseline met 05/04/15 (went fishing over weekend with father)   Time 8   Period Weeks   Status Achieved   PT LONG TERM GOAL #4   Title Pt will negotiate up/down 4 steps without rail in alternating fashion to traverse community stairs while carrying object.  (Modified Target 05/15/15)   Baseline Met 11/9.   Time 8   Period Weeks   Status Achieved   PT LONG TERM GOAL #5   Title Pt will demonstrate ability to ambulate up to 150' with decreased R genu recurvatum to improve R knee stability/strength and prevent knee injury.  (Target Date: 04/16/15)   Baseline met 04/14/15   Time 8   Period Weeks   Status Achieved   Additional Long Term Goals   Additional Long Term Goals Yes   PT LONG TERM GOAL #6   Title Pt will demonstrate ability to perform SLS x 5 secs on RLE to improve balance and awareness of RLE. (Modified Target Date: 06/10/15)   Baseline can tolerate up to 3 seconds with RUE support 05/13/15   Time 8   Period Weeks   Status On-going   PT LONG TERM GOAL #7   Title Pt will ambulate on all outdoor surfaces without AD with ASO only in order to indicate improved gait pattern, increased RLE stability and safety in returning to more challenging leisure activities.  (Target Date: 06/10/15)   PT LONG  TERM GOAL #8   Title Pt will demonstrate 3+/5 R hamstring strength with decreased hip/trunk compensation to indicate improved and more efficient gait pattern.  (Target Date: 06/10/15)        05/25/15 0934  Plan  Clinical Impression Statement Focued on strengthening and gait this session with only fatigue and soreness reported after session, no pain. Pt is making steady progress toward goals. Was able to isolate glut med with exercises  performed today with visable weakness noted/muscle tremors.                                   Pt will benefit from skilled therapeutic intervention in order to improve on the following deficits Abnormal gait;Decreased balance;Decreased coordination;Decreased knowledge of precautions;Decreased safety awareness;Decreased strength;Difficulty walking;Impaired perceived functional ability;Impaired sensation;Impaired flexibility;Impaired tone;Impaired UE functional use;Postural dysfunction;Decreased knowledge of use of DME  Rehab Potential Excellent  PT Frequency 2x / week  PT Duration 4 weeks  PT Treatment/Interventions ADLs/Self Care Home Management;Electrical Stimulation;Biofeedback;DME Instruction;Gait training;Stair training;Functional mobility training;Therapeutic exercise;Balance training;Neuromuscular re-education;Patient/family education;Manual techniques;Visual/perceptual remediation/compensation  PT Next Visit Plan R knee stability, stretching heel cord, isolated hip/knee flex, RLE WB, RLE activation.  Continue progressing toward goals.  Assess gait with ASO only, check outdoors as well  PT Home Exercise Plan see pt instruction  Consulted and Agree with Plan of Care Patient     Problem List Patient Active Problem List   Diagnosis Date Noted  . Spastic hemiplegia affecting right dominant side ([place]) 04/13/2015  . 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 Left middle cerebral artery stroke ([place]) 10/21/2014  . Right hemiparesis ([place])  10/21/2014  . Aphasia due to stroke 10/21/2014    Willow Ora 05/25/2015, 9:35 AM  Willow Ora, PTA, [facility] [address], [place], [place] [address] [phone] 05/27/2015, 9:20 AM   Name: Mario [name] MRN: [id number] Date of Birth: 03/27/1976

## 2015-05-25 NOTE — Therapy (Signed)
[facility] [address] [place], [place], [address] Phone: [phone]   Fax:  [phone]  Speech Language Pathology Treatment  Patient Details  Name: Mario [name] MRN: [id number] Date of Birth: 1975/09/06 No Data Recorded  Encounter Date: 05/25/2015      End of Session - 05/25/15 0843    Visit Number 30   Number of Visits 33   Date for SLP Re-Evaluation 06/04/15   SLP Start Time 0804   SLP Stop Time  0845   SLP Time Calculation (min) 41 min   Activity Tolerance Patient tolerated treatment well      Past Medical History  Diagnosis Date  . Hypertension     Past Surgical History  Procedure Laterality Date  . No past surgeries      There were no vitals filed for this visit.  Visit Diagnosis: Expressive aphasia  Verbal apraxia  Receptive aphasia      Subjective Assessment - 05/25/15 0807    Subjective "Hey - (unintelligible)"   Currently in Pain? No/denies               ADULT SLP TREATMENT - 05/25/15 0825    General Information   Behavior/Cognition Alert;Cooperative;Pleasant mood   Treatment Provided   Treatment provided Cognitive-Linquistic   Cognitive-Linquistic Treatment   Treatment focused on Aphasia;Apraxia   Skilled Treatment Pt reports with SLP questioning cues that assistive tech paperwork was sent/faxed last week. Simple to mod complex conversation resulted today in SLP assisting pt's multimodal communication with yes/no questions as well as pt writing and speaking - 80% success. Multiple attempts with verbal and written means were allowed but pt successful within 3 attempts with either mode using SLP questioning cues. In mod complex conversation pt req'd mod questioning cues from SLP to communicate message 90% success.    Assessment / Recommendations / Plan   Plan Continue with current plan of care   Progression Toward Goals   Progression toward goals Progressing toward goals             SLP Short Term Goals - 05/18/15 1147    SLP SHORT TERM GOAL #6   Title pt will provide functional multimodal responses (except writing) in simple conversation with occasional min A   Baseline for 1 weeks or 3 more visits, for all STGs   Time 1   Period Weeks   Status Not Met   SLP SHORT TERM GOAL #7   Title pt will demo understanding of mod complex conversation with repeats allowed 90%   Time 1   Period Weeks   Status Not Met   SLP SHORT TERM GOAL #8   Title pt will write one word responses to communicate functionally, with multiple attempts allowed, achieving 75% listener comprehension   Status Achieved          SLP Long Term Goals - 05/25/15 0817    SLP LONG TERM GOAL #1   Title pt will demo undertanding of simple/mod complex conversational questions via yes/no responses 80% accuracy   Period --  or 16 visits - for all LTGs   Status Achieved   SLP LONG TERM GOAL #2   Title pt will demo undertanding of simple conversational quesitons via yes/no responses 90% accuracy   Status Achieved   SLP LONG TERM GOAL #3   Title pt will perform rote speech tasks simultaneously with SLP with 40% accuracy (functional responses allowed)   Status Not Met   SLP LONG TERM  GOAL #4   Title pt will imitate salient words/family names with 20% success   Status Achieved   SLP LONG TERM GOAL #5   Title pt will demo use of simple alternative/augmentative communcation and/or multimodal communication with 80% success in mod complex message conveyance   Baseline 5 weeks or 7 more visits   Time 1   Period Weeks   Status Achieved   SLP LONG TERM GOAL #6   Title pt will state family names/information and other salient personal 2-3 word information with 80% success   Baseline 5 weeks or 7 more visits   Time 5   Period Weeks   Status On-going          Plan - 05/25/15 0844    Clinical Impression Statement In simple word naming tasks, pt is functionally communicating. Simple  conversation is functional with extra time, multimodal communciation by pt, and some cues from SLP/listener. Mod complex conversation requires mod cues from SLP/listener and extra time. Pt's comprehension is functional for mod ocmpelx conversation with rare repeats and slowed rate by speaker. Skilled ST for one more session to ensure consistency with language skills.   Speech Therapy Frequency 2x / week   Duration 1 week   Treatment/Interventions SLP instruction and feedback;Compensatory strategies;Internal/external aids;Environmental controls;Patient/family education;Functional tasks;Cueing hierarchy;Multimodal communcation approach   Potential to Achieve Goals Fair   Potential Considerations Severity of impairments        Problem List Patient Active Problem List   Diagnosis Date Noted  . Spastic hemiplegia affecting right dominant side ([place]) 04/13/2015  . 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 Left middle cerebral artery stroke ([place]) 10/21/2014  . Right hemiparesis ([place]) 10/21/2014  . Aphasia due to stroke 10/21/2014    [facility] , MS, CCC-SLP  05/25/2015, 8:47 AM  [facility] [address] [place], [place], [address] Phone: [phone]   Fax:  [phone]   Name: Mario [name] MRN: [id number] Date of Birth: 10/14/1975
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
## 2015-05-27 ENCOUNTER — Ambulatory Visit: Payer: BC Managed Care – PPO | Admitting: Rehabilitation

## 2015-05-27 ENCOUNTER — Ambulatory Visit: Payer: BC Managed Care – PPO | Admitting: Occupational Therapy

## 2015-05-27 ENCOUNTER — Ambulatory Visit: Payer: BC Managed Care – PPO

## 2015-06-01 ENCOUNTER — Ambulatory Visit: Payer: BC Managed Care – PPO | Admitting: *Deleted

## 2015-06-01 ENCOUNTER — Ambulatory Visit: Payer: BC Managed Care – PPO | Admitting: Physical Therapy

## 2015-06-01 DIAGNOSIS — R2681 Unsteadiness on feet: Secondary | ICD-10-CM

## 2015-06-01 DIAGNOSIS — I69359 Hemiplegia and hemiparesis following cerebral infarction affecting unspecified side: Secondary | ICD-10-CM

## 2015-06-01 DIAGNOSIS — G8111 Spastic hemiplegia affecting right dominant side: Secondary | ICD-10-CM | POA: Diagnosis not present

## 2015-06-01 DIAGNOSIS — R269 Unspecified abnormalities of gait and mobility: Secondary | ICD-10-CM

## 2015-06-01 NOTE — Therapy (Signed)
[facility] [address] [place], [place], [address] Phone: [phone]   Fax:  [phone]  Physical Therapy Treatment  Patient Details  Name: Mario [name] MRN: [id number] Date of Birth: 05-08-1976 No Data Recorded  Encounter Date: 06/01/2015      PT End of Session - 06/01/15 0906    Visit Number 27   Number of Visits 33   Date for PT Re-Evaluation 06/10/15   PT Start Time 0759   PT Stop Time 0848   PT Time Calculation (min) 49 min   Activity Tolerance Patient tolerated treatment well   Behavior During Therapy [facility] for tasks assessed/performed      Past Medical History  Diagnosis Date  . Hypertension     Past Surgical History  Procedure Laterality Date  . No past surgeries      There were no vitals filed for this visit.  Visit Diagnosis:  Abnormality of gait  Unsteadiness  Hemiparesis following cerebrovascular accident [facility])      Subjective Assessment - 06/01/15 0759    Subjective Pt denies pain or significant changes. When asked if pt performing R ankle stretches at home, pt nodded head "yes".    Patient is accompained by: --   Limitations Walking;House hold activities;Reading;Standing;Writing   Patient Stated Goals Improve speech, be able to walk better, and get functional use of RUE.    Currently in Pain? No/denies                         [facility] Adult PT Treatment/Exercise - 06/01/15 0001    Ambulation/Gait   Gait Comments Gait x250' over unlevel, paved surfaces (distance limited by onset of rain while ambulating outdoors) with R ASO only and no AD with supervision due to consistent R foot catch (lateral aspect of R forefoot) due to excessive R forefoot supination during RLE advancement. Utilized NMES at R peroneal muscle group for increased R ankle eversion during RLE advancement; also initially placed NMES at R tibialis anterior; however, noted excessive forefoot supination  and therefore removed NMES from tibialis anterior and focused on peroneals with improved forefoot positioning noted. With NMES as described and mirror to R of pt for visual feedback, performed blocked practice of gait training on treadmill at speed .4 to .6 with tactile cueing for increased forefoot pronation during RLE advancement, resistance cueing to promote R hip/knee flexion during RLE advancement. Following gait training on treadmill, pt able to inconsistently advance RLE with concurrent RLE eversion and consistent RLE clearance (no ASO utilized).    Neuro Re-ed    Neuro Re-ed Details  Prolonged passive stretch of R ankle plantarflexors 3 x1-minute holds followed by standing, self-stretch of R gastrocnemius using stair x60-seconds for spasticity management, to promote more normalized gait pattern. Supine, RLE PNF rhythmic initiaion of R D2 flexion x20 reps with emphasis on selective control of R ankle DF/eversion.                PT Education - 06/01/15 0858    Education provided Yes   Education Details Recommended pt position R AFO under shoe insole to maximize benefit of brace.   Person(s) Educated Patient   Methods Explanation;Demonstration   Comprehension Verbalized understanding          PT Short Term Goals - 03/04/15 1035    PT SHORT TERM GOAL #1   Title Pt will improve BERG balance score to 56/56 to decrease fall risk and improve  functional mobility. (Target 03/03/15)   Baseline 55/56 on 03/04/15   Status Not Met   PT SHORT TERM GOAL #2   Title Pt will improve gait speed to 2.83 ft/sec to improve functional ambulation level to community ambulator.   (Target 03/03/15)   Baseline 2.00 ft/sec on 03/04/15, feel that this is due to increased tone.    Status Not Met   PT SHORT TERM GOAL #3   Title Pt will ambulate >500' on indoor and outdoor surfaces including grass, inclines and curb step without AD with AFO at S level without overt LOB to indicate safe negotiation of indoor and  outdoor surfaces.  (Target 03/03/15)   Baseline met on 03/03/15, however recommend use of cane at home for increased safety and to decrease tone with gait.     Status Achieved   PT SHORT TERM GOAL #4   Title Pt will negotiate up/down 4 steps in step to pattern with single rail at mod I level with safe technique to traverse community obstacles.   (Target 03/03/15)   Baseline Met on 9/22.   Status Achieved   PT SHORT TERM GOAL #5   Title Pt will improve 3MWT to 498' to improve ambulation function and functional endurance.    Baseline 366' on 03/03/15   Status Not Met           PT Long Term Goals - 06/01/15 0910    PT LONG TERM GOAL #1   Title Pts SIS mobility score will improve to 80% to indicate improved perceived functional mobility.  (Modified Target 05/15/15)  Target dates updated due to missing 2 wks of PT   Baseline 66.7% on 8/31   Time 8   Period Weeks   Status On-going   PT LONG TERM GOAL #2   Title Pt will improve ankle DF strength to 3/5 in order to increase safety with household ambulation and begin to address ambulation without use of AFO.  (Modified Target 05/15/15)   Baseline still with 3-/5 05/11/15   Time 8   Period Weeks   Status On-going   PT LONG TERM GOAL #3   Title Pt will verbalize ability to return to leisure activities to indicate return to community function.   (Modified Target 05/15/15)   Baseline met 05/04/15 (went fishing over weekend with father)   Time 8   Period Weeks   Status Achieved   PT LONG TERM GOAL #4   Title Pt will negotiate up/down 4 steps without rail in alternating fashion to traverse community stairs while carrying object.  (Modified Target 05/15/15)   Baseline Met 11/9.   Time 8   Period Weeks   Status Achieved   PT LONG TERM GOAL #5   Title Pt will demonstrate ability to ambulate up to 150' with decreased R genu recurvatum to improve R knee stability/strength and prevent knee injury.  (Target Date: 04/16/15)   Baseline met 04/14/15    Time 8   Period Weeks   Status Achieved   PT LONG TERM GOAL #6   Title Pt will demonstrate ability to perform SLS x 5 secs on RLE to improve balance and awareness of RLE. (Modified Target Date: 06/10/15)   Baseline can tolerate up to 3 seconds with RUE support 05/13/15   Time 8   Period Weeks   Status On-going   PT LONG TERM GOAL #7   Title Pt will ambulate on all outdoor surfaces without AD with ASO only in order to indicate improved  gait pattern, increased RLE stability and safety in returning to more challenging leisure activities.  (Target Date: 06/10/15)   Baseline 12/27: Gait x250' (limited by rain) over unlevel, paved surfaces with ASO only requiring supervision due to consistent R toe drag during RLE advancement.   Status Not Met   PT LONG TERM GOAL #8   Title Pt will demonstrate 3+/5 R hamstring strength with decreased hip/trunk compensation to indicate improved and more efficient gait pattern.  (Target Date: 06/10/15)               Plan - 06/01/15 0907    Clinical Impression Statement Session focused on promoting activation of R peroneals to address RLE extensor synergy, facilitate consistent RLE clearance dutring gait. With stretching, NMR, and NMES, pt does have ability to functionally activate R peroneals to consistently clear RLE during gait; however, eversion often overpowered by extensor tone causing R forefoot supination.    Pt will benefit from skilled therapeutic intervention in order to improve on the following deficits Abnormal gait;Decreased balance;Decreased coordination;Decreased knowledge of precautions;Decreased safety awareness;Decreased strength;Difficulty walking;Impaired perceived functional ability;Impaired sensation;Impaired flexibility;Impaired tone;Impaired UE functional use;Postural dysfunction;Decreased knowledge of use of DME;Pain   Rehab Potential Excellent   Clinical Impairments Affecting Rehab Potential adhering to safety precautions   PT Frequency 2x /  week   PT Duration 4 weeks   PT Treatment/Interventions ADLs/Self Care Home Management;Electrical Stimulation;Biofeedback;DME Instruction;Gait training;Stair training;Functional mobility training;Therapeutic exercise;Balance training;Neuromuscular re-education;Patient/family education;Manual techniques;Visual/perceptual remediation/compensation   PT Next Visit Plan  R knee stability, stretching heel cord, isolated hip/knee flex, RLE WB, RLE activation.     Consulted and Agree with Plan of Care Patient        Problem List Patient Active Problem List   Diagnosis Date Noted  . Spastic hemiplegia affecting right dominant side ([place]) 04/13/2015  . 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 Left middle cerebral artery stroke ([place]) 10/21/2014  . Right hemiparesis ([place]) 10/21/2014  . Aphasia due to stroke 10/21/2014   Billie Ruddy, PT, DPT [facility] [address] [place], [place], [address] Phone: [phone]   Fax:  [phone] 06/01/2015, 9:15 AM   Name: Mario [name] MRN: [id number] Date of Birth: 1975/10/30

## 2015-06-03 ENCOUNTER — Encounter: Payer: Self-pay | Admitting: Rehabilitation

## 2015-06-03 ENCOUNTER — Ambulatory Visit: Payer: BC Managed Care – PPO | Admitting: Occupational Therapy

## 2015-06-03 ENCOUNTER — Encounter: Payer: Self-pay | Admitting: Occupational Therapy

## 2015-06-03 ENCOUNTER — Ambulatory Visit: Payer: BC Managed Care – PPO | Admitting: Rehabilitation

## 2015-06-03 DIAGNOSIS — R2681 Unsteadiness on feet: Secondary | ICD-10-CM

## 2015-06-03 DIAGNOSIS — M6281 Muscle weakness (generalized): Secondary | ICD-10-CM

## 2015-06-03 DIAGNOSIS — R201 Hypoesthesia of skin: Secondary | ICD-10-CM

## 2015-06-03 DIAGNOSIS — R269 Unspecified abnormalities of gait and mobility: Secondary | ICD-10-CM

## 2015-06-03 DIAGNOSIS — G8111 Spastic hemiplegia affecting right dominant side: Secondary | ICD-10-CM | POA: Diagnosis not present

## 2015-06-03 DIAGNOSIS — I69359 Hemiplegia and hemiparesis following cerebral infarction affecting unspecified side: Secondary | ICD-10-CM

## 2015-06-03 DIAGNOSIS — R4189 Other symptoms and signs involving cognitive functions and awareness: Secondary | ICD-10-CM

## 2015-06-03 NOTE — Therapy (Signed)
[facility] [address] [place], [place], [address] Phone: [phone]   Fax:  [phone]  Physical Therapy Treatment  Patient Details  Name: Mario [name] MRN: [id number] Date of Birth: 09-16-1975 No Data Recorded  Encounter Date: 06/03/2015      PT End of Session - 06/03/15 0936    Visit Number 28   Number of Visits 33   Date for PT Re-Evaluation 06/24/15  per new POC   PT Start Time 0933   PT Stop Time 1017   PT Time Calculation (min) 44 min   Activity Tolerance Patient tolerated treatment well   Behavior During Therapy [facility] for tasks assessed/performed      Past Medical History  Diagnosis Date  . Hypertension     Past Surgical History  Procedure Laterality Date  . No past surgeries      There were no vitals filed for this visit.  Visit Diagnosis:  Abnormality of gait - Plan: PT plan of care cert/re-cert  Unsteadiness - Plan: PT plan of care cert/re-cert  Hemiparesis following cerebrovascular accident [facility]) - Plan: PT plan of care cert/re-cert  Muscle weakness - Plan: PT plan of care cert/re-cert      Subjective Assessment - 06/03/15 0936    Subjective Pt denies pain, no changes since last visit.  No falls.    Limitations Walking;House hold activities;Reading;Standing;Writing   Patient Stated Goals Improve speech, be able to walk better, and get functional use of RUE.    Currently in Pain? No/denies              Gait:  Addressed LTG of gait on outdoor surfaces as in last visit, however had pt don R reaction AFO with supination strap to provide increased support during more uneven terrain. Pt at mod I level for grass and paved surfaces, however requires S for more challenging outdoor surfaces (up/down hill) and therefore recommended that he has S at this time for task.  Will continue to work towards goal.  Performed >700' today at S to mod I level.    NMR: Remainder of session focused  on isolated hip/knee flexion with improved foot positioning (decreased supination) during swing phase of gait.  Began with supine PNF pattern to increase hip/knee flexion (resisted in D2 flexion pattern) with assist for increased R ankle eversion.  Note improvement within task, however pt unable to attain more eversion while performing hip/knee flexion without getting RLE into flexed position then performing eversion.  Performed x 20 reps.  Progressed to standing tapping R heel to 6" step x 20 reps with use of mirror for increased visual feedback for posture and correct R foot placement.  Assist provided for proper foot alignment, however faded to pt being able to more control foot placement.  Ended session with gait forwards and backwards x 30' increments x 3 reps with use of mirror with focus on very slow gait speed and increased stride length to increase time spent in R SLS and focus on putting hip/knee/foot control into practice with gait.  Requires min A for foot placement, but did demonstrate activation of peroneals intermittently during task.                     PT Education - 06/03/15 0936    Education provided Yes   Education Details Recommend S with use of AFO for uneven outdoor terrain   Person(s) Educated Patient   Methods Explanation   Comprehension Verbalized understanding  PT Short Term Goals - 03/04/15 1035    PT SHORT TERM GOAL #1   Title Pt will improve BERG balance score to 56/56 to decrease fall risk and improve functional mobility. (Target 03/03/15)   Baseline 55/56 on 03/04/15   Status Not Met   PT SHORT TERM GOAL #2   Title Pt will improve gait speed to 2.83 ft/sec to improve functional ambulation level to community ambulator.   (Target 03/03/15)   Baseline 2.00 ft/sec on 03/04/15, feel that this is due to increased tone.    Status Not Met   PT SHORT TERM GOAL #3   Title Pt will ambulate >500' on indoor and outdoor surfaces including grass, inclines  and curb step without AD with AFO at S level without overt LOB to indicate safe negotiation of indoor and outdoor surfaces.  (Target 03/03/15)   Baseline met on 03/03/15, however recommend use of cane at home for increased safety and to decrease tone with gait.     Status Achieved   PT SHORT TERM GOAL #4   Title Pt will negotiate up/down 4 steps in step to pattern with single rail at mod I level with safe technique to traverse community obstacles.   (Target 03/03/15)   Baseline Met on 9/22.   Status Achieved   PT SHORT TERM GOAL #5   Title Pt will improve 3MWT to 498' to improve ambulation function and functional endurance.    Baseline 366' on 03/03/15   Status Not Met           PT Long Term Goals - 06/03/15 1240    PT LONG TERM GOAL #1   Title Pts SIS mobility score will improve to 80% to indicate improved perceived functional mobility.  (Modified Target 06/24/15)  Target dates updated due to missing 2 wks of PT   Baseline 66.7% on 8/31   Time 8   Period Weeks   Status On-going   PT LONG TERM GOAL #2   Title Pt will improve ankle DF strength to 3/5 in order to increase safety with household ambulation and begin to address ambulation without use of AFO.  (Modified Target 06/24/15)   Baseline still with 3-/5 05/11/15   Time 8   Period Weeks   Status On-going   PT LONG TERM GOAL #3   Title Pt will verbalize ability to return to leisure activities to indicate return to community function.   (Modified Target 05/15/15)   Baseline met 05/04/15 (went fishing over weekend with father)   Time 8   Period Weeks   Status Achieved   PT LONG TERM GOAL #4   Title Pt will negotiate up/down 4 steps without rail in alternating fashion to traverse community stairs while carrying object.  (Modified Target 05/15/15)   Baseline Met 11/9.   Time 8   Period Weeks   Status Achieved   PT LONG TERM GOAL #5   Title Pt will demonstrate ability to ambulate up to 150' with decreased R genu recurvatum to  improve R knee stability/strength and prevent knee injury.  (Target Date: 04/16/15)   Baseline met 04/14/15   Time 8   Period Weeks   Status Achieved   PT LONG TERM GOAL #6   Title Pt will demonstrate ability to perform SLS x 5 secs on RLE to improve balance and awareness of RLE. (Modified Target Date: 06/24/15)   Baseline can tolerate up to 3 seconds with RUE support 05/13/15   Time 8   Period  Weeks   Status On-going   PT LONG TERM GOAL #7   Title Pt will ambulate on all outdoor surfaces without AD with AFO at mod I level in order to indicate improved gait pattern, increased RLE stability and safety in returning to more challenging leisure activities.  (Target Date: 06/24/15)   Baseline 12/27: Gait x250' (limited by rain) over unlevel, paved surfaces with ASO only requiring supervision due to consistent R toe drag during RLE advancement. S in AFO on 06/03/15   Status Not Met   PT LONG TERM GOAL #8   Title Pt will demonstrate 3+/5 R hamstring strength with decreased hip/trunk compensation to indicate improved and more efficient gait pattern.  (Target Date: 06/24/15)               Plan - 06/03/15 1228    Clinical Impression Statement Skilled session focused on addressing remaining LTG's as well as more isolated hip/knee flexion with controlled neutral ankle placement during swing phase of gait.  Discussed adding two additional visits to meet POC.  Will adjust goals as needed.     Pt will benefit from skilled therapeutic intervention in order to improve on the following deficits Abnormal gait;Decreased balance;Decreased coordination;Decreased knowledge of precautions;Decreased safety awareness;Decreased strength;Difficulty walking;Impaired perceived functional ability;Impaired sensation;Impaired flexibility;Impaired tone;Impaired UE functional use;Postural dysfunction;Decreased knowledge of use of DME;Pain   Rehab Potential Excellent   Clinical Impairments Affecting Rehab Potential adhering  to safety precautions   PT Frequency 2x / week   PT Duration Other (comment)  until 06/24/15   PT Treatment/Interventions ADLs/Self Care Home Management;Electrical Stimulation;Biofeedback;DME Instruction;Gait training;Stair training;Functional mobility training;Therapeutic exercise;Balance training;Neuromuscular re-education;Patient/family education;Manual techniques;Visual/perceptual remediation/compensation   PT Next Visit Plan  R knee stability, stretching heel cord, isolated hip/knee flex, RLE WB, RLE activation.  Continue to address LTG's.    Consulted and Agree with Plan of Care Patient        Problem List Patient Active Problem List   Diagnosis Date Noted  . Spastic hemiplegia affecting right dominant side ([place]) 04/13/2015  . 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 Left middle cerebral artery stroke ([place]) 10/21/2014  . Right hemiparesis ([place]) 10/21/2014  . Aphasia due to stroke 10/21/2014    Cameron Sprang, PT, MPT [facility] [address] [place], [place], [address] Phone: [phone]   Fax:  [phone] 06/03/2015, 12:44 PM  Name: Mario [name] MRN: [id number] Date of Birth: 08/07/1975

## 2015-06-03 NOTE — Therapy (Signed)
[facility] [address] [place], [place], [address] Phone: [phone]   Fax:  [phone]  Occupational Therapy Treatment  Patient Details  Name: Mario [name] MRN: [id number] Date of Birth: 11/08/1975 Referring Provider: Rochel Brome  Encounter Date: 06/03/2015      OT End of Session - 06/03/15 1001    Visit Number 30   Number of Visits 32   Date for OT Re-Evaluation 06/03/15   Authorization Type BC[employer] state heatlh PPO - pt's mother verifying benefits for 2017. Current plan has no visit limitation.    OT Start Time 0845   OT Stop Time 0931   OT Time Calculation (min) 46 min   Activity Tolerance Patient tolerated treatment well      Past Medical History  Diagnosis Date  . Hypertension     Past Surgical History  Procedure Laterality Date  . No past surgeries      There were no vitals filed for this visit.  Visit Diagnosis:  Spastic hemiplegia affecting right dominant side ([place]) - Plan: Ot plan of care cert/re-cert  Impaired cognition - Plan: Ot plan of care cert/re-cert  Impaired sensation - Plan: Ot plan of care cert/re-cert  Muscle weakness - Plan: Ot plan of care cert/re-cert      Subjective Assessment - 06/03/15 0852    Pertinent History See epic snaphshot, L MCA CVA, HTN   Patient Stated Goals Per father, speech, RUE and walking to improve   Currently in Pain? No/denies                      OT Treatments/Exercises (OP) - 06/03/15 0001    ADLs   ADL Comments Disccusesd with pt and mom plan to renew pt for OT as pt continues to make excellent progress.  Mom to call and verify that insurance benefits have not changed with the new year. (2017).     Neurological Re-education Exercises   Other Exercises 1 Neuro re ed after estim. to address functional hand use incorporating finger extension with wrist extension, emphasis on hand orientation and using active wrist extension to  position hand functionally for task, as well as grading grip to allow more isolated active release of objects in task.  Pt requires min facilitation to assist with weight of the arm as this increases flexor tone in arm and hand as well as intermittent techiques to decrease tone in hand as pt works on task. Pt also requires cues to fully use isolated movement and to "reach with hand " vs reaching with shoulder.    Acupuncturist Location R fingers and wrist   Electrical Stimulation Action finger and wrist extension   Electrical Stimulation Parameters 50 pps, 250 pulse width, intensity 15 x10 minutes, tolerated well.  Followed by neuro re ed for functional tasks.                  OT Short Term Goals - 06/03/15 0948    OT SHORT TERM GOAL #5   Status --  pt able to use as gross assist for some parts of ADL activity   Additional Short Term Goals   Additional Short Term Goals Yes   OT SHORT TERM GOAL #6   Title Pt will be mod I with upgraded HEP - 05/06/2015 (dated adjusted as pt missed one week of OT)   Status Achieved   OT SHORT TERM GOAL #7   Title Pt will  demonstrate ability to cast fishing pole with RUE with no more than mod assist   Status Achieved   OT SHORT TERM GOAL #8   Title Pt will be able to use RUE as stabilizer during hot meal prep   Status Achieved   OT SHORT TERM GOAL  #9   TITLE Pt will demonstrate ability to pick up and place light object at 90* of reach with RUE with min compensations.   Status Achieved   OT SHORT TERM GOAL  #10   TITLE Pt will be mod I with upgraded HEP - 07/01/2015   Status New   OT SHORT TERM GOAL  #11   TITLE Pt will be able to use R hand as non dominant assist in at least two functional tasks.    Status New   OT SHORT TERM GOAL  #12   TITLE Pt will be able to pick up and release small objects at table top activity with min compensations and vc's only.   Status New           OT Long Term Goals -  06/03/15 0093    Long Term Additional Goals   Additional Long Term Goals Yes   OT LONG TERM GOAL #6   Title --  moved to STG on 03/30/2015   OT LONG TERM GOAL #8   Title Pt will be mod I with upgraded HEP prn - 06/03/2015 (date adjusted as pt missed one week of OT)   Status Achieved   OT LONG TERM GOAL  #9   Baseline Pt will demonstrate ability to overhead reach to grasp light object off shelf with RUE with no more than min facilitation   Status Achieved   OT LONG TERM GOAL  #10   TITLE Pt will demostrate ability to use RUE as gross assist for 100% of basic self care tasks.   Status Achieved   OT LONG TERM GOAL  #11   TITLE Pt will be able to use RUE as gross assist during hot meal prep   Status Achieved   OT LONG TERM GOAL  #12   TITLE Pt will be mod I with upgraded HEP - 07/29/2015   Status New   OT LONG TERM GOAL  #13   TITLE Pt will demonstrate ability to use RUE as non dominant at least 50% in basic self care tasks.    Status New   OT LONG TERM GOAL  #14   TITLE Pt will demonstrate ability to pick up at least 2 pound object in higher cabinet with RUE with min compensations only.    Status New   OT LONG TERM GOAL  #15   TITLE Pt will be able to dial cell phone with R hand with increased time and no more than min assist.    Status New               Plan - 06/03/15 0959    Clinical Impression Statement Pt has made excellent progress and has met all STG and LTG's goals. Given that pt continues to demonstrate functional gains, discussed continued OT services and goals with pt and pt and family in agreement. See goal update.     Pt will benefit from skilled therapeutic intervention in order to improve on the following deficits (Retired) Abnormal gait;Decreased balance;Decreased coordination;Decreased cognition;Decreased knowledge of use of DME;Decreased mobility;Decreased range of motion;Decreased strength;Increased edema;Impaired UE functional use;Impaired tone;Impaired  sensation;Pain   Rehab Potential Good   Clinical Impairments  Affecting Rehab Potential global aphasia, apraxia, impaired sensation   OT Frequency 2x / week   OT Duration 8 weeks   OT Treatment/Interventions Self-care/ADL training;Ultrasound;Moist Heat;Electrical Stimulation;DME and/or AE instruction;Neuromuscular education;Therapeutic exercise;Functional Mobility Training;Manual Therapy;Passive range of motion;Splinting;Therapeutic activities;Balance training;Patient/family education;Cognitive remediation/compensation   Plan update HEP as needed, NMR for functional use, e stim, overhead reach.   Consulted and Agree with Plan of Care Patient        Problem List Patient Active Problem List   Diagnosis Date Noted  . Spastic hemiplegia affecting right dominant side ([place]) 04/13/2015  . 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 Left middle cerebral artery stroke ([place]) 10/21/2014  . Right hemiparesis ([place]) 10/21/2014  . Aphasia due to stroke 10/21/2014    Quay Burow, OTR/L 06/03/2015, 10:05 AM  [facility] [address] [place], [place], [address] Phone: [phone]   Fax:  [phone]  Name: Mario [name] MRN: [id number] Date of Birth: 19-Mar-1976

## 2015-06-08 ENCOUNTER — Ambulatory Visit: Payer: BC Managed Care – PPO

## 2015-06-08 ENCOUNTER — Ambulatory Visit: Payer: BC Managed Care – PPO | Attending: Family Medicine | Admitting: Rehabilitation

## 2015-06-08 DIAGNOSIS — R4189 Other symptoms and signs involving cognitive functions and awareness: Secondary | ICD-10-CM | POA: Insufficient documentation

## 2015-06-08 DIAGNOSIS — R201 Hypoesthesia of skin: Secondary | ICD-10-CM | POA: Diagnosis present

## 2015-06-08 DIAGNOSIS — I69898 Other sequelae of other cerebrovascular disease: Secondary | ICD-10-CM | POA: Diagnosis present

## 2015-06-08 DIAGNOSIS — R482 Apraxia: Secondary | ICD-10-CM

## 2015-06-08 DIAGNOSIS — M6281 Muscle weakness (generalized): Secondary | ICD-10-CM | POA: Insufficient documentation

## 2015-06-08 DIAGNOSIS — G8111 Spastic hemiplegia affecting right dominant side: Secondary | ICD-10-CM | POA: Insufficient documentation

## 2015-06-08 DIAGNOSIS — R269 Unspecified abnormalities of gait and mobility: Secondary | ICD-10-CM

## 2015-06-08 DIAGNOSIS — R4701 Aphasia: Secondary | ICD-10-CM | POA: Insufficient documentation

## 2015-06-08 DIAGNOSIS — R2681 Unsteadiness on feet: Secondary | ICD-10-CM | POA: Insufficient documentation

## 2015-06-08 DIAGNOSIS — I69359 Hemiplegia and hemiparesis following cerebral infarction affecting unspecified side: Secondary | ICD-10-CM | POA: Diagnosis present

## 2015-06-08 DIAGNOSIS — R531 Weakness: Secondary | ICD-10-CM | POA: Diagnosis present

## 2015-06-08 NOTE — Therapy (Signed)
[facility] [address] [place], [place], [address] Phone: [phone]   Fax:  [phone]  Speech Language Pathology Treatment  Patient Details  Name: Mario [name] MRN: [id number] Date of Birth: [birth year] No Data Recorded  Encounter Date: 06/08/2015      End of Session - 06/08/15 1606    Visit Number 31   Number of Visits 33   Date for SLP Re-Evaluation 08/03/15   SLP Start Time 1405   SLP Stop Time  1447   SLP Time Calculation (min) 42 min   Activity Tolerance Patient tolerated treatment well      Past Medical History  Diagnosis Date  . Hypertension     Past Surgical History  Procedure Laterality Date  . No past surgeries      There were no vitals filed for this visit.  Visit Diagnosis: Expressive aphasia  Verbal apraxia  Receptive aphasia      Subjective Assessment - 06/08/15 1411    Subjective Pt arrived with his mother today, came back alone.               ADULT SLP TREATMENT - 06/08/15 1411    General Information   Behavior/Cognition Alert;Cooperative;Pleasant mood   Treatment Provided   Treatment provided Cognitive-Linquistic   Pain Assessment   Pain Assessment No/denies pain   Cognitive-Linquistic Treatment   Treatment focused on Aphasia;Apraxia   Skilled Treatment SLP used letter board and low tech augmentative communication picture board to encourage pt to communicate via those means. Pt spelled 2/12 words correctly with alphabet board, but in simple conversation was able to communicate effictively only 25% of the time because of decr'd spelling due to aphasia. Conversation was driven solely by SLP. With a low tech 2-page communication board with approx 25 pictures, pt successfully communicated in simple conversation 30% of the time, mainly due to limited choices of the board. Conversation was solely SLP driven. Rarely (<10% of the time) today, SLP had to repeat question -  receptive aphasia noted.   Assessment / Recommendations / Plan   Plan Continue with current plan of care   Progression Toward Goals   Progression toward goals Progressing toward goals            SLP Short Term Goals - 05/18/15 1147    SLP SHORT TERM GOAL #6   Title pt will provide functional multimodal responses (except writing) in simple conversation with occasional min A   Baseline for 1 weeks or 3 more visits, for all STGs   Time 1   Period Weeks   Status Not Met   SLP SHORT TERM GOAL #7   Title pt will demo understanding of mod complex conversation with repeats allowed 90%   Time 1   Period Weeks   Status Not Met   SLP SHORT TERM GOAL #8   Title pt will write one word responses to communicate functionally, with multiple attempts allowed, achieving 75% listener comprehension   Status Achieved          SLP Long Term Goals - 06/08/15 1609    SLP LONG TERM GOAL #1   Title pt will demo undertanding of simple/mod complex conversational questions via yes/no responses 80% accuracy   Period --  or 16 visits - for all LTGs   Status Achieved   SLP LONG TERM GOAL #2   Title pt will demo undertanding of simple conversational quesitons via yes/no responses 90% accuracy   Status Achieved  SLP LONG TERM GOAL #3   Title pt will perform rote speech tasks simultaneously with SLP with 40% accuracy (functional responses allowed)   Status Not Met   SLP LONG TERM GOAL #4   Title pt will imitate salient words/family names with 20% success   Status Achieved   SLP LONG TERM GOAL #5   Title pt will demo use of simple alternative/augmentative communcation and/or multimodal communication with 80% success in mod complex message conveyance   Baseline 5 weeks or 7 more visits   Time 1   Period Weeks   Status Achieved   SLP LONG TERM GOAL #6   Title pt will state family names/information and other salient personal 2-3 word information with 80% success   Time 3   Period Weeks  or visits,  LTGs 6 and 7   Status On-going   SLP LONG TERM GOAL #7   Title pt will access a device with functional success for simple "wh" questions in conversation 80% success   Time 3   Period Weeks   Status New          Plan - 06/08/15 1607    Clinical Impression Statement Pt's ability to communicate with low tech means is limited by both severity of aphasia as well as with the limited number of choices available to the patient. He would benefit from something with more choices, that does not require spelling to operate, and that is speech generating. SLP will need to borrow those type devices from [facility], adn skilled ST remains necessary to have patient return to work with these devices.   Speech Therapy Frequency 1x /week   Duration --  3 weeks   Treatment/Interventions SLP instruction and feedback;Compensatory strategies;Internal/external aids;Environmental controls;Patient/family education;Functional tasks;Cueing hierarchy;Multimodal communcation approach   Potential to Achieve Goals Fair   Potential Considerations Severity of impairments        Problem List Patient Active Problem List   Diagnosis Date Noted  . Spastic hemiplegia affecting right dominant side ([place]) 04/13/2015  . 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 Left middle cerebral artery stroke ([place]) 10/21/2014  . Right hemiparesis ([place]) 10/21/2014  . Aphasia due to stroke 10/21/2014    [facility] , MS, CCC-SLP  06/08/2015, 4:13 PM  [facility] [address] [place], [place], [address] Phone: [phone]   Fax:  [phone]   Name: Mario [name] MRN: [id number] Date of Birth: [birth year]

## 2015-06-08 NOTE — Therapy (Signed)
[facility] [address] [place], [place], [address] Phone: [phone]   Fax:  [phone]  Physical Therapy Treatment  Patient Details  Name: Mario [name] MRN: [id number] Date of Birth: 10/28/1975 No Data Recorded  Encounter Date: 06/08/2015      PT End of Session - 06/08/15 1314    Visit Number 29   Number of Visits 33   Date for PT Re-Evaluation 06/24/15  per new POC   PT Start Time 1102   PT Stop Time 1145   PT Time Calculation (min) 43 min   Activity Tolerance Patient tolerated treatment well   Behavior During Therapy [facility] for tasks assessed/performed      Past Medical History  Diagnosis Date  . Hypertension     Past Surgical History  Procedure Laterality Date  . No past surgeries      There were no vitals filed for this visit.  Visit Diagnosis:  Abnormality of gait  Unsteadiness  Hemiparesis following cerebrovascular accident [facility])  Muscle weakness      Subjective Assessment - 06/08/15 1108    Subjective Pt denies pain, no changes since last visit, no falls.    Limitations Walking;House hold activities;Reading;Standing;Writing   Patient Stated Goals Improve speech, be able to walk better, and get functional use of RUE.    Currently in Pain? No/denies             NMR:  Performed clam shell for hip abd x 10 reps RLE only, added to HEP, performed supine single leg bridge with RLE while LLE performed hip and knee flex x 10 reps and vice versa to address isolated R hip and knee flex also x 10 reps each.  Then performed R single limb bridge while on physioball x 4 reps adding R hamstring curl x 10 reps with assist from therapist as well as tactile cues for sequencing due to apraxia.  Progressed to prone (over physioball) L leg extension while R LE performs knee flex with hip extension x 2 sets of 10 reps, again to isolate knee flex with glute med/max activation.  Tolerated well with mild c/o  fatigue.  Performed transitions from tall kneeling to L half kneeling to increase NMR in RLE with WB, weight shift and increased activation of glute med.  Requires tactile facilitation for increased R weight shift and to keep R hip protracted.  Also needed light LUE support.  Ended session with transition from R half kneeling to standing with pt pushing through BUEs on RLE to increase WB through RLE during task.  Pt requires min/mod A to initiate task, however did well once he began to rise from mat.  Performed x 2 reps.                      PT Education - 06/08/15 1313    Education provided Yes   Education Details Addition of exercise to [employer]) Educated Patient   Methods Explanation;Handout   Comprehension Verbalized understanding          PT Short Term Goals - 03/04/15 1035    PT SHORT TERM GOAL #1   Title Pt will improve BERG balance score to 56/56 to decrease fall risk and improve functional mobility. (Target 03/03/15)   Baseline 55/56 on 03/04/15   Status Not Met   PT SHORT TERM GOAL #2   Title Pt will improve gait speed to 2.83 ft/sec to improve functional ambulation level to  community ambulator.   (Target 03/03/15)   Baseline 2.00 ft/sec on 03/04/15, feel that this is due to increased tone.    Status Not Met   PT SHORT TERM GOAL #3   Title Pt will ambulate >500' on indoor and outdoor surfaces including grass, inclines and curb step without AD with AFO at S level without overt LOB to indicate safe negotiation of indoor and outdoor surfaces.  (Target 03/03/15)   Baseline met on 03/03/15, however recommend use of cane at home for increased safety and to decrease tone with gait.     Status Achieved   PT SHORT TERM GOAL #4   Title Pt will negotiate up/down 4 steps in step to pattern with single rail at mod I level with safe technique to traverse community obstacles.   (Target 03/03/15)   Baseline Met on 9/22.   Status Achieved   PT SHORT TERM GOAL #5   Title Pt  will improve 3MWT to 498' to improve ambulation function and functional endurance.    Baseline 366' on 03/03/15   Status Not Met           PT Long Term Goals - 06/03/15 1240    PT LONG TERM GOAL #1   Title Pts SIS mobility score will improve to 80% to indicate improved perceived functional mobility.  (Modified Target 06/24/15)  Target dates updated due to missing 2 wks of PT   Baseline 66.7% on 8/31   Time 8   Period Weeks   Status On-going   PT LONG TERM GOAL #2   Title Pt will improve ankle DF strength to 3/5 in order to increase safety with household ambulation and begin to address ambulation without use of AFO.  (Modified Target 06/24/15)   Baseline still with 3-/5 05/11/15   Time 8   Period Weeks   Status On-going   PT LONG TERM GOAL #3   Title Pt will verbalize ability to return to leisure activities to indicate return to community function.   (Modified Target 05/15/15)   Baseline met 05/04/15 (went fishing over weekend with father)   Time 8   Period Weeks   Status Achieved   PT LONG TERM GOAL #4   Title Pt will negotiate up/down 4 steps without rail in alternating fashion to traverse community stairs while carrying object.  (Modified Target 05/15/15)   Baseline Met 11/9.   Time 8   Period Weeks   Status Achieved   PT LONG TERM GOAL #5   Title Pt will demonstrate ability to ambulate up to 150' with decreased R genu recurvatum to improve R knee stability/strength and prevent knee injury.  (Target Date: 04/16/15)   Baseline met 04/14/15   Time 8   Period Weeks   Status Achieved   PT LONG TERM GOAL #6   Title Pt will demonstrate ability to perform SLS x 5 secs on RLE to improve balance and awareness of RLE. (Modified Target Date: 06/24/15)   Baseline can tolerate up to 3 seconds with RUE support 05/13/15   Time 8   Period Weeks   Status On-going   PT LONG TERM GOAL #7   Title Pt will ambulate on all outdoor surfaces without AD with ASO only in order to indicate improved  gait pattern, increased RLE stability and safety in returning to more challenging leisure activities.  (Target Date: 06/24/15)   Baseline 12/27: Gait x250' (limited by rain) over unlevel, paved surfaces with ASO only requiring supervision due to consistent  R toe drag during RLE advancement. S in AFO on 06/03/15   Status Not Met   PT LONG TERM GOAL #8   Title Pt will demonstrate 3+/5 R hamstring strength with decreased hip/trunk compensation to indicate improved and more efficient gait pattern.  (Target Date: 06/24/15)               Plan - 06/08/15 1314    Clinical Impression Statement Skilled session focused on addressing LTG's (strength) with NMR exercises for isolated knee and hip flex as well as hip extension for improved glute activation.  Tolerated well.     Pt will benefit from skilled therapeutic intervention in order to improve on the following deficits Abnormal gait;Decreased balance;Decreased coordination;Decreased knowledge of precautions;Decreased safety awareness;Decreased strength;Difficulty walking;Impaired perceived functional ability;Impaired sensation;Impaired flexibility;Impaired tone;Impaired UE functional use;Postural dysfunction;Decreased knowledge of use of DME;Pain   Rehab Potential Excellent   Clinical Impairments Affecting Rehab Potential adhering to safety precautions   PT Frequency 2x / week   PT Duration Other (comment)  until 06/24/15   PT Treatment/Interventions ADLs/Self Care Home Management;Electrical Stimulation;Biofeedback;DME Instruction;Gait training;Stair training;Functional mobility training;Therapeutic exercise;Balance training;Neuromuscular re-education;Patient/family education;Manual techniques;Visual/perceptual remediation/compensation   PT Next Visit Plan  R knee stability, stretching heel cord, isolated hip/knee flex, RLE WB, RLE activation.  Continue to address LTG's.    Consulted and Agree with Plan of Care Patient        Problem  List Patient Active Problem List   Diagnosis Date Noted  . Spastic hemiplegia affecting right dominant side ([place]) 04/13/2015  . 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 Left middle cerebral artery stroke ([place]) 10/21/2014  . Right hemiparesis ([place]) 10/21/2014  . Aphasia due to stroke 10/21/2014    Cameron Sprang, PT, MPT [facility] [address] [place], [place], [address] Phone: [phone]   Fax:  [phone] 06/08/2015, 1:16 PM  Name: Mario [name] MRN: [id number] Date of Birth: [birth year]

## 2015-06-08 NOTE — Patient Instructions (Signed)
Come back one to two more times and we will see if there might be a more advanced communication device that could help you.

## 2015-06-08 NOTE — Patient Instructions (Signed)
Abduction: Clam (Eccentric) - Side-Lying    Lie on side with knees bent. Lift top knee, keeping feet together. Keep trunk steady. Slowly lower for 3-5 seconds. _10__ reps per set, _2__ sets per day, _5__ days per week. Make sure that your trunk doesn't rotate and that your feet stay together.  Remember, its a small movement!!  http://ecce.exer.us/64   Copyright  VHI. All rights reserved.

## 2015-06-10 ENCOUNTER — Ambulatory Visit: Payer: BC Managed Care – PPO | Admitting: Rehabilitation

## 2015-06-10 DIAGNOSIS — R269 Unspecified abnormalities of gait and mobility: Secondary | ICD-10-CM | POA: Diagnosis not present

## 2015-06-10 DIAGNOSIS — IMO0002 Reserved for concepts with insufficient information to code with codable children: Secondary | ICD-10-CM

## 2015-06-10 DIAGNOSIS — R2681 Unsteadiness on feet: Secondary | ICD-10-CM

## 2015-06-10 DIAGNOSIS — I69359 Hemiplegia and hemiparesis following cerebral infarction affecting unspecified side: Secondary | ICD-10-CM

## 2015-06-10 NOTE — Therapy (Signed)
[facility] [address] [place], [place], [address] Phone: [phone]   Fax:  [phone]  Physical Therapy Treatment  Patient Details  Name: Mario [name] MRN: [id number] Date of Birth: Sep 04, 1975 No Data Recorded  Encounter Date: 06/10/2015      PT End of Session - 06/10/15 0851    Visit Number 30   Number of Visits 33   Date for PT Re-Evaluation 06/24/15  per new POC   PT Start Time 0846   PT Stop Time 0930   PT Time Calculation (min) 44 min   Activity Tolerance Patient tolerated treatment well   Behavior During Therapy [facility] for tasks assessed/performed      Past Medical History  Diagnosis Date  . Hypertension     Past Surgical History  Procedure Laterality Date  . No past surgeries      There were no vitals filed for this visit.  Visit Diagnosis:  Abnormality of gait  Unsteadiness  Hemiparesis following cerebrovascular accident ([place])  Weakness due to cerebrovascular accident      Subjective Assessment - 06/10/15 0850    Subjective no complaints, no changes, no falls.    Limitations Walking;House hold activities;Reading;Standing;Writing   Patient Stated Goals Improve speech, be able to walk better, and get functional use of RUE.    Currently in Pain? No/denies            NMR:  Addressed R SLS with R ankle stability.  In // bars had pt stand on BOSU ball (black top up) maintaining balance and progressing to squats x 10 reps with min facilitation for technique.  Progressed to standing with RLE (with AFO) on round side of BOSU progressing LLE up to chair height and back down for increased RLE stability.  Tactile cues for increased R glute med and quad activation but did very well with light cues.  Performed x 10 reps.  Wanted to continue to assess R ankle stability as well, therefore removed AFO (shoe donned) and replaced BOSU with airdex pad and performed same step up task x 10 reps with BUE  support x 5 then with only RUE support.  Provided light support at R ankle, however did not note any overt ankle instability, therefore replaced with yet even more unstable surface of dynadisc and performed another 10 reps with BUE support x 5 and remainder with RUE only.  Did have to provide increased assist, but did well with continued verbal cues for attention to R ankle.  Progressed to therapy mat in order to perform quadruped tasks.  Initially performed LLE extension with R knee on dynadisc to increase stabilization in R hip x 10 reps.  Progressed to having pt alternate RUE/LLE with assist at RUE for full shoulder flexion.  Transitioned to elevating LUE/RLE with assist at [place] for full WB and safety at arm with cues for decreased trunk rotation when elevating RLE.  Tolerated well.  Ended session with gait around track without device with AFO with B arms on therapist shoulders to address posture.  Note that with use of mirror pt able to correct over elevation in L shoulder, however unable to maintain during gait.  Performed x 230'.                       PT Education - 06/10/15 [id number]    Education provided Yes   Education Details compliance with HEP   Person(s) Educated Patient   Methods Explanation  Comprehension Verbalized understanding          PT Short Term Goals - 03/04/15 1035    PT SHORT TERM GOAL #1   Title Pt will improve BERG balance score to 56/56 to decrease fall risk and improve functional mobility. (Target 03/03/15)   Baseline 55/56 on 03/04/15   Status Not Met   PT SHORT TERM GOAL #2   Title Pt will improve gait speed to 2.83 ft/sec to improve functional ambulation level to community ambulator.   (Target 03/03/15)   Baseline 2.00 ft/sec on 03/04/15, feel that this is due to increased tone.    Status Not Met   PT SHORT TERM GOAL #3   Title Pt will ambulate >500' on indoor and outdoor surfaces including grass, inclines and curb step without AD with AFO at S level  without overt LOB to indicate safe negotiation of indoor and outdoor surfaces.  (Target 03/03/15)   Baseline met on 03/03/15, however recommend use of cane at home for increased safety and to decrease tone with gait.     Status Achieved   PT SHORT TERM GOAL #4   Title Pt will negotiate up/down 4 steps in step to pattern with single rail at mod I level with safe technique to traverse community obstacles.   (Target 03/03/15)   Baseline Met on 9/22.   Status Achieved   PT SHORT TERM GOAL #5   Title Pt will improve 3MWT to 498' to improve ambulation function and functional endurance.    Baseline 366' on 03/03/15   Status Not Met           PT Long Term Goals - 06/10/15 1149    PT LONG TERM GOAL #1   Title Pts SIS mobility score will improve to 80% to indicate improved perceived functional mobility.  (Modified Target 06/24/15)  Target dates updated due to missing 2 wks of PT   Baseline 66.7% on 8/31   Time 8   Period Weeks   Status On-going   PT LONG TERM GOAL #2   Title Pt will improve ankle DF strength to 3/5 in order to increase safety with household ambulation and begin to address ambulation without use of AFO.  (Modified Target 06/24/15)   Baseline still with 3-/5 05/11/15   Time 8   Period Weeks   Status On-going   PT LONG TERM GOAL #3   Title Pt will verbalize ability to return to leisure activities to indicate return to community function.   (Modified Target 05/15/15)   Baseline met 05/04/15 (went fishing over weekend with father)   Time 8   Period Weeks   Status Achieved   PT LONG TERM GOAL #4   Title Pt will negotiate up/down 4 steps without rail in alternating fashion to traverse community stairs while carrying object.  (Modified Target 05/15/15)   Baseline Met 11/9.   Time 8   Period Weeks   Status Achieved   PT LONG TERM GOAL #5   Title Pt will demonstrate ability to ambulate up to 150' with decreased R genu recurvatum to improve R knee stability/strength and prevent knee  injury.  (Target Date: 04/16/15)   Baseline met 04/14/15   Time 8   Period Weeks   Status Achieved   PT LONG TERM GOAL #6   Title Pt will demonstrate ability to perform SLS x 5 secs on RLE to improve balance and awareness of RLE. (Modified Target Date: 06/24/15)   Baseline can tolerate up to 3  seconds with RUE support 05/13/15   Time 8   Period Weeks   Status On-going   PT LONG TERM GOAL #7   Title Pt will ambulate on all outdoor surfaces without AD with AFO at mod I level  in order to indicate improved gait pattern and safety in returning to more challenging leisure activities.  (Target Date: 06/24/15)   Baseline 12/27: Gait x250' (limited by rain) over unlevel, paved surfaces with ASO only requiring supervision due to consistent R toe drag during RLE advancement. S in AFO on 06/03/15   Status Revised   PT LONG TERM GOAL #8   Title Pt will demonstrate 3+/5 R hamstring strength with decreased hip/trunk compensation to indicate improved and more efficient gait pattern.  (Target Date: 06/24/15)               Plan - 06/10/15 7209    Clinical Impression Statement Skilled session focused on R LE strengthening with SLS, ankle stability, and NMR for RUE/LE with WB and quadruped activities.  Tolerated well during session.     Pt will benefit from skilled therapeutic intervention in order to improve on the following deficits Abnormal gait;Decreased balance;Decreased coordination;Decreased knowledge of precautions;Decreased safety awareness;Decreased strength;Difficulty walking;Impaired perceived functional ability;Impaired sensation;Impaired flexibility;Impaired tone;Impaired UE functional use;Postural dysfunction;Decreased knowledge of use of DME;Pain   Rehab Potential Excellent   Clinical Impairments Affecting Rehab Potential adhering to safety precautions   PT Frequency 2x / week   PT Duration Other (comment)  until 06/24/15   PT Treatment/Interventions ADLs/Self Care Home  Management;Electrical Stimulation;Biofeedback;DME Instruction;Gait training;Stair training;Functional mobility training;Therapeutic exercise;Balance training;Neuromuscular re-education;Patient/family education;Manual techniques;Visual/perceptual remediation/compensation   PT Next Visit Plan  R knee stability, stretching heel cord, isolated hip/knee flex, RLE WB, RLE activation.  Continue to address LTG's.    Consulted and Agree with Plan of Care Patient        Problem List Patient Active Problem List   Diagnosis Date Noted  . Spastic hemiplegia affecting right dominant side ([place]) 04/13/2015  . 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 Left middle cerebral artery stroke ([place]) 10/21/2014  . Right hemiparesis ([place]) 10/21/2014  . Aphasia due to stroke 10/21/2014   Cameron Sprang, PT, MPT [facility] [address] [place], [place], [address] Phone: [phone]   Fax:  [phone] 06/10/2015, 11:55 AM  Name: Mario [name] MRN: [id number] Date of Birth: 02/15/1976

## 2015-06-14 ENCOUNTER — Ambulatory Visit: Payer: BC Managed Care – PPO | Admitting: Physical Medicine & Rehabilitation

## 2015-06-14 ENCOUNTER — Ambulatory Visit: Payer: BC Managed Care – PPO | Admitting: Occupational Therapy

## 2015-06-15 ENCOUNTER — Ambulatory Visit: Payer: BC Managed Care – PPO | Admitting: Rehabilitation

## 2015-06-22 ENCOUNTER — Encounter: Payer: Self-pay | Admitting: Occupational Therapy

## 2015-06-22 ENCOUNTER — Ambulatory Visit: Payer: BC Managed Care – PPO | Admitting: Occupational Therapy

## 2015-06-22 ENCOUNTER — Ambulatory Visit: Payer: BC Managed Care – PPO | Admitting: Rehabilitation

## 2015-06-22 ENCOUNTER — Encounter: Payer: Self-pay | Admitting: Rehabilitation

## 2015-06-22 DIAGNOSIS — I69359 Hemiplegia and hemiparesis following cerebral infarction affecting unspecified side: Secondary | ICD-10-CM

## 2015-06-22 DIAGNOSIS — R201 Hypoesthesia of skin: Secondary | ICD-10-CM

## 2015-06-22 DIAGNOSIS — M6281 Muscle weakness (generalized): Secondary | ICD-10-CM

## 2015-06-22 DIAGNOSIS — R269 Unspecified abnormalities of gait and mobility: Secondary | ICD-10-CM | POA: Diagnosis not present

## 2015-06-22 DIAGNOSIS — R531 Weakness: Secondary | ICD-10-CM

## 2015-06-22 DIAGNOSIS — R2681 Unsteadiness on feet: Secondary | ICD-10-CM

## 2015-06-22 DIAGNOSIS — G8111 Spastic hemiplegia affecting right dominant side: Secondary | ICD-10-CM

## 2015-06-22 DIAGNOSIS — R4189 Other symptoms and signs involving cognitive functions and awareness: Secondary | ICD-10-CM

## 2015-06-22 DIAGNOSIS — IMO0002 Reserved for concepts with insufficient information to code with codable children: Secondary | ICD-10-CM

## 2015-06-22 NOTE — Patient Instructions (Addendum)
Achilles / Gastroc, Standing    Stand, right foot behind, heel on floor and turned slightly out, leg straight, forward leg bent. Move hips forward. Hold _60__ seconds. Repeat _2_ times per session. Do _2-3__ sessions per day. Make sure that your R foot is pointed straight ahead.  Don't bounce!! (Don't wear brace for this one)  Copyright  VHI. All rights reserved.     Keeping feet flat on floor, shoulder width apart, squat as low as is comfortable. Use support as necessary. Repeat _15___ times per set. Do _1___ sets per session. Do __2__ sessions per day. Make sure both legs are moving at the same time. Make sure knees do not go over toes, so pretend you are sitting in a chair behind you. More of your weight should be towards your heels!!! Don't go so deep to make sure that knees aren't over toes.   Wear your brace for the rest of these!!   KNEE: Flexion - Prone      Bend knee. Raise heel toward buttocks. Do not raise hips!!!!. _10__ reps per set, _2__ sets per day, _5__ days per week   Copyright  VHI. All rights reserved.   Bridging (Single Leg)    Lie on back with feet shoulder width apart and left leg straight. Lift hips toward the ceiling while keeping leg straight. Hold __3__ seconds. Repeat __10__ times. Do __2__ sessions per day.  http://gt2.exer.us/358   Copyright  VHI. All rights reserved.     These exercises are called 4 way hip exercises. Attach band to something sturdy like bed post or sturdy dinning room table. Make sure that it won't move and that you have something to hold onto with your left arm. You will essentially turn in all four direction, see below. Make sure you stand tall and keep R knee "soft" (bent slightly) when moving L leg and keep leg that is in band straight and off of ground (don't touch down before returning to starting position). Perform all directions 10 reps both legs. Once a day. Make sure you wear your brace!  Balance,  Proprioception: Hip Flexion With Tubing   With tubing attached to ankle of uninvolved leg, swing leg forward. Return.  http://cc.exer.us/18   Copyright  VHI. All rights reserved.   EXTENSION: Standing - Resistance Band (Active)   Stand, both feet flat. Against yellow resistance band, draw right leg behind body as far as possible.  http://gtsc.exer.us/82   Copyright  VHI. All rights reserved.   (Clinic) Balance: With Hip Abduction - Unilateral Stance (Resist) (don't worry about what picture shows for arm)   Opposite side toward pulley, strap around left ankle, leg in. Swing leg across body and out to side. Use arm support only as needed for balance.    Copyright  VHI. All rights reserved.   (Clinic) Balance: With Hip Adduction - Unilateral Stance (Resist)   Left side toward pulley, strap around ankle, foot out to side. Swing foot across body and beyond. Use arm support for balance.    You can keep the band around your ankle when you do the exercises above, then add "marching" with R leg x 10 reps.  Make sure knee stays in front of body and doesn't move to the side.  Do 1-2 times a day.

## 2015-06-22 NOTE — Therapy (Signed)
[facility] [address] [place], [place], [address] Phone: [phone]   Fax:  [phone]  Physical Therapy Treatment  Patient Details  Name: Mario [name] MRN: [id number] Date of Birth: [birth year] No Data Recorded  Encounter Date: 06/22/2015      PT End of Session - 06/22/15 0849    Visit Number 31   Number of Visits 33   Date for PT Re-Evaluation 06/24/15  per new POC   Authorization Type [employer] (waiting to hear if changed in new year)   PT Start Time 0845   PT Stop Time 0930   PT Time Calculation (min) 45 min   Activity Tolerance Patient tolerated treatment well   Behavior During Therapy [facility] for tasks assessed/performed      Past Medical History  Diagnosis Date  . Hypertension     Past Surgical History  Procedure Laterality Date  . No past surgeries      There were no vitals filed for this visit.  Visit Diagnosis:  Abnormality of gait  Unsteadiness  Weakness due to cerebrovascular accident  Hemiparesis following cerebrovascular accident [facility])      Subjective Assessment - 06/22/15 0849    Subjective No compliants, no changes since last visit, no reported falls.    Limitations Walking;House hold activities;Reading;Standing;Writing   Patient Stated Goals Improve speech, be able to walk better, and get functional use of RUE.    Currently in Pain? No/denies              TE:  Addressed and made modifications to pts current HEP and performed all exercises to prepare for D/C on Thursday.  See pt instruction for full details.  Performed all as stated on pt instruction, except only did 4 way hip exercise on RLE due to time constraint.                     PT Education - 06/22/15 0849    Education provided Yes   Education Details current HEP, spliting up to ensure compliance   Person(s) Educated Patient   Methods Explanation;Handout;Demonstration   Comprehension Verbalized  understanding;Returned demonstration          PT Short Term Goals - 03/04/15 1035    PT SHORT TERM GOAL #1   Title Pt will improve BERG balance score to 56/56 to decrease fall risk and improve functional mobility. (Target 03/03/15)   Baseline 55/56 on 03/04/15   Status Not Met   PT SHORT TERM GOAL #2   Title Pt will improve gait speed to 2.83 ft/sec to improve functional ambulation level to community ambulator.   (Target 03/03/15)   Baseline 2.00 ft/sec on 03/04/15, feel that this is due to increased tone.    Status Not Met   PT SHORT TERM GOAL #3   Title Pt will ambulate >500' on indoor and outdoor surfaces including grass, inclines and curb step without AD with AFO at S level without overt LOB to indicate safe negotiation of indoor and outdoor surfaces.  (Target 03/03/15)   Baseline met on 03/03/15, however recommend use of cane at home for increased safety and to decrease tone with gait.     Status Achieved   PT SHORT TERM GOAL #4   Title Pt will negotiate up/down 4 steps in step to pattern with single rail at mod I level with safe technique to traverse community obstacles.   (Target 03/03/15)   Baseline Met on 9/22.   Status Achieved  PT SHORT TERM GOAL #5   Title Pt will improve 3MWT to 498' to improve ambulation function and functional endurance.    Baseline 366' on 03/03/15   Status Not Met           PT Long Term Goals - 06/10/15 1149    PT LONG TERM GOAL #1   Title Pts SIS mobility score will improve to 80% to indicate improved perceived functional mobility.  (Modified Target 06/24/15)  Target dates updated due to missing 2 wks of PT   Baseline 66.7% on 8/31   Time 8   Period Weeks   Status On-going   PT LONG TERM GOAL #2   Title Pt will improve ankle DF strength to 3/5 in order to increase safety with household ambulation and begin to address ambulation without use of AFO.  (Modified Target 06/24/15)   Baseline still with 3-/5 05/11/15   Time 8   Period Weeks   Status  On-going   PT LONG TERM GOAL #3   Title Pt will verbalize ability to return to leisure activities to indicate return to community function.   (Modified Target 05/15/15)   Baseline met 05/04/15 (went fishing over weekend with father)   Time 8   Period Weeks   Status Achieved   PT LONG TERM GOAL #4   Title Pt will negotiate up/down 4 steps without rail in alternating fashion to traverse community stairs while carrying object.  (Modified Target 05/15/15)   Baseline Met 11/9.   Time 8   Period Weeks   Status Achieved   PT LONG TERM GOAL #5   Title Pt will demonstrate ability to ambulate up to 150' with decreased R genu recurvatum to improve R knee stability/strength and prevent knee injury.  (Target Date: 04/16/15)   Baseline met 04/14/15   Time 8   Period Weeks   Status Achieved   PT LONG TERM GOAL #6   Title Pt will demonstrate ability to perform SLS x 5 secs on RLE to improve balance and awareness of RLE. (Modified Target Date: 06/24/15)   Baseline can tolerate up to 3 seconds with RUE support 05/13/15   Time 8   Period Weeks   Status On-going   PT LONG TERM GOAL #7   Title Pt will ambulate on all outdoor surfaces without AD with AFO at mod I level  in order to indicate improved gait pattern and safety in returning to more challenging leisure activities.  (Target Date: 06/24/15)   Baseline 12/27: Gait x250' (limited by rain) over unlevel, paved surfaces with ASO only requiring supervision due to consistent R toe drag during RLE advancement. S in AFO on 06/03/15   Status Revised   PT LONG TERM GOAL #8   Title Pt will demonstrate 3+/5 R hamstring strength with decreased hip/trunk compensation to indicate improved and more efficient gait pattern.  (Target Date: 06/24/15)               Plan - 06/22/15 0850    Clinical Impression Statement Skilled session focused on addressing and providing pt with current HEP to prepare for DC on Thursday.  See pt instruction for full details on  exercises performed during session.    Pt will benefit from skilled therapeutic intervention in order to improve on the following deficits Abnormal gait;Decreased balance;Decreased coordination;Decreased knowledge of precautions;Decreased safety awareness;Decreased strength;Difficulty walking;Impaired perceived functional ability;Impaired sensation;Impaired flexibility;Impaired tone;Impaired UE functional use;Postural dysfunction;Decreased knowledge of use of DME;Pain   Rehab Potential Excellent  Clinical Impairments Affecting Rehab Potential adhering to safety precautions   PT Frequency 2x / week   PT Duration Other (comment)  until 06/24/15   PT Treatment/Interventions ADLs/Self Care Home Management;Electrical Stimulation;Biofeedback;DME Instruction;Gait training;Stair training;Functional mobility training;Therapeutic exercise;Balance training;Neuromuscular re-education;Patient/family education;Manual techniques;Visual/perceptual remediation/compensation   PT Next Visit Plan LTG's and DC (SIS-mobility for FOTO)   Consulted and Agree with Plan of Care Patient        Problem List Patient Active Problem List   Diagnosis Date Noted  . Spastic hemiplegia affecting right dominant side ([place]) 04/13/2015  . 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 Left middle cerebral artery stroke ([place]) 10/21/2014  . Right hemiparesis ([place]) 10/21/2014  . Aphasia due to stroke 10/21/2014    Cameron Sprang, PT, MPT [facility] [address] [place], [place], [address] Phone: [phone]   Fax:  [phone] 06/22/2015, 11:25 AM  Name: Mario [name] MRN: [id number] Date of Birth: 1976/05/06

## 2015-06-22 NOTE — Therapy (Signed)
[facility] [address] [place], [place], [address] Phone: [phone]   Fax:  [phone]  Occupational Therapy Treatment  Patient Details  Name: Mario [name] MRN: [id number] Date of Birth: January 06, 1976 Referring Provider: Blane Ohara  Encounter Date: 06/22/2015      OT End of Session - 06/22/15 1222    Visit Number 1   Number of Visits 16   Date for OT Re-Evaluation 08/17/15   Authorization Type BCBS state heatlh PPO -30 visits combined for OT/PT   Authorization - Visit Number 1   Authorization - Number of Visits 15   OT Start Time 1103   OT Stop Time 1146   OT Time Calculation (min) 43 min      Past Medical History  Diagnosis Date  . Hypertension     Past Surgical History  Procedure Laterality Date  . No past surgeries      There were no vitals filed for this visit.  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 No/denies                      OT Treatments/Exercises (OP) - 06/22/15 0001    Neurological Re-education Exercises   Other Exercises 1 Pt returns after prolonged break due to scheduling issues. Pt reports he is using RUE more at home for ADL's and for reaching/holding tasks. Pt with increased tone/tightness in R shoulder gridle. Neuro re ed in supine intially to improve alignment, decrease tone and increase lenth in bilateral active overhead reach closed chain. Progressed to sitting with bilateral overhead reach closed chain with min- mod  facilitation with good results. Then focused on low to mid reach activity with functional grasp and release task with min facilitation with emphasis on overriding flexor influence as well as hand orienation to task. Pt to see rehab MD next week.       In discussion with pt, pt able to communicate that he is experiencing feelings of depression. Mother also shared concerns with this therapist. Pt reports no changes in sleep but is experiencing decreased appetite. Some episodes of crying per mom; mom also reports that pt at times will isolate himself.  Pt agrees that at times he does this.  Pt gave permission to share this info with rehab MD whom he will see next week. Pt open to considering medication - doubt pt could benefit from counseling given pt's language deficits. Email sent to MD with pt's permission.            OT Short Term Goals - 06/22/15 1217    OT SHORT TERM GOAL #5   Status --  pt able to use as gross assist for some parts of ADL activity   OT SHORT TERM GOAL #6   Title Pt will be mod I with upgraded HEP - 05/06/2015 (dated adjusted as pt missed one week of OT)   Status Achieved   OT SHORT TERM GOAL #7   Title Pt will demonstrate ability to cast fishing  pole with RUE with no more than mod assist   Status Achieved   OT SHORT TERM GOAL #8   Title Pt will be able to use RUE as stabilizer during hot meal prep   Status Achieved   OT SHORT TERM GOAL  #9   TITLE Pt will demonstrate ability to pick up and place light object at 90* of reach with RUE with min compensations.   Status Achieved   OT SHORT TERM GOAL  #10   TITLE Pt will be mod I with upgraded HEP - 07/20/2015 (date adjusted as pt missed 2 weeks of therapy due to scheduling issues   Status On-going   OT SHORT TERM GOAL  #11   TITLE Pt will be able to use R hand as non dominant assist in at least two functional tasks. 07/20/2015 (date adjusted as pt missed 2 weeks of therapy due to scheduling issues)   Status On-going    OT SHORT TERM GOAL  #12   TITLE Pt will be able to pick up and release small objects at table top activity with min compensations and vc's only.   Status On-going           OT Long Term Goals - 06/22/15 1220    OT LONG TERM GOAL #6   Title --  moved to [facility] on 03/30/2015   OT LONG TERM GOAL #8   Title Pt will be mod I with upgraded HEP prn - 06/03/2015 (date adjusted as pt missed one week of OT)   Status Achieved   OT LONG TERM GOAL  #9   Baseline Pt will demonstrate ability to overhead reach to grasp light object off shelf with RUE with no more than min facilitation   Status Achieved   OT LONG TERM GOAL  #10   TITLE Pt will demostrate ability to use RUE as gross assist for 100% of basic self care tasks.   Status Achieved   OT LONG TERM GOAL  #11   TITLE Pt will be able to use RUE as gross assist during hot meal prep   Status Achieved   OT LONG TERM GOAL  #12   TITLE Pt will be mod I with upgraded HEP - 08/17/2015 (date adjusted due to pt missed 2 weeks of therapy due to scheduling issues)   Status On-going   OT LONG TERM GOAL  #13   TITLE Pt will demonstrate ability to use RUE as non dominant at least 50% in basic self care tasks.    Status On-going   OT LONG TERM GOAL  #14   TITLE Pt will demonstrate ability to pick up at least 2 pound object in higher cabinet with RUE with min compensations only.    Status On-going   OT LONG TERM GOAL  #15   TITLE Pt will be able to dial cell phone with R hand with increased time and no more than min assist.    Status On-going               Plan - 06/22/15 1221    Clinical Impression Statement Pt making progress toward goals. Pt wtih increased tone as he increased use of RUE functionally. Pt to see rehab MD next week to discuss possible botox.  Pt using RUE more at home.    Pt will benefit from skilled therapeutic intervention in order to improve on the following deficits (Retired) Abnormal gait;Decreased balance;Decreased  coordination;Decreased cognition;Decreased knowledge of use of DME;Decreased mobility;Decreased range of  motion;Decreased strength;Increased edema;Impaired UE functional use;Impaired tone;Impaired sensation;Pain   Rehab Potential Good   Clinical Impairments Affecting Rehab Potential global aphasia, apraxia, impaired sensation   OT Frequency 2x / week   OT Duration 8 weeks   OT Treatment/Interventions Self-care/ADL training;Ultrasound;Moist Heat;Electrical Stimulation;DME and/or AE instruction;Neuromuscular education;Therapeutic exercise;Functional Mobility Training;Manual Therapy;Passive range of motion;Splinting;Therapeutic activities;Balance training;Patient/family education;Cognitive remediation/compensation   Plan NMR for functional reach and use, estim,    Consulted and Agree with Plan of Care Patient        Problem List Patient Active Problem List   Diagnosis Date Noted  . Spastic hemiplegia affecting right dominant side (HCC) 04/13/2015  . 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 Right hemiparesis (HCC) 10/21/2014  . Aphasia due to stroke 10/21/2014    Norton Pastel, OTR/L 06/22/2015, 12:24 PM  [facility] [address] [place], [place], [address] Phone: [phone]   Fax:  [phone]  Name: KAEDIN HICKLIN MRN: [id number] Date of Birth: 1976/03/03

## 2015-06-24 ENCOUNTER — Encounter: Payer: Self-pay | Admitting: Occupational Therapy

## 2015-06-24 ENCOUNTER — Encounter: Payer: Self-pay | Admitting: Rehabilitation

## 2015-06-24 ENCOUNTER — Ambulatory Visit: Payer: BC Managed Care – PPO | Admitting: Rehabilitation

## 2015-06-24 ENCOUNTER — Ambulatory Visit: Payer: BC Managed Care – PPO | Admitting: Occupational Therapy

## 2015-06-24 DIAGNOSIS — M6281 Muscle weakness (generalized): Secondary | ICD-10-CM

## 2015-06-24 DIAGNOSIS — IMO0002 Reserved for concepts with insufficient information to code with codable children: Secondary | ICD-10-CM

## 2015-06-24 DIAGNOSIS — G8111 Spastic hemiplegia affecting right dominant side: Secondary | ICD-10-CM

## 2015-06-24 DIAGNOSIS — R2681 Unsteadiness on feet: Secondary | ICD-10-CM

## 2015-06-24 DIAGNOSIS — R201 Hypoesthesia of skin: Secondary | ICD-10-CM

## 2015-06-24 DIAGNOSIS — R269 Unspecified abnormalities of gait and mobility: Secondary | ICD-10-CM

## 2015-06-24 DIAGNOSIS — R4189 Other symptoms and signs involving cognitive functions and awareness: Secondary | ICD-10-CM

## 2015-06-24 NOTE — Therapy (Signed)
[facility] [address] [place], [place], [address] Phone: [phone]   Fax:  [phone]  Occupational Therapy Treatment  Patient Details  Name: Mario [name] MRN: [id number] Date of Birth: [birth year] Referring Provider: Blane Ohara  Encounter Date: 06/24/2015      OT End of Session - 06/24/15 1219    Visit Number 2   Number of Visits 16   Date for OT Re-Evaluation 08/17/15   Authorization Type BCBS state heatlh PPO -30 visits combined for OT/PT   Authorization - Visit Number 6  combined visits for OT/PT   Authorization - Number of Visits 30  combined visits between PT and OT   OT Start Time 1102   OT Stop Time 1145   OT Time Calculation (min) 43 min   Activity Tolerance Patient tolerated treatment well      Past Medical History  Diagnosis Date  . Hypertension     Past Surgical History  Procedure Laterality Date  . No past surgeries      There were no vitals filed for this visit.  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 No/denies                      OT Treatments/Exercises (OP) - 06/24/15 0001    Neurological Re-education Exercises   Other Exercises 1 Neuro re ed to address proximal strenth, bilateral overhead reach in closed chain in sitting as prep to unilateral reach with functional task.  Progressed to low to mid reach with RUE open chained to pick up objects out of bowl and place in container - pt initially needed min facilitation to assist with weight of arm and for motor planning however as pt  contineud with activity RUE became active enough to withdraw support.  Pt also particpated in bilateral task of folding laundry - pt needs cues to involved RUE as much as possible and cues/demonstateion for motor planning.                   OT Short Term Goals - 06/24/15 1217    OT SHORT TERM GOAL #5   Status --  pt able to use as gross assist for some parts of ADL activity   OT SHORT TERM GOAL  #10   TITLE Pt will be mod I with upgraded HEP - 07/20/2015 (date adjusted as pt missed 2 weeks of therapy due to scheduling issues   Status On-going   OT SHORT TERM GOAL  #11   TITLE Pt will be able to use R hand as non dominant assist in at least two functional tasks. 07/20/2015 (date adjusted as pt missed 2 weeks of therapy due to scheduling issues)   Status On-going   OT SHORT TERM GOAL  #12   TITLE Pt will be able to pick up and release small objects at table top activity with min compensations and vc's only.   Status On-going           OT Long Term Goals - 06/24/15 1218    OT LONG TERM  GOAL #6   Title --  moved to [facility] on 03/30/2015   OT LONG TERM GOAL  #12   TITLE Pt will be mod I with upgraded HEP - 08/17/2015 (date adjusted due to pt missed 2 weeks of therapy due to scheduling issues)   Status On-going   OT LONG TERM GOAL  #13   TITLE Pt will demonstrate ability to use RUE as non dominant at least 50% in basic self care tasks.    Status On-going   OT LONG TERM GOAL  #14   TITLE Pt will demonstrate ability to pick up at least 2 pound object in higher cabinet with RUE with min compensations only.    Status On-going   OT LONG TERM GOAL  #15   TITLE Pt will be able to dial cell phone with R hand with increased time and no more than min assist.    Status On-going               Plan - 06/24/15 1218    Clinical Impression Statement Pt continues to make good progress toward goals. Pt does also continue with increased tone especially in bicep and hand.  Pt to see MD  tomorow for possible botox injection.    Pt will benefit from skilled therapeutic intervention in order to improve on the following deficits (Retired) Abnormal gait;Decreased balance;Decreased coordination;Decreased cognition;Decreased knowledge of use of DME;Decreased mobility;Decreased range of motion;Decreased strength;Increased edema;Impaired UE functional use;Impaired tone;Impaired sensation;Pain   Rehab Potential Good   Clinical Impairments Affecting Rehab Potential global aphasia, apraxia, impaired sensation   OT Frequency 2x / week   OT Duration 8 weeks   OT Treatment/Interventions Self-care/ADL training;Ultrasound;Moist Heat;Electrical Stimulation;DME and/or AE instruction;Neuromuscular education;Therapeutic exercise;Functional Mobility Training;Manual Therapy;Passive range of motion;Splinting;Therapeutic activities;Balance training;Patient/family education;Cognitive remediation/compensation   Plan NMR for functional reach and use, estim   Consulted and Agree with Plan of Care Patient        Problem List Patient Active Problem List   Diagnosis Date Noted  . Spastic hemiplegia affecting right dominant side (HCC) 04/13/2015  . 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 Right hemiparesis (HCC) 10/21/2014  . Aphasia due to stroke 10/21/2014    Mackie Pai [facility] 06/24/2015, 12:21 PM  [facility] [address] [place], [place], [address] Phone: [phone]   Fax:  [phone]  Name: Mario [name] MRN: [id number] Date of Birth: 1975-07-16

## 2015-06-24 NOTE — Therapy (Signed)
[facility] [address] [place], [place], [address] Phone: [phone]   Fax:  [phone]  Physical Therapy Treatment and DC Summary  Patient Details  Name: Mario [name] MRN: [id number] Date of Birth: 1976-04-04 No Data Recorded  Encounter Date: 06/24/2015      PT End of Session - 06/24/15 0849    Visit Number 32   Number of Visits 33   Date for PT Re-Evaluation 06/24/15  per new POC   Authorization Type BCBS (waiting to hear if changed in new year)   PT Start Time 0847   PT Stop Time 0930   PT Time Calculation (min) 43 min   Activity Tolerance Patient tolerated treatment well   Behavior During Therapy [facility] for tasks assessed/performed      Past Medical History  Diagnosis Date  . Hypertension     Past Surgical History  Procedure Laterality Date  . No past surgeries      There were no vitals filed for this visit.  Visit Diagnosis:  Abnormality of gait  Spastic hemiplegia affecting right dominant side (HCC)  Unsteadiness  Muscle weakness      Subjective Assessment - 06/24/15 0849    Subjective No complaints, no changes since last visit, no reported falls.    Limitations Walking;House hold activities;Reading;Standing;Writing   Patient Stated Goals Improve speech, be able to walk better, and get functional use of RUE.    Currently in Pain? No/denies           Gait: Assessed LTG with gait outdoors with use of brace.  Pt able to ambulate >1000' on varying outdoor surfaces at mod I level (use of R AFO) and decreased speed for safety.  Pt verbalized understanding and was able to communicate that he has been out in woods hunting on his own.   Self Care: Provided education on safety with hunting in woods.  He has been doing this alone, however he does have a gun holder that he shoots from to prevent from having to hold, however he does have to climb in a tree stand.  Feel that pt may not be as safe  to do this task, however feel that he will be non-compliant with recommendations.  Provided pt with SIS for mobility during session and went over with him to best of ability (due to aphasia) to answer for LTG.  See below for details.    TA:  Assessed R hamstring and R DF strength for LTG to ensure pt functional.  Note pt has met goal for hamstring strength, however is actually somewhat weaker in ankle DF, may be due to botox injection.  Continue to feel that he needs brace for safety and foot clearance.                       PT Education - 06/24/15 0849    Education provided Yes   Person(s) Educated Patient   Methods Explanation   Comprehension Verbalized understanding          PT Short Term Goals - 03/04/15 1035    PT SHORT TERM GOAL #1   Title Pt will improve BERG balance score to 56/56 to decrease fall risk and improve functional mobility. (Target 03/03/15)   Baseline 55/56 on 03/04/15   Status Not Met   PT SHORT TERM GOAL #2   Title Pt will improve gait speed to 2.83 ft/sec to improve functional ambulation level to community ambulator.   (  Target 03/03/15)   Baseline 2.00 ft/sec on 03/04/15, feel that this is due to increased tone.    Status Not Met   PT SHORT TERM GOAL #3   Title Pt will ambulate >500' on indoor and outdoor surfaces including grass, inclines and curb step without AD with AFO at S level without overt LOB to indicate safe negotiation of indoor and outdoor surfaces.  (Target 03/03/15)   Baseline met on 03/03/15, however recommend use of cane at home for increased safety and to decrease tone with gait.     Status Achieved   PT SHORT TERM GOAL #4   Title Pt will negotiate up/down 4 steps in step to pattern with single rail at mod I level with safe technique to traverse community obstacles.   (Target 03/03/15)   Baseline Met on 9/22.   Status Achieved   PT SHORT TERM GOAL #5   Title Pt will improve 3MWT to 498' to improve ambulation function and functional  endurance.    Baseline 366' on 03/03/15   Status Not Met           PT Long Term Goals - 06/24/15 0850    PT LONG TERM GOAL #1   Title Pts SIS mobility score will improve to 80% to indicate improved perceived functional mobility.  (Modified Target 06/24/15)   Baseline 75% on 06/24/15   Time 8   Period Weeks   Status Partially Met   PT LONG TERM GOAL #2   Title Pt will improve ankle DF strength to 3/5 in order to increase safety with household ambulation and begin to address ambulation without use of AFO.  (Modified Target 06/24/15)   Baseline  2/5 on 06/24/15 (Feel that this may be due to botox)   Time 8   Period Weeks   Status Not Met   PT LONG TERM GOAL #3   Title Pt will verbalize ability to return to leisure activities to indicate return to community function.   (Modified Target 05/15/15)   Baseline met 05/04/15 (went fishing over weekend with father)   Time 8   Period Weeks   Status Achieved   PT LONG TERM GOAL #4   Title Pt will negotiate up/down 4 steps without rail in alternating fashion to traverse community stairs while carrying object.  (Modified Target 05/15/15)   Baseline Met 11/9.   Time 8   Period Weeks   Status Achieved   PT LONG TERM GOAL #5   Title Pt will demonstrate ability to ambulate up to 150' with decreased R genu recurvatum to improve R knee stability/strength and prevent knee injury.  (Target Date: 04/16/15)   Baseline met 04/14/15   Time 8   Period Weeks   Status Achieved   PT LONG TERM GOAL #6   Title Pt will demonstrate ability to perform SLS x 5 secs on RLE to improve balance and awareness of RLE. (Modified Target Date: 06/24/15)   Baseline met 06/24/15   Time 8   Period Weeks   Status Achieved   PT LONG TERM GOAL #7   Title Pt will ambulate on all outdoor surfaces without AD with AFO at mod I level  in order to indicate improved gait pattern and safety in returning to more challenging leisure activities.  (Target Date: 06/24/15)   Baseline met  06/24/15   Status Achieved   PT LONG TERM GOAL #8   Title Pt will demonstrate 3+/5 R hamstring strength with decreased hip/trunk compensation to indicate improved  and more efficient gait pattern.  (Target Date: 06/24/15)   Baseline met (3+/5 on 06/24/15)   Status Achieved               Plan - 06/24/15 0850    Clinical Impression Statement Skilled session focused on assessment of LTG's and DC.  Pt has met 6/8 LTG's, partially meeting another goal (improvement in SIS mobility score) but was unable to meet DF goal due to slightly weaker.  This may be due to botox injection recently.   Feel that pt ready for D/C based on goals and progress.  Pt in agreement.    Pt will benefit from skilled therapeutic intervention in order to improve on the following deficits Abnormal gait;Decreased balance;Decreased coordination;Decreased knowledge of precautions;Decreased safety awareness;Decreased strength;Difficulty walking;Impaired perceived functional ability;Impaired sensation;Impaired flexibility;Impaired tone;Impaired UE functional use;Postural dysfunction;Decreased knowledge of use of DME;Pain   Rehab Potential Excellent   Clinical Impairments Affecting Rehab Potential adhering to safety precautions   PT Frequency 2x / week   PT Duration Other (comment)  until 06/24/15   PT Treatment/Interventions ADLs/Self Care Home Management;Electrical Stimulation;Biofeedback;DME Instruction;Gait training;Stair training;Functional mobility training;Therapeutic exercise;Balance training;Neuromuscular re-education;Patient/family education;Manual techniques;Visual/perceptual remediation/compensation   PT Next Visit Plan na   Consulted and Agree with Plan of Care Patient       PHYSICAL THERAPY DISCHARGE SUMMARY  Visits from Start of Care: 32  Current functional level related to goals / functional outcomes: See LTGs   Remaining deficits: Pt continues to have RLE spastic hemiparesis with abnormal gait, however  has made marked progress since being in therapy.     Education / Equipment: HEP, return to leisure activity  Plan: Patient agrees to discharge.  Patient goals were partially met. Patient is being discharged due to meeting the stated rehab goals.  ?????        Problem List Patient Active Problem List   Diagnosis Date Noted  . Spastic hemiplegia affecting right dominant side ([place]) 04/13/2015  . 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 Left middle cerebral artery stroke ([place]) 10/21/2014  . Right hemiparesis ([place]) 10/21/2014  . Aphasia due to stroke 10/21/2014    Cameron Sprang, PT, MPT [facility] [address] [place], [place], [address] Phone: [phone]   Fax:  [phone] 06/24/2015, 12:11 PM  Name: Mario [name] MRN: [id number] Date of Birth: [birth year]

## 2015-06-25 ENCOUNTER — Encounter: Payer: BC Managed Care – PPO | Attending: Physical Medicine & Rehabilitation

## 2015-06-25 ENCOUNTER — Encounter: Payer: Self-pay | Admitting: Physical Medicine & Rehabilitation

## 2015-06-25 ENCOUNTER — Ambulatory Visit (HOSPITAL_BASED_OUTPATIENT_CLINIC_OR_DEPARTMENT_OTHER): Payer: BC Managed Care – PPO | Admitting: Physical Medicine & Rehabilitation

## 2015-06-25 VITALS — BP 146/91 | HR 69 | Resp 16

## 2015-06-25 DIAGNOSIS — I10 Essential (primary) hypertension: Secondary | ICD-10-CM | POA: Insufficient documentation

## 2015-06-25 DIAGNOSIS — I69320 Aphasia following cerebral infarction: Secondary | ICD-10-CM | POA: Diagnosis not present

## 2015-06-25 DIAGNOSIS — I69390 Apraxia following cerebral infarction: Secondary | ICD-10-CM

## 2015-06-25 DIAGNOSIS — G8111 Spastic hemiplegia affecting right dominant side: Secondary | ICD-10-CM

## 2015-06-25 DIAGNOSIS — Z8673 Personal history of transient ischemic attack (TIA), and cerebral infarction without residual deficits: Secondary | ICD-10-CM | POA: Diagnosis not present

## 2015-06-25 DIAGNOSIS — R482 Apraxia: Secondary | ICD-10-CM

## 2015-06-25 DIAGNOSIS — IMO0002 Reserved for concepts with insufficient information to code with codable children: Secondary | ICD-10-CM

## 2015-06-25 MED ORDER — CITALOPRAM HYDROBROMIDE 10 MG PO TABS: 10.0000 mg | ORAL_TABLET | Freq: Every day | ORAL | Status: DC

## 2015-06-25 NOTE — Progress Notes (Signed)
Subjective:    Patient ID: Mario [name], male    DOB: [birth year], 40 y.o.   MRN: [id number]  HPI 11/28 Botox Biceps100 FCR25 FCU25 FDS25 FDP25 Brachioradialis 25 Tib post 75  OT reviewed, still has excessive tone in the biceps and finger flexors. Difficulty with reaching. Patient has some mild improvements with his foot turning over.  In addition OT notes the patient has had some crying episodes. I spoke to the patient as well as his significant other. He is a phasic therefore cannot do any type of depression screens. He's had decreased appetite, his sleep is good however. He does admit to depressed mood, he shows some mild lability in the office.  Pain Inventory Average Pain 5 Pain Right Now 5 My pain is tingling  In the last 24 hours, has pain interfered with the following? General activity 3 Relation with others 3 Enjoyment of life 3 What TIME of day is your pain at its worst? morning Sleep (in general) Good  Pain is worse with: unsure Pain improves with: rest and therapy/exercise Relief from Meds: 6  Mobility walk without assistance ability to climb steps?  yes do you drive?  yes Do you have any goals in this area?  yes  Function disabled: date disabled .  Neuro/Psych numbness tingling trouble walking confusion depression  Prior Studies Any changes since last visit?  no  Physicians involved in your care Any changes since last visit?  no   History reviewed. No pertinent family history. Social History   Social History  . Marital Status: Married    Spouse Name: N/A  . Number of Children: N/A  . Years of Education: N/A   Social History Main Topics  . Smoking status: Never Smoker   . Smokeless tobacco: None  . Alcohol Use: No  . Drug Use: No  . Sexual Activity: Not Asked   Other Topics Concern  . None   Social History Narrative   Past Surgical History  Procedure Laterality Date  . No past surgeries     Past Medical History    Diagnosis Date  . Hypertension    BP 146/91 mmHg  Pulse 69  Resp 16  SpO2 99%  Opioid Risk Score:   Fall Risk Score:  `1  Depression screen PHQ 2/9  Depression screen PHQ 2/9 04/13/2015  Decreased Interest 0  Down, Depressed, Hopeless 1  PHQ - 2 Score 1  Altered sleeping 0  Tired, decreased energy 0  Change in appetite 1  Feeling bad or failure about yourself  1  Trouble concentrating 1  Moving slowly or fidgety/restless 1  Suicidal thoughts 0  PHQ-9 Score 5     Review of Systems  All other systems reviewed and are negative.      Objective:   Physical Exam Motor strength is 4 minus at the deltoid biceps 3 minus tricep 3 minus finger flexors Ashworth grade 3 in the thumb flexor, Ashworth 2 at the long finger flexors digits 2 and 3, Ashworth grade 1 at the long finger flexors digits 4 and 5 Wrist Ashworth grade 0 Biceps, elbow flexors, Ashworth grade 2  Sensation reduced in the right upper and right lower limb  In stand with foot plantigrade. No evidence of inversion. He does have a tight heel cord on the right side. No excessive toe flexion during standing. Does have some decreased passive extension of the toes. No active toe flexion or extension. Patient has tone causing inversion with attempted dorsiflexion of  the foot. Plantar flexion is 0 Knee extension is 4+ hip flexion 4+  Speech single answer yes no not 100% accurate, we'll follow commands but has some element of apraxia during manual muscle testing.       Assessment & Plan:  1. 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 He does have emotional  lability. In either case SSRI would be helpful, start Celexa 10 mg daily, may need to go up to 20 g in 2 months if not particularly effective. Went over risks and benefits.  Over half of the 25 min visit was spent counseling and coordinating care.Spoke to the patient as well as his significant other.

## 2015-06-25 NOTE — Patient Instructions (Signed)
Next Botox will be with a higher dose.

## 2015-06-25 NOTE — Addendum Note (Signed)
Addended by: Erick Colace on: 06/25/2015 12:48 PM   Modules accepted: Level of Service

## 2015-06-29 ENCOUNTER — Encounter: Payer: Self-pay | Admitting: Occupational Therapy

## 2015-06-29 ENCOUNTER — Telehealth: Payer: Self-pay | Admitting: *Deleted

## 2015-06-29 ENCOUNTER — Ambulatory Visit: Payer: BC Managed Care – PPO | Admitting: Occupational Therapy

## 2015-06-29 DIAGNOSIS — R4189 Other symptoms and signs involving cognitive functions and awareness: Secondary | ICD-10-CM

## 2015-06-29 DIAGNOSIS — R201 Hypoesthesia of skin: Secondary | ICD-10-CM

## 2015-06-29 DIAGNOSIS — IMO0002 Reserved for concepts with insufficient information to code with codable children: Secondary | ICD-10-CM

## 2015-06-29 DIAGNOSIS — M6281 Muscle weakness (generalized): Secondary | ICD-10-CM

## 2015-06-29 DIAGNOSIS — G8111 Spastic hemiplegia affecting right dominant side: Secondary | ICD-10-CM

## 2015-06-29 DIAGNOSIS — R269 Unspecified abnormalities of gait and mobility: Secondary | ICD-10-CM | POA: Diagnosis not present

## 2015-06-29 NOTE — Telephone Encounter (Signed)
Caller identified as Tyeson Tanimoto called saying "Simeon is in therapy "there" requesting a call from Dr Wynn Banker to discuss his speech (therapy?) after 4 pm one day. Aram Beecham is not listed in Sarim's DPR as a contact and no mention of her in family list. We will not be able to speak to her or call for this reason.

## 2015-06-29 NOTE — Patient Instructions (Addendum)
Upgraded HEP to 5 pounds for chest presses and overhead in supine, 12-15 reps x2 twice a day.   Lay on back with arms stretched out so your body makes a T, palms facing up.  Bend knees. Slowly bring knees first to the left and hold for count of 20 then to the right and hold for count of 20. Alternate sides, doing 5 on each side.  Do in the morning and then at night.    Continue to try and USE THE ARM as much as possible functionally!!  Spend 10 minutes in the morning and 10 minutes in the afternoon doing assisted reach from LOW surfaces!!

## 2015-06-29 NOTE — Therapy (Signed)
[facility] [address] [place], [place], [address] Phone: [phone]   Fax:  [phone]  Occupational Therapy Treatment  Patient Details  Name: Mario [name] MRN: [id number] Date of Birth: 1975/09/10 Referring Provider: Blane Ohara  Encounter Date: 06/29/2015      OT End of Session - 06/29/15 1633    Visit Number 3   Number of Visits 16   Date for OT Re-Evaluation 08/17/15   Authorization Type [employer] state heatlh PPO -30 visits combined for OT/PT   Authorization - Visit Number 7   Authorization - Number of Visits 30   OT Start Time 1101   OT Stop Time 1145   OT Time Calculation (min) 44 min   Activity Tolerance Patient tolerated treatment well      Past Medical History  Diagnosis Date  . Hypertension     Past Surgical History  Procedure Laterality Date  . No past surgeries      There were no vitals filed for this visit.  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 No/denies                      OT Treatments/Exercises (OP) - 06/29/15 0001    Neurological Re-education Exercises   Other Exercises 1 Neuro re ed to address upgrading home program for UE.  See pt instructions for details. Pt able to return demonstrate and provided in writing. Also adressed mid reach with grasp and relase and use of  UE in functional tasks. Pt to have botox injection on 07/23/2015. Given limited visits pt will be placed on hold until 3 weeks post injection in order to maximize benefit of therapy appointments. Pt in agreement.                  OT Education - 06/29/15 1314    Education provided Yes   Education Details upgraded HEP for UE   Person(s) Educated Patient   Methods Explanation;Demonstration;Verbal cues;Handout   Comprehension Verbalized understanding;Returned demonstration          OT Short Term Goals - 06/29/15 1315    OT SHORT TERM GOAL #5   Status --  pt able to use as gross assist for some parts of ADL activity   OT SHORT TERM GOAL  #10   TITLE Pt will be mod I with upgraded HEP - 07/20/2015 (date adjusted as pt missed 2 weeks of therapy due to scheduling issues   Status On-going   OT SHORT TERM GOAL  #11   TITLE Pt will be able to use R hand as non dominant assist in at least two functional tasks. 07/20/2015 (date adjusted as pt missed 2 weeks of therapy due to scheduling issues)   Status On-going   OT SHORT TERM GOAL  #12   TITLE Pt will be able to pick up and release small objects at table top activity with min compensations and vc's only.   Status On-going           OT Long Term Goals - 06/29/15 1315  OT LONG TERM GOAL #6   Title --  moved to [facility] on 03/30/2015   OT LONG TERM GOAL  #12   TITLE Pt will be mod I with upgraded HEP - 08/17/2015 (date adjusted due to pt missed 2 weeks of therapy due to scheduling issues)   Status On-going   OT LONG TERM GOAL  #13   TITLE Pt will demonstrate ability to use RUE as non dominant at least 50% in basic self care tasks.    Status On-going   OT LONG TERM GOAL  #14   TITLE Pt will demonstrate ability to pick up at least 2 pound object in higher cabinet with RUE with min compensations only.    Status On-going   OT LONG TERM GOAL  #15   TITLE Pt will be able to dial cell phone with R hand with increased time and no more than min assist.    Status On-going               Plan - 06/29/15 1315    Clinical Impression Statement Pt continues ot make progress however tone interferes wtih use of RUE - pt to have botox injection  07/23/2015 wil be placed on hold until after injection. Pt in agreement.   Pt will benefit from skilled therapeutic intervention in order to improve on the following deficits (Retired) Abnormal gait;Decreased balance;Decreased coordination;Decreased cognition;Decreased knowledge of use of DME;Decreased mobility;Decreased range of motion;Decreased strength;Increased edema;Impaired UE functional use;Impaired tone;Impaired sensation;Pain   Rehab Potential Good   Clinical Impairments Affecting Rehab Potential global aphasia, apraxia, impaired sensation   OT Frequency 2x / week   OT Duration 8 weeks   OT Treatment/Interventions Self-care/ADL training;Ultrasound;Moist Heat;Electrical Stimulation;DME and/or AE instruction;Neuromuscular education;Therapeutic exercise;Functional Mobility Training;Manual Therapy;Passive range of motion;Splinting;Therapeutic activities;Balance training;Patient/family education;Cognitive remediation/compensation   Plan on hold until after botox injection   Consulted and Agree with Plan of Care Patient        Problem List Patient Active Problem List   Diagnosis Date Noted  . Spastic hemiplegia affecting right dominant side (HCC) 04/13/2015  . 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 Right hemiparesis (HCC) 10/21/2014  . Aphasia due to stroke 10/21/2014    Norton Pastel, OTR/L 06/29/2015, 4:36 PM  [facility] [address] [place], [place], [address] Phone: [phone]   Fax:  [phone]  Name: Mario [name] MRN: [id number] Date of Birth: 07-Mar-1976

## 2015-07-01 ENCOUNTER — Encounter: Payer: BC Managed Care – PPO | Admitting: Occupational Therapy

## 2015-07-03 ENCOUNTER — Telehealth: Payer: Self-pay

## 2015-07-03 NOTE — Telephone Encounter (Signed)
Misty Stanley called in regards to pt's rx for Celexa. Left message to let pt know his Celexa was sent to the [facility] in [place] on 06/25/15.

## 2015-07-06 ENCOUNTER — Encounter: Payer: BC Managed Care – PPO | Admitting: Occupational Therapy

## 2015-07-08 ENCOUNTER — Encounter: Payer: BC Managed Care – PPO | Admitting: Occupational Therapy

## 2015-07-13 ENCOUNTER — Encounter: Payer: BC Managed Care – PPO | Admitting: Occupational Therapy

## 2015-07-15 ENCOUNTER — Encounter: Payer: BC Managed Care – PPO | Admitting: Occupational Therapy

## 2015-07-19 ENCOUNTER — Encounter: Payer: BC Managed Care – PPO | Admitting: Occupational Therapy

## 2015-07-19 ENCOUNTER — Ambulatory Visit: Payer: BC Managed Care – PPO | Attending: Family Medicine

## 2015-07-19 DIAGNOSIS — R4701 Aphasia: Secondary | ICD-10-CM | POA: Diagnosis present

## 2015-07-19 DIAGNOSIS — R482 Apraxia: Secondary | ICD-10-CM

## 2015-07-19 NOTE — Therapy (Addendum)
Mario [name] [address] [place], [place], [address] Phone: [phone]   Fax:  [phone]  Speech Language Pathology Treatment  Patient Details  Name: Mario [name] MRN: [id number] Date of Birth: 1975/10/11 No Data Recorded  Encounter Date: 07/19/2015      End of Session - 07/19/15 1709    Visit Number 32   Number of Visits 40   Date for SLP Re-Evaluation 08/03/15   SLP Start Time 1147   SLP Stop Time  1230   SLP Time Calculation (min) 43 min   Activity Tolerance Patient tolerated treatment well      Past Medical History  Diagnosis Date  . Hypertension     Past Surgical History  Procedure Laterality Date  . No past surgeries      There were no vitals filed for this visit.  Visit Diagnosis: Expressive aphasia  Receptive aphasia  Verbal apraxia      Subjective Assessment - 07/19/15 1319    Subjective Pt arrives with iPad with speech generating app installed. He reports using it approx once a day since receiving it nearly a month ago. Pt has not been seen in approx one month so re-evaluation is necessary.               ADULT SLP TREATMENT - 07/19/15 1208    General Information   Behavior/Cognition Alert;Cooperative;Pleasant mood   Treatment Provided   Treatment provided Cognitive-Linquistic   Cognitive-Linquistic Treatment   Treatment focused on Aphasia;Apraxia   Skilled Treatment Pt at table with iPad and sofftware/app began after pt opened up FaceTime. Noticed error and then started appropriate app. Engaged with SLP-initiated conversation with min A from SLP rarely for simple 1-4 word responses to SLP questions about weekend. Pt attempted verbal one-two word responses but either paraphasic errors [employer]" for "house") or apraxic ("e-gur-ghen-ghee" for "emergency"). Pt told SLP he was functional with current expressive language, but not satisfied with it. See other documentation below for  further details of today's session. 4/8 in simple sentence reading comprehension, and 7/10 with mod-complex auditory comp at sentence level with picture cues (f:4 to choose). Pt requested repeats of stimulus sentence when necessary. Word ID (f:4) from picture was 90% successful.      CURRENT COMMUNICATION IMPAIRMENT Jaman is unable to functionally communicate verbally and/or in written means due to severe expressive aphasia and apraxia.   A. Severity Najee's needs cannot be met using natural communication methods or low-technology speaking aids.  His speech impairment is judged to be stable and chronic in nature.  B. Anticipated Course of Impairment : STAGE 5 Stage 1: No detectable speech disorder Stage 2: Obvious speech disorder, intelligible Stage 3: Reduction in speech intelligibility Stage 4: Natural speech supplemented with Speech Generating Device Stage 5:   No useful speech (Speech Generating Device only)  C. Language Skills Assessment today reveals oral and written language skills are well below functional limits.  Receptively, Corderro is able to comprehend simple conversational speech and mod complex conversation with requested repeats. In portions of the [facility] Diagnostic Aphasia Examination, pt scored 7/10 in min-mod complex oral sentence comprehension, and 4/8 for reading comprehension in simple sentence fill ins (answers were multiple choice f:4).  Expressively, Jamarie's primary means of communication include speaking single words to 2-3 word phrases, consistently misarticulated due to aphasia/apraxia, and using basic, mostly non-specific gestures. He has shown limited success with typing/writing words which are NOT family names, and no success with writing grammatically correct,  syntactically simple sentences.   D. Cognitive Ability Da retains simple task instructions without difficulty.  He spontaneously uses strategies (requesting repetition from speaker) to aid message  comprehension. Today, he demonstrated the ability to use a tablet using TouchChat HD with WordPower with cueing from SLP. He possesses cognitive and linguistic abilities to use SGD to communicate functionally.  E. Vision Status Lemmie demonstrates no problems with visual attention, scanning, tracking, or acuity. He was able to discriminate text on communication boards, a display positioned at midline, at a distance of approximately 18", without difficulty.  Hilmer possesses the visual abilities to effectively use a SGD to communicate functionally.  F. Hearing Status No problems with hearing noted or reported.  Josiah's hearing appeared functional for communication in one on one settings.  Hearing was also functional for the recognizing and comprehending the synthesized speech of the  SGD.  DAILY COMMUNICATION NEEDS G. Specific Daily Living Functional Communication Needs  Using the speech-generating device (SGD), Therin will have the ability: 1. To participate in activities promoting psychosocial well being such as emergency situations and community activities. 2. To participate in decision-making, leisure activities, and to maintain phone contact with extended family and friends. 3. To report medical status and complaints, to ask questions of medical providers, and to discuss choices for medical care and communicate with medical providers by phone. 4. To fulfill family and social roles such as participating in group and church activities, shopping, telephone access and communication with medical personnel.   H. Ability to Use Low Tech Strategies Eladio has trialed two low-tech augmentative/alternative communication means:                 1. Letterboard                2. Communication board with pictures  Samantha demonstrated literacy skills that would not make a letter board functional means of expressive communication due to his aphasia. Further, reliance on this type of system restricts the  choice of communication partners to person familiar with Awanda Mink.   The use of a communication board with pictures was more functional however is extremely cumbersome as someone of Vallen's cognitive ability would need to carry volumes of pictures to make this means a useful one for him. The organization and memorization of this system of pictures would render this means non-functional. The use of a SGD would provide him with the ability to independently communicate social, vocational, self-care, and medical needs in an efficient manner.   I. Ability to Use Speech Generating Device During the evaluation, Logyn trialed other means other than a letter board in order to determine which device would most efficiently and effectively permits him to communicate his needs.  The following devices were considered and/or trialed:    1. Choice Communicator with TouchChat HD 2. NovaChat 10 with WordPower 3. ChatFusion 10 with WordPower    The tablet using TouchChat HD with WordPower was investigated as an option due to its simple means of user interface (combination of pictures and simple wording), as well as the ability to convey the myriad of messages Jayro could communicate if he were able to speak as he did premorbidly.  Rollo was able to access the communication device to have a simple conversation with the SLP about his weekend with extra time, without the SLP having to ask follow up questions more than once. This type of conversation would have never been possible based upon past therapy sessions. He was able to use the  pictures and create novel messages with rare min A from this SLP.    III. USE OF THE DEVICE A. Cognitive During this evaluation, Math presented with the basic cognitive and language skills required in order to access and use the SGD.    B. Physical Javanni only has functional use of his left hand at this time but demonstrated the ability to access the SGD using the touchscreen on the  tablet with that hand. He demonstrated the ability to navigate around a screen and to other screens on the SGD with assistance from SLP.   IV. RATIONAL FOR DEVICE SELECTION Based on the above assessment, daily communication needs, and functional communication goals, Thaine requires the use of a SGD with the following features:  Input/Message Characteristic Features:   ?  Competent language system to support both language use and encoding   ?  Pictures along with word selection, instead of words-only selections ?  Access to stored messages and selections on dynamic display pages   ?  High quality display ?  Icon encoding capability  Output:   ?  Synthesized speech output to create novel messages   ?  Ability to be heard clearly when accessed near the phone   ?  Capability to facilitate communication at a distance  Other Features:   ?  Long battery life to get through the day in various locations   ?  Ability to be accessed in all communication settings   ?  Means to charge the device (plug-in vs. charging base) ?  Portable enough to allow communication in various locations including       [place] appointments  V. Device Trial   Marlos had opportunity to trial a tablet using the [employer] HD with [employer] for a period of 5 weeks. During this time Khari learned how to utilize the communication system to meet his daily needs, how to navigate the device, and how to create novel utterances to make requests and comment in conversation. It was also reported that he did have difficulty charging the device due to his inability to use both hands to plug the charger into the tablet.        VI.       Recommendation  Travers requires the use of a ChatFusion 10 with WordPower to meet his daily communication needs.  The SGD was selected because of the function of a charging base instead of a Medical sales representative, his ability to use the software with much greater efficiency than verbal  communication, the static core vocabulary, language representation techniques, and text-picture selections.  Others above were considered because of their use of the [place], but the other devices did not have a charging base to ensure Daelyn could charge the device independently. The ChatFusion 10 would be purchased solely for Chanler's use. It should also be noted that Devery's doctor, Dr. Alysia Penna, has been involved in his care from the very beginning and has been informed of Jarred's need for augmented communication. Dr. Letta Pate' order for an Alternative and Augmentative Communicaiton eval was recorded on 05-11-15.   VI. PATIENT AND FAMILY SUPPORT OF SGD Devion demonstrates motivation to maintain the SGD.  He has established basic skills using the device. He has basic understanding of how the SGD operates.  VII. INTERVENTION SCHEDULE It is recommended that Awanda Mink follow up either here or with Leandro Reasoner at the [facility] to assist with further set-up and training with the SGD.  SLP Short Term Goals - 05/18/15 1147    SLP SHORT TERM GOAL #6   Title pt will provide functional multimodal responses (except writing) in simple conversation with occasional min A   Baseline for 1 weeks or 3 more visits, for all STGs   Time 1   Period Weeks   Status Not Met   SLP SHORT TERM GOAL #7   Title pt will demo understanding of mod complex conversation with repeats allowed 90%   Time 1   Period Weeks   Status Not Met   SLP SHORT TERM GOAL #8   Title pt will write one word responses to communicate functionally, with multiple attempts allowed, achieving 75% listener comprehension   Status Achieved          SLP Long Term Goals - 07/19/15 1712    SLP LONG TERM GOAL #1   Title pt will demo undertanding of simple/mod complex conversational questions via yes/no responses 80% accuracy   Period --  or 16 visits - for all LTGs   Status  Achieved   SLP LONG TERM GOAL #2   Title pt will demo undertanding of simple conversational quesitons via yes/no responses 90% accuracy   Status Achieved   SLP LONG TERM GOAL #3   Title pt will perform rote speech tasks simultaneously with SLP with 40% accuracy (functional responses allowed)   Status Not Met   SLP LONG TERM GOAL #4   Title pt will imitate salient words/family names with 20% success   Status Achieved   SLP LONG TERM GOAL #5   Title pt will demo use of simple alternative/augmentative communcation and/or multimodal communication with 80% success in mod complex message conveyance   Baseline 5 weeks or 7 more visits   Time 1   Period Weeks   Status Achieved   SLP LONG TERM GOAL #6   Title pt will state family names/information and other salient personal 2-3 word information with 80% success   Time 3   Period Weeks  or visits, LTGs 6 and 7   Status Deferred  due to focus on SGD goal/s 07-19-15   SLP LONG TERM GOAL #7   Title pt will access a device with functional success for simple "wh" questions in conversation 80% success   Time 8   Period Weeks  or 8 sessions, including today (07-19-15)   Status On-going          Plan - 07/19/15 1709    Clinical Impression Statement Pt would benefit from iPad with speech generating device (SGD) app/software installed. See narrative included. Pt may need to return to this center for further basic training with device - waiting to hear from specialist for SGD for clarification. Last LTG for future sessions will remain enforce in case pt returns to this center for SGD training.   Speech Therapy Frequency 1x /week   Duration --  8 weeks (or eight sessions), including today's (07-19-15) session   Treatment/Interventions SLP instruction and feedback;Compensatory strategies;Internal/external aids;Environmental controls;Patient/family education;Functional tasks;Cueing hierarchy;Multimodal communcation approach   Potential to Achieve Goals  Fair   Potential Considerations Severity of impairments        Problem List Patient Active Problem List   Diagnosis Date Noted  . Spastic hemiplegia affecting right dominant side ([place]) 04/13/2015  . 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 Left middle cerebral artery stroke ([place]) 10/21/2014  . Right hemiparesis ([place]) 10/21/2014  . Aphasia due to stroke 10/21/2014    [facility] ,MS,  CCC-SLP  07/19/2015, 5:21 PM  [facility] [address] [place], [place], [address] Phone: [phone]   Fax:  [phone]   Name: GILLIS BOARDLEY MRN: [id number] Date of Birth: 07/01/1975

## 2015-07-19 NOTE — Patient Instructions (Signed)
We will call to tell you if you need to come back.  We need to hear from [facility] first.

## 2015-07-20 ENCOUNTER — Encounter: Payer: BC Managed Care – PPO | Admitting: Occupational Therapy

## 2015-07-22 ENCOUNTER — Ambulatory Visit: Payer: BC Managed Care – PPO

## 2015-07-23 ENCOUNTER — Encounter: Payer: Self-pay | Admitting: Physical Medicine & Rehabilitation

## 2015-07-23 ENCOUNTER — Ambulatory Visit (HOSPITAL_BASED_OUTPATIENT_CLINIC_OR_DEPARTMENT_OTHER): Payer: BC Managed Care – PPO | Admitting: Physical Medicine & Rehabilitation

## 2015-07-23 ENCOUNTER — Encounter: Payer: BC Managed Care – PPO | Attending: Physical Medicine & Rehabilitation

## 2015-07-23 ENCOUNTER — Telehealth: Payer: Self-pay | Admitting: *Deleted

## 2015-07-23 VITALS — BP 144/90 | HR 65 | Resp 14

## 2015-07-23 DIAGNOSIS — I69320 Aphasia following cerebral infarction: Secondary | ICD-10-CM

## 2015-07-23 DIAGNOSIS — I10 Essential (primary) hypertension: Secondary | ICD-10-CM | POA: Insufficient documentation

## 2015-07-23 DIAGNOSIS — I63512 Cerebral infarction due to unspecified occlusion or stenosis of left middle cerebral artery: Secondary | ICD-10-CM | POA: Diagnosis not present

## 2015-07-23 DIAGNOSIS — G8111 Spastic hemiplegia affecting right dominant side: Secondary | ICD-10-CM | POA: Insufficient documentation

## 2015-07-23 DIAGNOSIS — Z8673 Personal history of transient ischemic attack (TIA), and cerebral infarction without residual deficits: Secondary | ICD-10-CM | POA: Diagnosis not present

## 2015-07-23 DIAGNOSIS — G8191 Hemiplegia, unspecified affecting right dominant side: Secondary | ICD-10-CM

## 2015-07-23 DIAGNOSIS — IMO0002 Reserved for concepts with insufficient information to code with codable children: Secondary | ICD-10-CM

## 2015-07-23 NOTE — Patient Instructions (Signed)

## 2015-07-23 NOTE — Telephone Encounter (Signed)
Received another call from someone identifying themselves as "Andrena Mews mother" asking about celexa and ?astien 325---?aspirin? And if they should have picked that up.  There is no one listed in his DPR except his father and pt is aphasic.

## 2015-07-23 NOTE — Progress Notes (Signed)
Botox Injection for spasticity using needle EMG guidance  Dilution: 50 Units/ml Indication: Severe spasticity which interferes with ADL,mobility and/or  hygiene and is unresponsive to medication management and other conservative care Informed consent was obtained after describing risks and benefits of the procedure with the patient. This includes bleeding, bruising, infection, excessive weakness, or medication side effects. A REMS form is on file and signed. Needle: 25g 2" needle electrode Number of units per muscle Biceps150 FCR25 FCU25 FDS25 FDP50 Brachioradialis 50 Tib post 75  All injections were done after obtaining appropriate EMG activity and after negative drawback for blood. The patient tolerated the procedure well. Post procedure instructions were given. A followup appointment was made.

## 2015-07-27 ENCOUNTER — Encounter: Payer: BC Managed Care – PPO | Admitting: Occupational Therapy

## 2015-07-29 ENCOUNTER — Encounter: Payer: BC Managed Care – PPO | Admitting: Occupational Therapy

## 2015-08-03 ENCOUNTER — Encounter: Payer: BC Managed Care – PPO | Admitting: Occupational Therapy

## 2015-08-05 ENCOUNTER — Encounter: Payer: BC Managed Care – PPO | Admitting: Occupational Therapy

## 2015-08-09 ENCOUNTER — Encounter: Payer: BC Managed Care – PPO | Admitting: Occupational Therapy

## 2015-08-12 ENCOUNTER — Encounter: Payer: BC Managed Care – PPO | Admitting: Occupational Therapy

## 2015-08-16 ENCOUNTER — Encounter: Payer: BC Managed Care – PPO | Admitting: Occupational Therapy

## 2015-08-19 ENCOUNTER — Encounter: Payer: BC Managed Care – PPO | Admitting: Occupational Therapy

## 2015-08-23 ENCOUNTER — Encounter: Payer: BC Managed Care – PPO | Admitting: Occupational Therapy

## 2015-08-23 ENCOUNTER — Encounter: Payer: Self-pay | Admitting: Occupational Therapy

## 2015-08-23 DIAGNOSIS — G8111 Spastic hemiplegia affecting right dominant side: Secondary | ICD-10-CM

## 2015-08-23 NOTE — Therapy (Signed)
[facility] [address] [place], [place], [address] Phone: [phone]   Fax:  [phone]  Occupational Therapy Treatment  Patient Details  Name: Mario [name] MRN: [id number] Date of Birth: 05/08/1976 Referring Provider: Rochel Brome  Encounter Date: 08/23/2015    Past Medical History  Diagnosis Date  . Hypertension     Past Surgical History  Procedure Laterality Date  . No past surgeries      There were no vitals filed for this visit.  Visit Diagnosis:  Spastic hemiplegia affecting right dominant side ([place])                              OT Short Term Goals - 08/23/15 1236    OT SHORT TERM GOAL #5   Status --  pt able to use as gross assist for some parts of ADL activity   OT SHORT TERM GOAL  #10   TITLE Pt will be mod I with upgraded HEP - 07/20/2015 (date adjusted as pt missed 2 weeks of therapy due to scheduling issues   Status Not Met   OT SHORT TERM GOAL  #11   TITLE Pt will be able to use R hand as non dominant assist in at least two functional tasks. 07/20/2015 (date adjusted as pt missed 2 weeks of therapy due to scheduling issues)   Status Not Met   OT SHORT TERM GOAL  #12   TITLE Pt will be able to pick up and release small objects at table top activity with min compensations and vc's only.   Status Not Met           OT Long Term Goals - 08/23/15 1237    OT LONG TERM GOAL #6   Title --  moved to STG on 03/30/2015   OT LONG TERM GOAL  #12   TITLE Pt will be mod I with upgraded HEP - 08/17/2015 (date adjusted due to pt missed 2 weeks of therapy due to scheduling issues)   Status Not Met   OT LONG TERM GOAL  #13   TITLE Pt will demonstrate ability to use RUE as non dominant at least 50% in basic self care tasks.    Status Not Met   OT LONG TERM GOAL  #14   TITLE Pt will demonstrate ability to pick up at least 2 pound object in higher cabinet with RUE with min  compensations only.    Status Not Met   OT LONG TERM GOAL  #15   TITLE Pt will be able to dial cell phone with R hand with increased time and no more than min assist.    Status Not Met               Plan - 08/23/15 1237    Clinical Impression Statement Pt was placed on hold due to botox injection  and then tranferred all care to [facility] due to transportation issues.     Pt will benefit from skilled therapeutic intervention in order to improve on the following deficits (Retired) Abnormal gait;Decreased balance;Decreased coordination;Decreased cognition;Decreased knowledge of use of DME;Decreased mobility;Decreased range of motion;Decreased strength;Increased edema;Impaired UE functional use;Impaired tone;Impaired sensation;Pain   Clinical Impairments Affecting Rehab Potential global aphasia, apraxia, impaired sensation   OT Frequency 2x / week   OT Duration 8 weeks   OT Treatment/Interventions Self-care/ADL training;Ultrasound;Moist Heat;Electrical Stimulation;DME and/or AE instruction;Neuromuscular education;Therapeutic exercise;Functional Mobility Training;Manual Therapy;Passive range  of motion;Splinting;Therapeutic activities;Balance training;Patient/family education;Cognitive remediation/compensation   Plan d/c as pt no longer has transportation to clinic        Problem List Patient Active Problem List   Diagnosis Date Noted  . Spastic hemiplegia affecting right dominant side ([place]) 04/13/2015  . 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 Left middle cerebral artery stroke ([place]) 10/21/2014  . Right hemiparesis ([place]) 10/21/2014  . Aphasia due to stroke 10/21/2014   OCCUPATIONAL THERAPY DISCHARGE SUMMARY  Visits from Start of Care: 35  Current functional level related to goals / functional outcomes: See above.  Pt met all prior goals and new goals were established however pt did not return to clinic due to transportation issues.    Remaining  deficits: See above   Education / Equipment: HEP, splint  Plan: Patient agrees to discharge.  Patient goals were not met. Patient is being discharged due to not returning since the last visit.  ?????      Quay Burow, OTR/L 08/23/2015, 12:40 PM  [facility] [address] [place], [place], [address] Phone: [phone]   Fax:  [phone]  Name: DIN BOOKWALTER MRN: [id number] Date of Birth: Feb 20, 1976

## 2015-08-26 ENCOUNTER — Encounter: Payer: BC Managed Care – PPO | Admitting: Occupational Therapy

## 2015-08-30 ENCOUNTER — Encounter: Payer: BC Managed Care – PPO | Admitting: Occupational Therapy

## 2015-09-02 ENCOUNTER — Encounter: Payer: BC Managed Care – PPO | Admitting: Occupational Therapy

## 2015-09-03 ENCOUNTER — Encounter: Payer: BC Managed Care – PPO | Attending: Physical Medicine & Rehabilitation

## 2015-09-03 ENCOUNTER — Encounter: Payer: Self-pay | Admitting: Physical Medicine & Rehabilitation

## 2015-09-03 ENCOUNTER — Ambulatory Visit (HOSPITAL_BASED_OUTPATIENT_CLINIC_OR_DEPARTMENT_OTHER): Payer: BC Managed Care – PPO | Admitting: Physical Medicine & Rehabilitation

## 2015-09-03 VITALS — BP 139/88 | HR 71 | Resp 14

## 2015-09-03 DIAGNOSIS — I69320 Aphasia following cerebral infarction: Secondary | ICD-10-CM | POA: Diagnosis not present

## 2015-09-03 DIAGNOSIS — I10 Essential (primary) hypertension: Secondary | ICD-10-CM | POA: Diagnosis not present

## 2015-09-03 DIAGNOSIS — Z8673 Personal history of transient ischemic attack (TIA), and cerebral infarction without residual deficits: Secondary | ICD-10-CM | POA: Diagnosis not present

## 2015-09-03 DIAGNOSIS — G8191 Hemiplegia, unspecified affecting right dominant side: Secondary | ICD-10-CM | POA: Diagnosis not present

## 2015-09-03 DIAGNOSIS — G8111 Spastic hemiplegia affecting right dominant side: Secondary | ICD-10-CM

## 2015-09-03 DIAGNOSIS — I63512 Cerebral infarction due to unspecified occlusion or stenosis of left middle cerebral artery: Secondary | ICD-10-CM | POA: Diagnosis not present

## 2015-09-03 DIAGNOSIS — IMO0002 Reserved for concepts with insufficient information to code with codable children: Secondary | ICD-10-CM

## 2015-09-03 DIAGNOSIS — R482 Apraxia: Secondary | ICD-10-CM

## 2015-09-03 DIAGNOSIS — I69390 Apraxia following cerebral infarction: Secondary | ICD-10-CM

## 2015-09-03 NOTE — Progress Notes (Signed)
Subjective:    Patient ID: Mario [name], male    DOB: 1975/08/08, 40 y.o.   MRN: [id number] 07/23/2015 9:29 AM     Status: Signed       Expand All Collapse All   Botox Injection for spasticity using needle EMG guidance  Dilution: 50 Units/ml Indication: Severe spasticity which interferes with ADL,mobility and/or hygiene and is unresponsive to medication management and other conservative care Informed consent was obtained after describing risks and benefits of the procedure with the patient. This includes bleeding, bruising, infection, excessive weakness, or medication side effects. A REMS form is on file and signed. Needle: 25g 2" needle electrode Number of units per muscle Biceps150 FCR25 FCU25 FDS25 FDP50 Brachioradialis 50 Tib post 75        HPI Patient complaining of dizziness and ringing in the ears since the time of the stroke. Patient feels like it has gotten worse with time.This has not caused any falls. He is still able to hear adequately to participate in a conversation.  He has completed outpatient PT OT and speech therapy. He remains severely aphasic.  He has not returned to work since his stroke which occurred approximately one year ago. Patient gets this when he is sitting standing or laying down. Head motion does not seem to affect this.  At his last visit he underwent Botox injection which was helpful for his elbow flexor spasticity partially helpful for his finger flexor spasticity and helpful for his ankle inverter spasticity. He continues to use an AFO for ambulation but does not require any cane or other assisted device.    Pain Inventory Average Pain NA Pain Right Now 6 My pain is NA  In the last 24 hours, has pain interfered with the following? General activity NA Relation with others NA Enjoyment of life NA What TIME of day is your pain at its worst? NA Sleep (in general) NA  Pain is worse with: NA Pain improves with: NA Relief from Meds:  NA  Mobility walk without assistance ability to climb steps?  yes do you drive?  yes transfers alone  Function employed # of hrs/week NA  Neuro/Psych No problems in this area  Prior Studies Any changes since last visit?  no  Physicians involved in your care Any changes since last visit?  no Primary care .   History reviewed. No pertinent family history. Social History   Social History  . Marital Status: Married    Spouse Name: N/A  . Number of Children: N/A  . Years of Education: N/A   Social History Main Topics  . Smoking status: Never Smoker   . Smokeless tobacco: None  . Alcohol Use: No  . Drug Use: No  . Sexual Activity: Not Asked   Other Topics Concern  . None   Social History Narrative   Past Surgical History  Procedure Laterality Date  . No past surgeries     Past Medical History  Diagnosis Date  . Hypertension    BP 139/88 mmHg  Pulse 71  Resp 14  SpO2 99%  Opioid Risk Score:   Fall Risk Score:  `1  Depression screen PHQ 2/9  Depression screen PHQ 2/9 04/13/2015  Decreased Interest 0  Down, Depressed, Hopeless 1  PHQ - 2 Score 1  Altered sleeping 0  Tired, decreased energy 0  Change in appetite 1  Feeling bad or failure about yourself  1  Trouble concentrating 1  Moving slowly or fidgety/restless 1  Suicidal  thoughts 0  PHQ-9 Score 5    Review of Systems  All other systems reviewed and are negative.      Objective:   Physical Exam MAS 2 at the right biceps/Brachioradialis MAS of 3 at the finger flexors MAS to at the wrist flexors MAS 3 at the lumbricals Motor strength is 3 minus at the deltoid, bicep, tricep finger flexors and 2 minus finger extensors. Right hip flexor 5/5 knee extensor 5/5  Ambulates with right AFO and no evidence of toe drag or knee instability. He has a slightly widened base of support.  Speech he is able to express single words yes is aaccurate but is not able to describe his medical problems. He  points to his ear and uses a spinning motion of his finger to communicate his dizziness concerns. There is no evidence of nystagmus Extraocular motions are intact No evidence of field cut  Patient is unable to name simple objects    Assessment & Plan:  11. 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 He still has some finger flexor spasticity in the right hand and would benefit from additional Botox to the FDS and lumbricals.  Would reduce Botox to the biceps from 150 units to 100 units Continue 50 units brachioradialis 50 units FDS 50 units FDP 25 units FCU 25 units F CR 25 units lumbricals divided between 3 sites 75 units in tibialis posterior  RTC 6 wks  If elbow flexor spasticity is not adequately relieved by above dose may have to augment with muscular cutaneous nerve block with phenol  2. Dizziness and tinnitus patient states that it has been since his stroke these are not typical symptoms. I would like to refer him to neurology for further evaluation. We discussed some symptomatic medications including gabapentin which she had been on in the past but he does not wish to start another medication at this time  3. 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 We will explore [facility] programs for  aphasia.

## 2015-09-03 NOTE — Patient Instructions (Addendum)
I have made a referral to neurology to evaluate your dizziness and ringing in the ears.  The office will call you. It is located at the same place as where you had your  therapy

## 2015-10-15 ENCOUNTER — Encounter: Payer: BC Managed Care – PPO | Attending: Physical Medicine & Rehabilitation

## 2015-10-15 ENCOUNTER — Ambulatory Visit (HOSPITAL_BASED_OUTPATIENT_CLINIC_OR_DEPARTMENT_OTHER): Payer: BC Managed Care – PPO | Admitting: Physical Medicine & Rehabilitation

## 2015-10-15 ENCOUNTER — Encounter: Payer: Self-pay | Admitting: Physical Medicine & Rehabilitation

## 2015-10-15 VITALS — BP 138/102 | HR 74 | Resp 14

## 2015-10-15 DIAGNOSIS — I10 Essential (primary) hypertension: Secondary | ICD-10-CM | POA: Insufficient documentation

## 2015-10-15 DIAGNOSIS — G8111 Spastic hemiplegia affecting right dominant side: Secondary | ICD-10-CM | POA: Diagnosis not present

## 2015-10-15 DIAGNOSIS — I69320 Aphasia following cerebral infarction: Secondary | ICD-10-CM | POA: Insufficient documentation

## 2015-10-15 DIAGNOSIS — Z8673 Personal history of transient ischemic attack (TIA), and cerebral infarction without residual deficits: Secondary | ICD-10-CM | POA: Insufficient documentation

## 2015-10-15 NOTE — Patient Instructions (Signed)

## 2015-10-15 NOTE — Progress Notes (Signed)
Botox Injection for spasticity using needle EMG guidance  Dilution: 50 Units/ml Indication: Severe spasticity which interferes with ADL,mobility and/or  hygiene and is unresponsive to medication management and other conservative care Informed consent was obtained after describing risks and benefits of the procedure with the patient. This includes bleeding, bruising, infection, excessive weakness, or medication side effects. A REMS form is on file and signed. Needle: 25g 50mm needle electrode and 30g  25mm Number of units per muscle 100 units Right Biceps 100 units Right Pectoralis 25 units FDS 25 units FDP 25 units FCU 25 units F CR 25 units lumbricals divided between 3 sites 75 units in tibialis posterior All injections were done after obtaining appropriate EMG activity and after negative drawback for blood. The patient tolerated the procedure well. Post procedure instructions were given. A followup appointment was made.  Pectoralis had some mild to moderate increased in spontaneous motor unit action potential firing.

## 2015-11-16 ENCOUNTER — Encounter: Payer: Self-pay | Admitting: Neurology

## 2015-11-16 ENCOUNTER — Ambulatory Visit (INDEPENDENT_AMBULATORY_CARE_PROVIDER_SITE_OTHER): Payer: BC Managed Care – PPO | Admitting: Neurology

## 2015-11-16 VITALS — BP 134/94 | HR 70 | Ht 72.0 in | Wt 230.0 lb

## 2015-11-16 DIAGNOSIS — I10 Essential (primary) hypertension: Secondary | ICD-10-CM | POA: Diagnosis not present

## 2015-11-16 DIAGNOSIS — E785 Hyperlipidemia, unspecified: Secondary | ICD-10-CM | POA: Insufficient documentation

## 2015-11-16 DIAGNOSIS — G8111 Spastic hemiplegia affecting right dominant side: Secondary | ICD-10-CM

## 2015-11-16 DIAGNOSIS — I63512 Cerebral infarction due to unspecified occlusion or stenosis of left middle cerebral artery: Secondary | ICD-10-CM

## 2015-11-16 MED ORDER — BACLOFEN 10 MG PO TABS: 10.0000 mg | ORAL_TABLET | Freq: Three times a day (TID) | ORAL | Status: DC

## 2015-11-16 NOTE — Progress Notes (Signed)
NEUROLOGY CLINIC NEW PATIENT NOTE  NAME: Mario DrownJerome A Feasel DOB: 1975/08/15 REFERRING PHYSICIAN: Erick ColaceKirsteins, Andrew E, MD  I saw Mario [name] as a new consult in the neurovascular clinic today regarding  Chief Complaint  Patient presents with  . Referral    internal stroke follow up, referrall from  Dr.kristen, ringing in head and right side spasms  .  HPI: Mario [name] is a 40 y.o. male with PMH of HTN who presents as a new patient for a stroke.   He was driving to work on 8/29/565/15/16 at 4pm and had sudden onset right sided pain and then developed aphasia, not able to talk over the phone. He arrived at work and started working. Later, he went to bathroom for urination but passed out there. He was found at 4am with right sided hemiplegia and aphasia. EMS called and [facility]. Due to outside of intervention window, he was [facility] until discharged [facility] on 10/21/14. He finished CIR and then discharged to outpt PT/OT. He continued following with Dr. 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 His right side weakness much improved but still has significant expressive aphasia. Currently he complains of HA, bilateral ear ringing. He still has outpt PT and speech. He was referred to neurology for stroke follow up.    Past Medical History  Diagnosis Date  . Hypertension   . Stroke [facility](HCC)    Past Surgical History  Procedure Laterality Date  . No past surgeries     Family History  Problem Relation Age of Onset  . Stroke Maternal Grandmother    Current Outpatient Prescriptions  Medication Sig Dispense Refill  . aspirin 325 MG tablet Take 1 tablet (325 mg total) by mouth daily.    Marland Kitchen. atorvastatin (LIPITOR) 10 MG tablet Take 10 mg by mouth daily.    . Calcium Carbonate-Vitamin D (CALCIUM-D) 600-400 MG-UNIT TABS Take 1 tablet by mouth daily. 60 tablet 0  . loratadine (CLARITIN) 10 MG tablet TK 1 T PO QD FOR ALLERGIES  5    . losartan (COZAAR) 50 MG tablet Take 50 mg by mouth daily.    . Multiple Vitamins-Minerals (MULTIVITAMIN ADULT) TABS Take 1 tablet by mouth daily.    . Omega 3 1000 MG CAPS Take 0.1 capsules (100 mg total) by mouth daily. 90 each 1  . baclofen (LIORESAL) 10 MG tablet Take 1 tablet (10 mg total) by mouth 3 (three) times daily. 30 each 0   No current facility-administered medications for this visit.   No Known Allergies Social History   Social History  . Marital Status: Married    Spouse Name: N/A  . Number of Children: N/A  . Years of Education: N/A   Occupational History  . Not on file.   Social History Main Topics  . Smoking status: Never Smoker   . Smokeless tobacco: Not on file  . Alcohol Use: No  . Drug Use: No  . Sexual Activity: Not on file   Other Topics Concern  . Not on file   Social History Narrative    Review of Systems Full 14 system review of systems performed and notable only for those listed, all others are neg:  Constitutional:  Weight gain Cardiovascular:  Ear/Nose/Throat:  Ringing in ears Skin:  Eyes:   Respiratory:   Gastroitestinal:   Genitourinary:  Hematology/Lymphatic:   Endocrine: increased thirst Musculoskeletal:  Joint pain Allergy/Immunology:   Neurological:  Memory loss, difficulty swallowing Psychiatric: anxiety, change in appetite Sleep:    Physical Exam  Filed Vitals:   11/16/15 1024  BP: 134/94  Pulse: 70    General - Well nourished, well developed, in no apparent distress.  Ophthalmologic - Sharp disc margins OU.  Cardiovascular - Regular rate and rhythm with no murmur. Carotid pulses were 2+ without bruits .   Neck - supple, no nuchal rigidity .  Mental Status -  Level of arousal and orientation to time, place, and person were intact. Able to follow commands, but limited speech outpt with dysarthria and paraphasic errors, naming 3/5 and not able to repeat.  Cranial Nerves II - XII - II - Visual field intact  OU. III, IV, VI - Extraocular movements intact. V - right decreased sensation, 30% of left. VII - right facial droop. VIII - Hearing & vestibular intact bilaterally. X - Palate elevates symmetrically. XI - Chin turning & shoulder shrug intact bilaterally. XII - Tongue protrusion intact.  Motor Strength - The patient's strength was RUE 5-/5 deltoid, 4/5 tricep and 4-/5 bicep and 3-/5 hand grip, RLE 5-/5 proximal and distal with left PFO brace. LUE and LLE 5/5. Bulk was normal and fasciculations were absent.   Motor Tone - Muscle tone was assessed at the neck and appendages and was normal except right hand spastic flexion.  Reflexes - The patient's reflexes were 1+ in all extremities except RUE 3+ and he had no pathological reflexes.  Sensory - Light touch, temperature/pinprick were assessed and were normal.    Coordination - The patient had normal movements in the hands with no ataxia or dysmetria.  Tremor was absent.  Gait and Station - in wheelchair, not tested.   Imaging  I have personally reviewed the radiological images below and agree with the radiology interpretations.  none  Lab Review none    Assessment and Plan:   In summary, Mario [name] is a 40 y.o. male with PMH of HTN had sudden onset right sided hemiplegia and aphasia. EMS called and pt admitted to [facility] on 10/18/14 and was discharged to [facility] CIR on 10/21/14. He finished CIR and then discharged to outpt PT/OT. He continued following with Dr. 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 Right side weakness much improved but still has significant aphasia. No MRI or stroke labs available at this time. Will repeat stroke work up. Continue ASA and lipitor. Will add baclofen for muscle spasm.    - continue ASA and lipitor for stroke prevention - will prescribe baclofen  tid for muscle spasm  - will continue stroke work up with MRI and MRA, CUS, TTE, TCD bubble and DVT.  -  will check hypercoagulable and autoimmune work up - Follow up with your primary care physician for stroke risk factor modification. Recommend maintain blood pressure goal <130/80, diabetes with hemoglobin A1c goal below 6.5% and lipids with LDL cholesterol goal below 70 mg/dL.  - follow up with Dr. Wynn Banker for rehab - check BP at home and record - continue outpt therapy - follow up in 2 months.  I recommend aggressive blood pressure control with a goal <130/80 mm Hg.  Lipids should be managed intensively, with a goal LDL < 70 mg/dL.  I encouraged the patient to discuss these important issues with his primary care physician.  I counseled the patient on measures to reduce stroke risk, including the importance of medication compliance, risk factor control, exercise, healthy diet, and avoidance  of smoking.  I reviewed stroke warning signs and symptoms and appropriate actions to take if such occurs.   Thank you very much for the opportunity to participate in the care of this patient.  Please do not hesitate to call if any questions or concerns arise.  Orders Placed This Encounter  Procedures  . [place] TCD WITHMONITORING    Standing Status: Future     Number of Occurrences:      Standing Expiration Date: 05/18/2016    Order Specific Question:  Reason for Exam (SYMPTOM  OR DIAGNOSIS REQUIRED)    Answer:  embolic stroke    Order Specific Question:  Preferred imaging location?    Answer:  Internal  . MR Brain Wo Contrast    Standing Status: Future     Number of Occurrences:      Standing Expiration Date: 01/17/2017    Order Specific Question:  Reason for Exam (SYMPTOM  OR DIAGNOSIS REQUIRED)    Answer:  embolic stroke    Order Specific Question:  Preferred imaging location?    Answer:  Internal    Order Specific Question:  Does the patient have a pacemaker or implanted devices?    Answer:  No    Order Specific Question:  What is the patient's sedation requirement?    Answer:  No Sedation  . MR  MRA HEAD WO CONTRAST    Standing Status: Future     Number of Occurrences:      Standing Expiration Date: 01/17/2017    Order Specific Question:  Reason for Exam (SYMPTOM  OR DIAGNOSIS REQUIRED)    Answer:  embolic stroke    Order Specific Question:  Preferred imaging location?    Answer:  Internal    Order Specific Question:  Does the patient have a pacemaker or implanted devices?    Answer:  No    Order Specific Question:  What is the patient's sedation requirement?    Answer:  No Sedation  . Systemic Lupus Profile A  . Hypercoagulable panel, comprehensive  . Lipid panel  . Hemoglobin A1c  . TSH + free T4  . Vitamin B12  . RPR  . HIV antibody  . Folate  . Sedimentation rate  . C-reactive protein  . CBC (no diff)  . CMP  . ECHOCARDIOGRAM COMPLETE    Standing Status: Future     Number of Occurrences:      Standing Expiration Date: 02/14/2017    Order Specific Question:  Where should this test be performed    Answer:  [facility])    Order Specific Question:  Does the patient weigh less than or greater than 250 lbs?    Answer:  Patient weighs less than 250 lbs    Order Specific Question:  Complete or Limited study?    Answer:  Complete    Order Specific Question:  With Image Enhancing Agent or without Image Enhancing Agent?    Answer:  With Image Enhancing Agent    Order Specific Question:  Reason for exam-Echo    Answer:  Stroke  434.91 / I163.9    Meds ordered this encounter  Medications  . baclofen (LIORESAL) 10 MG tablet    Sig: Take 1 tablet (10 mg total) by mouth 3 (three) times daily.    Dispense:  30 each    Refill:  0    Patient Instructions  - continue ASA and lipitor for stroke prevention - will prescribe baclofen for muscle spasm  -  will continue stroke work up with MRI, ultrasound of heart and brain, and to rule out leg clots.  - will have blood draw today for stroke labs - Follow up with your primary care physician for stroke  risk factor modification. Recommend maintain blood pressure goal <130/80, diabetes with hemoglobin A1c goal below 6.5% and lipids with LDL cholesterol goal below 70 mg/dL.  - follow up with Dr. Wynn Banker for rehab - check BP at home and record - continue outpt therapy - follow up in 2 months.    Marvel Plan, MD PhD [facility] [address] [place], [place] [address] [phone]

## 2015-11-16 NOTE — Patient Instructions (Signed)
-   continue ASA and lipitor for stroke prevention - will prescribe baclofen for muscle spasm  - will continue stroke work up with MRI, ultrasound of heart and brain, and to rule out leg clots.  - will have blood draw today for stroke labs - Follow up with your primary care physician for stroke risk factor modification. Recommend maintain blood pressure goal <130/80, diabetes with hemoglobin A1c goal below 6.5% and lipids with LDL cholesterol goal below 70 mg/dL.  - follow up with Dr. Wynn BankerKirsteins for rehab - check BP at home and record - continue outpt therapy - follow up in 2 months.

## 2015-11-23 ENCOUNTER — Other Ambulatory Visit: Payer: Self-pay | Admitting: Neurology

## 2015-11-26 ENCOUNTER — Encounter: Payer: BC Managed Care – PPO | Attending: Physical Medicine & Rehabilitation

## 2015-11-26 ENCOUNTER — Ambulatory Visit (HOSPITAL_BASED_OUTPATIENT_CLINIC_OR_DEPARTMENT_OTHER): Payer: BC Managed Care – PPO | Admitting: Physical Medicine & Rehabilitation

## 2015-11-26 ENCOUNTER — Encounter: Payer: Self-pay | Admitting: Physical Medicine & Rehabilitation

## 2015-11-26 VITALS — BP 121/86 | HR 69

## 2015-11-26 DIAGNOSIS — G8111 Spastic hemiplegia affecting right dominant side: Secondary | ICD-10-CM | POA: Diagnosis not present

## 2015-11-26 DIAGNOSIS — I10 Essential (primary) hypertension: Secondary | ICD-10-CM | POA: Insufficient documentation

## 2015-11-26 DIAGNOSIS — IMO0002 Reserved for concepts with insufficient information to code with codable children: Secondary | ICD-10-CM

## 2015-11-26 DIAGNOSIS — I69320 Aphasia following cerebral infarction: Secondary | ICD-10-CM | POA: Diagnosis not present

## 2015-11-26 DIAGNOSIS — I63512 Cerebral infarction due to unspecified occlusion or stenosis of left middle cerebral artery: Secondary | ICD-10-CM | POA: Diagnosis not present

## 2015-11-26 DIAGNOSIS — Z8673 Personal history of transient ischemic attack (TIA), and cerebral infarction without residual deficits: Secondary | ICD-10-CM | POA: Diagnosis not present

## 2015-11-26 NOTE — Progress Notes (Signed)
Subjective:    Patient ID: Mellody DrownJerome A Staheli, male    DOB: 07/18/1975, 40 y.o.   MRN: [id number] 10/15/15 Botox Injection for spasticity using needle EMG guidance  Dilution: 50 Units/ml Indication: Severe spasticity which interferes with ADL,mobility and/or  hygiene and is unresponsive to medication management and other conservative care Informed consent was obtained after describing risks and benefits of the procedure with the patient. This includes bleeding, bruising, infection, excessive weakness, or medication side effects. A REMS form is on file and signed. Needle: 25g 50mm needle electrode and 30g  25mm Number of units per muscle 100 units Right Biceps 100 units Right Pectoralis 25 units FDS 25 units FDP 25 units FCU 25 units F CR 25 units lumbricals divided between 3 sites 75 units in tibialis posterior All injections were done after obtaining appropriate EMG activity and after negative drawback for blood. The patient tolerated the procedure well. Post procedure instructions were given. A followup appointment was made.  Pectoralis had some mild to moderate increased in spontaneous motor unit action potential firing. 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  Remains aphasic but is able to communicate with yes no's. Accompanied by his sister who drove him today Pain Inventory Average Pain 10 Pain Right Now 10 My pain is tingling  In the last 24 hours, has pain interfered with the following? General activity 10 Relation with others 8 Enjoyment of life 10 What TIME of day is your pain at its worst? all times Sleep (in general) Poor  Pain is worse with: ? Pain improves with: ? Relief from Meds: No pain meds  Mobility walk without assistance how many minutes can you walk? a  while ability to climb steps?  yes transfers alone  Function not employed: date last employed .  Neuro/Psych tingling spasms dizziness confusion depression  Prior Studies .  Physicians involved in your care .   Family History  Problem Relation Age of Onset  . Stroke Maternal Grandmother    Social History   Social History  . Marital Status: Married    Spouse Name: N/A  . Number of Children: N/A  . Years of Education: N/A   Social History Main Topics  . Smoking status: Never Smoker   . Smokeless tobacco: None  . Alcohol Use: No  . Drug Use: No  . Sexual Activity: Not Asked   Other Topics Concern  . None   Social History Narrative   Past Surgical History  Procedure Laterality Date  . No past surgeries     Past Medical History  Diagnosis Date  . Hypertension   . Stroke (HCC)    BP 121/86 mmHg  Pulse 69  SpO2 98%  Opioid Risk Score:   Fall Risk Score:  `1  Depression screen PHQ 2/9  Depression screen PHQ 2/9 04/13/2015  Decreased Interest 0  Down, Depressed, Hopeless 1  PHQ - 2 Score 1  Altered sleeping 0  Tired, decreased energy 0  Change in appetite 1  Feeling bad or failure about yourself  1  Trouble concentrating 1  Moving slowly or fidgety/restless 1  Suicidal thoughts 0  PHQ-9 Score 5     Review of Systems  Constitutional: Negative.   HENT: Negative.   Eyes: Negative.   Respiratory: Negative.   Cardiovascular: Negative.   Endocrine: Negative.  Genitourinary: Negative.   Musculoskeletal: Negative.   Skin: Negative.   Allergic/Immunologic: Negative.   Neurological: Positive for dizziness and numbness.  Psychiatric/Behavioral: Positive for confusion.       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 Would recommend right tibial nerve block with phenol.  If this is not helpful in keeping the foot in a plantigrade position, would recommend new posterior semisolid AFO custom molded plastic  2. 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 The Botox was not particularly helpful for the tibialis posterior muscle and he can use additional Botox in the right FDP and right FDS, Otherwise same dosing in the upper extremities.

## 2015-11-26 NOTE — Patient Instructions (Signed)
We will do an injection for the right tibial nerve to control toe flexion as well as the foot turning over and pointing downward. The medicine we uses phenol which is kind of like alcohol and it blocks the nerve for 3-6 months

## 2015-11-29 ENCOUNTER — Telehealth: Payer: Self-pay | Admitting: Neurology

## 2015-11-29 ENCOUNTER — Telehealth: Payer: Self-pay

## 2015-11-29 NOTE — Telephone Encounter (Signed)
Copy of patients labs concerning cholesterol abnormal [facility] Fax confirm at [phone].

## 2015-11-29 NOTE — Telephone Encounter (Signed)
Pt's mother called to give correct phone number- [phone]

## 2015-11-29 NOTE — Telephone Encounter (Signed)
Rn call patients sister to give her lab work for her brother. Rn stated pts lab work was normal except for his cholesterol was abnormal. Rn stated patient needs to increase his lipitor to 20mg . Rn stated a copy fax of the labs and recommendations [facility] Pt will tell her brother and call the office. Pt is mildly aphasia.

## 2015-11-29 NOTE — Telephone Encounter (Signed)
Pt's mother called wanting to know when ultra sound is to be scheduled. Operator explained she is not on DPR and could not give her any information. Operator advised the message would be sent to scheduler and they would be in contact with Pia Mauina Black (sister [place]) at [phone]

## 2015-11-30 LAB — HYPERCOAGULABLE PANEL, COMPREHENSIVE
ACT. PRT C RESIST W/FV DEFIC.: 3 ratio
APTT: 25.6 s
AT III Act/Nor PPP Chro: 109 %
Anticardiolipin Ab, IgG: <10 [GPL'U]
Beta-2 Glycoprotein I, IgG: <10 SGU
Beta-2 Glycoprotein I, IgM: <10 SMU
DRVVT Screen Seconds: 36.6 s
Factor VII Antigen**: 151 %
Factor VIII Activity: 150 %
HEXAGONAL PHOSPHOLIPID NEUTRAL: 5 s
Homocysteine: 13.6 umol/L
PROTEIN S AG/FVII AG RATIO: 0.6 ratio
Prot C Ag Act/Nor PPP Imm: 115 %
Prot S Ag Act/Nor PPP Imm: 90 %
Protein C Ag/FVII Ag Ratio**: 0.8 ratio

## 2015-11-30 LAB — COMPREHENSIVE METABOLIC PANEL
A/G RATIO: 1.3 (ref 1.2–2.2)
ALT: 31 IU/L (ref 0–44)
AST: 36 IU/L (ref 0–40)
Albumin: 4.4 g/dL (ref 3.5–5.5)
Alkaline Phosphatase: 49 IU/L (ref 39–117)
BILIRUBIN TOTAL: 0.5 mg/dL (ref 0.0–1.2)
BUN/Creatinine Ratio: 11 (ref 9–20)
BUN: 14 mg/dL (ref 6–24)
CHLORIDE: 100 mmol/L (ref 96–106)
CO2: 26 mmol/L (ref 18–29)
Calcium: 10 mg/dL (ref 8.7–10.2)
Creatinine, Ser: 1.27 mg/dL (ref 0.76–1.27)
GFR calc non Af Amer: 70 mL/min/{1.73_m2} (ref >59)
GFR, EST AFRICAN AMERICAN: 81 mL/min/{1.73_m2} (ref >59)
Globulin, Total: 3.3 g/dL (ref 1.5–4.5)
Glucose: 82 mg/dL (ref 65–99)
POTASSIUM: 4.1 mmol/L (ref 3.5–5.2)
Sodium: 143 mmol/L (ref 134–144)
TOTAL PROTEIN: 7.7 g/dL (ref 6.0–8.5)

## 2015-11-30 LAB — CBC
Hematocrit: 45.3 % (ref 37.5–51.0)
Hemoglobin: 15.3 g/dL (ref 12.6–17.7)
MCH: 31.7 pg (ref 26.6–33.0)
MCHC: 33.8 g/dL (ref 31.5–35.7)
MCV: 94 fL (ref 79–97)
PLATELETS: 189 10*3/uL (ref 150–379)
RBC: 4.83 x10E6/uL (ref 4.14–5.80)
RDW: 14.8 % (ref 12.3–15.4)
WBC: 4.9 10*3/uL (ref 3.4–10.8)

## 2015-11-30 LAB — FOLATE: FOLATE: 15.4 ng/mL (ref >3.0)

## 2015-11-30 LAB — SYSTEMIC LUPUS PROFILE A
ENA RNP Ab: 0.3 AI (ref 0.0–0.9)
ENA SM Ab Ser-aCnc: <0.2 AI (ref 0.0–0.9)
ENA SSB (LA) AB: 0.4 AI (ref 0.0–0.9)

## 2015-11-30 LAB — LIPID PANEL
Chol/HDL Ratio: 4.4 ratio units (ref 0.0–5.0)
Cholesterol, Total: 249 mg/dL — ABNORMAL HIGH (ref 100–199)
HDL: 56 mg/dL (ref >39)
LDL Calculated: 166 mg/dL — ABNORMAL HIGH (ref 0–99)
TRIGLYCERIDES: 134 mg/dL (ref 0–149)
VLDL Cholesterol Cal: 27 mg/dL (ref 5–40)

## 2015-11-30 LAB — RPR: RPR: NONREACTIVE

## 2015-11-30 LAB — SEDIMENTATION RATE: Sed Rate: 2 mm/hr (ref 0–15)

## 2015-11-30 LAB — TSH+FREE T4
FREE T4: 1.11 ng/dL (ref 0.82–1.77)
TSH: 1.02 u[IU]/mL (ref 0.450–4.500)

## 2015-11-30 LAB — HEMOGLOBIN A1C
Est. average glucose Bld gHb Est-mCnc: 103 mg/dL
Hgb A1c MFr Bld: 5.2 % (ref 4.8–5.6)

## 2015-11-30 LAB — HIV ANTIBODY (ROUTINE TESTING W REFLEX): HIV SCREEN 4TH GENERATION: NONREACTIVE

## 2015-11-30 LAB — C-REACTIVE PROTEIN: CRP: 1.2 mg/L (ref 0.0–4.9)

## 2015-11-30 LAB — VITAMIN B12: VITAMIN B 12: 399 pg/mL (ref 211–946)

## 2015-12-01 NOTE — Telephone Encounter (Signed)
Mario [name] is scheduling these now I will forward to her.

## 2015-12-09 ENCOUNTER — Ambulatory Visit (HOSPITAL_COMMUNITY): Payer: BC Managed Care – PPO

## 2015-12-09 ENCOUNTER — Telehealth: Payer: Self-pay | Admitting: *Deleted

## 2015-12-09 NOTE — Telephone Encounter (Signed)
Order faxed over for JAS brace. Have not received it back.  I called and explained that Dr. Wynn BankerKirsteins was on vacation and would be back Monday to sign the orders and we will fax it ASAP

## 2015-12-22 ENCOUNTER — Telehealth: Payer: Self-pay | Admitting: Physical Medicine & Rehabilitation

## 2015-12-22 NOTE — Telephone Encounter (Signed)
Mario [name] OT with [facility] is seeing patient, and he needs a new AFO brace.  The one that he has is hurting his knee.  Patient does have an appointment with Dr. Wynn BankerKirsteins this Friday and wanted to see if he could evaluate him for a new brace.

## 2015-12-23 ENCOUNTER — Other Ambulatory Visit: Payer: Self-pay

## 2015-12-23 ENCOUNTER — Telehealth: Payer: Self-pay

## 2015-12-23 MED ORDER — BACLOFEN 10 MG PO TABS: ORAL_TABLET | ORAL | Status: DC

## 2015-12-23 NOTE — Telephone Encounter (Signed)
Please have patient bring in old brace for appointment

## 2015-12-23 NOTE — Telephone Encounter (Signed)
Left voicemail message reminding Mr Mario [name] of his appt with Dr Wynn BankerKirsteins on Friday and to please bring his brace to the appointment.

## 2015-12-23 NOTE — Telephone Encounter (Signed)
RN call patients sister about the refill on baclofen. Rn also stated pt needs to r/s the MRI or brain/neck orbit. Rn gave patients sister the number for MRI scheduling.

## 2015-12-24 ENCOUNTER — Other Ambulatory Visit: Payer: BC Managed Care – PPO

## 2015-12-24 ENCOUNTER — Ambulatory Visit (HOSPITAL_BASED_OUTPATIENT_CLINIC_OR_DEPARTMENT_OTHER): Payer: BC Managed Care – PPO | Admitting: Physical Medicine & Rehabilitation

## 2015-12-24 ENCOUNTER — Encounter: Payer: BC Managed Care – PPO | Attending: Physical Medicine & Rehabilitation

## 2015-12-24 ENCOUNTER — Encounter: Payer: Self-pay | Admitting: Physical Medicine & Rehabilitation

## 2015-12-24 VITALS — BP 133/96 | HR 73 | Resp 17

## 2015-12-24 DIAGNOSIS — I10 Essential (primary) hypertension: Secondary | ICD-10-CM | POA: Insufficient documentation

## 2015-12-24 DIAGNOSIS — I69320 Aphasia following cerebral infarction: Secondary | ICD-10-CM | POA: Insufficient documentation

## 2015-12-24 DIAGNOSIS — G8111 Spastic hemiplegia affecting right dominant side: Secondary | ICD-10-CM | POA: Diagnosis not present

## 2015-12-24 DIAGNOSIS — Z8673 Personal history of transient ischemic attack (TIA), and cerebral infarction without residual deficits: Secondary | ICD-10-CM | POA: Diagnosis not present

## 2015-12-24 NOTE — Progress Notes (Signed)
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  Informed consent was obtained after describing the risks and benefits of the procedure with the patient this includes bleeding bruising and infection as well as medication side effects. The patient elected to proceed and has given written consent. Patient placed in a prone position on the exam table. External DC stimulation was applied to the popliteal space using a nerve stimulator. Plantar flexion twitch was obtained. The popliteal region was prepped with Betadine and then entered with a 22-gauge 40 mm needle electrode under electrical stimulation guidance. Plantar flexion which was obtained and confirmed. Then 4 cc of 5% phenol were injected. The patient tolerated procedure well. Post procedure instructions and followup visit were given. 

## 2015-12-24 NOTE — Patient Instructions (Signed)
Tibial nerve block with phenol today. This medication may start taking the fact today however full effect will be at about one week Duration of the effect is 3-6 months Side effects of medication may include right heel numbness or burning. Call if you have burning pain so we can recommend any medication for that. 

## 2015-12-31 ENCOUNTER — Other Ambulatory Visit (HOSPITAL_COMMUNITY): Payer: Self-pay

## 2015-12-31 ENCOUNTER — Ambulatory Visit (HOSPITAL_COMMUNITY): Payer: BC Managed Care – PPO | Attending: Internal Medicine

## 2015-12-31 DIAGNOSIS — I10 Essential (primary) hypertension: Secondary | ICD-10-CM | POA: Insufficient documentation

## 2015-12-31 DIAGNOSIS — I340 Nonrheumatic mitral (valve) insufficiency: Secondary | ICD-10-CM | POA: Diagnosis not present

## 2015-12-31 DIAGNOSIS — I63512 Cerebral infarction due to unspecified occlusion or stenosis of left middle cerebral artery: Secondary | ICD-10-CM

## 2015-12-31 DIAGNOSIS — I639 Cerebral infarction, unspecified: Secondary | ICD-10-CM | POA: Diagnosis present

## 2016-01-04 ENCOUNTER — Ambulatory Visit
Admission: RE | Admit: 2016-01-04 | Discharge: 2016-01-04 | Disposition: A | Discharge status: Home / Self Care | Payer: BC Managed Care – PPO | Source: Ambulatory Visit | Attending: Neurology | Admitting: Neurology

## 2016-01-04 ENCOUNTER — Telehealth: Payer: Self-pay | Admitting: *Deleted

## 2016-01-04 DIAGNOSIS — I63512 Cerebral infarction due to unspecified occlusion or stenosis of left middle cerebral artery: Secondary | ICD-10-CM

## 2016-01-04 NOTE — Telephone Encounter (Signed)
Sister Pia Mau called back regarding earlier phone call. States patient requested she call, he didn't understand everything that was said earlier today. Skyped nurse Ambrose Pancoast who took the call.

## 2016-01-04 NOTE — Telephone Encounter (Signed)
Received call back from sister, and patient gave verbal permission to repeat previous message from Dr Roda Shutters. This RN did , and sister verbalized understanding, appreciation.

## 2016-01-04 NOTE — Telephone Encounter (Signed)
Per Dr Roda Shutters, spoke with patient and informed him his recent ultra sound/echocardiogram of his heart was unremarkable. Dr Roda Shutters will continue with his current treatment plan. Confirmed thathe is having MRI this afternoon, advised he will get call later with those results. He verbalized understanding, appreciation.

## 2016-01-07 ENCOUNTER — Other Ambulatory Visit: Payer: Self-pay | Admitting: Neurology

## 2016-01-07 DIAGNOSIS — I63512 Cerebral infarction due to unspecified occlusion or stenosis of left middle cerebral artery: Secondary | ICD-10-CM

## 2016-01-10 ENCOUNTER — Telehealth: Payer: Self-pay | Admitting: Neurology

## 2016-01-10 NOTE — Telephone Encounter (Signed)
Talked with sister Inetta Fermoina over the phone. Told her that I called patient last Saturday and delivered MRI result and reasoning for further workup with a CTA. I also explained to her the reason of further workup. She expressed understanding. Patient has appointment with me on 01/25/2016, and I hope the CTA neck will be done before the appointment so that we can go over the result.  Marvel PlanJindong Vanna Sailer, MD PhD Stroke Neurology 01/10/2016 5:39 PM

## 2016-01-10 NOTE — Telephone Encounter (Signed)
Sister Inetta Fermoina called, states someone from our office called and spoke with brother on Friday to advise, scan of neck showed blood clot in throat. Please call.

## 2016-01-10 NOTE — Telephone Encounter (Signed)
I spoke to Mario [name] (pt[facility] She reports that pt called her on Friday to tell her that he spoke to Dr. Roda ShuttersXu. Pt's sister cannot understand him because of the speaking difficulties he has from the stroke. She is asking if Dr. Roda ShuttersXu could please call her and relay what he discussed with the pt so she can understand. She wants to know what Dr. Roda ShuttersXu recommends from here; is it a sooner appt or to see another doctor? Pt's sister Mario [name] can be reached at [phone].  Per Dr. Warren DanesXu's result note, "MRI and MRA report and images reviewed. 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 However, his cerebral angio last year after the stroke did not show significant proximal ICA stenosis. Will need to do CTA neck for further evaluation. Also will need TCD VMR for risk stratification. I called pt and delivered MRI results and also informed him about the further work up. He is in agreement. "

## 2016-01-25 ENCOUNTER — Telehealth: Payer: Self-pay | Admitting: Neurology

## 2016-01-25 ENCOUNTER — Encounter: Payer: Self-pay | Admitting: Neurology

## 2016-01-25 ENCOUNTER — Ambulatory Visit (INDEPENDENT_AMBULATORY_CARE_PROVIDER_SITE_OTHER): Payer: BC Managed Care – PPO | Admitting: Neurology

## 2016-01-25 VITALS — BP 127/88 | HR 79 | Ht 72.0 in | Wt 213.5 lb

## 2016-01-25 DIAGNOSIS — I63512 Cerebral infarction due to unspecified occlusion or stenosis of left middle cerebral artery: Secondary | ICD-10-CM

## 2016-01-25 DIAGNOSIS — G8111 Spastic hemiplegia affecting right dominant side: Secondary | ICD-10-CM

## 2016-01-25 DIAGNOSIS — R482 Apraxia: Secondary | ICD-10-CM

## 2016-01-25 DIAGNOSIS — E785 Hyperlipidemia, unspecified: Secondary | ICD-10-CM

## 2016-01-25 DIAGNOSIS — I69390 Apraxia following cerebral infarction: Secondary | ICD-10-CM

## 2016-01-25 MED ORDER — GABAPENTIN 100 MG PO CAPS: ORAL_CAPSULE | ORAL | 3 refills | Status: DC

## 2016-01-25 NOTE — Telephone Encounter (Signed)
Patient ready to be scheduled for Doppler. NO PA needed. Thanks Dana.  °

## 2016-01-25 NOTE — Patient Instructions (Addendum)
-   continue ASA and lipitor for stroke prevention - continue baclofen for muscle spasm  - will continue stroke work up with CTA neck, TCD bubble and TCD emboli detection.  - Follow up with your primary care physician for stroke risk factor modification. Recommend maintain blood pressure goal <130/80, diabetes with hemoglobin A1c goal below 6.5% and lipids with LDL cholesterol goal below 70 mg/dL.  - follow up with Dr. Wynn BankerKirsteins for rehab and botox - will try gabapentin for your tingling and buzzing sensation.  - check BP at home and record - continue outpt therapy and home exercise - follow up in 4 months.

## 2016-01-26 NOTE — Progress Notes (Signed)
NEUROLOGY CLINIC FOLLOW UP NOTE  NAME: RASHEE MARSCHALL DOB: 1975-07-29  History summary: GEREMY RISTER is a 40 y.o. male with PMH of HTN who follows up for a stroke.   He was driving to work on 1/61/09 at 4pm and had sudden onset right sided pain and then developed aphasia, not able to talk over the phone. He arrived at work and started working. Later, he went to bathroom for urination but passed out there. He was found at 4am with right sided hemiplegia and aphasia. EMS called and pt sent to [facility]. Due to outside of intervention window, he was kept in [facility] until discharged to [facility] CIR on 10/21/14. He finished CIR and then discharged to outpt PT/OT. He continued following with Dr. 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 His right side weakness much improved but still has significant expressive aphasia. Currently he complains of HA, bilateral ear ringing. He still has outpt PT and speech. He was referred to neurology for stroke follow up.   Interval history: During the interval time, pt has been doing the same. Still has expressive aphasia and right hemiparesis. Had several stroke work up including MRI, MRA, TTE and hypercoagulable work up. MRI showed the chronic left MCA infarct. MRA showed left M1 cut off and small caliber of left ICA suggestive proximal stenosis. Will do CTA neck. TTE and DVT negative. hypercoagulabe test also negative. He is getting botox injection for right spastic hemiparesis. Still has right sided tingling and burning sensation. Also complain of pain at right neck near the jaw on palpation and swallow. He has outpt PT/OT and also doing home exercise. BP today 127/88.    Past Medical History:  Diagnosis Date  . Hypertension   . Stroke [facility])    Past Surgical History:  Procedure Laterality Date  . NO PAST SURGERIES     Family History  Problem Relation Age of Onset  . Stroke Maternal Grandmother    Current  Outpatient Prescriptions  Medication Sig Dispense Refill  . aspirin 325 MG tablet Take 1 tablet (325 mg total) by mouth daily.    Marland Kitchen atorvastatin (LIPITOR) 10 MG tablet Take 10 mg by mouth daily.    . baclofen (LIORESAL) 10 MG tablet TAKE 1 TABLET(10 MG) BY MOUTH THREE TIMES DAILY 180 tablet 2  . Calcium Carbonate-Vitamin D (CALCIUM-D) 600-400 MG-UNIT TABS Take 1 tablet by mouth daily. 60 tablet 0  . loratadine (CLARITIN) 10 MG tablet TK 1 T PO QD FOR ALLERGIES  5  . losartan (COZAAR) 50 MG tablet Take 50 mg by mouth daily.    . Multiple Vitamins-Minerals (MULTIVITAMIN ADULT) TABS Take 1 tablet by mouth daily.    . Omega 3 1000 MG CAPS Take 0.1 capsules (100 mg total) by mouth daily. 90 each 1  . sertraline (ZOLOFT) 50 MG tablet Take 50 mg by mouth daily.    Marland Kitchen gabapentin (NEURONTIN) 100 MG capsule Take one capsule nightly for 7 days and then twice a day for 7 days and then 3 times a day. 90 capsule 3   No current facility-administered medications for this visit.    No Known Allergies Social History   Social History  . Marital status: Married    Spouse name: N/A  . Number of children: N/A  . Years of education: N/A   Occupational History  . Not on file.   Social History Main Topics  . Smoking status: Never Smoker  . Smokeless  tobacco: Never Used  . Alcohol use No  . Drug use: No  . Sexual activity: Not on file   Other Topics Concern  . Not on file   Social History Narrative  . No narrative on file    Review of Systems Full 14 system review of systems performed and notable only for those listed, all others are neg:  Constitutional:  Weight gain Cardiovascular: palpitation Ear/Nose/Throat:  Hering loss, trouble swallowing, drooling Skin:  Eyes:   Respiratory:   Gastroitestinal:  constipation Genitourinary:  Hematology/Lymphatic:   Endocrine:  Musculoskeletal:  J Allergy/Immunology:   Neurological:  Memory loss, speech difficulty, dizziness, numbness Psychiatric:    Sleep:    Physical Exam  Vitals:   01/25/16 1343  BP: 127/88  Pulse: 79    General - Well nourished, well developed, in no apparent distress.  Ophthalmologic - Sharp disc margins OU.  Cardiovascular - Regular rate and rhythm with no murmur. Carotid pulses were 2+ without bruits .   Neck - supple, no nuchal rigidity .  Mental Status -  Level of arousal and orientation to time, place, and person were intact. Able to follow commands, but limited speech outpt with dysarthria and paraphasic errors, naming 3/5 and not able to repeat.  Cranial Nerves II - XII - II - Visual field intact OU. III, IV, VI - Extraocular movements intact. V - right decreased sensation, 30% of left. VII - right facial droop. VIII - Hearing & vestibular intact bilaterally. X - Palate elevates symmetrically. XI - Chin turning & shoulder shrug intact bilaterally. XII - Tongue protrusion intact.  Motor Strength - The patient's strength was RUE 5-/5 deltoid, 4/5 tricep and 4-/5 bicep and 3-/5 hand grip, RLE 5-/5 proximal and distal with left PFO brace. LUE and LLE 5/5. Bulk was normal and fasciculations were absent.   Motor Tone - Muscle tone was assessed at the neck and appendages and was normal except right hand spastic flexion.  Reflexes - The patient's reflexes were 1+ in all extremities except RUE 3+ and he had no pathological reflexes.  Sensory - Light touch, temperature/pinprick were assessed and were normal.    Coordination - The patient had normal movements in the hands with no ataxia or dysmetria.  Tremor was absent.  Gait and Station - in wheelchair, not tested.   Imaging  I have personally reviewed the radiological images below and agree with the radiology interpretations.  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 No other significant stenosis or aneurysm noted. Correlate with carotid ultrasound or  CT angiogram of the neck to look for high-grade proximal stenosis on the left   Mr Brain Wo Contrast 01/05/2016 Abnormal MRI scan of the brain showing remote age left middle cerebral artery infarct with areas of encephalomalacia, gliosis and laminar necrosis with compensatory dilatation of the left lateral ventricle. No acute abnormalities noted.  TTE - - Left ventricle: Distal posterior hypokinesis The cavity size was   normal. Wall thickness was normal. Systolic function was normal.   The estimated ejection fraction was in the range of 55% to 60%.   Doppler parameters are consistent with abnormal left ventricular   relaxation (grade 1 diastolic dysfunction). - Mitral valve: There was mild regurgitation.  Lab Review Component     Latest  Ref Rng & Units 11/16/2015  Hcys SerPl-sCnc     umol/L 13.6  Factor VIII Activity     % 150  AT III Act/Nor PPP Chro     % 109  Prot C Ag Act/Nor PPP Imm     % 115  Prot S Ag Act/Nor PPP Imm     % 90  Factor VII Antigen**     % 151  Protein C Ag/FVII Ag Ratio**     ratio 0.8  Protein S Ag/FVII Ag Ratio**     ratio 0.6  Act. Prt C Resist w/FV Defic.     ratio 3.0  APTT     sec 25.6  APTT 1:1 NP     sec CANCELED  APTT 1:1 Saline     Sec CANCELED  LAC Interpretation      Comment  DRVVT Screen Seconds     sec 36.6  DRVVT Confirm Seconds     sec CANCELED  DRVVT Ratio     ratio CANCELED  Hexagonal Phospholipid Neutral     sec 5  Anticardiolipin Ab, IgG     GPL <10  Anticardiolipin Ab, IgM     MPL <10  Beta-2 Glycoprotein I, IgG     SGU <10  Beta-2 Glycoprotein I, IgM     SMU <10  Beta-2 Glycoprotein I, IgA     SAU <10  Pathologist Interpretation      CANCELED  Factor II Gene Mutation Result      Comment  Interpretation      Comment  Methodology      Comment  Comments      Comment  ENA RNP Ab      0.0 - 0.9 AI 0.3  ENA SM Ab Ser-aCnc     0.0 - 0.9 AI <0.2  RA Latex Turbid.     0.0 - 13.9 IU/mL <10.0  Chromatin Ab SerPl-aCnc     0.0 - 0.9 AI <0.2  ENA SSA (RO) Ab     0.0 - 0.9 AI <0.2  ENA SSB (LA) Ab     0.0 - 0.9 AI 0.4  dsDNA Ab     0 - 9 IU/mL <1  Cholesterol, Total     100 - 199 mg/dL 161249 (H)  Triglycerides     0 - 149 mg/dL 096134  HDL Cholesterol     >39 mg/dL 56  VLDL Cholesterol Cal     5 - 40 mg/dL 27  LDL (calc)     0 - 99 mg/dL 045166 (H)  Total CHOL/HDL Ratio     0.0 - 5.0 ratio units 4.4  Hemoglobin A1C     4.8 - 5.6 % 5.2  Est. average glucose Bld gHb Est-mCnc     mg/dL 409103  TSH     8.1190.450 - 1.4784.500 uIU/mL 1.020  T4,Free(Direct)     0.82 - 1.77 ng/dL 2.951.11  Vitamin A21B12     308211 - 946 pg/mL 399  RPR     Non Reactive Non Reactive  HIV     Non Reactive Non Reactive  Folate     >3.0 ng/mL 15.4  Sed Rate     0 - 15 mm/hr 2  CRP     0.0 - 4.9 mg/L 1.2      Assessment and Plan:   In summary, Mellody DrownJerome A Rexroad is a 40 y.o. male with PMH of HTN had sudden onset right sided hemiplegia and aphasia. EMS called and [facility] and [facility]. He finished CIR and then discharged to outpt PT/OT. He continued following with Dr. 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 Right side weakness much improved but still has significant aphasia. MRI showed the chronic left MCA infarct. MRA showed left M1 cut off and small caliber of left ICA suggestive proximal stenosis. Will do CTA neck to rule out dissection. TTE and hypercoagulabe tests all negative. Still has right sided tingling and burning sensation. Also complain of pain at right neck near the jaw on palpation and swallow. He has outpt PT/OT and also doing home exercise. Continue ASA and lipitor. on baclofen for muscle spasm.   Plan: - continue ASA and lipitor for stroke prevention - continue baclofen for muscle spasm  - will  continue stroke work up with CTA neck, TCD bubble and TCD emboli detection.  - Follow up with your primary care physician for stroke risk factor modification. Recommend maintain blood pressure goal <130/80, diabetes with hemoglobin A1c goal below 6.5% and lipids with LDL cholesterol goal below 70 mg/dL.  - follow up with Dr. Wynn BankerKirsteins for rehab and botox injection - will try gabapentin for right sided tingling and buzzing sensation.  - check BP at home and record - continue outpt therapy and home exercise - follow up in 4 months.  I spent more than 25 minutes of face to face time with the patient. Greater than 50% of time was spent in counseling and coordination of care. We discussed further stroke work up, symptomatic management and follow up with Dr. Wynn BankerKirsteins with botox injection.    Orders Placed This Encounter  Procedures  . [facility]    Standing Status:   Future    Standing Expiration Date:   07/27/2016    Order Specific Question:   Reason for Exam (SYMPTOM  OR DIAGNOSIS REQUIRED)    Answer:   embolic stroke    Order Specific Question:   Preferred imaging location?    Answer:   Internal  . US TCD WITH BUBBLES    Standing Status:   Future    Standing Expiration Date:   07/28/2016    Order Specific Question:   Reason for Exam (SYMPTOM  OR DIAGNOSIS REQUIRED)    Answer:   embolic stroke    Order Specific Question:   Preferred imaging location?    Answer:   Internal    Meds ordered this encounter  Medications  . gabapentin (NEURONTIN) 100 MG capsule    Sig: Take one capsule nightly for 7 days and then twice a day for 7 days and then 3 times a day.    Dispense:  90 capsule    Refill:  3    Patient Instructions  - continue ASA and lipitor for stroke prevention - continue baclofen for muscle spasm  - will continue stroke work up with CTA neck, TCD bubble and TCD emboli detection.  - Follow up with your primary care physician for stroke risk factor modification.  Recommend maintain blood pressure goal <130/80, diabetes with hemoglobin A1c goal below 6.5% and lipids with LDL cholesterol goal below 70 mg/dL.  - follow up with Dr. Wynn BankerKirsteins for rehab and botox - will try gabapentin for your tingling and buzzing sensation.  - check BP at home and record - continue outpt therapy and home exercise - follow up in 4 months.   Marvel PlanJindong Ticia Virgo, MD PhD [facility] [phone] [phone]  [address] [place], [place] [address] [phone]

## 2016-02-09 ENCOUNTER — Ambulatory Visit (INDEPENDENT_AMBULATORY_CARE_PROVIDER_SITE_OTHER): Payer: BC Managed Care – PPO

## 2016-02-09 ENCOUNTER — Encounter: Payer: Self-pay | Admitting: *Deleted

## 2016-02-09 DIAGNOSIS — Z0289 Encounter for other administrative examinations: Secondary | ICD-10-CM

## 2016-02-09 DIAGNOSIS — I63512 Cerebral infarction due to unspecified occlusion or stenosis of left middle cerebral artery: Secondary | ICD-10-CM

## 2016-02-09 NOTE — Progress Notes (Signed)
Patient in office for transcranial doppler study with Dr Roda ShuttersXu. IV started per protocol, 1st attempt, [facility]. Excellent blood return. Patient tolerated well.

## 2016-02-18 ENCOUNTER — Encounter: Payer: Self-pay | Admitting: Physical Medicine & Rehabilitation

## 2016-02-18 ENCOUNTER — Ambulatory Visit (HOSPITAL_BASED_OUTPATIENT_CLINIC_OR_DEPARTMENT_OTHER): Payer: BC Managed Care – PPO | Admitting: Physical Medicine & Rehabilitation

## 2016-02-18 ENCOUNTER — Encounter: Payer: BC Managed Care – PPO | Attending: Physical Medicine & Rehabilitation

## 2016-02-18 VITALS — BP 123/89 | HR 69 | Resp 14

## 2016-02-18 DIAGNOSIS — G8111 Spastic hemiplegia affecting right dominant side: Secondary | ICD-10-CM | POA: Diagnosis not present

## 2016-02-18 DIAGNOSIS — Z8673 Personal history of transient ischemic attack (TIA), and cerebral infarction without residual deficits: Secondary | ICD-10-CM | POA: Diagnosis not present

## 2016-02-18 DIAGNOSIS — I10 Essential (primary) hypertension: Secondary | ICD-10-CM | POA: Insufficient documentation

## 2016-02-18 DIAGNOSIS — I69320 Aphasia following cerebral infarction: Secondary | ICD-10-CM | POA: Diagnosis not present

## 2016-02-18 NOTE — Patient Instructions (Signed)

## 2016-02-18 NOTE — Progress Notes (Signed)
Botox Injection for spasticity using needle EMG guidance  Dilution: 50 Units/ml Indication: Severe spasticity which interferes with ADL,mobility and/or  hygiene and is unresponsive to medication management and other conservative care Informed consent was obtained after describing risks and benefits of the procedure with the patient. This includes bleeding, bruising, infection, excessive weakness, or medication side effects. A REMS form is on file and signed. Needle: 25g 50mm needle electrode and 30g  25mm Number of units per muscle 100 units Right Biceps 125 units Right Pectoralis 50 units FDS 50 units FDP 25 units FCU 25 units F CR 25 units lumbricals divided between 3 sites  All injections were done after obtaining appropriate EMG activity and after negative drawback for blood. The patient tolerated the procedure well. Post procedure instructions were given. A followup appointment was made.  Pectoralis had some mild to moderate increased in spontaneous motor unit action potential firing.

## 2016-02-23 ENCOUNTER — Telehealth: Payer: Self-pay | Admitting: Neurology

## 2016-02-23 NOTE — Telephone Encounter (Signed)
TCD emboli monitoring negative  TCD bubble study negative.   Marvel PlanJindong Ladiamond Gallina, MD PhD Stroke Neurology 02/23/2016 7:11 AM

## 2016-03-06 ENCOUNTER — Other Ambulatory Visit: Payer: BC Managed Care – PPO

## 2016-03-31 ENCOUNTER — Encounter: Payer: Self-pay | Admitting: Physical Medicine & Rehabilitation

## 2016-03-31 ENCOUNTER — Ambulatory Visit (HOSPITAL_BASED_OUTPATIENT_CLINIC_OR_DEPARTMENT_OTHER): Payer: BC Managed Care – PPO | Admitting: Physical Medicine & Rehabilitation

## 2016-03-31 ENCOUNTER — Encounter: Payer: BC Managed Care – PPO | Attending: Physical Medicine & Rehabilitation

## 2016-03-31 VITALS — BP 123/84 | HR 76 | Resp 14

## 2016-03-31 DIAGNOSIS — R269 Unspecified abnormalities of gait and mobility: Secondary | ICD-10-CM

## 2016-03-31 DIAGNOSIS — G8111 Spastic hemiplegia affecting right dominant side: Secondary | ICD-10-CM | POA: Insufficient documentation

## 2016-03-31 DIAGNOSIS — I69320 Aphasia following cerebral infarction: Secondary | ICD-10-CM | POA: Insufficient documentation

## 2016-03-31 DIAGNOSIS — I10 Essential (primary) hypertension: Secondary | ICD-10-CM | POA: Diagnosis not present

## 2016-03-31 DIAGNOSIS — Z8673 Personal history of transient ischemic attack (TIA), and cerebral infarction without residual deficits: Secondary | ICD-10-CM | POA: Insufficient documentation

## 2016-03-31 DIAGNOSIS — I69398 Other sequelae of cerebral infarction: Secondary | ICD-10-CM | POA: Diagnosis not present

## 2016-03-31 NOTE — Progress Notes (Signed)
Subjective:    Patient ID: Mario [name], male    DOB: July 21, 1975, 40 y.o.   MRN: [id number]  HPI Botox, perform 02/18/2016 to the following muscle groups 100 units Right Biceps 125 units Right Pectoralis 50 units FDS 50 units FDP 25 units FCU 25 units F CR 25 units lumbricals divided between 3 sites  Patient has had good relaxation of spasticity without excessive weakness.  Having some problem with his right AFO, Velcro straps are wearing out.  Remains independent with self-care and mobility Independent with driving  Pain Inventory Average Pain 0 Pain Right Now 0 My pain is no pain  In the last 24 hours, has pain interfered with the following? General activity 0 Relation with others 0 Enjoyment of life 0 What TIME of day is your pain at its worst? no pain Sleep (in general) Fair  Pain is worse with: no pain Pain improves with: no pain Relief from Meds: no pain  Mobility walk without assistance  Function disabled: date disabled .  Neuro/Psych No problems in this area  Prior Studies Any changes since last visit?  no  Physicians involved in your care Any changes since last visit?  no   Family History  Problem Relation Age of Onset  . Stroke Maternal Grandmother    Social History   Social History  . Marital status: Married    Spouse name: N/A  . Number of children: N/A  . Years of education: N/A   Social History Main Topics  . Smoking status: Never Smoker  . Smokeless tobacco: Never Used  . Alcohol use No  . Drug use: No  . Sexual activity: Not Asked   Other Topics Concern  . None   Social History Narrative  . None   Past Surgical History:  Procedure Laterality Date  . NO PAST SURGERIES     Past Medical History:  Diagnosis Date  . Hypertension   . Stroke (HCC)    BP 123/84 (BP Location: Left Arm, Patient Position: Sitting, Cuff Size: Large)   Pulse 76   Resp 14   SpO2 95%   Opioid Risk Score:   Fall Risk Score:   `1  Depression screen PHQ 2/9  Depression screen PHQ 2/9 04/13/2015  Decreased Interest 0  Down, Depressed, Hopeless 1  PHQ - 2 Score 1  Altered sleeping 0  Tired, decreased energy 0  Change in appetite 1  Feeling bad or failure about yourself  1  Trouble concentrating 1  Moving slowly or fidgety/restless 1  Suicidal thoughts 0  PHQ-9 Score 5    Review of Systems  Constitutional: Negative.   HENT: Negative.   Eyes: Negative.   Respiratory: Negative.   Cardiovascular: Negative.   Endocrine: Negative.   Genitourinary: Negative.   Musculoskeletal: Negative.   Skin: Negative.   Allergic/Immunologic: Negative.   Hematological: Negative.   Psychiatric/Behavioral: Negative.        Objective:   Physical Exam  Motor strength is 3 minus in the right bowel deltoid, biceps, triceps, grip, Tone Ashworth grade 1 spasticity in the biceps, 1 at the pectoralis, 2 at the lumbricals 1 at the wrist flexors  Lower extremity strength is 4 at the hip flexors, knee extensors on the right side, trace ankle dorsiflexor 2 minus, ankle plantar flexor.  Ambulates with AFO. His AFO has Velcro straps that are worn out.       Assessment & Plan:  1. Left MCA infarct with residual aphasia, as well as right  spastic hemiplegia. He is doing quite well functionally. Botox dose is appropriate. Will repeat in 6 weeks. 2. Orthotic management. He does have Velcro straps that require replacement on the right AFO. Will give a prescription to have this done at [facility]

## 2016-03-31 NOTE — Patient Instructions (Signed)
Next visit will be for Botox  You'll need to drive to  [facility] [address] [place] [id number] [phone]

## 2016-05-18 ENCOUNTER — Ambulatory Visit (HOSPITAL_BASED_OUTPATIENT_CLINIC_OR_DEPARTMENT_OTHER): Payer: BC Managed Care – PPO | Admitting: Physical Medicine & Rehabilitation

## 2016-05-18 ENCOUNTER — Encounter: Payer: Self-pay | Admitting: Physical Medicine & Rehabilitation

## 2016-05-18 ENCOUNTER — Encounter: Payer: BC Managed Care – PPO | Attending: Physical Medicine & Rehabilitation

## 2016-05-18 VITALS — BP 143/91 | HR 65

## 2016-05-18 DIAGNOSIS — I69320 Aphasia following cerebral infarction: Secondary | ICD-10-CM | POA: Insufficient documentation

## 2016-05-18 DIAGNOSIS — G8111 Spastic hemiplegia affecting right dominant side: Secondary | ICD-10-CM

## 2016-05-18 DIAGNOSIS — Z8673 Personal history of transient ischemic attack (TIA), and cerebral infarction without residual deficits: Secondary | ICD-10-CM | POA: Diagnosis not present

## 2016-05-18 DIAGNOSIS — I10 Essential (primary) hypertension: Secondary | ICD-10-CM | POA: Insufficient documentation

## 2016-05-18 MED ORDER — GABAPENTIN 100 MG PO CAPS: 100.0000 mg | ORAL_CAPSULE | Freq: Three times a day (TID) | ORAL | 1 refills | Status: DC

## 2016-05-18 NOTE — Patient Instructions (Signed)

## 2016-05-18 NOTE — Progress Notes (Signed)
   Botox Injection for spasticity using needle EMG guidance  Dilution: 50 Units/ml Indication: Severe spasticity which interferes with ADL,mobility and/or  hygiene and is unresponsive to medication management and other conservative care Informed consent was obtained after describing risks and benefits of the procedure with the patient. This includes bleeding, bruising, infection, excessive weakness, or medication side effects. A REMS form is on file and signed. Needle: 25g 2" needle electrode Number of units per muscle Pectoralis125 Biceps100 FCR25 FCU25 FDS50 FDP50 FPL0 Lumbricals 25U All injections were done after obtaining appropriate EMG activity and after negative drawback for blood. The patient tolerated the procedure well. Post procedure instructions were given. A followup appointment was made.

## 2016-05-19 ENCOUNTER — Ambulatory Visit: Payer: BC Managed Care – PPO | Admitting: Physical Medicine & Rehabilitation

## 2016-06-12 ENCOUNTER — Telehealth: Payer: Self-pay | Admitting: Neurology

## 2016-06-12 ENCOUNTER — Ambulatory Visit (INDEPENDENT_AMBULATORY_CARE_PROVIDER_SITE_OTHER): Payer: BC Managed Care – PPO | Admitting: Neurology

## 2016-06-12 ENCOUNTER — Encounter: Payer: Self-pay | Admitting: Neurology

## 2016-06-12 VITALS — BP 136/87 | HR 85 | Ht 72.0 in | Wt 226.4 lb

## 2016-06-12 DIAGNOSIS — I639 Cerebral infarction, unspecified: Secondary | ICD-10-CM

## 2016-06-12 DIAGNOSIS — G8191 Hemiplegia, unspecified affecting right dominant side: Secondary | ICD-10-CM

## 2016-06-12 DIAGNOSIS — I63512 Cerebral infarction due to unspecified occlusion or stenosis of left middle cerebral artery: Secondary | ICD-10-CM | POA: Diagnosis not present

## 2016-06-12 DIAGNOSIS — G8111 Spastic hemiplegia affecting right dominant side: Secondary | ICD-10-CM

## 2016-06-12 DIAGNOSIS — E785 Hyperlipidemia, unspecified: Secondary | ICD-10-CM

## 2016-06-12 DIAGNOSIS — I10 Essential (primary) hypertension: Secondary | ICD-10-CM | POA: Diagnosis not present

## 2016-06-12 DIAGNOSIS — R4701 Aphasia: Secondary | ICD-10-CM

## 2016-06-12 DIAGNOSIS — IMO0002 Reserved for concepts with insufficient information to code with codable children: Secondary | ICD-10-CM

## 2016-06-12 MED ORDER — GABAPENTIN 300 MG PO CAPS: ORAL_CAPSULE | ORAL | 5 refills | Status: DC

## 2016-06-12 NOTE — Progress Notes (Signed)
NEUROLOGY CLINIC FOLLOW UP NOTE  NAME: Mario [name] DOB: 02-Jul-1975  History summary: Mario [name] is a 41 y.o. male with PMH of HTN who follows up for a stroke.   He was driving to work on 1/61/09 at 4pm and had sudden onset right sided pain and then developed aphasia, not able to talk over the phone. He arrived at work and started working. Later, he went to bathroom for urination but passed out there. He was found at 4am with right sided hemiplegia and aphasia. EMS called and pt sent to [facility]. Due to outside of intervention window, he was kept in [facility] until discharged to [facility] CIR on 10/21/14. He finished CIR and then discharged to outpt PT/OT. He continued following with Dr. 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 His right side weakness much improved but still has significant expressive aphasia. Currently he complains of HA, bilateral ear ringing. He still has outpt PT and speech. He was referred to neurology for stroke follow up.   01/25/16 follow up - pt has been doing the same. Still has expressive aphasia and right hemiparesis. Had several stroke work up including MRI, MRA, TTE and hypercoagulable work up. MRI showed the chronic left MCA infarct. MRA showed left M1 cut off and small caliber of left ICA suggestive proximal stenosis. Will do CTA neck. TTE and DVT negative. hypercoagulabe test also negative. He is getting botox injection for right spastic hemiparesis. Still has right sided tingling and burning sensation. Also complain of pain at right neck near the jaw on palpation and swallow. He has outpt PT/OT and also doing home exercise. BP today 127/88.   Interval history: During the interval time, patient has been doing the same. Still has expressive aphasia and right hemiparesis. Follow with Dr.Kirsteins, getting Botox injection for right spasticity. Had a TCD emboli detection and bubble study. both negative. Still has  speech therapy and OT, but no more PT. Still has right-sided numbness/buzzing sensation, low dose Neurontin did not help, and it was stopped by himself. BP today 136/87   Past Medical History:  Diagnosis Date  . Hypertension   . Stroke [facility])    Past Surgical History:  Procedure Laterality Date  . NO PAST SURGERIES     Family History  Problem Relation Age of Onset  . Stroke Maternal Grandmother    Current Outpatient Prescriptions  Medication Sig Dispense Refill  . aspirin 325 MG tablet Take 1 tablet (325 mg total) by mouth daily.    Marland Kitchen atorvastatin (LIPITOR) 10 MG tablet Take 10 mg by mouth daily.    . Calcium Carbonate-Vitamin D (CALCIUM-D) 600-400 MG-UNIT TABS Take 1 tablet by mouth daily. 60 tablet 0  . loratadine (CLARITIN) 10 MG tablet TK 1 T PO QD FOR ALLERGIES  5  . losartan (COZAAR) 50 MG tablet Take 50 mg by mouth daily.    . Multiple Vitamins-Minerals (MULTIVITAMIN ADULT) TABS Take 1 tablet by mouth daily.    . Omega 3 1000 MG CAPS Take 0.1 capsules (100 mg total) by mouth daily. 90 each 1  . gabapentin (NEURONTIN) 300 MG capsule Take one cap nightly for 7 days and then bid for 7 days and then tid. 90 capsule 5   No current facility-administered medications for this visit.    No Known Allergies Social History   Social History  . Marital status: Married    Spouse name: N/A  . Number of children: N/A  .  Years of education: N/A   Occupational History  . Not on file.   Social History Main Topics  . Smoking status: Never Smoker  . Smokeless tobacco: Never Used  . Alcohol use No  . Drug use: No  . Sexual activity: Not on file   Other Topics Concern  . Not on file   Social History Narrative  . No narrative on file    Review of Systems Full 14 system review of systems performed and notable only for those listed, all others are neg:  Constitutional:   Cardiovascular: palpitation Ear/Nose/Throat:  Hering loss, Ear pain, ringing in ears, drooling, ear  discharge Skin:  Eyes:   Respiratory:   Gastroitestinal:   Genitourinary:  Hematology/Lymphatic:   Endocrine:  Musculoskeletal:  J Allergy/Immunology:   Neurological:  Memory loss, speech difficulty, dizziness, numbness, headache, weakness, tremor Psychiatric:  Sleep:    Physical Exam  Vitals:   06/12/16 1351  BP: 136/87  Pulse: 85    General - Well nourished, well developed, in no apparent distress.  Ophthalmologic - Sharp disc margins OU.  Cardiovascular - Regular rate and rhythm with no murmur. Carotid pulses were 2+ without bruits .   Neck - supple, no nuchal rigidity .  Mental Status -  Level of arousal and orientation to time, place, and person were intact. Able to follow commands, but Expressive aphasia, naming 3/5 and not able to repeat.  Cranial Nerves II - XII - II - Visual field intact OU. III, IV, VI - Extraocular movements intact. V - right decreased sensation. VII - right facial droop. VIII - Hearing & vestibular intact bilaterally. X - Palate elevates symmetrically. XI - Chin turning & shoulder shrug intact bilaterally. XII - Tongue protrusion intact.  Motor Strength - The patient's strength was RUE 5-/5 deltoid, 4/5 tricep and 4-/5 bicep and 3-/5 hand grip, RLE 5-/5 proximal and distal with left PFO brace. LUE and LLE 5/5. Bulk was normal and fasciculations were absent.   Motor Tone - Muscle tone was assessed at the neck and appendages and was normal except right hand spastic flexion.  Reflexes - The patient's reflexes were 1+ in all extremities except RUE 3+ and he had no pathological reflexes.  Sensory - Light touch, temperature/pinprick were assessed and were normal.    Coordination - The patient had normal movements in the hands with no ataxia or dysmetria.  Tremor was absent.  Gait and Station - right hemiparetic gait.   Imaging  I have personally reviewed the radiological images below and agree with the radiology interpretations.  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 No other significant stenosis or aneurysm noted. Correlate with carotid ultrasound or  CT angiogram of the neck to look for high-grade proximal stenosis on the left   Mr Brain Wo Contrast 01/05/2016 Abnormal MRI scan of the brain showing remote age left middle cerebral artery infarct with areas of encephalomalacia, gliosis and laminar necrosis with compensatory dilatation of the left lateral ventricle. No acute abnormalities noted.  TTE - - Left ventricle: Distal posterior hypokinesis The cavity size was   normal. Wall thickness was normal. Systolic function was normal.   The estimated ejection fraction was in the range of 55% to 60%.   Doppler parameters are consistent with abnormal left  ventricular   relaxation (grade 1 diastolic dysfunction). - Mitral valve: There was mild regurgitation.  TCD bubble study - negative for PFO  TCD emboli detection - negative   Lab Review Component     Latest Ref Rng & Units 11/16/2015  Hcys SerPl-sCnc     umol/L 13.6  Factor VIII Activity     % 150  AT III Act/Nor PPP Chro     % 109  Prot C Ag Act/Nor PPP Imm     % 115  Prot S Ag Act/Nor PPP Imm     % 90  Factor VII Antigen**     % 151  Protein C Ag/FVII Ag Ratio**     ratio 0.8  Protein S Ag/FVII Ag Ratio**     ratio 0.6  Act. Prt C Resist w/FV Defic.     ratio 3.0  APTT     sec 25.6  APTT 1:1 NP     sec CANCELED  APTT 1:1 Saline     Sec CANCELED  LAC Interpretation      Comment  DRVVT Screen Seconds     sec 36.6  DRVVT Confirm Seconds     sec CANCELED  DRVVT Ratio     ratio CANCELED  Hexagonal Phospholipid Neutral     sec 5  Anticardiolipin Ab, IgG     GPL <10  Anticardiolipin Ab, IgM     MPL <10  Beta-2 Glycoprotein I,  IgG     SGU <10  Beta-2 Glycoprotein I, IgM     SMU <10  Beta-2 Glycoprotein I, IgA     SAU <10  Pathologist Interpretation      CANCELED  Factor II Gene Mutation Result      Comment  Interpretation      Comment  Methodology      Comment  Comments      Comment  ENA RNP Ab     0.0 - 0.9 AI 0.3  ENA SM Ab Ser-aCnc     0.0 - 0.9 AI <0.2  RA Latex Turbid.     0.0 - 13.9 IU/mL <10.0  Chromatin Ab SerPl-aCnc     0.0 - 0.9 AI <0.2  ENA SSA (RO) Ab     0.0 - 0.9 AI <0.2  ENA SSB (LA) Ab     0.0 - 0.9 AI 0.4  dsDNA Ab     0 - 9 IU/mL <1  Cholesterol, Total     100 - 199 mg/dL 161249 (H)  Triglycerides     0 - 149 mg/dL 096134  HDL Cholesterol     >39 mg/dL 56  VLDL Cholesterol Cal     5 - 40 mg/dL 27  LDL (calc)     0 - 99 mg/dL 045166 (H)  Total CHOL/HDL Ratio     0.0 - 5.0 ratio units 4.4  Hemoglobin A1C     4.8 - 5.6 % 5.2  Est. average glucose Bld gHb Est-mCnc     mg/dL 409103  TSH     8.1190.450 - 1.4784.500 uIU/mL 1.020  T4,Free(Direct)     0.82 - 1.77 ng/dL 2.951.11  Vitamin A21B12     308211 - 946 pg/mL 399  RPR     Non Reactive Non Reactive  HIV     Non Reactive Non Reactive  Folate     >3.0 ng/mL 15.4  Sed Rate     0 - 15 mm/hr 2  CRP     0.0 - 4.9 mg/L  1.2      Assessment and Plan:   In summary, Mario [name] is a 41 y.o. male with PMH of HTN had sudden onset right sided hemiplegia and aphasia. EMS called and pt admitted to [facility] on 10/18/14 and was discharged to [facility] CIR on 10/21/14. He finished CIR and then discharged to outpt PT/OT. He continued following with Dr. 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 Right side weakness much improved but still has significant aphasia. MRI showed the chronic left MCA infarct. MRA showed left M1 cut off and small caliber of left ICA suggestive proximal stenosis. However, initial cerebral angio in 10/2014 did not show any dissection. TTE and hypercoagulabe tests all negative. Still has right  sided tingling and burning sensation, put on low dose neurontin but not effective. He agrees to try higher dose neurontin. He has outpt PT/OT and also doing home exercise. Continue ASA and lipitor.    Plan: - continue ASA and lipitor for stroke prevention - will try neurontin again with higher dose. Will do 300mg  at night for 7 days and then twice a day for 7 days and then three times a day. - Follow up with your primary care physician for stroke risk factor modification. Recommend maintain blood pressure goal <130/80, diabetes with hemoglobin A1c goal below 6.5% and lipids with LDL cholesterol goal below 70 mg/dL.  - follow up with Dr. Wynn Banker for rehab and botox injection. - check BP at home and record - continue OT/speech therapy and home exercise - follow up in 6 months.  I spent more than 25 minutes of face to face time with the patient. Greater than 50% of time was spent in counseling and coordination of care. We discussed prognosis, symptomatic management and follow up with Dr. Wynn Banker with botox injection.    No orders of the defined types were placed in this encounter.   Meds ordered this encounter  Medications  . DISCONTD: atorvastatin (LIPITOR) 40 MG tablet  . DISCONTD: losartan-hydrochlorothiazide (HYZAAR) 100-12.5 MG tablet  . gabapentin (NEURONTIN) 300 MG capsule    Sig: Take one cap nightly for 7 days and then bid for 7 days and then tid.    Dispense:  90 capsule    Refill:  5    Patient Instructions  - continue ASA and lipitor for stroke prevention - will try neurontin again with higher dose. Will do 300mg  at night for 7 days and then twice a day for 7 days and then three times a day. - Follow up with your primary care physician for stroke risk factor modification. Recommend maintain blood pressure goal <130/80, diabetes with hemoglobin A1c goal below 6.5% and lipids with LDL cholesterol goal below 70 mg/dL.  - follow up with Dr. Wynn Banker for rehab and botox  injection. - check BP at home and record - continue OT therapy and home exercise - follow up in 6 months.   Marvel Plan, MD PhD [facility] [address] [place], [place] [address] [phone]

## 2016-06-12 NOTE — Telephone Encounter (Signed)
Pt's father called (he is not on DPR) said the pt is very upset  that he has was advised today he would not get any better. He would like to speak with Dr Roda ShuttersXu. He was advised he is not on DPR and Dr Roda ShuttersXu could not talk with him. He asked if Dr Roda ShuttersXu would call the pt's sister Pia Mauina Black [phone] to get a better understanding of the situation.

## 2016-06-12 NOTE — Telephone Encounter (Signed)
Discussed with pt sister Inetta Fermoina over the phone. Let her know what happened in the clinic today and asked her to talk with pt about his prognosis. I have to be honest with pt and tell him the prognosis in the clinic. Pt did not accept the prognosis very well today but he needs to know that. Inetta Fermoina is going to talk with pt about that and offer counseling. She will call back if any further questions.   Marvel PlanJindong Ethelyn Cerniglia, MD PhD Stroke Neurology 06/12/2016 5:42 PM

## 2016-06-12 NOTE — Patient Instructions (Signed)
-   continue ASA and lipitor for stroke prevention - will try neurontin again with higher dose. Will do 300mg  at night for 7 days and then twice a day for 7 days and then three times a day. - Follow up with your primary care physician for stroke risk factor modification. Recommend maintain blood pressure goal <130/80, diabetes with hemoglobin A1c goal below 6.5% and lipids with LDL cholesterol goal below 70 mg/dL.  - follow up with Dr. Wynn BankerKirsteins for rehab and botox injection. - check BP at home and record - continue OT therapy and home exercise - follow up in 6 months.

## 2016-06-29 ENCOUNTER — Ambulatory Visit: Payer: BC Managed Care – PPO | Admitting: Physical Medicine & Rehabilitation

## 2016-07-07 ENCOUNTER — Encounter: Payer: Self-pay | Admitting: Physical Medicine & Rehabilitation

## 2016-07-07 ENCOUNTER — Encounter: Payer: BC Managed Care – PPO | Attending: Physical Medicine & Rehabilitation

## 2016-07-07 ENCOUNTER — Ambulatory Visit (HOSPITAL_BASED_OUTPATIENT_CLINIC_OR_DEPARTMENT_OTHER): Payer: BC Managed Care – PPO | Admitting: Physical Medicine & Rehabilitation

## 2016-07-07 VITALS — BP 127/88 | HR 68 | Resp 14

## 2016-07-07 DIAGNOSIS — I69398 Other sequelae of cerebral infarction: Secondary | ICD-10-CM

## 2016-07-07 DIAGNOSIS — I69390 Apraxia following cerebral infarction: Secondary | ICD-10-CM

## 2016-07-07 DIAGNOSIS — I639 Cerebral infarction, unspecified: Secondary | ICD-10-CM

## 2016-07-07 DIAGNOSIS — I10 Essential (primary) hypertension: Secondary | ICD-10-CM | POA: Insufficient documentation

## 2016-07-07 DIAGNOSIS — G8111 Spastic hemiplegia affecting right dominant side: Secondary | ICD-10-CM

## 2016-07-07 DIAGNOSIS — M21371 Foot drop, right foot: Secondary | ICD-10-CM | POA: Insufficient documentation

## 2016-07-07 DIAGNOSIS — I69320 Aphasia following cerebral infarction: Secondary | ICD-10-CM | POA: Insufficient documentation

## 2016-07-07 DIAGNOSIS — IMO0002 Reserved for concepts with insufficient information to code with codable children: Secondary | ICD-10-CM

## 2016-07-07 DIAGNOSIS — Z8673 Personal history of transient ischemic attack (TIA), and cerebral infarction without residual deficits: Secondary | ICD-10-CM | POA: Insufficient documentation

## 2016-07-07 DIAGNOSIS — R4701 Aphasia: Secondary | ICD-10-CM

## 2016-07-07 DIAGNOSIS — R269 Unspecified abnormalities of gait and mobility: Secondary | ICD-10-CM

## 2016-07-07 NOTE — Patient Instructions (Signed)
Please take prescription for new AFO to [facility]

## 2016-07-07 NOTE — Progress Notes (Signed)
Subjective:    Patient ID: Mario [name], male    DOB: 01/18/1976, 41 y.o.   MRN: [id number]  HPI 12/14 Pectoralis125 Biceps100 FCR25 FCU25 FDS50 FDP50 Lumbricals 25U  9/15 100 units Right Biceps 125 units Right Pectoralis 50 units FDS 50 units FDP 25 units FCU 25 units F CR 25 units lumbricals divided between 3 sites  The patient feels like the injections have been helpful. We reviewed the 2 recent injections and they had the same treatment protocol and similar efficacy. Pain Inventory Average Pain 10 Pain Right Now 10 My pain is sharp and stabbing  In the last 24 hours, has pain interfered with the following? General activity 10 Relation with others 10 Enjoyment of life 10 What TIME of day is your pain at its worst? all Sleep (in general) Poor  Pain is worse with: walking, bending, sitting, inactivity and standing Pain improves with: none Relief from Meds: 0  Mobility walk without assistance walk with assistance how many minutes can you walk? 30 ability to climb steps?  yes do you drive?  yes Do you have any goals in this area?  yes  Function not employed: date last employed . disabled: date disabled . Do you have any goals in this area?  yes  Neuro/Psych bowel control problems weakness numbness tingling trouble walking spasms dizziness confusion depression anxiety loss of taste or smell  Prior Studies Any changes since last visit?  no  Physicians involved in your care Any changes since last visit?  no   Family History  Problem Relation Age of Onset  . Stroke Maternal Grandmother    Social History   Social History  . Marital status: Married    Spouse name: N/A  . Number of children: N/A  . Years of education: N/A   Social History Main Topics  . Smoking status: Never Smoker  . Smokeless tobacco: Never Used  . Alcohol use No  . Drug use: No  . Sexual activity: Not Asked   Other Topics Concern  . None   Social History  Narrative  . None   Past Surgical History:  Procedure Laterality Date  . NO PAST SURGERIES     Past Medical History:  Diagnosis Date  . Hypertension   . Stroke (HCC)    BP 127/88   Pulse 68   Resp 14   SpO2 96%   Opioid Risk Score:   Fall Risk Score:  `1  Depression screen PHQ 2/9  Depression screen PHQ 2/9 04/13/2015  Decreased Interest 0  Down, Depressed, Hopeless 1  PHQ - 2 Score 1  Altered sleeping 0  Tired, decreased energy 0  Change in appetite 1  Feeling bad or failure about yourself  1  Trouble concentrating 1  Moving slowly or fidgety/restless 1  Suicidal thoughts 0  PHQ-9 Score 5    Review of Systems  Constitutional: Positive for appetite change and unexpected weight change.  Eyes: Negative.   Respiratory: Positive for apnea.   Cardiovascular: Positive for leg swelling.  Gastrointestinal: Positive for abdominal pain and constipation.  Endocrine: Negative.   Genitourinary: Negative.   Musculoskeletal: Positive for gait problem.       Spasms   Neurological: Positive for dizziness, weakness and numbness.       Tingling   Hematological: Negative.   Psychiatric/Behavioral: Positive for confusion and dysphoric mood. The patient is nervous/anxious.   All other systems reviewed and are negative.      Objective:   Physical  Exam Ashworth grade 2 at the right lumbricals, 1 at the biceps, 0 at the finger flexors and wrist flexors. 1. At the pectoralis. Motor strength is 4 minus at the deltoid, biceps, triceps, finger flexors and extensors. He does have decreased fine motor function in the right upper extremity. Right lower extremity has 5/5 in the hip flexor, knee extensor, but only 3 minus and ankle dorsiflexor, plantar flexor. I reviewed looked at his AFO. It is cracked at the footplate       Assessment & Plan:  1. 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 At the doses noted above. Would be able to repeat this in  6 more weeks.  referral to orthotist, he will need a new right AFO  2. Adjustment to stroke. Father took me aside and discussed that patient was very upset after neurology discussed prognosis for further improvement is poor at this point. I did indicate to the father that no further spontaneous improvement is expected. We did discuss that if the patient continues to have problems with adjustment, he may be a good candidate to see the neuropsychologist.  Over half of the 25 min visit was spent counseling and coordinating care.

## 2016-07-17 ENCOUNTER — Other Ambulatory Visit: Payer: Self-pay | Admitting: Physical Medicine & Rehabilitation

## 2016-08-17 ENCOUNTER — Encounter: Payer: Self-pay | Admitting: Physical Medicine & Rehabilitation

## 2016-08-17 ENCOUNTER — Ambulatory Visit (HOSPITAL_BASED_OUTPATIENT_CLINIC_OR_DEPARTMENT_OTHER): Payer: BC Managed Care – PPO | Admitting: Physical Medicine & Rehabilitation

## 2016-08-17 ENCOUNTER — Encounter: Payer: BC Managed Care – PPO | Attending: Physical Medicine & Rehabilitation

## 2016-08-17 VITALS — BP 123/85 | HR 81

## 2016-08-17 DIAGNOSIS — I10 Essential (primary) hypertension: Secondary | ICD-10-CM | POA: Diagnosis not present

## 2016-08-17 DIAGNOSIS — G8191 Hemiplegia, unspecified affecting right dominant side: Secondary | ICD-10-CM | POA: Diagnosis not present

## 2016-08-17 DIAGNOSIS — G8111 Spastic hemiplegia affecting right dominant side: Secondary | ICD-10-CM | POA: Diagnosis not present

## 2016-08-17 DIAGNOSIS — Z8673 Personal history of transient ischemic attack (TIA), and cerebral infarction without residual deficits: Secondary | ICD-10-CM | POA: Insufficient documentation

## 2016-08-17 DIAGNOSIS — I69320 Aphasia following cerebral infarction: Secondary | ICD-10-CM | POA: Diagnosis not present

## 2016-08-17 NOTE — Progress Notes (Signed)
Botox Injection for spasticity using needle EMG guidance  Dilution: 50 Units/ml Indication: Severe spasticity which interferes with ADL,mobility and/or  hygiene and is unresponsive to medication management and other conservative care Informed consent was obtained after describing risks and benefits of the procedure with the patient. This includes bleeding, bruising, infection, excessive weakness, or medication side effects. A REMS form is on file and signed. Needle: 25g 50mm needle electrode and 30g  25mm Number of units per muscle 75 units Right Biceps 75 units Right Pectoralis 50 units FDS 50 units FDP 25 units FCU 25 units F CR 75 units lumbricals divided between 3 sites Lost 25 units needle hub not secure All injections were done after obtaining appropriate EMG activity and after negative drawback for blood. The patient tolerated the procedure well. Post procedure instructions were given. A followup appointment was made.  Pectoralis had some mild to moderate increased in spontaneous motor unit action potential firing.

## 2016-08-17 NOTE — Patient Instructions (Signed)

## 2016-09-28 ENCOUNTER — Ambulatory Visit (HOSPITAL_BASED_OUTPATIENT_CLINIC_OR_DEPARTMENT_OTHER): Payer: BC Managed Care – PPO | Admitting: Physical Medicine & Rehabilitation

## 2016-09-28 ENCOUNTER — Encounter: Payer: Self-pay | Admitting: Physical Medicine & Rehabilitation

## 2016-09-28 ENCOUNTER — Encounter: Payer: BC Managed Care – PPO | Attending: Physical Medicine & Rehabilitation

## 2016-09-28 VITALS — BP 138/93 | HR 70

## 2016-09-28 DIAGNOSIS — G8111 Spastic hemiplegia affecting right dominant side: Secondary | ICD-10-CM

## 2016-09-28 DIAGNOSIS — I69320 Aphasia following cerebral infarction: Secondary | ICD-10-CM | POA: Diagnosis not present

## 2016-09-28 DIAGNOSIS — I10 Essential (primary) hypertension: Secondary | ICD-10-CM | POA: Diagnosis not present

## 2016-09-28 DIAGNOSIS — Z8673 Personal history of transient ischemic attack (TIA), and cerebral infarction without residual deficits: Secondary | ICD-10-CM | POA: Insufficient documentation

## 2016-09-28 NOTE — Patient Instructions (Signed)
Will put extra 25 Unit of Botox in R hand muscles

## 2016-09-28 NOTE — Progress Notes (Signed)
Subjective:    Patient ID: Mario [name], male    DOB: Nov 16, 1975, 41 y.o.   MRN: [id number]  HPI Botox inj 08/17/2016 75 units Right Biceps 75 units Right Pectoralis 50 units FDS 50 units FDP 25 units FCU 25 units F CR 75 units lumbricals divided between 3 sites Lost 25 units needle hub not secure  In quite pleased about the Botox injection. His only complaint would be that his right hand is still a little tight. No pain at injection sites No other complicating factors  Continues to receive outpatient speech therapy and is making slow progress  Pain Inventory Average Pain 0 Pain Right Now 0 My pain is na  In the last 24 hours, has pain interfered with the following? General activity 0 Relation with others 0 Enjoyment of life 0 What TIME of day is your pain at its worst? na Sleep (in general) Good  Pain is worse with: na Pain improves with: na   Mobility walk without assistance ability to climb steps?  yes do you drive?  yes  Function Do you have any goals in this area?  yes  Neuro/Psych dizziness confusion  Prior Studies Any changes since last visit?  no  Physicians involved in your care Any changes since last visit?  no   Family History  Problem Relation Age of Onset  . Stroke Maternal Grandmother    Social History   Social History  . Marital status: Married    Spouse name: N/A  . Number of children: N/A  . Years of education: N/A   Social History Main Topics  . Smoking status: Never Smoker  . Smokeless tobacco: Never Used  . Alcohol use No  . Drug use: No  . Sexual activity: Not on file   Other Topics Concern  . Not on file   Social History Narrative  . No narrative on file   Past Surgical History:  Procedure Laterality Date  . NO PAST SURGERIES     Past Medical History:  Diagnosis Date  . Hypertension   . Stroke [facility])    There were no vitals taken for this visit.  Opioid Risk Score:   Fall Risk Score:  `1  Depression  screen PHQ 2/9  Depression screen PHQ 2/9 04/13/2015  Decreased Interest 0  Down, Depressed, Hopeless 1  PHQ - 2 Score 1  Altered sleeping 0  Tired, decreased energy 0  Change in appetite 1  Feeling bad or failure about yourself  1  Trouble concentrating 1  Moving slowly or fidgety/restless 1  Suicidal thoughts 0  PHQ-9 Score 5    Review of Systems  Constitutional: Negative.   HENT: Negative.   Eyes: Negative.   Respiratory: Negative.   Cardiovascular: Negative.   Gastrointestinal: Negative.   Endocrine: Negative.   Genitourinary: Negative.   Musculoskeletal: Negative.   Skin: Negative.   Allergic/Immunologic: Negative.   Neurological: Negative.   Hematological: Negative.   Psychiatric/Behavioral: Negative.   All other systems reviewed and are negative.      Objective:   Physical Exam  Constitutional: He is oriented to person, place, and time. He appears well-developed and well-nourished.  HENT:  Head: Normocephalic and atraumatic.  Eyes: Conjunctivae and EOM are normal. Pupils are equal, round, and reactive to light.  Neurological: He is alert and oriented to person, place, and time. He has normal strength. Gait abnormal.  MAS 2 in the lumbricals, MAS 1 at the elbow flexors. MAS 0 in the wrist  flexors. MAS 2 in the pectoralis Motor strength is 4 minus in the right deltoid, bicep, tricep grip. Has difficulty with grasp and release Has persistent grasp with the second and third digits of the right hand.    Psychiatric: He has a normal mood and affect.  Nursing note and vitals reviewed.         Assessment & Plan:   1. Right spastic hemiplegia with aphasia secondary to left MCA distribution infarct Good effect from Botox, would recommend increased dose to lumbricals  75 units Right Biceps 75 units Right Pectoralis 50 units FDS 50 units FDP 25 units FCU 25 units F CR 100 units lumbricals divided between 3 sites  Inject on or after 11/17/2016

## 2016-10-19 NOTE — Telephone Encounter (Signed)
Entered in error

## 2016-11-20 ENCOUNTER — Encounter: Payer: Self-pay | Admitting: Physical Medicine & Rehabilitation

## 2016-11-20 ENCOUNTER — Ambulatory Visit (HOSPITAL_BASED_OUTPATIENT_CLINIC_OR_DEPARTMENT_OTHER): Payer: BC Managed Care – PPO | Admitting: Physical Medicine & Rehabilitation

## 2016-11-20 ENCOUNTER — Encounter: Payer: BC Managed Care – PPO | Attending: Physical Medicine & Rehabilitation

## 2016-11-20 VITALS — BP 142/97 | HR 83

## 2016-11-20 DIAGNOSIS — G8111 Spastic hemiplegia affecting right dominant side: Secondary | ICD-10-CM

## 2016-11-20 DIAGNOSIS — I10 Essential (primary) hypertension: Secondary | ICD-10-CM | POA: Insufficient documentation

## 2016-11-20 DIAGNOSIS — Z8673 Personal history of transient ischemic attack (TIA), and cerebral infarction without residual deficits: Secondary | ICD-10-CM | POA: Insufficient documentation

## 2016-11-20 DIAGNOSIS — I69320 Aphasia following cerebral infarction: Secondary | ICD-10-CM | POA: Insufficient documentation

## 2016-11-20 NOTE — Patient Instructions (Signed)

## 2016-11-20 NOTE — Progress Notes (Signed)
Botox Injection for spasticity using needle EMG guidance  Dilution: 50 Units/ml Indication: Severe spasticity which interferes with ADL,mobility and/or  hygiene and is unresponsive to medication management and other conservative care Informed consent was obtained after describing risks and benefits of the procedure with the patient. This includes bleeding, bruising, infection, excessive weakness, or medication side effects. A REMS form is on file and signed. Needle: 25g 2" for trunk and upper limb needle electrode Number of units per muscle Pectoralis75 Biceps75 FCR25 FCU25 FDS50 FDP50 Lumbricals 100  All injections were done after obtaining appropriate EMG activity and after negative drawback for blood. The patient tolerated the procedure well. Post procedure instructions were given. A followup appointment was made.

## 2016-12-25 ENCOUNTER — Ambulatory Visit: Payer: BC Managed Care – PPO | Admitting: Neurology

## 2017-01-01 ENCOUNTER — Encounter: Payer: BC Managed Care – PPO | Attending: Physical Medicine & Rehabilitation

## 2017-01-01 ENCOUNTER — Encounter: Payer: Self-pay | Admitting: Physical Medicine & Rehabilitation

## 2017-01-01 ENCOUNTER — Ambulatory Visit (HOSPITAL_BASED_OUTPATIENT_CLINIC_OR_DEPARTMENT_OTHER): Payer: BC Managed Care – PPO | Admitting: Physical Medicine & Rehabilitation

## 2017-01-01 VITALS — BP 135/87 | HR 73

## 2017-01-01 DIAGNOSIS — I10 Essential (primary) hypertension: Secondary | ICD-10-CM | POA: Insufficient documentation

## 2017-01-01 DIAGNOSIS — I69398 Other sequelae of cerebral infarction: Secondary | ICD-10-CM

## 2017-01-01 DIAGNOSIS — G8111 Spastic hemiplegia affecting right dominant side: Secondary | ICD-10-CM | POA: Diagnosis not present

## 2017-01-01 DIAGNOSIS — Z8673 Personal history of transient ischemic attack (TIA), and cerebral infarction without residual deficits: Secondary | ICD-10-CM | POA: Insufficient documentation

## 2017-01-01 DIAGNOSIS — R269 Unspecified abnormalities of gait and mobility: Secondary | ICD-10-CM

## 2017-01-01 DIAGNOSIS — I69320 Aphasia following cerebral infarction: Secondary | ICD-10-CM | POA: Insufficient documentation

## 2017-01-01 DIAGNOSIS — I69359 Hemiplegia and hemiparesis following cerebral infarction affecting unspecified side: Secondary | ICD-10-CM | POA: Diagnosis not present

## 2017-01-01 NOTE — Progress Notes (Signed)
Subjective:    Patient ID: Mario [name], male    DOB: 03-Apr-1976, 41 y.o.   MRN: [id number] BOTOX 11/20/16 Pectoralis75 Biceps75 FCR25 FCU25 FDS50 FDP50 Lumbricals 100 HPI   Unable to tolerate gabapentin, This was started by neurology. Patient states that it made him excessively groggy.  His mother is with him. The patient is severely aphasic. Receptive language skills are good. Expressive ability limited. He does well with gestures, yes/no questions.  Pain Inventory Average Pain 10 Pain Right Now 10 My pain is stabbing and right side of body  In the last 24 hours, has pain interfered with the following? General activity 10 Relation with others 10 Enjoyment of life 10 What TIME of day is your pain at its worst? all Sleep (in general) Good  Pain is worse with: some activites Pain improves with: nothing Relief from Meds: no meds cannot take the gabapentin  Mobility walk without assistance ability to climb steps?  yes do you drive?  yes  Function Do you have any goals in this area?  yes  Neuro/Psych numbness tremor tingling spasms dizziness depression  Prior Studies Any changes since last visit?  no  Physicians involved in your care Any changes since last visit?  no   Family History  Problem Relation Age of Onset  . Stroke Maternal Grandmother    Social History   Social History  . Marital status: Married    Spouse name: N/A  . Number of children: N/A  . Years of education: N/A   Social History Main Topics  . Smoking status: Never Smoker  . Smokeless tobacco: Never Used  . Alcohol use No  . Drug use: No  . Sexual activity: Not Asked   Other Topics Concern  . None   Social History Narrative  . None   Past Surgical History:  Procedure Laterality Date  . NO PAST SURGERIES     Past Medical History:  Diagnosis Date  . Hypertension   . Stroke (HCC)    BP 135/87   Pulse 73   SpO2 98%   Opioid Risk Score:   Fall Risk Score:   `1  Depression screen PHQ 2/9  [facility] 01/01/2017 04/13/2015  Decreased Interest 3 0  Down, Depressed, Hopeless 3 1  PHQ - 2 Score 6 1  Altered sleeping - 0  Tired, decreased energy - 0  Change in appetite - 1  Feeling bad or failure about yourself  - 1  Trouble concentrating - 1  Moving slowly or fidgety/restless - 1  Suicidal thoughts - 0  PHQ-9 Score - 5    Review of Systems  HENT: Negative.   Eyes: Negative.   Respiratory: Negative.   Cardiovascular: Negative.   Gastrointestinal: Negative.   Endocrine: Negative.   Genitourinary: Negative.   Musculoskeletal:       Spasms  Skin: Negative.   Allergic/Immunologic: Negative.   Neurological: Positive for dizziness, tremors, weakness and numbness.       Tingling  Hematological: Negative.   Psychiatric/Behavioral: Positive for dysphoric mood.  All other systems reviewed and are negative.      Objective:   Physical Exam  Constitutional: He appears well-developed and well-nourished.  HENT:  Head: Normocephalic and atraumatic.  Eyes: Pupils are equal, round, and reactive to light. Conjunctivae and EOM are normal.  Psychiatric: He exhibits a depressed mood.  Aphasic  Cannot assess memory due to aphasia  Nursing note and vitals reviewed.  Motor strength is 4 minus  at the right deltoid, biceps, triceps, finger flexors and extensors 5/5 in the right hip flexor, knee extensor and 3 minus at the ankle 5/5 strength in the left deltoid, biceps, triceps, grip, hip flexion, knee extension, ankle dorsiflexion. Gait is using an AFO, no assistive device needed.      Assessment & Plan:  1. 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 He's had good result with Botox injection with increased range of motion actively at the shoulder, elbow, wrist and fingers. Continue outpatient therapy.  We'll repeat Botox on or after 02/12/2017  2. Patient with depressed affect, we discussed referral to psychology. He  does not wish to pursue this at the current time. He certainly has an adjustment reaction difficult to tell secondary to aphasia. 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 Does not appear to be affecting patient functionally at the current time. If developing more vegetative symptoms, may consider empiric SSRI , his mother does not appear to be overly concerned at this time and states that she can cheer him up if he feels down.

## 2017-02-19 ENCOUNTER — Encounter: Payer: Self-pay | Admitting: Physical Medicine & Rehabilitation

## 2017-02-19 ENCOUNTER — Encounter: Payer: BC Managed Care – PPO | Attending: Physical Medicine & Rehabilitation

## 2017-02-19 ENCOUNTER — Ambulatory Visit (HOSPITAL_BASED_OUTPATIENT_CLINIC_OR_DEPARTMENT_OTHER): Payer: BC Managed Care – PPO | Admitting: Physical Medicine & Rehabilitation

## 2017-02-19 VITALS — BP 128/90 | HR 72

## 2017-02-19 DIAGNOSIS — I69320 Aphasia following cerebral infarction: Secondary | ICD-10-CM | POA: Diagnosis not present

## 2017-02-19 DIAGNOSIS — Z8673 Personal history of transient ischemic attack (TIA), and cerebral infarction without residual deficits: Secondary | ICD-10-CM | POA: Insufficient documentation

## 2017-02-19 DIAGNOSIS — G8111 Spastic hemiplegia affecting right dominant side: Secondary | ICD-10-CM | POA: Diagnosis present

## 2017-02-19 DIAGNOSIS — I10 Essential (primary) hypertension: Secondary | ICD-10-CM | POA: Insufficient documentation

## 2017-02-19 NOTE — Patient Instructions (Signed)

## 2017-02-19 NOTE — Progress Notes (Signed)
Botox Injection for spasticity using needle EMG guidance  Dilution: 50  Units/ml Indication: Severe spasticity which interferes with ADL,mobility and/or  hygiene and is unresponsive to medication management and other conservative care Informed consent was obtained after describing risks and benefits of the procedure with the patient. This includes bleeding, bruising, infection, excessive weakness, or medication side effects. A REMS form is on file and signed. Needle: 25g 2" for arm and 30g 1" fo rhand needle electrode Number of units per muscle Pectoralis100 Biceps75 FCR25 FCU25 FDS50 FDP50 Lumbricals75  All injections were done after obtaining appropriate EMG activity and after negative drawback for blood. The patient tolerated the procedure well. Post procedure instructions were given. A followup appointment was made.   Based on EMG activity, may consider  reducing pectoralis to 25

## 2017-03-09 NOTE — Therapy (Signed)
[facility] [address] [place], [place], [address] Phone: [phone]   Fax:  [phone]  Patient Details  Name: Mario [name] MRN: [id number] Date of Birth: [birth year] Referring Provider:  No ref. provider found  Encounter Date: 03/09/2017  SPEECH THERAPY DISCHARGE SUMMARY  Visits from Start of Care: 32  Current functional level related to goals / functional outcomes: Pt chart was held open in case pt returned for augmentative communication device training. Pt did not. Progress on goals was as follows:  SLP Short Term Goals - 05/18/15 1147     SLP SHORT TERM GOAL #6    Title pt will provide functional multimodal responses (except writing) in simple conversation with occasional min A    Baseline for 1 weeks or 3 more visits, for all STGs    Time 1    Period Weeks    Status Not Met    SLP SHORT TERM GOAL #7    Title pt will demo understanding of mod complex conversation with repeats allowed 90%    Time 1    Period Weeks    Status Not Met    SLP SHORT TERM GOAL #8    Title pt will write one word responses to communicate functionally, with multiple attempts allowed, achieving 75% listener comprehension    Status Achieved                   SLP Long Term Goals - 07/19/15 1712     SLP LONG TERM GOAL #1    Title pt will demo undertanding of simple/mod complex conversational questions via yes/no responses 80% accuracy    Period --  or 16 visits - for all LTGs    Status Achieved    SLP LONG TERM GOAL #2    Title pt will demo undertanding of simple conversational quesitons via yes/no responses 90% accuracy    Status Achieved    SLP LONG TERM GOAL #3    Title pt will perform rote speech tasks simultaneously with SLP with 40% accuracy (functional responses allowed)    Status Not Met    SLP LONG TERM GOAL #4    Title pt will imitate salient words/family names with 20% success    Status Achieved    SLP LONG TERM  GOAL #5    Title pt will demo use of simple alternative/augmentative communcation and/or multimodal communication with 80% success in mod complex message conveyance    Baseline 5 weeks or 7 more visits    Time 1    Period Weeks    Status Achieved    SLP LONG TERM GOAL #6    Title pt will state family names/information and other salient personal 2-3 word information with 80% success    Time 3    Period Weeks  or visits, LTGs 6 and 7    Status Deferred  due to focus on SGD goal/s 07-19-15    SLP LONG TERM GOAL #7    Title pt will access a device with functional success for simple "wh" questions in conversation 80% success    Time 8    Period Weeks  or 8 sessions, including today (07-19-15)    Status On-   Pt will now be formally d/c'd from [facility].      Remaining deficits: Assumed deficits remain.     Education / Equipment: Compensations for apraxia/aphasia, augmentative/alternative communication systems/devices.  Plan: Patient agrees to discharge.  Patient goals were partially met. Patient  is being discharged due to not returning since the last visit.  ?????    ]  Garald Balding ,[place], CCC-SLP  03/09/2017, 4:24 PM  [facility] [address] [place], [place], [address] Phone: [phone]   Fax:  [phone]

## 2017-04-02 ENCOUNTER — Ambulatory Visit: Payer: BC Managed Care – PPO | Admitting: Physical Medicine & Rehabilitation

## 2017-04-02 ENCOUNTER — Encounter: Payer: BC Managed Care – PPO | Attending: Physical Medicine & Rehabilitation

## 2017-04-02 DIAGNOSIS — G8111 Spastic hemiplegia affecting right dominant side: Secondary | ICD-10-CM | POA: Insufficient documentation

## 2017-04-02 DIAGNOSIS — I69320 Aphasia following cerebral infarction: Secondary | ICD-10-CM | POA: Insufficient documentation

## 2017-04-02 DIAGNOSIS — Z8673 Personal history of transient ischemic attack (TIA), and cerebral infarction without residual deficits: Secondary | ICD-10-CM | POA: Insufficient documentation

## 2017-04-02 DIAGNOSIS — I10 Essential (primary) hypertension: Secondary | ICD-10-CM | POA: Insufficient documentation

## 2017-04-10 DIAGNOSIS — Z23 Encounter for immunization: Secondary | ICD-10-CM | POA: Diagnosis not present

## 2017-04-19 DIAGNOSIS — I6350 Cerebral infarction due to unspecified occlusion or stenosis of unspecified cerebral artery: Secondary | ICD-10-CM | POA: Diagnosis not present

## 2017-04-19 DIAGNOSIS — I69920 Aphasia following unspecified cerebrovascular disease: Secondary | ICD-10-CM | POA: Diagnosis not present

## 2017-04-19 DIAGNOSIS — I10 Essential (primary) hypertension: Secondary | ICD-10-CM | POA: Diagnosis not present

## 2017-04-19 DIAGNOSIS — E782 Mixed hyperlipidemia: Secondary | ICD-10-CM | POA: Diagnosis not present

## 2017-05-03 ENCOUNTER — Encounter: Payer: Self-pay | Admitting: Physical Medicine & Rehabilitation

## 2017-05-03 ENCOUNTER — Encounter: Payer: Medicare Other | Attending: Physical Medicine & Rehabilitation

## 2017-05-03 ENCOUNTER — Other Ambulatory Visit: Payer: Self-pay

## 2017-05-03 ENCOUNTER — Ambulatory Visit (HOSPITAL_BASED_OUTPATIENT_CLINIC_OR_DEPARTMENT_OTHER): Payer: Medicare Other | Admitting: Physical Medicine & Rehabilitation

## 2017-05-03 VITALS — BP 115/79 | HR 73 | Resp 14

## 2017-05-03 DIAGNOSIS — I69320 Aphasia following cerebral infarction: Secondary | ICD-10-CM | POA: Insufficient documentation

## 2017-05-03 DIAGNOSIS — G8111 Spastic hemiplegia affecting right dominant side: Secondary | ICD-10-CM

## 2017-05-03 DIAGNOSIS — Z8673 Personal history of transient ischemic attack (TIA), and cerebral infarction without residual deficits: Secondary | ICD-10-CM | POA: Insufficient documentation

## 2017-05-03 DIAGNOSIS — I10 Essential (primary) hypertension: Secondary | ICD-10-CM | POA: Insufficient documentation

## 2017-05-03 NOTE — Progress Notes (Signed)
Subjective:    Patient ID: Mario [name], male    DOB: July 23, 1975, 41 y.o.   MRN: [id number]  HPI Left MCA infarct at Oct 19, 2014 with chronic aphasia and right spastic hemiplegia. No longer requiring assistive device for ambulation. Did well with Botox injection [facility] Pain Inventory Average Pain 10 Pain Right Now 10 My pain is tingling  In the last 24 hours, has pain interfered with the following? General activity 10 Relation with others 10 Enjoyment of life 10 What TIME of day is your pain at its worst? night Sleep (in general) Poor  Pain is worse with: unsure Pain improves with: nothing Relief from Meds: 0  Mobility walk without assistance  Function not employed: date last employed .  Neuro/Psych numbness confusion  Prior Studies Any changes since last visit?  no  Physicians involved in your care Any changes since last visit?  no   Family History  Problem Relation Age of Onset  . Stroke Maternal Grandmother    Social History   Socioeconomic History  . Marital status: Married    Spouse name: None  . Number of children: None  . Years of education: None  . Highest education level: None  Social Needs  . Financial resource strain: None  . Food insecurity - worry: None  . Food insecurity - inability: None  . Transportation needs - medical: None  . Transportation needs - non-medical: None  Occupational History  . None  Tobacco Use  . Smoking status: Never Smoker  . Smokeless tobacco: Never Used  Substance and Sexual Activity  . Alcohol use: No  . Drug use: No  . Sexual activity: None  Other Topics Concern  . None  Social History Narrative  . None   Past Surgical History:  Procedure Laterality Date  . NO PAST SURGERIES     Past Medical History:  Diagnosis Date  . Hypertension   . Stroke (HCC)    BP 115/79   Pulse 73   Resp 14   SpO2 96%   Opioid Risk Score:   Fall Risk  Score:  `1  Depression screen PHQ 2/9  Dep[facility] 01/01/2017 04/13/2015  Decreased Interest 3 0  Down, Depressed, Hopeless 3 1  PHQ - 2 Score 6 1  Altered sleeping - 0  Tired, decreased energy - 0  Change in appetite - 1  Feeling bad or failure about yourself  - 1  Trouble concentrating - 1  Moving slowly or fidgety/restless - 1  Suicidal thoughts - 0  PHQ-9 Score - 5    Review of Systems  Constitutional: Negative.   HENT: Negative.   Eyes: Negative.   Respiratory: Negative.   Gastrointestinal: Negative.   Endocrine: Negative.   Genitourinary: Negative.   Musculoskeletal: Positive for gait problem.  Allergic/Immunologic: Negative.   Hematological: Negative.   Psychiatric/Behavioral: Positive for confusion.  All other systems reviewed and are negative.      Objective:   Physical Exam  Constitutional: He is oriented to person, place, and time. He appears well-developed and well-nourished. No distress.  HENT:  Head: Normocephalic and atraumatic.  Eyes: Conjunctivae and EOM are normal. Pupils are equal, round, and reactive to light.  Neck: Normal range of motion.  Neurological: He is alert and oriented to person, place, and time.  Skin: He is not diaphoretic.  Psychiatric: He has a normal mood and affect.  Nursing note and vitals reviewed.  MAS  2 at the right biceps MAS 1 at the pectoralis MAS 0 at the wrist flexor MES 2/3 at the lumbricals on the right side. Ambulates without assistive device no evidence of toe drag or knee instability.  Has problems with word finding does well with yes no questions     Assessment & Plan:  1.  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

## 2017-05-03 NOTE — Patient Instructions (Signed)
Will change botox slightly

## 2017-06-01 ENCOUNTER — Ambulatory Visit: Payer: BC Managed Care – PPO | Admitting: Physical Medicine & Rehabilitation

## 2017-06-01 ENCOUNTER — Ambulatory Visit: Payer: BC Managed Care – PPO

## 2017-06-19 ENCOUNTER — Ambulatory Visit (HOSPITAL_BASED_OUTPATIENT_CLINIC_OR_DEPARTMENT_OTHER): Payer: Medicare Other | Admitting: Physical Medicine & Rehabilitation

## 2017-06-19 ENCOUNTER — Encounter: Payer: Self-pay | Admitting: Physical Medicine & Rehabilitation

## 2017-06-19 ENCOUNTER — Encounter: Payer: Medicare Other | Attending: Physical Medicine & Rehabilitation

## 2017-06-19 VITALS — BP 146/82 | HR 72

## 2017-06-19 DIAGNOSIS — I69320 Aphasia following cerebral infarction: Secondary | ICD-10-CM | POA: Insufficient documentation

## 2017-06-19 DIAGNOSIS — G8111 Spastic hemiplegia affecting right dominant side: Secondary | ICD-10-CM | POA: Diagnosis not present

## 2017-06-19 DIAGNOSIS — I10 Essential (primary) hypertension: Secondary | ICD-10-CM | POA: Insufficient documentation

## 2017-06-19 DIAGNOSIS — Z8673 Personal history of transient ischemic attack (TIA), and cerebral infarction without residual deficits: Secondary | ICD-10-CM | POA: Diagnosis not present

## 2017-06-19 NOTE — Progress Notes (Signed)
  Botox Injection for spasticity using needle EMG guidance  Dilution: 50 Units/ml Indication: Severe spasticity which interferes with ADL,mobility and/or  hygiene and is unresponsive to medication management and other conservative care Informed consent was obtained after describing risks and benefits of the procedure with the patient. This includes bleeding, bruising, infection, excessive weakness, or medication side effects. A REMS form is on file and signed. Needle: 25 2" needle electrode Number of units per muscle Pectoralis75 Biceps75 FCR25 FCU25 FDS50 FDP50 Lumbricals100 All injections were done after obtaining appropriate EMG activity and after negative drawback for blood. The patient tolerated the procedure well. Post procedure instructions were given. A followup appointment was made.  Based on EMG activity may reduce R pec to 50U Increased FCR to 50U

## 2017-06-19 NOTE — Patient Instructions (Signed)

## 2017-07-10 DIAGNOSIS — H9313 Tinnitus, bilateral: Secondary | ICD-10-CM | POA: Diagnosis not present

## 2017-07-31 ENCOUNTER — Encounter: Payer: Medicare Other | Attending: Physical Medicine & Rehabilitation

## 2017-07-31 ENCOUNTER — Ambulatory Visit (HOSPITAL_BASED_OUTPATIENT_CLINIC_OR_DEPARTMENT_OTHER): Payer: Medicare Other | Admitting: Physical Medicine & Rehabilitation

## 2017-07-31 ENCOUNTER — Encounter: Payer: Self-pay | Admitting: Physical Medicine & Rehabilitation

## 2017-07-31 VITALS — BP 131/86 | HR 75 | Resp 14

## 2017-07-31 DIAGNOSIS — M7541 Impingement syndrome of right shoulder: Secondary | ICD-10-CM | POA: Diagnosis not present

## 2017-07-31 DIAGNOSIS — G8111 Spastic hemiplegia affecting right dominant side: Secondary | ICD-10-CM | POA: Insufficient documentation

## 2017-07-31 DIAGNOSIS — I69320 Aphasia following cerebral infarction: Secondary | ICD-10-CM | POA: Insufficient documentation

## 2017-07-31 DIAGNOSIS — Z8673 Personal history of transient ischemic attack (TIA), and cerebral infarction without residual deficits: Secondary | ICD-10-CM | POA: Diagnosis not present

## 2017-07-31 DIAGNOSIS — I10 Essential (primary) hypertension: Secondary | ICD-10-CM | POA: Diagnosis not present

## 2017-07-31 NOTE — Patient Instructions (Signed)
Secondary Shoulder Impingement Syndrome Shoulder impingement syndrome is a condition that causes pain when connective tissues (tendons) surrounding the shoulder joint become pinched. These tendons are part of the group of muscles and tissues that help to stabilize the shoulder (rotator cuff). There are two types of impingement syndrome: primary and secondary. Secondary impingement syndrome occurs when movement of the shoulder joint is abnormal. This can happen if there is too much movement (laxity), too little movement (stiffness), or abnormal movement. What are the causes? This condition may be caused by:  Shoulder blade muscles that are weak or uncoordinated (scapular dyskinesis).  Glenohumeral instability. This is too much movement of the upper arm bone (humerus). This can result from: ? Having loose joints. ? An injury that happened during repeated overhead arm movements, such as throwing.  A hard, direct hit (blow) to the shoulder. This is rare.  What increases the risk? You may be more likely to develop this condition if you have injured your shoulder in the past or if you are an athlete who participates in:  Sports that involve throwing, such as baseball.  Tennis.  Swimming.  Volleyball.  What are the signs or symptoms? The main symptom of this condition is pain on the front or side of the shoulder. Pain may:  Get worse when lifting or raising the arm.  Get worse at night.  Wake you up from sleeping.  Feel sharp when the shoulder is moved, and then fade to an ache.  Other signs and symptoms may include:  Tenderness.  Stiffness.  Inability to raise the arm above shoulder level or behind the body.  Weakness.  How is this diagnosed? This condition may be diagnosed based on:  Your symptoms.  Your medical history.  A physical exam.  Imaging tests, such as: ? X-rays. ? MRI. ? Ultrasound.  How is this treated? Treatment for this condition may  include:  Resting your shoulder and avoiding all activities that cause pain or put stress on the shoulder.  Icing your shoulder.  NSAIDs to help reduce pain and swelling.  One or more injections of medicines to numb the area and reduce inflammation.  Physical therapy.  Surgery. This may be needed if nonsurgical treatments do not help. Surgery may involve stabilizing your shoulder and repairing your rotator cuff, as needed.  Follow these instructions at home: Managing pain, stiffness, and swelling  If directed, put ice on the injured area. ? Put ice in a plastic bag. ? Place a towel between your skin and the bag. ? Leave the ice on for 20 minutes, 2-3 times a day. Activity  Rest and return to your normal activities as told by your health care provider. Ask your health care provider what activities are safe for you.  Do exercises as told by your health care provider. General instructions  Do not use any tobacco products, including cigarettes, chewing tobacco, or e-cigarettes. Tobacco can delay healing. If you need help quitting, ask your health care provider.  Ask your health care provider when it is safe for you to drive.  Take over-the-counter and prescription medicines only as told by your health care provider.  Keep all follow-up visits as told by your health care provider. This is important. How is this prevented?  Give your body time to rest between periods of activity.  Maintain physical fitness, including strength in your shoulder muscles and back muscles. Contact a health care provider if:  Your symptoms have not improved after 2-3 months of treatment.    Your symptoms are getting worse. This information is not intended to replace advice given to you by your health care provider. Make sure you discuss any questions you have with your health care provider. Document Released: 05/22/2005 Document Revised: 01/27/2016 Document Reviewed: 04/24/2015 Elsevier Interactive  Patient Education  2018 [employer] Inc.  

## 2017-07-31 NOTE — Progress Notes (Signed)
Subjective:    Patient ID: Mario [name], male    DOB: [birth year], 42 y.o.   MRN: [id number]  HPI 42 year old male with left MCA distribution infarct with right hemiparesis and aphasia onset Oct 19, 2014.  He has completed both inpatient and outpatient rehabilitation. He has responded well to Botox injections for spasticity management primarily in the right upper extremity  His chief complaint today is right shoulder pain.  This pain is mainly when he lifts up his arm up to about shoulder level.  He has had no falls or trauma to that area.  No prior surgeries to that area. This pain persisted despite aggressive and prolonged PT and OT 02/19/2017 Pectoralis75 Biceps75 FCR25 FCU25 FDS50 FDP50 Lumbricals100  06/19/2017 Pectoralis75 Biceps75 FCR25 FCU25 FDS50 FDP50 Lumbricals100  Patient has had good relief with the Botox performed on 06/19/2017 he feels like he can move his right hand better  Pain Inventory Average Pain 10 Pain Right Now 7 My pain is sharp and tingling  In the last 24 hours, has pain interfered with the following? General activity 7 Relation with others 6 Enjoyment of life 6 What TIME of day is your pain at its worst? n/a Sleep (in general) Fair  Pain is worse with: unsure Pain improves with: rest Relief from Meds: 0  Mobility walk without assistance how many minutes can you walk? 60 do you drive?  yes transfers alone Do you have any goals in this area?  yes  Function disabled: date disabled .  Neuro/Psych numbness  Prior Studies Any changes since last visit?  no  Physicians involved in your care Any changes since last visit?  no   Family History  Problem Relation Age of Onset  . Stroke Maternal Grandmother    Social History   Socioeconomic History  . Marital status: Married    Spouse name: None  . Number of children: None  . Years of education: None  . Highest education level: None  Social Needs  . Financial resource  strain: None  . Food insecurity - worry: None  . Food insecurity - inability: None  . Transportation needs - medical: None  . Transportation needs - non-medical: None  Occupational History  . None  Tobacco Use  . Smoking status: Never Smoker  . Smokeless tobacco: Never Used  Substance and Sexual Activity  . Alcohol use: No  . Drug use: No  . Sexual activity: None  Other Topics Concern  . None  Social History Narrative  . None   Past Surgical History:  Procedure Laterality Date  . NO PAST SURGERIES     Past Medical History:  Diagnosis Date  . Hypertension   . Stroke (HCC)    BP 131/86   Pulse 75   Resp 14   SpO2 96%   Opioid Risk Score:   Fall Risk Score:  `1  Depression screen PHQ 2/9  [facility] 01/01/2017 04/13/2015  Decreased Interest 3 0  Down, Depressed, Hopeless 3 1  PHQ - 2 Score 6 1  Altered sleeping - 0  Tired, decreased energy - 0  Change in appetite - 1  Feeling bad or failure about yourself  - 1  Trouble concentrating - 1  Moving slowly or fidgety/restless - 1  Suicidal thoughts - 0  PHQ-9 Score - 5    Review of Systems  Constitutional: Negative.   HENT: Negative.   Respiratory: Negative.   Cardiovascular: Negative.   Gastrointestinal: Negative.   Endocrine:  Negative.   Genitourinary: Negative.   Musculoskeletal: Negative.   Allergic/Immunologic: Negative.   Neurological: Positive for numbness.  Hematological: Negative.   Psychiatric/Behavioral: Negative.   All other systems reviewed and are negative.      Objective:   Physical Exam  Right shoulder positive impingement signs at 90 degrees No evidence of subluxation  Tone Right upper extremity MAS 1 Pec MAS 1 FF MAS 1 WF MAS 1 Elbow flex MAS 1 Pec Mas 2 Lumbricals  Motor 3- Right Delt, bi, tri, grip, HF, KE ADF  Gait without assistive device  Aphasia expressive Difficulty with naming numbers.  Pt at word level response      Assessment & Plan:  1.   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  Aphasia - plateaued with his recovery  3.  Right shoulder impingment syndrome- subacromial injection today  Shoulder injection Subacromial   Indication:Right Shoulder pain not relieved by medication management and other conservative care.  Informed consent was obtained after describing risks and benefits of the procedure with the patient, this includes bleeding, bruising, infection and medication side effects. The patient wishes to proceed and has given written consent. Patient was placed in a seated position. The Right shoulder was marked and prepped with betadine in the subacromial area. A 25-gauge 1-1/2 inch needle was inserted into the subacromial area. After negative draw back for blood, a solution containing 1 mL of 6 mg per ML betamethasone and 4 mL of 1% lidocaine was injected. A band aid was applied. The patient tolerated the procedure well. Post procedure instructions were given.

## 2017-08-06 ENCOUNTER — Telehealth: Payer: Self-pay

## 2017-08-06 NOTE — Telephone Encounter (Signed)
Amy Coneg OT [facility] called requesting verbal orders for Eval and Treatment for his Shoulder impingement and to see him for 2xwk X 8wks.   Called her back and approved verbal orders and informed her to send us what ever needs to be signed.

## 2017-08-29 DIAGNOSIS — M7541 Impingement syndrome of right shoulder: Secondary | ICD-10-CM | POA: Diagnosis not present

## 2017-08-31 DIAGNOSIS — M7541 Impingement syndrome of right shoulder: Secondary | ICD-10-CM | POA: Diagnosis not present

## 2017-09-03 DIAGNOSIS — M7541 Impingement syndrome of right shoulder: Secondary | ICD-10-CM | POA: Diagnosis not present

## 2017-09-05 DIAGNOSIS — M7541 Impingement syndrome of right shoulder: Secondary | ICD-10-CM | POA: Diagnosis not present

## 2017-09-11 ENCOUNTER — Ambulatory Visit (HOSPITAL_BASED_OUTPATIENT_CLINIC_OR_DEPARTMENT_OTHER): Payer: Medicare Other | Admitting: Physical Medicine & Rehabilitation

## 2017-09-11 ENCOUNTER — Encounter: Payer: Self-pay | Admitting: Physical Medicine & Rehabilitation

## 2017-09-11 ENCOUNTER — Encounter: Payer: Medicare Other | Attending: Physical Medicine & Rehabilitation

## 2017-09-11 VITALS — BP 131/92 | HR 80 | Resp 14 | Ht 71.0 in | Wt 218.0 lb

## 2017-09-11 DIAGNOSIS — G8111 Spastic hemiplegia affecting right dominant side: Secondary | ICD-10-CM | POA: Insufficient documentation

## 2017-09-11 DIAGNOSIS — Z8673 Personal history of transient ischemic attack (TIA), and cerebral infarction without residual deficits: Secondary | ICD-10-CM | POA: Diagnosis not present

## 2017-09-11 DIAGNOSIS — I69320 Aphasia following cerebral infarction: Secondary | ICD-10-CM | POA: Diagnosis not present

## 2017-09-11 DIAGNOSIS — I10 Essential (primary) hypertension: Secondary | ICD-10-CM | POA: Diagnosis not present

## 2017-09-11 NOTE — Progress Notes (Signed)
Botox Injection for spasticity using needle EMG guidance  Dilution: 50 Units/ml Indication: Severe spasticity which interferes with ADL,mobility and/or  hygiene and is unresponsive to medication management and other conservative care Informed consent was obtained after describing risks and benefits of the procedure with the patient. This includes bleeding, bruising, infection, excessive weakness, or medication side effects. A REMS form is on file and signed. Needle: 27g 1" needle electrode Number of units per muscle Pectoralis75 Biceps75 FCR25 FCU25 FDS50 E FDP50 Lumbricals100  All injections were done after obtaining appropriate EMG activity and after negative drawback for blood. The patient tolerated the procedure well. Post procedure instructions were given. A followup appointment was made.

## 2017-09-14 DIAGNOSIS — M7541 Impingement syndrome of right shoulder: Secondary | ICD-10-CM | POA: Diagnosis not present

## 2017-09-17 DIAGNOSIS — M7541 Impingement syndrome of right shoulder: Secondary | ICD-10-CM | POA: Diagnosis not present

## 2017-10-09 DIAGNOSIS — E782 Mixed hyperlipidemia: Secondary | ICD-10-CM | POA: Diagnosis not present

## 2017-10-09 DIAGNOSIS — R799 Abnormal finding of blood chemistry, unspecified: Secondary | ICD-10-CM | POA: Diagnosis not present

## 2017-10-09 DIAGNOSIS — I10 Essential (primary) hypertension: Secondary | ICD-10-CM | POA: Diagnosis not present

## 2017-10-09 DIAGNOSIS — I6350 Cerebral infarction due to unspecified occlusion or stenosis of unspecified cerebral artery: Secondary | ICD-10-CM | POA: Diagnosis not present

## 2017-10-09 DIAGNOSIS — I69920 Aphasia following unspecified cerebrovascular disease: Secondary | ICD-10-CM | POA: Diagnosis not present

## 2017-10-23 ENCOUNTER — Ambulatory Visit: Payer: BC Managed Care – PPO | Admitting: Physical Medicine & Rehabilitation

## 2017-11-13 ENCOUNTER — Encounter: Payer: Medicare Other | Attending: Physical Medicine & Rehabilitation

## 2017-11-13 ENCOUNTER — Ambulatory Visit: Payer: Medicare Other | Admitting: Physical Medicine & Rehabilitation

## 2017-11-13 ENCOUNTER — Encounter: Payer: Self-pay | Admitting: Physical Medicine & Rehabilitation

## 2017-11-13 VITALS — BP 142/97 | HR 72 | Resp 14 | Ht 73.0 in | Wt 220.0 lb

## 2017-11-13 DIAGNOSIS — I10 Essential (primary) hypertension: Secondary | ICD-10-CM | POA: Insufficient documentation

## 2017-11-13 DIAGNOSIS — I69320 Aphasia following cerebral infarction: Secondary | ICD-10-CM | POA: Diagnosis not present

## 2017-11-13 DIAGNOSIS — G8111 Spastic hemiplegia affecting right dominant side: Secondary | ICD-10-CM

## 2017-11-13 DIAGNOSIS — Z8673 Personal history of transient ischemic attack (TIA), and cerebral infarction without residual deficits: Secondary | ICD-10-CM | POA: Insufficient documentation

## 2017-11-13 NOTE — Progress Notes (Signed)
Subjective:     Patient ID: Mario [name], male   DOB: 07-Sep-1975, 42 y.o.   MRN: [id number]  HPI 42 year old male with history of left MCA distribution infarct with right spastic hemiplegia and aphasia returns with chief complaint of right ankle stiffness.  The patient underwent botulinum toxin injection on 09/11/2017. Pectoralis75 Biceps75 FCR25 FCU25 FDS50 E FDP50 Lumbricals100 Patient states that he has had good relaxation of his right upper extremity and no longer has right shoulder pain.  His right ankle is not painful but is stiff.  He complains that it turns inward.  He has been using an Aircast to try to correct this but this has not been very successful.  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 Pain Inventory Average Pain 10 Pain Right Now 10 My pain is constant  In the last 24 hours, has pain interfered with the following? General activity 0 Relation with others 0 Enjoyment of life 0 What TIME of day is your pain at its worst? varies Sleep (in general) Good  Pain is worse with: unsure Pain improves with: medication Relief from Meds: n/a  Mobility walk without assistance  Function disabled: date disabled .  Neuro/Psych trouble walking  Prior Studies Any changes since last visit?  no  Physicians involved in your care Any changes since last visit?  no   Family History  Problem Relation Age of Onset  . Stroke Maternal Grandmother    Social History   Socioeconomic History  . Marital status: Married    Spouse name: Not on file  . Number of children: Not on file  . Years of education: Not on file  . Highest education level: Not on file  Occupational History  . Not on file  Social Needs  . Financial resource strain: Not on file  . Food insecurity:    Worry: Not on file    Inability: Not on file  . Transportation needs:    Medical: Not on file    Non-medical: Not  on file  Tobacco Use  . Smoking status: Never Smoker  . Smokeless tobacco: Never Used  Substance and Sexual Activity  . Alcohol use: No  . Drug use: No  . Sexual activity: Not on file  Lifestyle  . Physical activity:    Days per week: Not on file    Minutes per session: Not on file  . Stress: Not on file  Relationships  . Social connections:    Talks on phone: Not on file    Gets together: Not on file    Attends religious service: Not on file    Active member of club or organization: Not on file    Attends meetings of clubs or organizations: Not on file    Relationship status: Not on file  Other Topics Concern  . Not on file  Social History Narrative  . Not on file   Past Surgical History:  Procedure Laterality Date  . NO PAST SURGERIES     Past Medical History:  Diagnosis Date  . Hypertension   . Stroke (HCC)    BP (!) 142/97 (BP Location: Left Arm, Patient Position: Sitting, Cuff Size: Normal)   Pulse 72   Resp 14   Ht 6\' 1"  (1.854 m)   Wt 220 lb (99.8 kg)   SpO2 97%   BMI 29.03 kg/m   Opioid Risk Score:   Fall Risk Score:  `1  Depression  screen PHQ 2/9  Depression screen [facility] 2/9 01/01/2017 04/13/2015  Decreased Interest 3 0  Down, Depressed, Hopeless 3 1  PHQ - 2 Score 6 1  Altered sleeping - 0  Tired, decreased energy - 0  Change in appetite - 1  Feeling bad or failure about yourself  - 1  Trouble concentrating - 1  Moving slowly or fidgety/restless - 1  Suicidal thoughts - 0  PHQ-9 Score - 5    Review of Systems     Objective:   Physical Exam     Assessment:     General no acute distress, Mood and affect are appropriate Speech is a phasic he is able to express himself with single words and occasional short phrase He has 4- strength in the right deltoid bicep tricep finger flexors and extensors.  He does have decreased fine motor control in the right hand with finger to thumb opposition. Patient has decreased dexterity with repeated  flexion and extension of his fingers. Tone Modified [place] 2 at the finger flexors and wrist flexors 1 at the elbow flexors and 0-1 at the pectoralis. MAS 3 at the right foot inverters.  MAS 3 in the right ankle dorsiflexors Gait patient has poor heel strike, slight clawing of toes including the hallux, inversion of foot during swing phase which only partially corrects with stance    Plan:     1.  Right spastic hemiplegia.  Has had good results with botulinum toxin injection for the upper extremity however he is at the maximum dosage and has some lower extremity issues which would benefit from additional botulinum toxin.  We discussed his options including the use of Dysport which is now FDA approved for dose up to 1500 units per session.  This would allow treatment of both the upper and lower limb   Will change to Dysport Right dominant side Pectoralis  200 Biceps 200 FCR 100 FCU 100 FDS 100 FDP 100 Lumbricals 200  Tib post 300 Gastroc 200

## 2017-11-13 NOTE — Patient Instructions (Signed)
AbobotulinumtoxinA injection What is this medicine? ABOBOTULINUMTOXINA (ay boh BOT yoo li num TOX in A) is a neuro-muscular blocker. This medicine is used to treat severe neck muscle spasms. It is also used to treat muscle spasms in the elbow, wrist, and finger muscles in adults and in the calf muscles in children. It is also used to treat frown lines or lines between the eyebrows on the face. This medicine may be used for other purposes; ask your health care provider or pharmacist if you have questions. COMMON BRAND NAME(S): Dysport What should I tell my health care provider before I take this medicine? They need to know if you have any of these conditions: -breathing problems -diabetes -heart problems -history of surgery where this medicine is going to be used -infection where this medicine is going to be used -myasthenia gravis or other neurologic disease -nerve or muscle disease -surgery plans -an unusual or allergic reaction to botulinum toxin, albumin, cow's milk protein, other medicines, foods, dyes, or preservatives -pregnant or trying to get pregnant -breast-feeding How should I use this medicine? This medicine is for injection into a muscle. It is given by a health care professional in a hospital or clinic setting. A special MedGuide will be given to you with each prescription and refill. Be sure to read this information carefully each time. Talk to your pediatrician regarding the use of this medicine in children. While this drug may be prescribed for children as young as 2 years for selected conditions, precautions to apply. Overdosage: If you think you have taken too much of this medicine contact a poison control center or emergency room at once. NOTE: This medicine is only for you. Do not share this medicine with others. What if I miss a dose? This does not apply. What may interact with this medicine? -aminoglycoside antibiotics like gentamicin, neomycin, tobramycin -muscle  relaxants -other botulinum toxin injections This list may not describe all possible interactions. Give your health care provider a list of all the medicines, herbs, non-prescription drugs, or dietary supplements you use. Also tell them if you smoke, drink alcohol, or use illegal drugs. Some items may interact with your medicine. What should I watch for while using this medicine? Visit your doctor for regular check ups. This medicine will cause weakness in the muscle where it is injected. Tell your doctor if you feel unusually weak in other muscles. Get medical help right away if you have problems with breathing, swallowing, or talking. This medicine contains albumin from human blood. It may be possible to pass an infection in this medicine, but no cases have been reported. Talk to your doctor about the risks and benefits of this medicine. If your activities have been limited by your condition, go back to your regular routine slowly after treatment with this medicine. This medicine can make your muscles weak. And, this medicine can make your eyelids droop or make you see blurry or double. If you have weak muscles or trouble seeing do not drive a car, use machinery, or do other dangerous activities. What side effects may I notice from receiving this medicine? Side effects that you should report to your doctor or health care professional as soon as possible: -allergic reactions like skin rash, itching or hives, swelling of the face, lips, or tongue -breathing problems -changes in vision -chest pain or tightness -eye swelling or drooping of the eyelids -fast, irregular heartbeat -loss of bladder control -numbness or weakness -signs and symptoms of infection like fever or chills;   cough; flu-like symptoms; sore throat -numbness or weakness -speech problems -swallowing problems Side effects that usually do not require medical attention (report to your doctor or health care professional if they  continue or are bothersome): -bruising or pain at site where injected -dizziness -dry eyes or eye irritation -dry mouth -headache -runny nose This list may not describe all possible side effects. Call your doctor for medical advice about side effects. You may report side effects to FDA at 1-800-FDA-1088. Where should I keep my medicine? This drug is given in a hospital or clinic and will not be stored at home. NOTE: This sheet is a summary. It may not cover all possible information. If you have questions about this medicine, talk to your doctor, pharmacist, or health care provider.  2018 Elsevier/Gold Standard (2015-01-06 14:54:52)  

## 2017-12-11 ENCOUNTER — Encounter: Payer: Medicare Other | Attending: Physical Medicine & Rehabilitation

## 2017-12-11 ENCOUNTER — Ambulatory Visit (HOSPITAL_BASED_OUTPATIENT_CLINIC_OR_DEPARTMENT_OTHER): Payer: Medicare Other | Admitting: Physical Medicine & Rehabilitation

## 2017-12-11 ENCOUNTER — Encounter: Payer: Self-pay | Admitting: Physical Medicine & Rehabilitation

## 2017-12-11 VITALS — BP 123/92 | HR 73 | Ht 72.0 in | Wt 225.0 lb

## 2017-12-11 DIAGNOSIS — G8111 Spastic hemiplegia affecting right dominant side: Secondary | ICD-10-CM | POA: Insufficient documentation

## 2017-12-11 DIAGNOSIS — Z8673 Personal history of transient ischemic attack (TIA), and cerebral infarction without residual deficits: Secondary | ICD-10-CM | POA: Insufficient documentation

## 2017-12-11 DIAGNOSIS — I10 Essential (primary) hypertension: Secondary | ICD-10-CM | POA: Insufficient documentation

## 2017-12-11 DIAGNOSIS — I69320 Aphasia following cerebral infarction: Secondary | ICD-10-CM | POA: Diagnosis not present

## 2017-12-11 NOTE — Progress Notes (Signed)
Dysport Injection for spasticity using needle EMG guidance  Dilution: 200 Units/ml Indication: Severe spasticity which interferes with ADL,mobility and/or  hygiene and is unresponsive to medication management and other conservative care Informed consent was obtained after describing risks and benefits of the procedure with the patient. This includes bleeding, bruising, infection, excessive weakness, or medication side effects. A REMS form is on file and signed. Needle:  needle electrode 25g 2" for Leg 27g 1" for Pec and arm and forearm, 30g 1" for hand Number of units per muscle Right dominant side Pectoralis  200 Biceps 200 FCR 100 FCU 100 FDS 100 FDP 100 Lumbricals 200  Tib post 300 Gastroc 200 All injections were done after obtaining appropriate EMG activity and after negative drawback for blood. The patient tolerated the procedure well. Post procedure instructions were given. A followup appointment was made.

## 2017-12-11 NOTE — Patient Instructions (Signed)

## 2017-12-26 DIAGNOSIS — I69398 Other sequelae of cerebral infarction: Secondary | ICD-10-CM | POA: Diagnosis not present

## 2017-12-26 DIAGNOSIS — G890 Central pain syndrome: Secondary | ICD-10-CM | POA: Diagnosis not present

## 2017-12-26 DIAGNOSIS — G8110 Spastic hemiplegia affecting unspecified side: Secondary | ICD-10-CM | POA: Diagnosis not present

## 2017-12-26 DIAGNOSIS — Z8673 Personal history of transient ischemic attack (TIA), and cerebral infarction without residual deficits: Secondary | ICD-10-CM | POA: Diagnosis not present

## 2017-12-26 DIAGNOSIS — I10 Essential (primary) hypertension: Secondary | ICD-10-CM | POA: Diagnosis not present

## 2017-12-26 DIAGNOSIS — E785 Hyperlipidemia, unspecified: Secondary | ICD-10-CM | POA: Insufficient documentation

## 2018-01-22 ENCOUNTER — Ambulatory Visit (HOSPITAL_BASED_OUTPATIENT_CLINIC_OR_DEPARTMENT_OTHER): Payer: Medicare Other | Admitting: Physical Medicine & Rehabilitation

## 2018-01-22 ENCOUNTER — Encounter: Payer: Medicare Other | Attending: Physical Medicine & Rehabilitation

## 2018-01-22 ENCOUNTER — Encounter: Payer: Self-pay | Admitting: Physical Medicine & Rehabilitation

## 2018-01-22 VITALS — BP 135/94 | HR 103 | Ht 73.0 in | Wt 216.0 lb

## 2018-01-22 DIAGNOSIS — G8111 Spastic hemiplegia affecting right dominant side: Secondary | ICD-10-CM | POA: Insufficient documentation

## 2018-01-22 DIAGNOSIS — I69320 Aphasia following cerebral infarction: Secondary | ICD-10-CM | POA: Insufficient documentation

## 2018-01-22 DIAGNOSIS — Z8673 Personal history of transient ischemic attack (TIA), and cerebral infarction without residual deficits: Secondary | ICD-10-CM | POA: Insufficient documentation

## 2018-01-22 DIAGNOSIS — I10 Essential (primary) hypertension: Secondary | ICD-10-CM | POA: Diagnosis not present

## 2018-01-22 NOTE — Progress Notes (Signed)
Subjective:    Patient ID: Mario [name], male    DOB: 1975/07/12, 42 y.o.   MRN: [id number]  HPI  42 year old male with chronic right spastic hemiplegia and aphasia steroid secondary to left MCA distribution infarct is spasticity interferes with ambulation as well as right upper extremity functional usage. Patient underwent abo botulinum toxin injection Dysport 12/11/2017 Right dominant side Pectoralis  200 Biceps 200 FCR 100 FCU 100 FDS 100 FDP 100 Lumbricals 200  Tib post 300 Gastroc 200 Patient notes improvements in right upper extremity functional usage overhead grasping grasp and release and elbow flexion extension.  Right lower extremity feels a little looser however still having some problems clearing his right toes Pain Inventory Average Pain 0 Pain Right Now 0 My pain is no pain no pain In the last 24 hours, has pain interfered with the following? General activity 0 Relation with others 0 Enjoyment of life 0 What TIME of day is your pain at its worst? no pain Sleep (in general) Poor  Pain is worse with: no pain Pain improves with: NA Relief from Meds: NA  Mobility walk without assistance  Function disabled: date disabled .  Neuro/Psych tingling trouble walking dizziness  Prior Studies Any changes since last visit?  no  Physicians involved in your care Any changes since last visit?  no   Family History  Problem Relation Age of Onset  . Stroke Maternal Grandmother    Social History   Socioeconomic History  . Marital status: Married    Spouse name: Not on file  . Number of children: Not on file  . Years of education: Not on file  . Highest education level: Not on file  Occupational History  . Not on file  Social Needs  . Financial resource strain: Not on file  . Food insecurity:    Worry: Not on file    Inability: Not on file  . Transportation needs:    Medical: Not on file    Non-medical: Not on file  Tobacco Use  . Smoking  status: Never Smoker  . Smokeless tobacco: Never Used  Substance and Sexual Activity  . Alcohol use: No  . Drug use: No  . Sexual activity: Not on file  Lifestyle  . Physical activity:    Days per week: Not on file    Minutes per session: Not on file  . Stress: Not on file  Relationships  . Social connections:    Talks on phone: Not on file    Gets together: Not on file    Attends religious service: Not on file    Active member of club or organization: Not on file    Attends meetings of clubs or organizations: Not on file    Relationship status: Not on file  Other Topics Concern  . Not on file  Social History Narrative  . Not on file   Past Surgical History:  Procedure Laterality Date  . NO PAST SURGERIES     Past Medical History:  Diagnosis Date  . Hypertension   . Stroke (HCC)    BP (!) 135/94   Pulse (!) 103   Ht 6\' 1"  (1.854 m)   Wt 216 lb (98 kg)   SpO2 96%   BMI 28.50 kg/m   Opioid Risk Score:   Fall Risk Score:  `1  Depression screen PHQ 2/9  [facility] 2/9 01/01/2017 04/13/2015  Decreased Interest 3 0  Down, Depressed, Hopeless 3 1  PHQ -  2 Score 6 1  Altered sleeping - 0  Tired, decreased energy - 0  Change in appetite - 1  Feeling bad or failure about yourself  - 1  Trouble concentrating - 1  Moving slowly or fidgety/restless - 1  Suicidal thoughts - 0  PHQ-9 Score - 5     Review of Systems  Constitutional: Negative.   HENT: Negative.   Eyes: Negative.   Respiratory: Negative.   Cardiovascular: Negative.   Gastrointestinal: Negative.   Endocrine: Negative.   Genitourinary: Negative.   Musculoskeletal: Positive for gait problem.  Skin: Negative.   Allergic/Immunologic: Negative.   Neurological: Positive for dizziness.       Tingling  Hematological: Negative.   Psychiatric/Behavioral: Negative.   All other systems reviewed and are negative.      Objective:   Physical Exam  Well-developed well-nourished male in no  acute distress Mood and affect appropriate Strength is 4/5 in the right deltoid, bicep, tricep, grip, finger flexors and extensors He has decreased fine motor coordination right upper extremity. Right lower extremity has 5/5 strength in hip flexor knee extensor 3- and ankle dorsiflexor. Patient has MAS 1 spasticity in the elbow flexors and 0 at the finger flexors on the right side.  0 at the wrist flexors on the right side MAS 3 in the plantar flexors and foot inverters on the right side Gait shows mild toe drag no evidence of knee instability mild foot inversion on the right side.      Assessment & Plan:  #1.  Right spastic hemiplegia secondary to left MCA distribution CVA Dysport 12/11/2017, was effective for the upper extremity and the lower extremity still has some inversion at the ankle as well as plantarflexion during gait cycle. Right dominant side Pectoralis  200 Biceps 200 FCR 100 FCU 100 FDS 100 FDP 100 Lumbricals 200  Tib post 300 Gastroc 200  Will do US guided injection for Right tib post and gastroc

## 2018-01-28 ENCOUNTER — Telehealth: Payer: Self-pay | Admitting: *Deleted

## 2018-01-28 NOTE — Telephone Encounter (Signed)
Mrs Mario [name] called and is requesting that we send her a copy of a letter that was written about Mario [name] a couple of years ago...she says 11/10/14.  She is requesting that we send to her email, but there is no letter in the system.  I have let her know.  She may need to check with primary physician.

## 2018-03-12 ENCOUNTER — Ambulatory Visit (HOSPITAL_BASED_OUTPATIENT_CLINIC_OR_DEPARTMENT_OTHER): Payer: Medicare Other | Admitting: Physical Medicine & Rehabilitation

## 2018-03-12 ENCOUNTER — Other Ambulatory Visit: Payer: Self-pay

## 2018-03-12 ENCOUNTER — Encounter: Payer: Medicare Other | Attending: Physical Medicine & Rehabilitation

## 2018-03-12 VITALS — BP 120/85 | HR 98 | Ht 73.0 in | Wt 214.0 lb

## 2018-03-12 DIAGNOSIS — I69320 Aphasia following cerebral infarction: Secondary | ICD-10-CM | POA: Insufficient documentation

## 2018-03-12 DIAGNOSIS — G8111 Spastic hemiplegia affecting right dominant side: Secondary | ICD-10-CM | POA: Diagnosis not present

## 2018-03-12 DIAGNOSIS — I10 Essential (primary) hypertension: Secondary | ICD-10-CM | POA: Insufficient documentation

## 2018-03-12 DIAGNOSIS — Z8673 Personal history of transient ischemic attack (TIA), and cerebral infarction without residual deficits: Secondary | ICD-10-CM | POA: Diagnosis not present

## 2018-03-12 NOTE — Progress Notes (Signed)
Dysport Injection for spasticity using needle EMG guidance  Dilution: 200 Units/ml Indication: Severe spasticity which interferes with ADL,mobility and/or  hygiene and is unresponsive to medication management and other conservative care Informed consent was obtained after describing risks and benefits of the procedure with the patient. This includes bleeding, bruising, infection, excessive weakness, or medication side effects. A REMS form is on file and signed. Needle:  needle electrode Number of units per muscle Pectoralis  200 Biceps 200 FCR 100 FCU 100 FDS 100 FDP 100 Lumbricals 100   Ultrasound guided Tib post 300 Gastroc 200 All UE injections were done after obtaining appropriate EMG activity and after negative drawback for blood.The lower ext Ultrasound guided injections were performed out of plane approach with 22g ECHO bloc 80mm needle The patient tolerated the procedure well. Post procedure instructions were given. A followup appointment was made.

## 2018-03-12 NOTE — Patient Instructions (Signed)

## 2018-03-19 DIAGNOSIS — H9193 Unspecified hearing loss, bilateral: Secondary | ICD-10-CM | POA: Diagnosis not present

## 2018-03-19 DIAGNOSIS — Z8673 Personal history of transient ischemic attack (TIA), and cerebral infarction without residual deficits: Secondary | ICD-10-CM | POA: Diagnosis not present

## 2018-03-19 DIAGNOSIS — I10 Essential (primary) hypertension: Secondary | ICD-10-CM | POA: Diagnosis not present

## 2018-03-19 DIAGNOSIS — H9313 Tinnitus, bilateral: Secondary | ICD-10-CM | POA: Diagnosis not present

## 2018-04-11 ENCOUNTER — Telehealth (HOSPITAL_COMMUNITY): Payer: Self-pay | Admitting: Radiology

## 2018-04-11 DIAGNOSIS — I6350 Cerebral infarction due to unspecified occlusion or stenosis of unspecified cerebral artery: Secondary | ICD-10-CM | POA: Diagnosis not present

## 2018-04-11 DIAGNOSIS — I10 Essential (primary) hypertension: Secondary | ICD-10-CM | POA: Diagnosis not present

## 2018-04-11 DIAGNOSIS — E782 Mixed hyperlipidemia: Secondary | ICD-10-CM | POA: Diagnosis not present

## 2018-04-11 DIAGNOSIS — I69920 Aphasia following unspecified cerebrovascular disease: Secondary | ICD-10-CM | POA: Diagnosis not present

## 2018-04-11 NOTE — Telephone Encounter (Signed)
Pt is now being seen by neuro at [facility] and wishes to pursue his care there from now on. JM

## 2018-04-22 ENCOUNTER — Ambulatory Visit: Payer: Medicare Other | Admitting: Physical Medicine & Rehabilitation

## 2018-04-30 ENCOUNTER — Encounter: Payer: Self-pay | Admitting: Physical Medicine & Rehabilitation

## 2018-04-30 ENCOUNTER — Encounter: Payer: Medicare Other | Attending: Physical Medicine & Rehabilitation

## 2018-04-30 ENCOUNTER — Ambulatory Visit (HOSPITAL_BASED_OUTPATIENT_CLINIC_OR_DEPARTMENT_OTHER): Payer: Medicare Other | Admitting: Physical Medicine & Rehabilitation

## 2018-04-30 VITALS — BP 122/87 | HR 96 | Ht 73.0 in | Wt 220.0 lb

## 2018-04-30 DIAGNOSIS — G8111 Spastic hemiplegia affecting right dominant side: Secondary | ICD-10-CM | POA: Insufficient documentation

## 2018-04-30 DIAGNOSIS — I69320 Aphasia following cerebral infarction: Secondary | ICD-10-CM

## 2018-04-30 DIAGNOSIS — I10 Essential (primary) hypertension: Secondary | ICD-10-CM | POA: Diagnosis not present

## 2018-04-30 DIAGNOSIS — Z8673 Personal history of transient ischemic attack (TIA), and cerebral infarction without residual deficits: Secondary | ICD-10-CM | POA: Insufficient documentation

## 2018-04-30 DIAGNOSIS — I69359 Hemiplegia and hemiparesis following cerebral infarction affecting unspecified side: Secondary | ICD-10-CM | POA: Diagnosis not present

## 2018-04-30 NOTE — Progress Notes (Signed)
Subjective:    Patient ID: Mario [name], male    DOB: [birth year], 42 y.o.   MRN: [id number]  HPI F/u 6 wk post botoulinum toxin injection-Dysport Pectoralis  200 Biceps 200 FCR 100 FCU 100 FDS 100 FDP 100 Lumbricals 100   Ultrasound guided Tib post 300 Gastroc 200  Pt feels flexibility of ankle has improved since using US guidance for gastroc and tib post Pain Inventory Average Pain 0 Pain Right Now 0 My pain is na  In the last 24 hours, has pain interfered with the following? General activity 0 Relation with others 0 Enjoyment of life 0 What TIME of day is your pain at its worst? na Sleep (in general) Good  Pain is worse with: na Pain improves with: na Relief from Meds: 0  Mobility walk without assistance  Function Do you have any goals in this area?  yes  Neuro/Psych No problems in this area  Prior Studies Any changes since last visit?  no  Physicians involved in your care Any changes since last visit?  no   Family History  Problem Relation Age of Onset  . Stroke Maternal Grandmother    Social History   Socioeconomic History  . Marital status: Married    Spouse name: Not on file  . Number of children: Not on file  . Years of education: Not on file  . Highest education level: Not on file  Occupational History  . Not on file  Social Needs  . Financial resource strain: Not on file  . Food insecurity:    Worry: Not on file    Inability: Not on file  . Transportation needs:    Medical: Not on file    Non-medical: Not on file  Tobacco Use  . Smoking status: Never Smoker  . Smokeless tobacco: Never Used  Substance and Sexual Activity  . Alcohol use: No  . Drug use: No  . Sexual activity: Not on file  Lifestyle  . Physical activity:    Days per week: Not on file    Minutes per session: Not on file  . Stress: Not on file  Relationships  . Social connections:    Talks on phone: Not on file    Gets together: Not on file    Attends  religious service: Not on file    Active member of club or organization: Not on file    Attends meetings of clubs or organizations: Not on file    Relationship status: Not on file  Other Topics Concern  . Not on file  Social History Narrative  . Not on file   Past Surgical History:  Procedure Laterality Date  . NO PAST SURGERIES     Past Medical History:  Diagnosis Date  . Hypertension   . Stroke (HCC)    BP 122/87   Pulse 96   Ht 6\' 1"  (1.854 m)   Wt 220 lb (99.8 kg)   SpO2 95%   BMI 29.03 kg/m   Opioid Risk Score:   Fall Risk Score:  `1  Depression screen PHQ 2/9  Depression screen [facility] 2/9 03/12/2018 01/01/2017 04/13/2015  Decreased Interest 0 3 0  Down, Depressed, Hopeless 0 3 1  PHQ - 2 Score 0 6 1  Altered sleeping - - 0  Tired, decreased energy - - 0  Change in appetite - - 1  Feeling bad or failure about yourself  - - 1  Trouble concentrating - - 1  Moving slowly  or fidgety/restless - - 1  Suicidal thoughts - - 0  PHQ-9 Score - - 5     Review of Systems  Constitutional: Negative.   HENT: Negative.   Eyes: Negative.   Respiratory: Negative.   Cardiovascular: Negative.   Gastrointestinal: Negative.   Endocrine: Negative.   Genitourinary: Negative.   Musculoskeletal: Negative.   Skin: Negative.   Allergic/Immunologic: Negative.   Neurological: Negative.   Hematological: Negative.   Psychiatric/Behavioral: Negative.   All other systems reviewed and are negative.      Objective:   Physical Exam  Constitutional: He is oriented to person, place, and time. He appears well-developed and well-nourished.  HENT:  Head: Normocephalic and atraumatic.  Eyes: Pupils are equal, round, and reactive to light. EOM are normal.  Neck: Normal range of motion.  Neurological: He is alert and oriented to person, place, and time.  Skin: Skin is warm.  Psychiatric: He has a normal mood and affect.  Nursing note and vitals reviewed. Motor strength is 4/5 in the  right deltoid bicep tricep grip hip flexor knee extensor 3- ankle dorsiflexor and plantar flexor on the right 5/5 strength on the left side Tone MAS 1 in biceps finger flexors wrist flexors 2 in the lumbricals 3 in the ankle plantar flexors Gait using supramalleolar orthosis no evidence of toe drag or knee instability he does have some tendency towards inversion at the ankle        Assessment & Plan:  1.  Right spastic hemiplegia secondary to left MCA distribution infarct.  He has had excellent results with his Dysport injection.  Ankle dorsiflexion passively has improved with the relaxation of the plantar flexors. Would recommend ultrasound guidance on subsequent Botox injections in the right lower extremity. We will continue same dosing and muscle group selection next visit 6 weeks

## 2018-06-10 ENCOUNTER — Other Ambulatory Visit: Payer: Self-pay

## 2018-06-10 NOTE — Patient Outreach (Signed)
[facility]) Care Management  06/10/2018  LENNON KUCHARSKI May 24, 1976 [id number]   Medication Adherence call to Mr. Bohumil Kerkman left a message for patient to call back patient is due on Atorvastatin 40 mg and Losartan /HCTZ 100/12.5 mg. Mr. Nuttall is showing past due under [employer].   Lillia Abed CPhT Pharmacy Technician [facility] Care Management Direct Dial [phone]  Fax [phone] [email or web address]

## 2018-06-11 ENCOUNTER — Encounter: Payer: Self-pay | Admitting: Physical Medicine & Rehabilitation

## 2018-06-11 ENCOUNTER — Encounter: Payer: Medicare Other | Attending: Physical Medicine & Rehabilitation

## 2018-06-11 ENCOUNTER — Ambulatory Visit (HOSPITAL_BASED_OUTPATIENT_CLINIC_OR_DEPARTMENT_OTHER): Payer: Medicare Other | Admitting: Physical Medicine & Rehabilitation

## 2018-06-11 VITALS — BP 140/91 | HR 100 | Ht 73.0 in | Wt 220.0 lb

## 2018-06-11 DIAGNOSIS — I69320 Aphasia following cerebral infarction: Secondary | ICD-10-CM | POA: Diagnosis not present

## 2018-06-11 DIAGNOSIS — G8111 Spastic hemiplegia affecting right dominant side: Secondary | ICD-10-CM | POA: Diagnosis not present

## 2018-06-11 DIAGNOSIS — Z8673 Personal history of transient ischemic attack (TIA), and cerebral infarction without residual deficits: Secondary | ICD-10-CM | POA: Insufficient documentation

## 2018-06-11 DIAGNOSIS — I10 Essential (primary) hypertension: Secondary | ICD-10-CM | POA: Diagnosis not present

## 2018-06-11 NOTE — Patient Instructions (Signed)

## 2018-06-11 NOTE — Progress Notes (Signed)
Dysport Injection for spasticity using needle EMG guidance  Dilution: 200 Units/ml Indication: Severe spasticity which interferes with ADL,mobility and/or  hygiene and is unresponsive to medication management and other conservative care Informed consent was obtained after describing risks and benefits of the procedure with the patient. This includes bleeding, bruising, infection, excessive weakness, or medication side effects. A REMS form is on file and signed. Needle: 27g 1" and 30g 1" needle electrode for UE, 25g 2" for LE Number of units per muscle Pectoralis  200 Biceps 200 FCR 100 FCU 100 FDS 100 FDP 100 Lumbricals 100   Ultrasound guided Tib post 300 Gastroc 200  100U waste All UE injections were done after obtaining appropriate EMG activity  and after negative drawback for blood.The lower ext Ultrasound guided injections were performed out of plane approach with 25g 36mm needle electrode The patient tolerated the procedure well. Post procedure instructions were given. A followup appointment was made.   Based on EMG activity may reduce pectoralis and biceps to 100U, D/C lumbricals- depends on clinical response Increase gastroc to 300U May be able to order 1300U for next injection,check clinical response

## 2018-07-01 DIAGNOSIS — I10 Essential (primary) hypertension: Secondary | ICD-10-CM | POA: Diagnosis not present

## 2018-07-01 DIAGNOSIS — R482 Apraxia: Secondary | ICD-10-CM | POA: Diagnosis not present

## 2018-07-01 DIAGNOSIS — I69320 Aphasia following cerebral infarction: Secondary | ICD-10-CM | POA: Diagnosis not present

## 2018-07-01 DIAGNOSIS — H9311 Tinnitus, right ear: Secondary | ICD-10-CM | POA: Diagnosis not present

## 2018-07-01 DIAGNOSIS — R202 Paresthesia of skin: Secondary | ICD-10-CM | POA: Diagnosis not present

## 2018-07-01 DIAGNOSIS — I69351 Hemiplegia and hemiparesis following cerebral infarction affecting right dominant side: Secondary | ICD-10-CM | POA: Diagnosis not present

## 2018-07-01 DIAGNOSIS — H9313 Tinnitus, bilateral: Secondary | ICD-10-CM | POA: Diagnosis not present

## 2018-07-17 DIAGNOSIS — I69320 Aphasia following cerebral infarction: Secondary | ICD-10-CM | POA: Diagnosis not present

## 2018-07-17 DIAGNOSIS — R482 Apraxia: Secondary | ICD-10-CM | POA: Diagnosis not present

## 2018-07-23 ENCOUNTER — Other Ambulatory Visit: Payer: Self-pay

## 2018-07-23 ENCOUNTER — Encounter: Payer: Medicare Other | Attending: Physical Medicine & Rehabilitation

## 2018-07-23 ENCOUNTER — Ambulatory Visit (HOSPITAL_BASED_OUTPATIENT_CLINIC_OR_DEPARTMENT_OTHER): Payer: Medicare Other | Admitting: Physical Medicine & Rehabilitation

## 2018-07-23 ENCOUNTER — Encounter: Payer: Self-pay | Admitting: Physical Medicine & Rehabilitation

## 2018-07-23 VITALS — BP 138/93 | HR 86 | Ht 73.0 in | Wt 212.2 lb

## 2018-07-23 DIAGNOSIS — G8111 Spastic hemiplegia affecting right dominant side: Secondary | ICD-10-CM | POA: Diagnosis not present

## 2018-07-23 DIAGNOSIS — I69320 Aphasia following cerebral infarction: Secondary | ICD-10-CM | POA: Diagnosis not present

## 2018-07-23 DIAGNOSIS — I10 Essential (primary) hypertension: Secondary | ICD-10-CM | POA: Diagnosis not present

## 2018-07-23 DIAGNOSIS — Z8673 Personal history of transient ischemic attack (TIA), and cerebral infarction without residual deficits: Secondary | ICD-10-CM | POA: Diagnosis not present

## 2018-07-23 NOTE — Progress Notes (Signed)
Subjective:    Patient ID: Mario [name], male    DOB: September 07, 1975, 43 y.o.   MRN: [id number]  HPI 06/11/2018 Pectoralis  200 Biceps 200 FCR 100 FCU 100 FDS 100 FDP 100 Lumbricals 100   Ultrasound guided Tib post 300 Gastroc 200  Pt feels UE results very good, lower extremity still has toe curling and foot inversion Pain Inventory Average Pain 0 Pain Right Now 0 My pain is numbness and weakness  In the last 24 hours, has pain interfered with the following? General activity 0 Relation with others 0 Enjoyment of life 0 What TIME of day is your pain at its worst? na Sleep (in general) Fair  Pain is worse with: na Pain improves with: na Relief from Meds: na  Mobility how many minutes can you walk? 60 ability to climb steps?  yes do you drive?  yes  Function disabled: date disabled na  Neuro/Psych weakness numbness  Prior Studies Any changes since last visit?  no  Physicians involved in your care Any changes since last visit?  no Neurologist Dr. Adrian Prows   Family History  Problem Relation Age of Onset  . Stroke Maternal Grandmother    Social History   Socioeconomic History  . Marital status: Married    Spouse name: Not on file  . Number of children: Not on file  . Years of education: Not on file  . Highest education level: Not on file  Occupational History  . Not on file  Social Needs  . Financial resource strain: Not on file  . Food insecurity:    Worry: Not on file    Inability: Not on file  . Transportation needs:    Medical: Not on file    Non-medical: Not on file  Tobacco Use  . Smoking status: Never Smoker  . Smokeless tobacco: Never Used  Substance and Sexual Activity  . Alcohol use: No  . Drug use: No  . Sexual activity: Not on file  Lifestyle  . Physical activity:    Days per week: Not on file    Minutes per session: Not on file  . Stress: Not on file  Relationships  . Social connections:    Talks on  phone: Not on file    Gets together: Not on file    Attends religious service: Not on file    Active member of club or organization: Not on file    Attends meetings of clubs or organizations: Not on file    Relationship status: Not on file  Other Topics Concern  . Not on file  Social History Narrative  . Not on file   Past Surgical History:  Procedure Laterality Date  . NO PAST SURGERIES     Past Medical History:  Diagnosis Date  . Hypertension   . Stroke (HCC)    BP (!) 138/93   Pulse 86   Ht 6\' 1"  (1.854 m)   Wt 212 lb 3.2 oz (96.3 kg)   SpO2 97%   BMI 28.00 kg/m   Opioid Risk Score:   Fall Risk Score:  `1  Depression screen PHQ 2/9  Depression screen [facility] 2/9 03/12/2018 01/01/2017 04/13/2015  Decreased Interest 0 3 0  Down, Depressed, Hopeless 0 3 1  PHQ - 2 Score 0 6 1  Altered sleeping - - 0  Tired, decreased energy - - 0  Change in appetite - - 1  Feeling bad or failure about yourself  - - 1  Trouble concentrating - - 1  Moving slowly or fidgety/restless - - 1  Suicidal thoughts - - 0  PHQ-9 Score - - 5    Review of Systems  Constitutional: Negative.   HENT: Negative.   Eyes: Negative.   Respiratory: Negative.   Cardiovascular: Negative.   Gastrointestinal: Negative.   Endocrine: Negative.   Genitourinary: Negative.   Musculoskeletal: Positive for myalgias.  Skin: Negative.   Allergic/Immunologic: Negative.   Neurological: Positive for weakness and numbness.  Hematological: Negative.   Psychiatric/Behavioral: Negative.   All other systems reviewed and are negative.      Objective:   Physical Exam Vitals signs and nursing note reviewed.  Constitutional:      Appearance: Normal appearance.  HENT:     Head: Normocephalic.     Mouth/Throat:     Mouth: Mucous membranes are moist.  Eyes:     Pupils: Pupils are equal, round, and reactive to light.  Neurological:     General: No focal deficit present.     Mental Status: He is alert and oriented  to person, place, and time.  Psychiatric:        Mood and Affect: Mood normal.        Behavior: Behavior normal.           Assessment & Plan:  1.  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

## 2018-07-31 DIAGNOSIS — I69320 Aphasia following cerebral infarction: Secondary | ICD-10-CM | POA: Diagnosis not present

## 2018-07-31 DIAGNOSIS — R482 Apraxia: Secondary | ICD-10-CM | POA: Diagnosis not present

## 2018-08-02 DIAGNOSIS — G8111 Spastic hemiplegia affecting right dominant side: Secondary | ICD-10-CM | POA: Diagnosis not present

## 2018-08-07 DIAGNOSIS — R482 Apraxia: Secondary | ICD-10-CM | POA: Diagnosis not present

## 2018-08-07 DIAGNOSIS — I69320 Aphasia following cerebral infarction: Secondary | ICD-10-CM | POA: Diagnosis not present

## 2018-08-14 DIAGNOSIS — R482 Apraxia: Secondary | ICD-10-CM | POA: Diagnosis not present

## 2018-08-14 DIAGNOSIS — I69320 Aphasia following cerebral infarction: Secondary | ICD-10-CM | POA: Diagnosis not present

## 2018-09-10 ENCOUNTER — Encounter: Payer: Medicare Other | Attending: Physical Medicine & Rehabilitation

## 2018-09-10 ENCOUNTER — Ambulatory Visit (HOSPITAL_BASED_OUTPATIENT_CLINIC_OR_DEPARTMENT_OTHER): Payer: Medicare Other | Admitting: Physical Medicine & Rehabilitation

## 2018-09-10 ENCOUNTER — Encounter: Payer: Self-pay | Admitting: Physical Medicine & Rehabilitation

## 2018-09-10 ENCOUNTER — Other Ambulatory Visit: Payer: Self-pay

## 2018-09-10 VITALS — BP 122/86 | HR 86 | Ht 72.0 in | Wt 215.0 lb

## 2018-09-10 DIAGNOSIS — I10 Essential (primary) hypertension: Secondary | ICD-10-CM | POA: Insufficient documentation

## 2018-09-10 DIAGNOSIS — G8111 Spastic hemiplegia affecting right dominant side: Secondary | ICD-10-CM

## 2018-09-10 DIAGNOSIS — Z8673 Personal history of transient ischemic attack (TIA), and cerebral infarction without residual deficits: Secondary | ICD-10-CM | POA: Diagnosis not present

## 2018-09-10 DIAGNOSIS — I69320 Aphasia following cerebral infarction: Secondary | ICD-10-CM | POA: Insufficient documentation

## 2018-09-10 NOTE — Patient Instructions (Signed)

## 2018-09-10 NOTE — Progress Notes (Signed)
  Dysport Injection for spasticity using needle EMG guidance  Dilution: 200 Units/ml Indication: Severe spasticity which interferes with ADL,mobility and/or  hygiene and is unresponsive to medication management and other conservative care Informed consent was obtained after describing risks and benefits of the procedure with the patient. This includes bleeding, bruising, infection, excessive weakness, or medication side effects. A REMS form is on file and signed. Needle:  needle electrode Number of units per muscle  Right upper limb Biceps 200 FCR 100 FCU 100 FDS 100 FDP 100  Pectoralis 200  Ultrasound guided Tib post 400 Gastroc 300   All injections were done after obtaining appropriate EMG activity and after negative drawback for blood. The patient tolerated the procedure well. Post procedure instructions were given. A followup appointment was made.

## 2018-10-16 DIAGNOSIS — I10 Essential (primary) hypertension: Secondary | ICD-10-CM | POA: Diagnosis not present

## 2018-10-16 DIAGNOSIS — E782 Mixed hyperlipidemia: Secondary | ICD-10-CM | POA: Diagnosis not present

## 2018-10-16 DIAGNOSIS — I6350 Cerebral infarction due to unspecified occlusion or stenosis of unspecified cerebral artery: Secondary | ICD-10-CM | POA: Diagnosis not present

## 2018-10-16 DIAGNOSIS — I69920 Aphasia following unspecified cerebrovascular disease: Secondary | ICD-10-CM | POA: Diagnosis not present

## 2018-10-22 ENCOUNTER — Encounter: Payer: Medicare Other | Attending: Physical Medicine & Rehabilitation | Admitting: Physical Medicine & Rehabilitation

## 2018-10-22 DIAGNOSIS — G8111 Spastic hemiplegia affecting right dominant side: Secondary | ICD-10-CM | POA: Insufficient documentation

## 2018-10-22 DIAGNOSIS — I69320 Aphasia following cerebral infarction: Secondary | ICD-10-CM | POA: Insufficient documentation

## 2018-10-22 DIAGNOSIS — I10 Essential (primary) hypertension: Secondary | ICD-10-CM | POA: Insufficient documentation

## 2018-10-22 DIAGNOSIS — Z8673 Personal history of transient ischemic attack (TIA), and cerebral infarction without residual deficits: Secondary | ICD-10-CM | POA: Insufficient documentation

## 2018-12-30 DIAGNOSIS — R202 Paresthesia of skin: Secondary | ICD-10-CM | POA: Diagnosis not present

## 2018-12-30 DIAGNOSIS — H9313 Tinnitus, bilateral: Secondary | ICD-10-CM | POA: Diagnosis not present

## 2018-12-30 DIAGNOSIS — I69398 Other sequelae of cerebral infarction: Secondary | ICD-10-CM | POA: Diagnosis not present

## 2018-12-30 DIAGNOSIS — Z8673 Personal history of transient ischemic attack (TIA), and cerebral infarction without residual deficits: Secondary | ICD-10-CM | POA: Diagnosis not present

## 2018-12-30 DIAGNOSIS — H9312 Tinnitus, left ear: Secondary | ICD-10-CM | POA: Diagnosis not present

## 2018-12-30 DIAGNOSIS — I69320 Aphasia following cerebral infarction: Secondary | ICD-10-CM | POA: Diagnosis not present

## 2018-12-30 DIAGNOSIS — G629 Polyneuropathy, unspecified: Secondary | ICD-10-CM | POA: Diagnosis not present

## 2018-12-30 DIAGNOSIS — I10 Essential (primary) hypertension: Secondary | ICD-10-CM | POA: Diagnosis not present

## 2018-12-30 DIAGNOSIS — I6350 Cerebral infarction due to unspecified occlusion or stenosis of unspecified cerebral artery: Secondary | ICD-10-CM | POA: Diagnosis not present

## 2018-12-30 DIAGNOSIS — Z823 Family history of stroke: Secondary | ICD-10-CM | POA: Diagnosis not present

## 2018-12-30 DIAGNOSIS — R252 Cramp and spasm: Secondary | ICD-10-CM | POA: Diagnosis not present

## 2019-01-27 DIAGNOSIS — I6350 Cerebral infarction due to unspecified occlusion or stenosis of unspecified cerebral artery: Secondary | ICD-10-CM | POA: Diagnosis not present

## 2019-04-22 DIAGNOSIS — E782 Mixed hyperlipidemia: Secondary | ICD-10-CM | POA: Diagnosis not present

## 2019-04-22 DIAGNOSIS — I69920 Aphasia following unspecified cerebrovascular disease: Secondary | ICD-10-CM | POA: Diagnosis not present

## 2019-04-22 DIAGNOSIS — I10 Essential (primary) hypertension: Secondary | ICD-10-CM | POA: Diagnosis not present

## 2019-04-22 DIAGNOSIS — I6350 Cerebral infarction due to unspecified occlusion or stenosis of unspecified cerebral artery: Secondary | ICD-10-CM | POA: Diagnosis not present

## 2019-08-30 ENCOUNTER — Other Ambulatory Visit: Payer: Self-pay | Admitting: Physician Assistant

## 2019-09-02 ENCOUNTER — Other Ambulatory Visit: Payer: Self-pay | Admitting: Physician Assistant

## 2019-09-02 MED ORDER — ATORVASTATIN CALCIUM 40 MG PO TABS: 40.0000 mg | ORAL_TABLET | Freq: Every day | ORAL | 0 refills | Status: DC

## 2019-10-21 ENCOUNTER — Ambulatory Visit (INDEPENDENT_AMBULATORY_CARE_PROVIDER_SITE_OTHER): Payer: BC Managed Care – PPO | Admitting: Physician Assistant

## 2019-10-21 ENCOUNTER — Other Ambulatory Visit: Payer: Self-pay

## 2019-10-21 ENCOUNTER — Encounter: Payer: Self-pay | Admitting: Physician Assistant

## 2019-10-21 VITALS — BP 122/84 | HR 78 | Temp 98.0°F | Resp 16 | Wt 231.0 lb

## 2019-10-21 DIAGNOSIS — E785 Hyperlipidemia, unspecified: Secondary | ICD-10-CM | POA: Diagnosis not present

## 2019-10-21 DIAGNOSIS — I10 Essential (primary) hypertension: Secondary | ICD-10-CM

## 2019-10-21 DIAGNOSIS — I69390 Apraxia following cerebral infarction: Secondary | ICD-10-CM

## 2019-10-21 DIAGNOSIS — I69351 Hemiplegia and hemiparesis following cerebral infarction affecting right dominant side: Secondary | ICD-10-CM | POA: Diagnosis not present

## 2019-10-21 NOTE — Assessment & Plan Note (Signed)
Great progression - speech becoming more clear

## 2019-10-21 NOTE — Assessment & Plan Note (Signed)
Continue therapy/botox

## 2019-10-21 NOTE — Progress Notes (Signed)
Established Patient Office Visit  Subjective:  Patient ID: Mario [name], male    DOB: [birth year]  Age: 44 y.o. MRN: [id number]  CC:  Chief Complaint  Patient presents with  . Hypertension  . Hyperlipidemia    HPI Mario [name] presents for follow up hypertension  Pt presents for follow up of hypertension.  He is tolerating the medication well without side effects.  Compliance with treatment has been good; he takes his medication as directed, maintains his diet and exercise regimen, and follows up as directed.  Currently on hyzaar 100/12.5mg  qd    Follow up of aphasia following unspecified cerebrovascular disease.  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  pt is stable after having CVA few years ago -- still receiving botox injections in large muscle groups on his right side which has improved his strength greatly - he goes every 3 months    Pt presents with hyperlipidemia.  Compliance with treatment has been good; he takes his medication as directed, maintains his low cholesterol diet, follows up as directed, and maintains his exercise regimen.  He denies experiencing any hypercholesterolemia related symptoms. He is currently on lipitor 40mg  qd  Past Medical History:  Diagnosis Date  . Hypertension   . Stroke [facility])     Past Surgical History:  Procedure Laterality Date  . NO PAST SURGERIES      Family History  Problem Relation Age of Onset  . Stroke Maternal Grandmother     Social History   Socioeconomic History  . Marital status: Married    Spouse name: Not on file  . Number of children: Not on file  . Years of education: Not on file  . Highest education level: Not on file  Occupational History  . Not on file  Tobacco Use  . Smoking status: Never Smoker  . Smokeless tobacco: Never Used  Substance and Sexual Activity  .  Alcohol use: No  . Drug use: No  . Sexual activity: Not on file  Other Topics Concern  . Not on file  Social History Narrative  . Not on file   Social Determinants of Health   Financial Resource Strain:   . Difficulty of Paying Living Expenses:   Food Insecurity:   . Worried About [facility] in the Last Year:   . Programme researcher, broadcasting/film/video in the Last Year:   Transportation Needs:   . Barista (Medical):   Freight forwarder Lack of Transportation (Non-Medical):   Physical Activity:   . Days of Exercise per Week:   . Minutes of Exercise per Session:   Stress:   . Feeling of Stress :   Social Connections:   . Frequency of Communication with Friends and Family:   . Frequency of Social Gatherings with Friends and Family:   . Attends Religious Services:   . Active Member of Clubs or Organizations:   . Attends [employer] Meetings:   Banker Marital Status:   Intimate Partner Violence:   . Fear of Current or Ex-Partner:   . Emotionally Abused:   [place] Physically Abused:   . Sexually Abused:      Current Outpatient Medications:  .  aspirin 325 MG tablet, Take 1 tablet (325 mg total) by mouth daily., Disp: , Rfl:  .  atorvastatin (LIPITOR) 40 MG tablet, Take 1  tablet (40 mg total) by mouth daily., Disp: 90 tablet, Rfl: 0 .  loratadine (CLARITIN) 10 MG tablet, TK 1 T PO QD FOR ALLERGIES, Disp: , Rfl: 5 .  losartan-hydrochlorothiazide (HYZAAR) 100-12.5 MG tablet, TAKE 1 TABLET BY MOUTH DAILY, Disp: 90 tablet, Rfl: 0 .  Multiple Vitamins-Minerals (MULTIVITAMIN ADULT) TABS, Take 1 tablet by mouth daily., Disp: , Rfl:  .  Omega 3 1000 MG CAPS, Take 0.1 capsules (100 mg total) by mouth daily., Disp: 90 each, Rfl: 1   No Known Allergies  ROS CONSTITUTIONAL: Negative for chills, fatigue, fever, unintentional weight gain and unintentional weight loss.  E/N/T: Negative for ear pain, nasal congestion and sore throat.  CARDIOVASCULAR: Negative for chest pain, dizziness, palpitations and  pedal edema.  RESPIRATORY: Negative for recent cough and dyspnea.  GASTROINTESTINAL: Negative for abdominal pain, acid reflux symptoms, constipation, diarrhea, nausea and vomiting.  MSK: see HPI  INTEGUMENTARY: Negative for rash.  NEUROLOGICAL: Negative for dizziness and headaches.  PSYCHIATRIC: Negative for sleep disturbance and to question depression screen.  Negative for depression, negative for anhedonia.        Objective:    PHYSICAL EXAM:   VS: BP 122/84   Pulse 78   Temp 98 F (36.7 C)   Resp 16   Wt 231 lb (104.8 kg)   SpO2 97%   BMI 31.33 kg/m   GEN: Well nourished, well developed, in no acute distress  Cardiac: RRR; no murmurs, rubs, or gallops,no edema - no significant varicosities Respiratory:  normal respiratory rate and pattern with no distress - normal breath  MS: good strength in right upper extremity - walking without assistance today and gait improving Skin: warm and dry, no rash  Neuro:  Alert and Oriented x 3, Strength and sensation are intact - CN II-Xii grossly intact Psych: euthymic mood, appropriate affect and demeanor  BP 122/84   Pulse 78   Temp 98 F (36.7 C)   Resp 16   Wt 231 lb (104.8 kg)   SpO2 97%   BMI 31.33 kg/m  Wt Readings from Last 3 Encounters:  10/21/19 231 lb (104.8 kg)  09/10/18 215 lb (97.5 kg)  07/23/18 212 lb 3.2 oz (96.3 kg)     Health Maintenance Due  Topic Date Due  . COVID-19 Vaccine (1) Never done  . TETANUS/TDAP  Never done    There are no preventive care reminders to display for this patient.  Lab Results  Component Value Date   TSH 1.020 11/16/2015   Lab Results  Component Value Date   WBC 4.9 11/16/2015   HGB 15.3 11/16/2015   HCT 45.3 11/16/2015   MCV 94 11/16/2015   PLT 189 11/16/2015   Lab Results  Component Value Date   NA 143 11/16/2015   K 4.1 11/16/2015   CO2 26 11/16/2015   GLUCOSE 82 11/16/2015   BUN 14 11/16/2015   CREATININE 1.27 11/16/2015   BILITOT 0.5 11/16/2015   ALKPHOS 49  11/16/2015   AST 36 11/16/2015   ALT 31 11/16/2015   PROT 7.7 11/16/2015   ALBUMIN 4.4 11/16/2015   CALCIUM 10.0 11/16/2015   ANIONGAP 13 11/02/2014   Lab Results  Component Value Date   CHOL 249 (H) 11/16/2015   Lab Results  Component Value Date   HDL 56 11/16/2015   Lab Results  Component Value Date   LDLCALC 166 (H) 11/16/2015   Lab Results  Component Value Date   TRIG 134 11/16/2015   Lab Results  Component Value Date   CHOLHDL 4.4 11/16/2015   Lab Results  Component Value Date   HGBA1C 5.2 11/16/2015      Assessment & Plan:   Problem List Items Addressed This Visit      Cardiovascular and Mediastinum   Essential hypertension - Primary    Well controlled.  No changes to medicines.  Continue to work on eating a healthy diet and exercise.  Labs drawn today.        Relevant Orders   CBC with Differential/Platelet   Comprehensive metabolic panel     Nervous and Auditory   Spastic hemiplegia affecting right dominant side (HCC)    Continue therapy/botox        Musculoskeletal and Integument   Apraxia following CVA (cerebrovascular accident)    Great progression - speech becoming more clear        Other   Dyslipidemia    Well controlled.  No changes to medicines.  Continue to work on eating a healthy diet and exercise.  Labs drawn today.        Relevant Orders   Lipid panel      No orders of the defined types were placed in this encounter.   Follow-up: Return in about 6 months (around 04/22/2020) for chronic fasting follow up.    SARA R DAVIS, PA-C

## 2019-10-21 NOTE — Assessment & Plan Note (Signed)
Well controlled.  ?No changes to medicines.  ?Continue to work on eating a healthy diet and exercise.  ?Labs drawn today.  ?

## 2019-10-22 ENCOUNTER — Other Ambulatory Visit: Payer: Self-pay | Admitting: Physician Assistant

## 2019-10-22 DIAGNOSIS — R799 Abnormal finding of blood chemistry, unspecified: Secondary | ICD-10-CM

## 2019-10-22 LAB — COMPREHENSIVE METABOLIC PANEL
ALT: 32 IU/L (ref 0–44)
AST: 37 IU/L (ref 0–40)
Albumin/Globulin Ratio: 1.5 (ref 1.2–2.2)
Albumin: 4.4 g/dL (ref 4.0–5.0)
Alkaline Phosphatase: 65 IU/L (ref 48–121)
BUN/Creatinine Ratio: 8 — ABNORMAL LOW (ref 9–20)
BUN: 11 mg/dL (ref 6–24)
Bilirubin Total: 0.9 mg/dL (ref 0.0–1.2)
CO2: 25 mmol/L (ref 20–29)
Calcium: 9.9 mg/dL (ref 8.7–10.2)
Chloride: 102 mmol/L (ref 96–106)
Creatinine, Ser: 1.44 mg/dL — ABNORMAL HIGH (ref 0.76–1.27)
GFR calc Af Amer: 67 mL/min/{1.73_m2} (ref >59)
GFR calc non Af Amer: 58 mL/min/{1.73_m2} — ABNORMAL LOW (ref >59)
Globulin, Total: 3 g/dL (ref 1.5–4.5)
Glucose: 88 mg/dL (ref 65–99)
Potassium: 4.5 mmol/L (ref 3.5–5.2)
Sodium: 141 mmol/L (ref 134–144)
Total Protein: 7.4 g/dL (ref 6.0–8.5)

## 2019-10-22 LAB — LIPID PANEL
Chol/HDL Ratio: 3.2 ratio (ref 0.0–5.0)
Cholesterol, Total: 162 mg/dL (ref 100–199)
HDL: 51 mg/dL (ref >39)
LDL Chol Calc (NIH): 96 mg/dL (ref 0–99)
Triglycerides: 78 mg/dL (ref 0–149)
VLDL Cholesterol Cal: 15 mg/dL (ref 5–40)

## 2019-10-22 LAB — CBC WITH DIFFERENTIAL/PLATELET
Basophils Absolute: 0 10*3/uL (ref 0.0–0.2)
Basos: 1 %
EOS (ABSOLUTE): 0.2 10*3/uL (ref 0.0–0.4)
Eos: 4 %
Hematocrit: 46.4 % (ref 37.5–51.0)
Hemoglobin: 15.8 g/dL (ref 13.0–17.7)
Immature Grans (Abs): 0 10*3/uL (ref 0.0–0.1)
Immature Granulocytes: 0 %
Lymphocytes Absolute: 2 10*3/uL (ref 0.7–3.1)
Lymphs: 36 %
MCH: 32.1 pg (ref 26.6–33.0)
MCHC: 34.1 g/dL (ref 31.5–35.7)
MCV: 94 fL (ref 79–97)
Monocytes Absolute: 0.4 10*3/uL (ref 0.1–0.9)
Monocytes: 7 %
Neutrophils Absolute: 2.9 10*3/uL (ref 1.4–7.0)
Neutrophils: 52 %
Platelets: 188 10*3/uL (ref 150–450)
RBC: 4.92 x10E6/uL (ref 4.14–5.80)
RDW: 13.9 % (ref 11.6–15.4)
WBC: 5.4 10*3/uL (ref 3.4–10.8)

## 2019-10-22 LAB — CARDIOVASCULAR RISK ASSESSMENT

## 2019-12-05 ENCOUNTER — Other Ambulatory Visit: Payer: Self-pay | Admitting: Physician Assistant

## 2019-12-06 ENCOUNTER — Other Ambulatory Visit: Payer: Self-pay | Admitting: Physician Assistant

## 2020-03-10 ENCOUNTER — Other Ambulatory Visit: Payer: Self-pay | Admitting: Physician Assistant

## 2020-04-22 ENCOUNTER — Ambulatory Visit: Payer: Medicare Other | Admitting: Physician Assistant

## 2020-05-06 ENCOUNTER — Ambulatory Visit: Payer: Medicare Other | Admitting: Physical Medicine & Rehabilitation

## 2020-05-11 ENCOUNTER — Encounter: Payer: Medicare Other | Admitting: Physical Medicine & Rehabilitation

## 2020-05-11 ENCOUNTER — Other Ambulatory Visit: Payer: Self-pay

## 2020-05-11 ENCOUNTER — Encounter: Payer: Self-pay | Admitting: Physical Medicine & Rehabilitation

## 2020-05-11 ENCOUNTER — Encounter: Payer: Medicare Other | Attending: Physical Medicine & Rehabilitation | Admitting: Physical Medicine & Rehabilitation

## 2020-05-11 VITALS — BP 152/93 | HR 73 | Temp 98.3°F | Ht 72.0 in | Wt 227.4 lb

## 2020-05-11 DIAGNOSIS — G8111 Spastic hemiplegia affecting right dominant side: Secondary | ICD-10-CM | POA: Diagnosis not present

## 2020-05-11 NOTE — Progress Notes (Signed)
Subjective:    Patient ID: Mario [name], male    DOB: 1976/01/14, 44 y.o.   MRN: [id number] 44 year old right-handed male with history of hypertension, independent prior to admission, living with his wife, works as a Airline pilot.  He presented to [facility] on Oct 19, 2014, with acute onset of right-sided weakness, headache, and inability to speak.  Cranial CT scan negative.  MRI of the head showed moderate volume of patchy acute infarct, left MCA territory. Most confluent involvement at the corona radiata.  No mass effect. Carotid Dopplers with no ICA stenosis.  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  Collateral supply of left MCA branch vessels decreased in number and caliber in the area of acute infarction.  Echocardiogram with ejection fraction of 65% without emboli.  The patient did not receive t- PA.  Placed on aspirin for CVA prophylaxis.  Subcutaneous Lovenox for DVT prophylaxis. HPI Patient returns to clinic after a 55-month absence.  He states he has had no intercurrent illnesses.  The patient remains independent with all self-care and mobility.  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  He is returning today with primary complaint of right foot turning inward.  Patient has had this problem since his stroke but was getting botulinum toxin injections on a regular basis to help with this.  His last injection was greater than 1.5 years ago.  We also discussed other abnormal foot movements.  After further questioning the patient also admits to toe curling.  He has a bump on the top of his big toe as well.  He mainly wears closed toe shoes.  Pain Inventory Average Pain 10 Pain Right Now 10 My pain is constant, sharp, burning, dull, stabbing, tingling and aching  LOCATION OF PAIN  On the right side of the body  BOWEL Number of stools per week: 7 Oral laxative use No  Type of laxative None Enema or suppository use No  History of  colostomy No  Incontinent No   BLADDER Normal In and out cath, frequency N/A Able to self cath No  Bladder incontinence No  Frequent urination No  Leakage with coughing No  Difficulty starting stream No  Incomplete bladder emptying No    Mobility how many minutes can you walk? No problem with walking ability to climb steps?  yes do you drive?  yes  Function disabled: date disabled 2016  Neuro/Psych numbness tingling dizziness  Prior Studies Any changes since last visit?  no  Physicians involved in your care Any changes since last visit?  no   Family History  Problem Relation Age of Onset  . Stroke Maternal Grandmother    Social History   Socioeconomic History  . Marital status: Married    Spouse name: Not on file  . Number of children: Not on file  . Years of education: Not on file  . Highest education level: Not on file  Occupational History  . Not on file  Tobacco Use  . Smoking status: Never Smoker  . Smokeless tobacco: Never Used  Substance and Sexual Activity  . Alcohol use: No  . Drug use: No  . Sexual activity: Not on file  Other Topics Concern  . Not on file  Social History Narrative  . Not on file   Social Determinants of Health   Financial Resource Strain:   . Difficulty of Paying Living Expenses: Not on file  Food Insecurity:   . Worried About  Running Out of Food in the Last Year: Not on file  . Ran Out of Food in the Last Year: Not on file  Transportation Needs:   . Lack of Transportation (Medical): Not on file  . Lack of Transportation (Non-Medical): Not on file  Physical Activity:   . Days of Exercise per Week: Not on file  . Minutes of Exercise per Session: Not on file  Stress:   . Feeling of Stress : Not on file  Social Connections:   . Frequency of Communication with Friends and Family: Not on file  . Frequency of Social Gatherings with Friends and Family: Not on file  . Attends Religious Services: Not on file  . Active  Member of Clubs or Organizations: Not on file  . Attends Banker Meetings: Not on file  . Marital Status: Not on file   Past Surgical History:  Procedure Laterality Date  . NO PAST SURGERIES     Past Medical History:  Diagnosis Date  . Hypertension   . Stroke [facility])    There were no vitals taken for this visit.  Opioid Risk Score:   Fall Risk Score:  `1  Depression screen PHQ 2/9  Depression screen [facility] 2/9 03/12/2018 01/01/2017 04/13/2015  Decreased Interest 0 3 0  Down, Depressed, Hopeless 0 3 1  PHQ - 2 Score 0 6 1  Altered sleeping - - 0  Tired, decreased energy - - 0  Change in appetite - - 1  Feeling bad or failure about yourself  - - 1  Trouble concentrating - - 1  Moving slowly or fidgety/restless - - 1  Suicidal thoughts - - 0  PHQ-9 Score - - 5   Review of Systems  Musculoskeletal:       Spasms Right side leg pain & right side foot pain Right side arm and shoulder pain.  Total Right sided pain   Neurological: Positive for headaches. Negative for weakness.       Right side headache  All other systems reviewed and are negative.      Objective:   Physical Exam Vitals and nursing note reviewed.  Constitutional:      Appearance: Normal appearance.  HENT:     Head: Normocephalic and atraumatic.  Eyes:     Extraocular Movements: Extraocular movements intact.     Conjunctiva/sclera: Conjunctivae normal.     Pupils: Pupils are equal, round, and reactive to light.  Musculoskeletal:        General: Deformity present. No tenderness.     Comments: Equina varus deformity right foot  Skin:    General: Skin is warm and dry.  Neurological:     Mental Status: He is alert.     Coordination: Coordination abnormal. Impaired rapid alternating movements.     Comments: Motor strength is 4 - at the right deltoid, bicep, tricep, grip, hip flexor 5/5 knee extensor trace ankle dorsiflexor 2 minus plantar flexor  Remains expressively aphasic, unable to assess  sensation  Ambulates without assistive device he does have inversion of the right foot and walks on the outer border of his right foot.  Shoe wear and ankle brace were removed.  With standing and ambulation there is also evidence of right great toe as well as second through fifth toe flexion           Assessment & Plan:  #1.  Spasticity affecting ambulation as well as shoewear issues right lower extremity status post left MCA distribution infarct approximately  5 years ago.  We discussed treatment options.  Given the focal nature of spasticity would recommend injection therapy.  We discussed both tibial nerve block with phenol as well as botulinum toxin injection.  Given his previous good success with botulinum toxin we will schedule for Botox 200 units to the following muscles Right tibialis posterior 75 units Right flexor digitorum longus 75 units Right flexor hallucis longus 50 units discussed with patient agrees with plan.

## 2020-05-11 NOTE — Patient Instructions (Signed)
Plan botox injection to th eRight big toe flexor, small toe flexors and foot inverter muscles

## 2020-06-01 ENCOUNTER — Encounter (HOSPITAL_BASED_OUTPATIENT_CLINIC_OR_DEPARTMENT_OTHER): Payer: Medicare Other | Admitting: Physical Medicine & Rehabilitation

## 2020-06-01 ENCOUNTER — Encounter: Payer: Self-pay | Admitting: Physical Medicine & Rehabilitation

## 2020-06-01 ENCOUNTER — Other Ambulatory Visit: Payer: Self-pay

## 2020-06-01 VITALS — BP 150/99 | HR 71 | Temp 98.4°F | Ht 72.0 in | Wt 228.4 lb

## 2020-06-01 DIAGNOSIS — G8111 Spastic hemiplegia affecting right dominant side: Secondary | ICD-10-CM | POA: Diagnosis not present

## 2020-06-01 NOTE — Patient Instructions (Signed)

## 2020-06-01 NOTE — Progress Notes (Signed)
Botox Injection for spasticity using needle EMG guidance  Dilution: 50 Units/ml Indication: Severe spasticity which interferes with ADL,mobility and/or  hygiene and is unresponsive to medication management and other conservative care Informed consent was obtained after describing risks and benefits of the procedure with the patient. This includes bleeding, bruising, infection, excessive weakness, or medication side effects. A REMS form is on file and signed. Needle: 25g 2" needle electrode Number of units per muscle Right lower ext FDL 75U Post tib 75U EHL 50U Gastroc 25U  All injections were done after obtaining appropriate EMG activity and after negative drawback for blood. The patient tolerated the procedure well. Post procedure instructions were given. A followup appointment was made.  

## 2020-06-08 ENCOUNTER — Encounter: Payer: Self-pay | Admitting: Physician Assistant

## 2020-06-08 ENCOUNTER — Other Ambulatory Visit: Payer: Self-pay

## 2020-06-08 ENCOUNTER — Ambulatory Visit (INDEPENDENT_AMBULATORY_CARE_PROVIDER_SITE_OTHER): Payer: BC Managed Care – PPO | Admitting: Physician Assistant

## 2020-06-08 VITALS — BP 136/98 | HR 76 | Temp 96.0°F | Wt 226.0 lb

## 2020-06-08 DIAGNOSIS — I10 Essential (primary) hypertension: Secondary | ICD-10-CM | POA: Diagnosis not present

## 2020-06-08 DIAGNOSIS — E785 Hyperlipidemia, unspecified: Secondary | ICD-10-CM

## 2020-06-08 MED ORDER — LOSARTAN POTASSIUM-HCTZ 100-12.5 MG PO TABS: 1.0000 | ORAL_TABLET | Freq: Every day | ORAL | 0 refills | Status: DC

## 2020-06-08 MED ORDER — ATORVASTATIN CALCIUM 40 MG PO TABS: ORAL_TABLET | ORAL | 1 refills | Status: DC

## 2020-06-08 MED ORDER — AMLODIPINE BESYLATE 5 MG PO TABS: 5.0000 mg | ORAL_TABLET | Freq: Every day | ORAL | 1 refills | Status: DC

## 2020-06-08 NOTE — Progress Notes (Signed)
Established Patient Office Visit  Subjective:  Patient ID: Mario [name], male    DOB: [birth year]  Age: 45 y.o. MRN: [id number]  CC:  Chief Complaint  Patient presents with  . Hyperlipidemia  . Hypertension    HPI [name] presents for follow up hypertension  Pt presents for follow up of hypertension.  He is tolerating the medication well without side effects.  Compliance with treatment has been good; he takes his medication as directed, maintains his diet and exercise regimen, and follows up as directed.  Currently on hyzaar 100/12.5mg  qd - it is noted that his bp has been elevated at his last 2 visits with another provider and elevated here today    Follow up of aphasia following unspecified cerebrovascular disease.  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  pt is stable after having CVA few years ago -- still receiving botox injections in large muscle groups on his right side which has improved his strength greatly - he goes every 3 months    Pt presents with hyperlipidemia.  Compliance with treatment has been good; he takes his medication as directed, maintains his low cholesterol diet, follows up as directed, and maintains his exercise regimen.  He denies experiencing any hypercholesterolemia related symptoms. He is currently on lipitor 40mg  qd  Past Medical History:  Diagnosis Date  . Hypertension   . Stroke [facility])     Past Surgical History:  Procedure Laterality Date  . NO PAST SURGERIES      Family History  Problem Relation Age of Onset  . Stroke Maternal Grandmother     Social History   Socioeconomic History  . Marital status: Married    Spouse name: Not on file  . Number of children: Not on file  . Years of education: Not on file  . Highest education level: Not on file  Occupational History  . Not on file  Tobacco  Use  . Smoking status: Never Smoker  . Smokeless tobacco: Never Used  Vaping Use  . Vaping Use: Never used  Substance and Sexual Activity  . Alcohol use: No  . Drug use: No  . Sexual activity: Not on file  Other Topics Concern  . Not on file  Social History Narrative  . Not on file   Social Determinants of Health   Financial Resource Strain: Not on file  Food Insecurity: Not on file  Transportation Needs: Not on file  Physical Activity: Not on file  Stress: Not on file  Social Connections: Not on file  Intimate Partner Violence: Not on file     Current Outpatient Medications:  .  amLODipine (NORVASC) 5 MG tablet, Take 1 tablet (5 mg total) by mouth daily., Disp: 90 tablet, Rfl: 1 .  aspirin 325 MG tablet, Take 1 tablet (325 mg total) by mouth daily., Disp: , Rfl:  .  atorvastatin (LIPITOR) 40 MG tablet, TAKE 1 TABLET(40 MG) BY MOUTH DAILY, Disp: 90 tablet, Rfl: 1 .  loratadine (CLARITIN) 10 MG tablet, TK 1 T PO QD FOR ALLERGIES, Disp: , Rfl: 5 .  losartan-hydrochlorothiazide (HYZAAR) 100-12.5 MG tablet, Take 1 tablet by mouth daily., Disp: 90 tablet, Rfl: 0 .  Multiple Vitamins-Minerals (MULTIVITAMIN ADULT) TABS, Take 1 tablet by mouth daily., Disp: , Rfl:  .  Omega 3 1000 MG CAPS, Take 0.1 capsules (100 mg  total) by mouth daily., Disp: 90 each, Rfl: 1   No Known Allergies  ROS CONSTITUTIONAL: Negative for chills, fatigue, fever, unintentional weight gain and unintentional weight loss.   CARDIOVASCULAR: Negative for chest pain, dizziness, palpitations and pedal edema.  RESPIRATORY: Negative for recent cough and dyspnea.  MSK: Negative for arthralgias and myalgias.  INTEGUMENTARY: Negative for rash.  NEUROLOGICAL: Negative for dizziness and headaches.  PSYCHIATRIC: Negative for sleep disturbance and to question depression screen.  Negative for depression, negative for anhedonia.         Objective:    PHYSICAL EXAM:   VS: BP (!) 136/98   Pulse 76   Temp (!) 96 F  (35.6 C)   Wt 226 lb (102.5 kg)   SpO2 100%   BMI 30.65 kg/m   PHYSICAL EXAM:   VS: BP (!) 136/98   Pulse 76   Temp (!) 96 F (35.6 C)   Wt 226 lb (102.5 kg)   SpO2 100%   BMI 30.65 kg/m   GEN: Well nourished, well developed, in no acute distress  Cardiac: RRR; no murmurs, rubs, or gallops,no edema -  Respiratory:  normal respiratory rate and pattern with no distress - normal breath sounds with no rales, rhonchi, wheezes or rubs  Skin: warm and dry, no rash  Neuro:  Alert and Oriented x 3,-  Psych: euthymic mood, appropriate affect and demeanor   BP (!) 136/98   Pulse 76   Temp (!) 96 F (35.6 C)   Wt 226 lb (102.5 kg)   SpO2 100%   BMI 30.65 kg/m  Wt Readings from Last 3 Encounters:  06/08/20 226 lb (102.5 kg)  06/01/20 228 lb 6.4 oz (103.6 kg)  05/11/20 227 lb 6.4 oz (103.1 kg)     Health Maintenance Due  Topic Date Due  . Hepatitis C Screening  Never done  . COVID-19 Vaccine (1) Never done  . TETANUS/TDAP  Never done  . COLONOSCOPY (Pts 45-36yrs Insurance coverage will need to be confirmed)  Never done  . INFLUENZA VACCINE  01/04/2020    There are no preventive care reminders to display for this patient.  Lab Results  Component Value Date   TSH 1.020 11/16/2015   Lab Results  Component Value Date   WBC 5.4 10/21/2019   HGB 15.8 10/21/2019   HCT 46.4 10/21/2019   MCV 94 10/21/2019   PLT 188 10/21/2019   Lab Results  Component Value Date   NA 141 10/21/2019   K 4.5 10/21/2019   CO2 25 10/21/2019   GLUCOSE 88 10/21/2019   BUN 11 10/21/2019   CREATININE 1.44 (H) 10/21/2019   BILITOT 0.9 10/21/2019   ALKPHOS 65 10/21/2019   AST 37 10/21/2019   ALT 32 10/21/2019   PROT 7.4 10/21/2019   ALBUMIN 4.4 10/21/2019   CALCIUM 9.9 10/21/2019   ANIONGAP 13 11/02/2014   Lab Results  Component Value Date   CHOL 162 10/21/2019   Lab Results  Component Value Date   HDL 51 10/21/2019   Lab Results  Component Value Date   LDLCALC 96 10/21/2019    Lab Results  Component Value Date   TRIG 78 10/21/2019   Lab Results  Component Value Date   CHOLHDL 3.2 10/21/2019   Lab Results  Component Value Date   HGBA1C 5.2 11/16/2015      Assessment & Plan:   Problem List Items Addressed This Visit      Cardiovascular and Mediastinum   Essential hypertension -  Primary   Relevant Medications   atorvastatin (LIPITOR) 40 MG tablet   losartan-hydrochlorothiazide (HYZAAR) 100-12.5 MG tablet   amLODipine (NORVASC) 5 MG tablet   Other Relevant Orders   CBC with Differential/Platelet   Comprehensive metabolic panel     Other   Dyslipidemia   Relevant Medications   atorvastatin (LIPITOR) 40 MG tablet   Other Relevant Orders   Lipid panel      Meds ordered this encounter  Medications  . atorvastatin (LIPITOR) 40 MG tablet    Sig: TAKE 1 TABLET(40 MG) BY MOUTH DAILY    Dispense:  90 tablet    Refill:  1  . losartan-hydrochlorothiazide (HYZAAR) 100-12.5 MG tablet    Sig: Take 1 tablet by mouth daily.    Dispense:  90 tablet    Refill:  0  . amLODipine (NORVASC) 5 MG tablet    Sig: Take 1 tablet (5 mg total) by mouth daily.    Dispense:  90 tablet    Refill:  1    Order Specific Question:   Supervising Provider    Answer:   Corey Harold    Follow-up: Return in about 6 months (around 12/06/2020) for and in 3 weeks for nurse bp check.    SARA R Racine Erby, PA-C

## 2020-06-09 ENCOUNTER — Other Ambulatory Visit: Payer: Self-pay | Admitting: Physician Assistant

## 2020-06-09 DIAGNOSIS — I10 Essential (primary) hypertension: Secondary | ICD-10-CM

## 2020-06-09 LAB — CBC WITH DIFFERENTIAL/PLATELET
Basophils Absolute: 0 10*3/uL (ref 0.0–0.2)
Basos: 1 %
EOS (ABSOLUTE): 0.2 10*3/uL (ref 0.0–0.4)
Eos: 4 %
Hematocrit: 46.1 % (ref 37.5–51.0)
Hemoglobin: 15.9 g/dL (ref 13.0–17.7)
Immature Grans (Abs): 0 10*3/uL (ref 0.0–0.1)
Immature Granulocytes: 0 %
Lymphocytes Absolute: 1.9 10*3/uL (ref 0.7–3.1)
Lymphs: 32 %
MCH: 31.2 pg (ref 26.6–33.0)
MCHC: 34.5 g/dL (ref 31.5–35.7)
MCV: 91 fL (ref 79–97)
Monocytes Absolute: 0.4 10*3/uL (ref 0.1–0.9)
Monocytes: 6 %
Neutrophils Absolute: 3.4 10*3/uL (ref 1.4–7.0)
Neutrophils: 57 %
Platelets: 187 10*3/uL (ref 150–450)
RBC: 5.09 x10E6/uL (ref 4.14–5.80)
RDW: 13.4 % (ref 11.6–15.4)
WBC: 6 10*3/uL (ref 3.4–10.8)

## 2020-06-09 LAB — COMPREHENSIVE METABOLIC PANEL
ALT: 40 IU/L (ref 0–44)
AST: 51 IU/L — ABNORMAL HIGH (ref 0–40)
Albumin/Globulin Ratio: 1.4 (ref 1.2–2.2)
Albumin: 4.4 g/dL (ref 4.0–5.0)
Alkaline Phosphatase: 69 IU/L (ref 44–121)
BUN/Creatinine Ratio: 10 (ref 9–20)
BUN: 18 mg/dL (ref 6–24)
Bilirubin Total: 0.8 mg/dL (ref 0.0–1.2)
CO2: 25 mmol/L (ref 20–29)
Calcium: 9.5 mg/dL (ref 8.7–10.2)
Chloride: 101 mmol/L (ref 96–106)
Creatinine, Ser: 1.82 mg/dL — ABNORMAL HIGH (ref 0.76–1.27)
GFR calc Af Amer: 51 mL/min/{1.73_m2} — ABNORMAL LOW (ref >59)
GFR calc non Af Amer: 44 mL/min/{1.73_m2} — ABNORMAL LOW (ref >59)
Globulin, Total: 3.2 g/dL (ref 1.5–4.5)
Glucose: 99 mg/dL (ref 65–99)
Potassium: 4.1 mmol/L (ref 3.5–5.2)
Sodium: 139 mmol/L (ref 134–144)
Total Protein: 7.6 g/dL (ref 6.0–8.5)

## 2020-06-09 LAB — LIPID PANEL
Chol/HDL Ratio: 3.5 ratio (ref 0.0–5.0)
Cholesterol, Total: 171 mg/dL (ref 100–199)
HDL: 49 mg/dL (ref >39)
LDL Chol Calc (NIH): 106 mg/dL — ABNORMAL HIGH (ref 0–99)
Triglycerides: 86 mg/dL (ref 0–149)
VLDL Cholesterol Cal: 16 mg/dL (ref 5–40)

## 2020-06-09 LAB — CARDIOVASCULAR RISK ASSESSMENT

## 2020-06-29 ENCOUNTER — Other Ambulatory Visit: Payer: Self-pay

## 2020-06-29 ENCOUNTER — Ambulatory Visit (INDEPENDENT_AMBULATORY_CARE_PROVIDER_SITE_OTHER): Payer: Medicare Other

## 2020-06-29 VITALS — BP 138/82

## 2020-06-29 DIAGNOSIS — I10 Essential (primary) hypertension: Secondary | ICD-10-CM

## 2020-06-29 NOTE — Progress Notes (Signed)
Patient came in today for BP check.  In the office it was 138/82.  Patient states he is taking his medication as prescribed and denies any adverse effects.  Per Kennon Rounds, no medication changes, continue what he is doing and recheck BP in 6-8 weeks.

## 2020-07-08 ENCOUNTER — Encounter: Payer: BC Managed Care – PPO | Admitting: Physical Medicine & Rehabilitation

## 2020-07-16 ENCOUNTER — Ambulatory Visit: Payer: BC Managed Care – PPO

## 2020-07-20 ENCOUNTER — Other Ambulatory Visit: Payer: Self-pay

## 2020-07-20 ENCOUNTER — Ambulatory Visit (INDEPENDENT_AMBULATORY_CARE_PROVIDER_SITE_OTHER): Payer: Medicare Other

## 2020-07-20 VITALS — BP 130/90 | HR 81

## 2020-07-20 DIAGNOSIS — I10 Essential (primary) hypertension: Secondary | ICD-10-CM

## 2020-07-20 NOTE — Progress Notes (Signed)
Patient requested BP check today, BP was 130/90 and pulse was 81. I discussed this with Dr. Sedalia Muta and she recommended patient start taking 10 mg of Amlodipine daily and follow up in 2-3 weeks.

## 2020-07-21 LAB — COMPREHENSIVE METABOLIC PANEL
ALT: 41 IU/L (ref 0–44)
AST: 56 IU/L — ABNORMAL HIGH (ref 0–40)
Albumin/Globulin Ratio: 1.6 (ref 1.2–2.2)
Albumin: 4.5 g/dL (ref 4.0–5.0)
Alkaline Phosphatase: 69 IU/L (ref 44–121)
BUN/Creatinine Ratio: 10 (ref 9–20)
BUN: 15 mg/dL (ref 6–24)
Bilirubin Total: 0.7 mg/dL (ref 0.0–1.2)
CO2: 21 mmol/L (ref 20–29)
Calcium: 9.5 mg/dL (ref 8.7–10.2)
Chloride: 101 mmol/L (ref 96–106)
Creatinine, Ser: 1.54 mg/dL — ABNORMAL HIGH (ref 0.76–1.27)
GFR calc Af Amer: 62 mL/min/{1.73_m2} (ref >59)
GFR calc non Af Amer: 53 mL/min/{1.73_m2} — ABNORMAL LOW (ref >59)
Globulin, Total: 2.8 g/dL (ref 1.5–4.5)
Glucose: 88 mg/dL (ref 65–99)
Potassium: 4.1 mmol/L (ref 3.5–5.2)
Sodium: 140 mmol/L (ref 134–144)
Total Protein: 7.3 g/dL (ref 6.0–8.5)

## 2020-07-29 ENCOUNTER — Encounter: Payer: Self-pay | Admitting: Physical Medicine & Rehabilitation

## 2020-07-29 ENCOUNTER — Other Ambulatory Visit: Payer: Self-pay

## 2020-07-29 ENCOUNTER — Encounter
Payer: BC Managed Care – PPO | Attending: Physical Medicine & Rehabilitation | Admitting: Physical Medicine & Rehabilitation

## 2020-07-29 VITALS — BP 135/89 | HR 74 | Temp 99.0°F | Ht 72.0 in | Wt 227.0 lb

## 2020-07-29 DIAGNOSIS — G8111 Spastic hemiplegia affecting right dominant side: Secondary | ICD-10-CM | POA: Insufficient documentation

## 2020-07-29 NOTE — Patient Instructions (Signed)
Will make some minor adjustments to Botox dose

## 2020-07-29 NOTE — Progress Notes (Signed)
Subjective:    Patient ID: Mario [name], male    DOB: 22-Apr-1976, 45 y.o.   MRN: [id number]  HPI   Spasticity RLE post CVA 45 yo male with history of Left MCA infarct with spasticity , R HP and aphasia who returns to evaluate effects of botulinum toxin injection The patient is accompanied by his mother.  He remains aphasic but is able to express basic needs, accurate with yes/no questions has very good comprehension. His foot is turning in the last than prior to the injection.  He still has some toe hyperextension at times.  Botulinum toxin injection right lower extremity performed on 06/01/2020 FDL 75U Post tib 75U EHL 50U Gastroc 25U   Pain Inventory Average Pain 10 Pain Right Now 10 My pain is sharp  In the last 24 hours, has pain interfered with the following? General activity 10 Relation with others 10 Enjoyment of life 10 What TIME of day is your pain at its worst? morning , daytime, evening and night Sleep (in general) n/a  Pain is worse with: unsure Pain improves with: medication Relief from Meds: 10  Family History  Problem Relation Age of Onset  . Stroke Maternal Grandmother    Social History   Socioeconomic History  . Marital status: Married    Spouse name: Not on file  . Number of children: Not on file  . Years of education: Not on file  . Highest education level: Not on file  Occupational History  . Not on file  Tobacco Use  . Smoking status: Never Smoker  . Smokeless tobacco: Never Used  Vaping Use  . Vaping Use: Never used  Substance and Sexual Activity  . Alcohol use: No  . Drug use: No  . Sexual activity: Not on file  Other Topics Concern  . Not on file  Social History Narrative  . Not on file   Social Determinants of Health   Financial Resource Strain: Not on file  Food Insecurity: Not on file  Transportation Needs: Not on file  Physical Activity: Not on file  Stress: Not on file  Social Connections: Not on file   Past  Surgical History:  Procedure Laterality Date  . NO PAST SURGERIES     Past Surgical History:  Procedure Laterality Date  . NO PAST SURGERIES     Past Medical History:  Diagnosis Date  . Hypertension   . Stroke (HCC)    BP 135/89   Pulse 74   Temp 99 F (37.2 C)   Ht 6' (1.829 m)   Wt 227 lb (103 kg)   SpO2 97%   BMI 30.79 kg/m   Opioid Risk Score:   Fall Risk Score:  `1  Depression screen PHQ 2/9  Depression screen [facility] 2/9 05/11/2020 03/12/2018 01/01/2017 04/13/2015  Decreased Interest 0 0 3 0  Down, Depressed, Hopeless 0 0 3 1  PHQ - 2 Score 0 0 6 1  Altered sleeping - - - 0  Tired, decreased energy - - - 0  Change in appetite - - - 1  Feeling bad or failure about yourself  - - - 1  Trouble concentrating - - - 1  Moving slowly or fidgety/restless - - - 1  Suicidal thoughts - - - 0  PHQ-9 Score - - - 5    Review of Systems  Constitutional: Negative.   HENT: Negative.   Eyes: Negative.   Respiratory: Negative.   Cardiovascular: Negative.   Gastrointestinal: Negative.  Endocrine: Negative.   Genitourinary: Negative.   Musculoskeletal: Positive for gait problem.       Spasticity  Allergic/Immunologic: Negative.   Hematological: Negative.   Psychiatric/Behavioral: Negative.   All other systems reviewed and are negative.      Objective:   Physical Exam Vitals and nursing note reviewed.  Constitutional:      Appearance: Normal appearance.  Eyes:     Extraocular Movements: Extraocular movements intact.     Conjunctiva/sclera: Conjunctivae normal.     Pupils: Pupils are equal, round, and reactive to light.  Skin:    General: Skin is warm and dry.  Neurological:     Mental Status: He is alert.     Comments: Motor strength is 4+ at the right deltoid bicep tricep grip 4/5 at the right hip flexor 5/5 with knee extensor 4 - at the right ankle dorsiflexor. Tone MAS 2 spasticity at the gastroc MAS 3 at the foot inverters Hyperactive Babinski noted   Psychiatric:        Mood and Affect: Mood normal.            Assessment & Plan:  #1.  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  He is already completed physical therapy and continues with stretching his ankle and other home exercises. Will make minor adjustments to the treatment regimen with the botulinum toxin.  We will not inject the gastroc but instead put an extra 25 units into the posterior tibialis, will reduce EHL to 25 units. Planned tx FDL 75U Post tib 100U EHL 26O

## 2020-07-29 NOTE — Addendum Note (Signed)
Addended by: Tawny Asal I on: 07/29/2020 11:03 AM   Modules accepted: Level of Service

## 2020-09-07 ENCOUNTER — Ambulatory Visit: Payer: Medicare Other | Admitting: Physician Assistant

## 2020-09-11 ENCOUNTER — Other Ambulatory Visit: Payer: Self-pay | Admitting: Physician Assistant

## 2020-09-11 DIAGNOSIS — I10 Essential (primary) hypertension: Secondary | ICD-10-CM

## 2020-09-14 ENCOUNTER — Encounter: Payer: Self-pay | Admitting: Physical Medicine & Rehabilitation

## 2020-09-14 ENCOUNTER — Encounter: Payer: Medicare Other | Attending: Physical Medicine & Rehabilitation | Admitting: Physical Medicine & Rehabilitation

## 2020-09-14 ENCOUNTER — Other Ambulatory Visit: Payer: Self-pay

## 2020-09-14 VITALS — BP 128/90 | HR 73 | Temp 98.4°F | Ht 72.0 in | Wt 226.2 lb

## 2020-09-14 DIAGNOSIS — G8111 Spastic hemiplegia affecting right dominant side: Secondary | ICD-10-CM | POA: Diagnosis not present

## 2020-09-14 NOTE — Patient Instructions (Signed)

## 2020-09-14 NOTE — Progress Notes (Signed)
Botox Injection for spasticity using needle EMG guidance  Dilution: 50 Units/ml Indication: Severe spasticity which interferes with ADL,mobility and/or  hygiene and is unresponsive to medication management and other conservative care Informed consent was obtained after describing risks and benefits of the procedure with the patient. This includes bleeding, bruising, infection, excessive weakness, or medication side effects. A REMS form is on file and signed. Needle: 25g 2" needle electrode Number of units per muscle Right lower ext FDL 75U Post tib 75U EHL 50U Gastroc 25U  All injections were done after obtaining appropriate EMG activity and after negative drawback for blood. The patient tolerated the procedure well. Post procedure instructions were given. A followup appointment was made.

## 2020-09-16 ENCOUNTER — Other Ambulatory Visit: Payer: Self-pay

## 2020-09-16 ENCOUNTER — Ambulatory Visit (INDEPENDENT_AMBULATORY_CARE_PROVIDER_SITE_OTHER): Payer: BC Managed Care – PPO | Admitting: Physician Assistant

## 2020-09-16 ENCOUNTER — Encounter: Payer: Self-pay | Admitting: Physician Assistant

## 2020-09-16 DIAGNOSIS — I10 Essential (primary) hypertension: Secondary | ICD-10-CM | POA: Diagnosis not present

## 2020-09-16 DIAGNOSIS — E785 Hyperlipidemia, unspecified: Secondary | ICD-10-CM | POA: Diagnosis not present

## 2020-09-16 MED ORDER — LOSARTAN POTASSIUM-HCTZ 100-12.5 MG PO TABS: 1.0000 | ORAL_TABLET | Freq: Every day | ORAL | 1 refills | Status: DC

## 2020-09-16 MED ORDER — AMLODIPINE BESYLATE 5 MG PO TABS: 5.0000 mg | ORAL_TABLET | Freq: Every day | ORAL | 1 refills | Status: DC

## 2020-09-16 MED ORDER — ATORVASTATIN CALCIUM 40 MG PO TABS: ORAL_TABLET | ORAL | 1 refills | Status: DC

## 2020-09-16 NOTE — Progress Notes (Signed)
Established Patient Office Visit  Subjective:  Patient ID: Mario [name], male    DOB: 1975-10-10  Age: 45 y.o. MRN: [id number]  CC:  Chief Complaint  Patient presents with  . Hyperlipidemia  . Hypertension    HPI [name] presents for follow up hypertension  Pt presents for follow up of hypertension.  He is tolerating the medication well without side effects.  Compliance with treatment has been good; he takes his medication as directed, maintains his diet and exercise regimen, and follows up as directed.  Currently on hyzaar 100/12.5mg  qd - and amlodopine 5mg  qd    Follow up of aphasia following unspecified cerebrovascular disease.  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  pt is stable after having CVA few years ago -- still receiving botox injections in large muscle groups on his right side which has improved his strength greatly - he goes every 3 months    Pt presents with hyperlipidemia.  Compliance with treatment has been good; he takes his medication as directed, maintains his low cholesterol diet, follows up as directed, and maintains his exercise regimen.  He denies experiencing any hypercholesterolemia related symptoms. He is currently on lipitor 40mg  qd  Past Medical History:  Diagnosis Date  . Hypertension   . Stroke [facility])     Past Surgical History:  Procedure Laterality Date  . NO PAST SURGERIES      Family History  Problem Relation Age of Onset  . Stroke Maternal Grandmother     Social History   Socioeconomic History  . Marital status: Married    Spouse name: Not on file  . Number of children: Not on file  . Years of education: Not on file  . Highest education level: Not on file  Occupational History  . Not on file  Tobacco Use  . Smoking status: Never Smoker  . Smokeless tobacco: Never Used  Vaping Use  .  Vaping Use: Never used  Substance and Sexual Activity  . Alcohol use: No  . Drug use: No  . Sexual activity: Not on file  Other Topics Concern  . Not on file  Social History Narrative  . Not on file   Social Determinants of Health   Financial Resource Strain: Not on file  Food Insecurity: Not on file  Transportation Needs: Not on file  Physical Activity: Not on file  Stress: Not on file  Social Connections: Not on file  Intimate Partner Violence: Not on file     Current Outpatient Medications:  .  amLODipine (NORVASC) 5 MG tablet, Take 1 tablet (5 mg total) by mouth daily., Disp: 90 tablet, Rfl: 1 .  aspirin 325 MG tablet, Take 1 tablet (325 mg total) by mouth daily., Disp: , Rfl:  .  atorvastatin (LIPITOR) 40 MG tablet, TAKE 1 TABLET(40 MG) BY MOUTH DAILY, Disp: 90 tablet, Rfl: 1 .  loratadine (CLARITIN) 10 MG tablet, TK 1 T PO QD FOR ALLERGIES, Disp: , Rfl: 5 .  losartan-hydrochlorothiazide (HYZAAR) 100-12.5 MG tablet, Take 1 tablet by mouth daily., Disp: 90 tablet, Rfl: 1 .  Multiple Vitamins-Minerals (MULTIVITAMIN ADULT) TABS, Take 1 tablet by mouth daily., Disp: , Rfl:  .  Omega 3 1000 MG CAPS, Take 0.1 capsules (100 mg total) by mouth daily., Disp: 90 each, Rfl: 1   No Known Allergies  ROS CONSTITUTIONAL:  Negative for chills, fatigue, fever, unintentional weight gain and unintentional weight loss.  CARDIOVASCULAR: Negative for chest pain, dizziness, palpitations and pedal edema.  RESPIRATORY: Negative for recent cough and dyspnea.  GASTROINTESTINAL: Negative for abdominal pain, acid reflux symptoms, constipation, diarrhea, nausea and vomiting.  PSYCHIATRIC: Negative for sleep disturbance and to question depression screen.  Negative for depression, negative for anhedonia.        Objective:    PHYSICAL EXAM:   VS: BP 130/90   Pulse (!) 109   Temp 97.6 F (36.4 C)   Ht 6' (1.829 m)   Wt 225 lb (102.1 kg)   SpO2 96%   BMI 30.52 kg/m   PHYSICAL EXAM:   VS:  BP 130/90   Pulse (!) 109   Temp 97.6 F (36.4 C)   Ht 6' (1.829 m)   Wt 225 lb (102.1 kg)   SpO2 96%   BMI 30.52 kg/m   PHYSICAL EXAM:   VS: BP 130/90   Pulse (!) 109   Temp 97.6 F (36.4 C)   Ht 6' (1.829 m)   Wt 225 lb (102.1 kg)   SpO2 96%   BMI 30.52 kg/m   GEN: Well nourished, well developed, in no acute distress  Cardiac: RRR; no murmurs, rubs, or gallops,no edema - Respiratory:  normal respiratory rate and pattern with no distress - normal breath sounds with no rales, rhonchi, wheezes or rubs Psych: euthymic mood, appropriate affect and demeanor    BP 130/90   Pulse (!) 109   Temp 97.6 F (36.4 C)   Ht 6' (1.829 m)   Wt 225 lb (102.1 kg)   SpO2 96%   BMI 30.52 kg/m  Wt Readings from Last 3 Encounters:  09/16/20 225 lb (102.1 kg)  09/14/20 226 lb 3.2 oz (102.6 kg)  07/29/20 227 lb (103 kg)     Health Maintenance Due  Topic Date Due  . TETANUS/TDAP  Never done  . COLONOSCOPY (Pts 45-43yrs Insurance coverage will need to be confirmed)  Never done    There are no preventive care reminders to display for this patient.  Lab Results  Component Value Date   TSH 1.020 11/16/2015   Lab Results  Component Value Date   WBC 6.0 06/08/2020   HGB 15.9 06/08/2020   HCT 46.1 06/08/2020   MCV 91 06/08/2020   PLT 187 06/08/2020   Lab Results  Component Value Date   NA 140 07/20/2020   K 4.1 07/20/2020   CO2 21 07/20/2020   GLUCOSE 88 07/20/2020   BUN 15 07/20/2020   CREATININE 1.54 (H) 07/20/2020   BILITOT 0.7 07/20/2020   ALKPHOS 69 07/20/2020   AST 56 (H) 07/20/2020   ALT 41 07/20/2020   PROT 7.3 07/20/2020   ALBUMIN 4.5 07/20/2020   CALCIUM 9.5 07/20/2020   ANIONGAP 13 11/02/2014   Lab Results  Component Value Date   CHOL 171 06/08/2020   Lab Results  Component Value Date   HDL 49 06/08/2020   Lab Results  Component Value Date   LDLCALC 106 (H) 06/08/2020   Lab Results  Component Value Date   TRIG 86 06/08/2020   Lab Results   Component Value Date   CHOLHDL 3.5 06/08/2020   Lab Results  Component Value Date   HGBA1C 5.2 11/16/2015      Assessment & Plan:   Problem List Items Addressed This Visit      Cardiovascular and Mediastinum   Essential hypertension   Relevant Medications  amLODipine (NORVASC) 5 MG tablet   atorvastatin (LIPITOR) 40 MG tablet   losartan-hydrochlorothiazide (HYZAAR) 100-12.5 MG tablet     Other   Dyslipidemia   Relevant Medications   atorvastatin (LIPITOR) 40 MG tablet      Meds ordered this encounter  Medications  . amLODipine (NORVASC) 5 MG tablet    Sig: Take 1 tablet (5 mg total) by mouth daily.    Dispense:  90 tablet    Refill:  1  . atorvastatin (LIPITOR) 40 MG tablet    Sig: TAKE 1 TABLET(40 MG) BY MOUTH DAILY    Dispense:  90 tablet    Refill:  1  . losartan-hydrochlorothiazide (HYZAAR) 100-12.5 MG tablet    Sig: Take 1 tablet by mouth daily.    Dispense:  90 tablet    Refill:  1    Follow-up: Return in about 6 months (around 03/18/2021) for chronic fasting follow up.    SARA R Sibyl Mikula, PA-C

## 2020-09-16 NOTE — Addendum Note (Signed)
Addended by: Marianne Sofia on: 09/16/2020 10:40 AM   Modules accepted: Orders

## 2020-09-17 LAB — COMPREHENSIVE METABOLIC PANEL
ALT: 35 IU/L (ref 0–44)
AST: 38 IU/L (ref 0–40)
Albumin/Globulin Ratio: 1.6 (ref 1.2–2.2)
Albumin: 4.7 g/dL (ref 4.0–5.0)
Alkaline Phosphatase: 75 IU/L (ref 44–121)
BUN/Creatinine Ratio: 8 — ABNORMAL LOW (ref 9–20)
BUN: 14 mg/dL (ref 6–24)
Bilirubin Total: 0.8 mg/dL (ref 0.0–1.2)
CO2: 22 mmol/L (ref 20–29)
Calcium: 9.9 mg/dL (ref 8.7–10.2)
Chloride: 98 mmol/L (ref 96–106)
Creatinine, Ser: 1.67 mg/dL — ABNORMAL HIGH (ref 0.76–1.27)
Globulin, Total: 3 g/dL (ref 1.5–4.5)
Glucose: 116 mg/dL — ABNORMAL HIGH (ref 65–99)
Potassium: 4.3 mmol/L (ref 3.5–5.2)
Sodium: 137 mmol/L (ref 134–144)
Total Protein: 7.7 g/dL (ref 6.0–8.5)
eGFR: 51 mL/min/{1.73_m2} — ABNORMAL LOW (ref >59)

## 2020-09-17 LAB — LIPID PANEL
Chol/HDL Ratio: 3 ratio (ref 0.0–5.0)
Cholesterol, Total: 172 mg/dL (ref 100–199)
HDL: 58 mg/dL (ref >39)
LDL Chol Calc (NIH): 103 mg/dL — ABNORMAL HIGH (ref 0–99)
Triglycerides: 54 mg/dL (ref 0–149)
VLDL Cholesterol Cal: 11 mg/dL (ref 5–40)

## 2020-09-17 LAB — CBC WITH DIFFERENTIAL/PLATELET
Basophils Absolute: 0 10*3/uL (ref 0.0–0.2)
Basos: 1 %
EOS (ABSOLUTE): 0.3 10*3/uL (ref 0.0–0.4)
Eos: 4 %
Hematocrit: 45.5 % (ref 37.5–51.0)
Hemoglobin: 15.9 g/dL (ref 13.0–17.7)
Immature Grans (Abs): 0 10*3/uL (ref 0.0–0.1)
Immature Granulocytes: 0 %
Lymphocytes Absolute: 2.4 10*3/uL (ref 0.7–3.1)
Lymphs: 33 %
MCH: 31.8 pg (ref 26.6–33.0)
MCHC: 34.9 g/dL (ref 31.5–35.7)
MCV: 91 fL (ref 79–97)
Monocytes Absolute: 0.4 10*3/uL (ref 0.1–0.9)
Monocytes: 6 %
Neutrophils Absolute: 4.1 10*3/uL (ref 1.4–7.0)
Neutrophils: 56 %
Platelets: 206 10*3/uL (ref 150–450)
RBC: 5 x10E6/uL (ref 4.14–5.80)
RDW: 13.5 % (ref 11.6–15.4)
WBC: 7.2 10*3/uL (ref 3.4–10.8)

## 2020-09-17 LAB — CARDIOVASCULAR RISK ASSESSMENT

## 2020-09-17 LAB — TSH: TSH: 1.19 u[IU]/mL (ref 0.450–4.500)

## 2020-12-14 ENCOUNTER — Encounter: Payer: Self-pay | Admitting: Physical Medicine & Rehabilitation

## 2020-12-14 ENCOUNTER — Other Ambulatory Visit: Payer: Self-pay

## 2020-12-14 ENCOUNTER — Encounter
Payer: BC Managed Care – PPO | Attending: Physical Medicine & Rehabilitation | Admitting: Physical Medicine & Rehabilitation

## 2020-12-14 VITALS — BP 144/98 | HR 66 | Temp 98.5°F | Ht 72.0 in | Wt 221.0 lb

## 2020-12-14 DIAGNOSIS — G8111 Spastic hemiplegia affecting right dominant side: Secondary | ICD-10-CM | POA: Insufficient documentation

## 2020-12-14 NOTE — Progress Notes (Signed)
Botox Injection for spasticity using needle EMG guidance  Dilution: 50 Units/ml Indication: Severe spasticity which interferes with ADL,mobility and/or  hygiene and is unresponsive to medication management and other conservative care Informed consent was obtained after describing risks and benefits of the procedure with the patient. This includes bleeding, bruising, infection, excessive weakness, or medication side effects. A REMS form is on file and signed. Needle: 25g 2" needle electrode Number of units per muscle Right lower ext FDL 75U Post tib 75U EHL 50U Gastroc 25U  All injections were done after obtaining appropriate EMG activity and after negative drawback for blood. The patient tolerated the procedure well. Post procedure instructions were given. A followup appointment was made.

## 2020-12-14 NOTE — Patient Instructions (Signed)

## 2021-02-02 ENCOUNTER — Telehealth: Payer: Self-pay

## 2021-02-02 NOTE — Telephone Encounter (Signed)
Called pt left VM to call back to schedule an AWV.  KP

## 2021-02-20 ENCOUNTER — Telehealth: Payer: Self-pay

## 2021-02-20 NOTE — Telephone Encounter (Signed)
I left message on voicemail to call us back to set up an appointment for AWV. ?

## 2021-03-17 ENCOUNTER — Encounter: Payer: Medicare Other | Attending: Physical Medicine & Rehabilitation | Admitting: Physical Medicine & Rehabilitation

## 2021-03-17 ENCOUNTER — Encounter: Payer: Self-pay | Admitting: Physical Medicine & Rehabilitation

## 2021-03-17 ENCOUNTER — Other Ambulatory Visit: Payer: Self-pay

## 2021-03-17 VITALS — BP 122/86 | HR 74 | Ht 72.0 in | Wt 218.2 lb

## 2021-03-17 DIAGNOSIS — G8111 Spastic hemiplegia affecting right dominant side: Secondary | ICD-10-CM | POA: Diagnosis not present

## 2021-03-17 DIAGNOSIS — I10 Essential (primary) hypertension: Secondary | ICD-10-CM | POA: Diagnosis not present

## 2021-03-17 DIAGNOSIS — Z7982 Long term (current) use of aspirin: Secondary | ICD-10-CM | POA: Diagnosis not present

## 2021-03-17 DIAGNOSIS — I69351 Hemiplegia and hemiparesis following cerebral infarction affecting right dominant side: Secondary | ICD-10-CM | POA: Diagnosis not present

## 2021-03-17 DIAGNOSIS — I69320 Aphasia following cerebral infarction: Secondary | ICD-10-CM | POA: Diagnosis not present

## 2021-03-17 DIAGNOSIS — Z7902 Long term (current) use of antithrombotics/antiplatelets: Secondary | ICD-10-CM | POA: Diagnosis not present

## 2021-03-17 DIAGNOSIS — R42 Dizziness and giddiness: Secondary | ICD-10-CM | POA: Diagnosis not present

## 2021-03-17 DIAGNOSIS — K219 Gastro-esophageal reflux disease without esophagitis: Secondary | ICD-10-CM | POA: Diagnosis not present

## 2021-03-17 DIAGNOSIS — I6522 Occlusion and stenosis of left carotid artery: Secondary | ICD-10-CM | POA: Diagnosis not present

## 2021-03-17 DIAGNOSIS — Z79899 Other long term (current) drug therapy: Secondary | ICD-10-CM | POA: Diagnosis not present

## 2021-03-17 NOTE — Progress Notes (Signed)
Dysport Injection for spasticity using needle EMG guidance  Dilution: 200 Units/ml Indication: Severe spasticity which interferes with ADL,mobility and/or  hygiene and is unresponsive to medication management and other conservative care Informed consent was obtained after describing risks and benefits of the procedure with the patient. This includes bleeding, bruising, infection, excessive weakness, or medication side effects. A REMS form is on file and signed. Needle: 25g 2" needle electrode Number of units per muscle Right lower ext FDL 200 EHL 100 Post tib 200  All injections were done after obtaining appropriate EMG activity and after negative drawback for blood. The patient tolerated the procedure well. Post procedure instructions were given. A followup appointment was made.  

## 2021-03-17 NOTE — Patient Instructions (Signed)
Hold off on overhead lifting  I will do Right shoulder injection next visit if no improvement

## 2021-03-18 ENCOUNTER — Other Ambulatory Visit: Payer: Self-pay | Admitting: Physician Assistant

## 2021-03-18 DIAGNOSIS — E785 Hyperlipidemia, unspecified: Secondary | ICD-10-CM

## 2021-03-18 DIAGNOSIS — I6389 Other cerebral infarction: Secondary | ICD-10-CM | POA: Diagnosis not present

## 2021-03-18 DIAGNOSIS — Z8673 Personal history of transient ischemic attack (TIA), and cerebral infarction without residual deficits: Secondary | ICD-10-CM | POA: Diagnosis not present

## 2021-03-18 DIAGNOSIS — I69320 Aphasia following cerebral infarction: Secondary | ICD-10-CM | POA: Diagnosis not present

## 2021-03-18 DIAGNOSIS — Z79899 Other long term (current) drug therapy: Secondary | ICD-10-CM | POA: Diagnosis not present

## 2021-03-18 DIAGNOSIS — I6501 Occlusion and stenosis of right vertebral artery: Secondary | ICD-10-CM | POA: Diagnosis not present

## 2021-03-18 DIAGNOSIS — I10 Essential (primary) hypertension: Secondary | ICD-10-CM

## 2021-03-18 DIAGNOSIS — R42 Dizziness and giddiness: Secondary | ICD-10-CM | POA: Diagnosis not present

## 2021-03-18 DIAGNOSIS — I6522 Occlusion and stenosis of left carotid artery: Secondary | ICD-10-CM | POA: Diagnosis not present

## 2021-03-24 ENCOUNTER — Other Ambulatory Visit: Payer: Self-pay

## 2021-03-24 ENCOUNTER — Encounter: Payer: Self-pay | Admitting: Physician Assistant

## 2021-03-24 ENCOUNTER — Ambulatory Visit (INDEPENDENT_AMBULATORY_CARE_PROVIDER_SITE_OTHER): Payer: BC Managed Care – PPO | Admitting: Physician Assistant

## 2021-03-24 VITALS — BP 126/90 | HR 88 | Temp 97.6°F | Ht 72.0 in | Wt 220.0 lb

## 2021-03-24 DIAGNOSIS — I63512 Cerebral infarction due to unspecified occlusion or stenosis of left middle cerebral artery: Secondary | ICD-10-CM | POA: Diagnosis not present

## 2021-03-24 DIAGNOSIS — I6522 Occlusion and stenosis of left carotid artery: Secondary | ICD-10-CM

## 2021-03-24 DIAGNOSIS — Z8673 Personal history of transient ischemic attack (TIA), and cerebral infarction without residual deficits: Secondary | ICD-10-CM | POA: Diagnosis not present

## 2021-03-24 DIAGNOSIS — I10 Essential (primary) hypertension: Secondary | ICD-10-CM

## 2021-03-24 DIAGNOSIS — I69391 Dysphagia following cerebral infarction: Secondary | ICD-10-CM | POA: Diagnosis not present

## 2021-03-24 LAB — COMPREHENSIVE METABOLIC PANEL
ALT: 26 IU/L (ref 0–44)
AST: 25 IU/L (ref 0–40)
Albumin/Globulin Ratio: 1.8 (ref 1.2–2.2)
Albumin: 4.8 g/dL (ref 4.0–5.0)
Alkaline Phosphatase: 73 IU/L (ref 44–121)
BUN/Creatinine Ratio: 7 — ABNORMAL LOW (ref 9–20)
BUN: 10 mg/dL (ref 6–24)
Bilirubin Total: 0.6 mg/dL (ref 0.0–1.2)
CO2: 27 mmol/L (ref 20–29)
Calcium: 9.6 mg/dL (ref 8.7–10.2)
Chloride: 97 mmol/L (ref 96–106)
Creatinine, Ser: 1.37 mg/dL — ABNORMAL HIGH (ref 0.76–1.27)
Globulin, Total: 2.7 g/dL (ref 1.5–4.5)
Glucose: 86 mg/dL (ref 70–99)
Potassium: 4 mmol/L (ref 3.5–5.2)
Sodium: 140 mmol/L (ref 134–144)
Total Protein: 7.5 g/dL (ref 6.0–8.5)
eGFR: 64 mL/min/{1.73_m2} (ref >59)

## 2021-03-24 NOTE — Progress Notes (Signed)
Subjective:  Patient ID: Mario [name], male    DOB: 10/17/1975  Age: 45 y.o. MRN: [id number]  Chief Complaint  Patient presents with   Hypertension   Hyperlipidemia   Pt is accompanied by his mother and sister HPI  Pt here for hospital follow up - about 10 days ago pt began having symptoms of dizziness, unsteadiness and ataxic gait which was new to him - pt with history of previous stroke with chronic right sided deficits with expressive aphasia and chronic dysarthria - while in the hospital he was found to have significant stenosis in his left internal carotid artery in the supraclinoid region.  He is here to be referred for further evaluation and treatment with vascular surgery and neurology --- he had seen neurology when he had first stroke in 2016 but has not returned for any recent follow up Today he states he continues to have a mild headache and persistent left arm and left leg paresthesias (which also started about 10 days ago) Pt was changed from ASA 325mg  to ASA 81mg  and Plavix 75mg  daily Of note pt had actually run out of his blood pressure medication for a few weeks prior to these new symptoms and has just restarted his norvasc 5mg  qd and hyzaar 100/12.5mg  qd  Pt mentions today that he has had trouble swallowing - He actually states that this is not a new problem however this has not been mentioned to me at prior appointments - states he is having trouble with swallowing food and at times even swallowing saliva Current Outpatient Medications on File Prior to Visit  Medication Sig Dispense Refill   amLODipine (NORVASC) 5 MG tablet TAKE 1 TABLET(5 MG) BY MOUTH DAILY 90 tablet 0   ASPIRIN LOW DOSE 81 MG EC tablet Take 81 mg by mouth daily.     atorvastatin (LIPITOR) 40 MG tablet TAKE 1 TABLET(40 MG) BY MOUTH DAILY 90 tablet 0   clopidogrel (PLAVIX) 75 MG tablet Take 75 mg by mouth daily.     loratadine (CLARITIN) 10 MG tablet TK 1 T PO QD FOR ALLERGIES  5    losartan-hydrochlorothiazide (HYZAAR) 100-12.5 MG tablet Take 1 tablet by mouth daily. 90 tablet 1   Multiple Vitamins-Minerals (MULTIVITAMIN ADULT) TABS Take 1 tablet by mouth daily.     Omega 3 1000 MG CAPS Take 0.1 capsules (100 mg total) by mouth daily. 90 each 1   No current facility-administered medications on file prior to visit.   Past Medical History:  Diagnosis Date   Hypertension    Stroke [facility])    Past Surgical History:  Procedure Laterality Date   NO PAST SURGERIES      Family History  Problem Relation Age of Onset   Stroke Maternal Grandmother    Social History   Socioeconomic History   Marital status: Married    Spouse name: Not on file   Number of children: Not on file   Years of education: Not on file   Highest education level: Not on file  Occupational History   Not on file  Tobacco Use   Smoking status: Never   Smokeless tobacco: Never  Vaping Use   Vaping Use: Never used  Substance and Sexual Activity   Alcohol use: No   Drug use: No   Sexual activity: Not on file  Other Topics Concern   Not on file  Social History Narrative   Not on file   Social Determinants of Health   Financial Resource Strain:  Not on file  Food Insecurity: Not on file  Transportation Needs: Not on file  Physical Activity: Not on file  Stress: Not on file  Social Connections: Not on file    Review of Systems CONSTITUTIONAL: Negative for chills, fatigue, fever, unintentional weight gain and unintentional weight loss.  E/N/T: Negative for ear pain, nasal congestion and sore throat.  CARDIOVASCULAR: see HPI RESPIRATORY: Negative for recent cough and dyspnea.  GASTROINTESTINAL: Negative for abdominal pain, acid reflux symptoms, constipation, diarrhea, nausea and vomiting.  MSK: see HPI  INTEGUMENTARY: Negative for rash.  NEUROLOGICAL: see HPI PSYCHIATRIC: Negative for sleep disturbance and to question depression screen.  Negative for depression, negative for anhedonia.        Objective:  BP 126/90 (BP Location: Right Arm, Patient Position: Sitting)   Pulse 88   Temp 97.6 F (36.4 C) (Temporal)   Ht 6' (1.829 m)   Wt 220 lb (99.8 kg)   SpO2 98%   BMI 29.84 kg/m   BP/Weight 03/24/2021 03/17/2021 12/14/2020  Systolic BP 126 122 144  Diastolic BP 90 86 98  Wt. (Lbs) 220 218.2 221  BMI 29.84 29.59 29.97    Physical Exam PHYSICAL EXAM:   VS: BP 126/90 (BP Location: Right Arm, Patient Position: Sitting)   Pulse 88   Temp 97.6 F (36.4 C) (Temporal)   Ht 6' (1.829 m)   Wt 220 lb (99.8 kg)   SpO2 98%   BMI 29.84 kg/m   GEN: Well nourished, well developed, in no acute distress  HEENT: normal external ears and nose - normal external auditory canals and TMS - hearing grossly normal -  Lips, Teeth and Gums - normal  Oropharynx - normal mucosa, palate, and posterior pharynx Neck: no JVD or masses - no thyromegaly Cardiac: RRR; no murmurs, rubs, or gallops,no edema -  Respiratory:  normal respiratory rate and pattern with no distress - normal breath sounds with no rales, rhonchi, wheezes or rubs Skin: warm and dry, no rash  Neuro:  Alert and Oriented x 3,  Psych: euthymic mood, appropriate affect and demeanor  Diabetic Foot Exam - Simple   No data filed      Lab Results  Component Value Date   WBC 7.2 09/16/2020   HGB 15.9 09/16/2020   HCT 45.5 09/16/2020   PLT 206 09/16/2020   GLUCOSE 116 (H) 09/16/2020   CHOL 172 09/16/2020   TRIG 54 09/16/2020   HDL 58 09/16/2020   LDLCALC 103 (H) 09/16/2020   ALT 35 09/16/2020   AST 38 09/16/2020   NA 137 09/16/2020   K 4.3 09/16/2020   CL 98 09/16/2020   CREATININE 1.67 (H) 09/16/2020   BUN 14 09/16/2020   CO2 22 09/16/2020   TSH 1.190 09/16/2020   INR 1.13 10/22/2014   HGBA1C 5.2 11/16/2015      Assessment & Plan:   Problem List Items Addressed This Visit       Cardiovascular and Mediastinum   Left middle cerebral artery stroke (HCC)   Relevant Medications   ASPIRIN LOW DOSE  81 MG EC tablet   Other Relevant Orders   Ambulatory referral to Neurology   Ambulatory referral to Vascular Surgery   Essential hypertension - Primary   Relevant Medications   ASPIRIN LOW DOSE 81 MG EC tablet   Other Relevant Orders   Comprehensive metabolic panel   Ambulatory referral to Neurology   Ambulatory referral to Vascular Surgery   LONG TERM MONITOR (3-14 DAYS)   Carotid  artery stenosis, symptomatic, left   Relevant Medications   ASPIRIN LOW DOSE 81 MG EC tablet     Digestive   Dysphagia as late effect of cerebrovascular accident (CVA)   Relevant Orders   SLP modified barium swallow     Other   History of stroke   Relevant Orders   Ambulatory referral to Neurology   Ambulatory referral to Vascular Surgery   LONG TERM MONITOR (3-14 DAYS)  .  No orders of the defined types were placed in this encounter.   Orders Placed This Encounter  Procedures   Comprehensive metabolic panel   Ambulatory referral to Neurology   Ambulatory referral to Vascular Surgery   SLP modified barium swallow   LONG TERM MONITOR (3-14 DAYS)     Follow-up: Return in about 4 weeks (around 04/21/2021) for with me and in 2 weeks for bp check nurse visit.  An After Visit Summary was printed and given to the patient.  Jettie Pagan [facility] [phone]

## 2021-03-25 ENCOUNTER — Telehealth (HOSPITAL_COMMUNITY): Payer: Self-pay | Admitting: *Deleted

## 2021-03-25 NOTE — Telephone Encounter (Signed)
Attempted to contact patient to schedule OP MBS. Left VM. RKEEL 

## 2021-03-28 ENCOUNTER — Other Ambulatory Visit: Payer: Self-pay | Admitting: *Deleted

## 2021-03-28 ENCOUNTER — Other Ambulatory Visit (HOSPITAL_COMMUNITY): Payer: Self-pay

## 2021-03-28 DIAGNOSIS — I6522 Occlusion and stenosis of left carotid artery: Secondary | ICD-10-CM

## 2021-03-28 DIAGNOSIS — Z8673 Personal history of transient ischemic attack (TIA), and cerebral infarction without residual deficits: Secondary | ICD-10-CM

## 2021-03-28 DIAGNOSIS — R1310 Dysphagia, unspecified: Secondary | ICD-10-CM

## 2021-03-30 ENCOUNTER — Ambulatory Visit (HOSPITAL_COMMUNITY)
Admission: RE | Admit: 2021-03-30 | Discharge: 2021-03-30 | Disposition: A | Discharge status: Home / Self Care | Payer: Medicare Other | Source: Ambulatory Visit | Attending: Vascular Surgery | Admitting: Vascular Surgery

## 2021-03-30 ENCOUNTER — Ambulatory Visit (INDEPENDENT_AMBULATORY_CARE_PROVIDER_SITE_OTHER): Payer: Medicare Other | Admitting: Vascular Surgery

## 2021-03-30 ENCOUNTER — Other Ambulatory Visit: Payer: Self-pay

## 2021-03-30 ENCOUNTER — Encounter: Payer: Self-pay | Admitting: Vascular Surgery

## 2021-03-30 VITALS — BP 132/95 | HR 70 | Temp 98.0°F | Resp 20 | Ht 72.0 in | Wt 217.9 lb

## 2021-03-30 DIAGNOSIS — I6522 Occlusion and stenosis of left carotid artery: Secondary | ICD-10-CM | POA: Insufficient documentation

## 2021-03-30 DIAGNOSIS — Z8673 Personal history of transient ischemic attack (TIA), and cerebral infarction without residual deficits: Secondary | ICD-10-CM

## 2021-03-30 NOTE — Progress Notes (Signed)
Patient ID: Mario [name], male   DOB: Sep 01, 1975, 45 y.o.   MRN: [id number]  Reason for Consult: New Patient (Initial Visit)   Referred by Marianne Sofia, PA-C  Subjective:     HPI:  Mario [name] is a 45 y.o. male with history of left MCA stroke about 7 years ago.  He has residual right upper extremity and right lower extremity weakness and also has speech difficulty.  He is sent here for carotid stenosis.  He has not had any recent strokes.  Per his father they never had a reasoning for his stroke.  He was taking aspirin and a statin and previously was taking Plavix but that has been discontinued due to some GI issues.  He has been having difficulty with swallowing really for at least 6 years but more recently he also feels something in his neck.  He does not have any pain with swallowing.  He continues to eat without too much issue.  Swallowing can be with food and/or his saliva.  He is here today with carotid duplex for evaluation.  Past Medical History:  Diagnosis Date   Hypertension    Stroke [facility])    Family History  Problem Relation Age of Onset   Stroke Maternal Grandmother    Past Surgical History:  Procedure Laterality Date   NO PAST SURGERIES      Short Social History:  Social History   Tobacco Use   Smoking status: Never   Smokeless tobacco: Never  Substance Use Topics   Alcohol use: No    No Known Allergies  Current Outpatient Medications  Medication Sig Dispense Refill   amLODipine (NORVASC) 5 MG tablet TAKE 1 TABLET(5 MG) BY MOUTH DAILY 90 tablet 0   ASPIRIN LOW DOSE 81 MG EC tablet Take 81 mg by mouth daily.     atorvastatin (LIPITOR) 40 MG tablet TAKE 1 TABLET(40 MG) BY MOUTH DAILY 90 tablet 0   clopidogrel (PLAVIX) 75 MG tablet Take 75 mg by mouth daily.     loratadine (CLARITIN) 10 MG tablet TK 1 T PO QD FOR ALLERGIES  5   losartan-hydrochlorothiazide (HYZAAR) 100-12.5 MG tablet Take 1 tablet by mouth daily. 90 tablet 1   Multiple Vitamins-Minerals  (MULTIVITAMIN ADULT) TABS Take 1 tablet by mouth daily.     Omega 3 1000 MG CAPS Take 0.1 capsules (100 mg total) by mouth daily. 90 each 1   No current facility-administered medications for this visit.    Review of Systems  Constitutional:  Constitutional negative. HENT: HENT negative.  Eyes: Eyes negative.  Respiratory: Respiratory negative.  Cardiovascular: Cardiovascular negative.  GI: Gastrointestinal negative.  Musculoskeletal: Musculoskeletal negative.  Skin: Skin negative.  Neurological: Positive for focal weakness and speech difficulty.  Hematologic: Hematologic/lymphatic negative.  Psychiatric: Psychiatric negative.       Objective:  Objective  Vitals:   03/30/21 1325 03/30/21 1327  BP: (!) 127/94 (!) 132/95  Pulse: 70   Resp: 20   Temp: 98 F (36.7 C)   SpO2: 98%      Physical Exam HENT:     Head: Normocephalic.     Nose:     Comments: Wearing a mask Eyes:     Pupils: Pupils are equal, round, and reactive to light.  Neck:     Vascular: No carotid bruit.  Cardiovascular:     Rate and Rhythm: Normal rate.  Pulmonary:     Effort: Pulmonary effort is normal.  Abdominal:     General:  Abdomen is flat.     Palpations: Abdomen is soft.  Musculoskeletal:        General: Normal range of motion.     Cervical back: Normal range of motion.     Right lower leg: No edema.     Left lower leg: No edema.  Skin:    General: Skin is warm and dry.     Capillary Refill: Capillary refill takes less than 2 seconds.  Neurological:     Mental Status: He is alert.     Motor: Weakness present.  Psychiatric:        Mood and Affect: Mood normal.        Behavior: Behavior normal.        Thought Content: Thought content normal.        Judgment: Judgment normal.    Data: Right Carotid Findings:  +----------+--------+--------+--------+------------------+--------+            PSV cm/sEDV cm/sStenosisPlaque DescriptionComments   +----------+--------+--------+--------+------------------+--------+  CCA Prox  74      24                                          +----------+--------+--------+--------+------------------+--------+  CCA Mid   92      30                                          +----------+--------+--------+--------+------------------+--------+  CCA Distal94      33                                          +----------+--------+--------+--------+------------------+--------+  ICA Prox  101     30      1-39%                               +----------+--------+--------+--------+------------------+--------+  ICA Mid   83      37                                          +----------+--------+--------+--------+------------------+--------+  ICA Distal73      33                                          +----------+--------+--------+--------+------------------+--------+  ECA       80      25                                          +----------+--------+--------+--------+------------------+--------+   +----------+--------+-------+----------------+-------------------+            PSV cm/sEDV cmsDescribe        Arm Pressure (mmHG)  +----------+--------+-------+----------------+-------------------+  [id number]      4      Multiphasic, WNL                     +----------+--------+-------+----------------+-------------------+   +---------+--------+--+--------+--+---------+  VertebralPSV cm/s32EDV cm/s13Antegrade  +---------+--------+--+--------+--+---------+       Left Carotid Findings:  +----------+--------+--------+--------+------------------+--------+            PSV cm/sEDV cm/sStenosisPlaque DescriptionComments  +----------+--------+--------+--------+------------------+--------+  CCA Prox  98      18                                          +----------+--------+--------+--------+------------------+--------+  CCA Mid   101      19                                          +----------+--------+--------+--------+------------------+--------+  CCA Distal82      27              heterogenous                +----------+--------+--------+--------+------------------+--------+  ICA Prox  50      22      1-39%                     tortuous  +----------+--------+--------+--------+------------------+--------+  ICA Mid   40      17                                          +----------+--------+--------+--------+------------------+--------+  ICA Distal38      16                                          +----------+--------+--------+--------+------------------+--------+  ECA       54      14                                          +----------+--------+--------+--------+------------------+--------+   +----------+--------+--------+----------------+-------------------+            PSV cm/sEDV cm/sDescribe        Arm Pressure (mmHG)  +----------+--------+--------+----------------+-------------------+  [id number]      2       Multiphasic, WNL                     +----------+--------+--------+----------------+-------------------+   +---------+--------+--+--------+--+---------+  VertebralPSV cm/s41EDV cm/s18Antegrade  +---------+--------+--+--------+--+---------+          Assessment/Plan:     45 year old male here for carotid evaluation.  He does not have any recent strokes.  Minimal stenosis bilaterally.  He will follow-up in 5 years with repeat duplex.  Continue aspirin and statin.     Maeola Harman MD [facility]

## 2021-04-06 ENCOUNTER — Ambulatory Visit (HOSPITAL_COMMUNITY)
Admission: RE | Admit: 2021-04-06 | Discharge: 2021-04-06 | Disposition: A | Discharge status: Home / Self Care | Payer: Medicare Other | Source: Ambulatory Visit | Attending: Physician Assistant | Admitting: Physician Assistant

## 2021-04-06 ENCOUNTER — Other Ambulatory Visit: Payer: Self-pay

## 2021-04-06 ENCOUNTER — Telehealth: Payer: Self-pay

## 2021-04-06 DIAGNOSIS — R1310 Dysphagia, unspecified: Secondary | ICD-10-CM | POA: Insufficient documentation

## 2021-04-06 DIAGNOSIS — I69391 Dysphagia following cerebral infarction: Secondary | ICD-10-CM | POA: Diagnosis not present

## 2021-04-06 NOTE — Telephone Encounter (Signed)
Called patient left detailed message, stated for patient to call office with any questions.

## 2021-04-06 NOTE — Progress Notes (Signed)
Modified Barium Swallow Progress Note  Patient Details  Name: Mario [name] MRN: [id number] Date of Birth: 18-Mar-1976  Today's Date: 04/06/2021  Modified Barium Swallow completed.  Full report located under Chart Review in the Imaging Section.  Brief recommendations include the following:  Clinical Impression  Pt was referred for an MBS by PCP after concerns of chronic dysphagia since 2016 stroke were raised by patient. Pt reports experiencing globus sensation in "right throat" across all consistencies and saliva. Overall, pt exhibits mild pharyngeal dysphagia c/b mild residue in vallecula and pyriform sinuses with thin liquid and puree consistencies with no material entering the airway. Oral mechanism exam revealed mild right sided asymmetry and reduced facial strength but was otherwise unremarkable. Pt exhibited no oral phase impairments across consistencies. With thin liquid from cup and straw and puree, pt exhibited mild residue in vallecula and pyriform sinuses which was cleared by multiple swallows. Pt exhibited no phargyngeal phase impairments with regular solids. No evidence found of penetration or aspiration across consistencies. Barium pill cleared with no delay or difficulty. Although MBS does not diagnose esophageal impairments, quick scan of the esophagus revealed no overt esophageal dismotility nor residue. Recommand regular/thin liquid diet, administering medications whole with thin liquid. Pt requires no further ST services at this time.   Swallow Evaluation Recommendations       SLP Diet Recommendations: Regular solids;Thin liquid   Liquid Administration via: Cup;Straw   Medication Administration: Whole meds with liquid   Supervision: Patient able to self feed   Compensations: Multiple dry swallows after each bite/sip;Follow solids with liquid   Postural Changes: Seated upright at 90 degrees   Oral Care Recommendations: Oral care BID        Royce Macadamia 04/06/2021,3:32 PM   Breck Coons Lonell Face.Ed Nurse, children's [phone] Office [phone]

## 2021-04-06 NOTE — Telephone Encounter (Signed)
-----   Message from Marianne Sofia, [place] sent at 04/06/2021  4:35 PM EDT ----- Notify swallow study normal If symptoms persist recommend referral to GI for endoscopy ----- Message ----- From: Abe People, CCC-SLP Sent: 04/06/2021   3:31 PM EDT To: Marianne Sofia, PA-C

## 2021-04-07 ENCOUNTER — Telehealth: Payer: Self-pay

## 2021-04-07 ENCOUNTER — Ambulatory Visit: Payer: BC Managed Care – PPO

## 2021-04-07 ENCOUNTER — Other Ambulatory Visit: Payer: Self-pay | Admitting: Physician Assistant

## 2021-04-07 VITALS — BP 136/92

## 2021-04-07 DIAGNOSIS — R42 Dizziness and giddiness: Secondary | ICD-10-CM

## 2021-04-07 DIAGNOSIS — I10 Essential (primary) hypertension: Secondary | ICD-10-CM

## 2021-04-07 DIAGNOSIS — I63512 Cerebral infarction due to unspecified occlusion or stenosis of left middle cerebral artery: Secondary | ICD-10-CM

## 2021-04-07 NOTE — Telephone Encounter (Signed)
Patient came in today for a 2 week nurse visit to have his BP rechecked. Today's reading was 136/92 last office visit BP was 126/90. Patient was informed that you wanted to review his chart prior to adjusting any medications (if that is what is needed). Aware that our office will contact him.

## 2021-04-07 NOTE — Progress Notes (Signed)
Patient informed that Dr. Sedalia Muta will review his chart today and our office will contact him in regards to rather or not any of his medications need to adjusted. Message will be sent to Dr. Sedalia Muta.

## 2021-04-07 NOTE — Telephone Encounter (Signed)
Patients family member who was present today during his NV stated that it was suggested that patient see a ENT for his dizziness. Patient would like to see a ENT who is with [facility] if possible.

## 2021-04-08 NOTE — Telephone Encounter (Signed)
Left message informing patient to increase norvasc to 10 mg.

## 2021-04-08 NOTE — Telephone Encounter (Signed)
Left message on [name] (sister) voicemail informing her that patient needs to increase amlodipine to 10 mg daily.

## 2021-04-11 DIAGNOSIS — R519 Headache, unspecified: Secondary | ICD-10-CM | POA: Diagnosis not present

## 2021-04-11 DIAGNOSIS — R42 Dizziness and giddiness: Secondary | ICD-10-CM | POA: Diagnosis not present

## 2021-04-20 ENCOUNTER — Ambulatory Visit: Payer: BC Managed Care – PPO | Admitting: Physician Assistant

## 2021-04-21 ENCOUNTER — Ambulatory Visit: Payer: BC Managed Care – PPO | Admitting: Neurology

## 2021-04-21 ENCOUNTER — Ambulatory Visit: Payer: BC Managed Care – PPO | Admitting: Physician Assistant

## 2021-04-25 ENCOUNTER — Ambulatory Visit: Payer: BC Managed Care – PPO | Admitting: Physician Assistant

## 2021-04-26 ENCOUNTER — Ambulatory Visit (INDEPENDENT_AMBULATORY_CARE_PROVIDER_SITE_OTHER): Payer: Medicare Other | Admitting: Physician Assistant

## 2021-04-26 ENCOUNTER — Other Ambulatory Visit: Payer: Self-pay

## 2021-04-26 ENCOUNTER — Encounter: Payer: Self-pay | Admitting: Physician Assistant

## 2021-04-26 VITALS — BP 128/76 | HR 98 | Temp 97.7°F | Ht 72.0 in | Wt 221.6 lb

## 2021-04-26 DIAGNOSIS — I69359 Hemiplegia and hemiparesis following cerebral infarction affecting unspecified side: Secondary | ICD-10-CM | POA: Diagnosis not present

## 2021-04-26 DIAGNOSIS — I10 Essential (primary) hypertension: Secondary | ICD-10-CM | POA: Diagnosis not present

## 2021-04-26 DIAGNOSIS — G8110 Spastic hemiplegia affecting unspecified side: Secondary | ICD-10-CM

## 2021-04-26 DIAGNOSIS — I69320 Aphasia following cerebral infarction: Secondary | ICD-10-CM

## 2021-04-26 DIAGNOSIS — I63512 Cerebral infarction due to unspecified occlusion or stenosis of left middle cerebral artery: Secondary | ICD-10-CM

## 2021-04-26 DIAGNOSIS — R42 Dizziness and giddiness: Secondary | ICD-10-CM | POA: Diagnosis not present

## 2021-04-26 MED ORDER — MECLIZINE HCL 25 MG PO TABS: 25.0000 mg | ORAL_TABLET | Freq: Three times a day (TID) | ORAL | 1 refills | Status: DC | PRN

## 2021-04-26 MED ORDER — DICLOFENAC SODIUM 1 % EX GEL: 2.0000 g | Freq: Four times a day (QID) | CUTANEOUS | 2 refills | Status: DC

## 2021-04-26 NOTE — Progress Notes (Signed)
Subjective:  Patient ID: Mario [name], male    DOB: 28-Jun-1975  Age: 45 y.o. MRN: [id number]  Chief Complaint  Patient presents with   Hypertension    HPI  Pt here today for follow up of hypertension - he is currently on hyzaar 100/12.5mg  qd - he states his bp has running well and denies chest pain/sob  Pt has had several episodes of dizziness and treated at ED recently for vertigo with meclizine 25mg  tid prn - he states that the medication helped greatly- he has not taken any med in about 5 days and the dizziness has completely resolved - would like to have refill to keep on hand if needed There had been a prior request to see ENT but it is noted that referral was not done (and I was not told the provider had retired - the referral was just cancelled) however patient and his mother agree that he does not want ENT referral at this time since symptoms resolved  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 and has been referred to Dr vascular surgeon who patient has seen for visit - he had carotids evaluated which showed minimal stenosis bilaterally --- was advised to continue ASA and statin and repeat duplex carotid in 5 years  Pt is overdue to see neurologist and a referral had been initiated to see Dr Randie Heinz however mother and patient do not want to go back due to experience they had at that office and would like to see Dr Pearlean Brownie with [facility] instead Current Outpatient Medications on File Prior to Visit  Medication Sig Dispense Refill   amLODipine (NORVASC) 5 MG tablet TAKE 1 TABLET(5 MG) BY MOUTH DAILY 90 tablet 0   ASPIRIN LOW DOSE 81 MG EC tablet Take 81 mg by mouth daily.     atorvastatin (LIPITOR) 40 MG tablet TAKE 1 TABLET(40 MG) BY MOUTH DAILY 90 tablet 0   clopidogrel (PLAVIX) 75 MG tablet Take 75 mg by mouth daily.     loratadine (CLARITIN) 10 MG tablet TK 1 T PO QD FOR ALLERGIES  5   losartan-hydrochlorothiazide (HYZAAR) 100-12.5 MG tablet Take 1  tablet by mouth daily. 90 tablet 1   Multiple Vitamins-Minerals (MULTIVITAMIN ADULT) TABS Take 1 tablet by mouth daily.     Omega 3 1000 MG CAPS Take 0.1 capsules (100 mg total) by mouth daily. 90 each 1   No current facility-administered medications on file prior to visit.   Past Medical History:  Diagnosis Date   Hypertension    Stroke [facility])    Past Surgical History:  Procedure Laterality Date   NO PAST SURGERIES      Family History  Problem Relation Age of Onset   Stroke Maternal Grandmother    Social History   Socioeconomic History   Marital status: Married    Spouse name: Not on file   Number of children: Not on file   Years of education: Not on file   Highest education level: Not on file  Occupational History   Not on file  Tobacco Use   Smoking status: Never   Smokeless tobacco: Never  Vaping Use   Vaping Use: Never used  Substance and Sexual Activity   Alcohol use: No   Drug use: No   Sexual activity: Not on file  Other Topics Concern   Not on file  Social History Narrative   Not on file   Social Determinants of Health  Financial Resource Strain: Not on file  Food Insecurity: Not on file  Transportation Needs: Not on file  Physical Activity: Not on file  Stress: Not on file  Social Connections: Not on file    Review of Systems CONSTITUTIONAL: Negative for chills, fatigue, fever, unintentional weight gain and unintentional weight loss.  E/N/T: Negative for ear pain, nasal congestion and sore throat.  CARDIOVASCULAR: Negative for chest pain, dizziness, palpitations and pedal edema.  RESPIRATORY: Negative for recent cough and dyspnea.  INTEGUMENTARY: Negative for rash.  NEUROLOGICAL: Negative for dizziness and headaches.       Objective:  BP 128/76 (BP Location: Left Arm, Patient Position: Sitting, Cuff Size: Large)   Pulse 98   Temp 97.7 F (36.5 C) (Temporal)   Ht 6' (1.829 m)   Wt 221 lb 9.6 oz (100.5 kg)   SpO2 95%   BMI 30.05 kg/m    BP/Weight 04/26/2021 04/07/2021 03/30/2021  Systolic BP 128 136 132  Diastolic BP 76 92 95  Wt. (Lbs) 221.6 - 217.9  BMI 30.05 - 29.55    Physical Exam PHYSICAL EXAM:   VS: BP 128/76 (BP Location: Left Arm, Patient Position: Sitting, Cuff Size: Large)   Pulse 98   Temp 97.7 F (36.5 C) (Temporal)   Ht 6' (1.829 m)   Wt 221 lb 9.6 oz (100.5 kg)   SpO2 95%   BMI 30.05 kg/m   GEN: Well nourished, well developed, in no acute distress  HEENT: normal external ears and nose - normal external auditory canals and TMS - - Lips, Teeth and Gums - normal  Oropharynx - normal mucosa, palate, and posterior pharynx Cardiac: RRR; no murmurs, rubs, or gallops,no edema -  Respiratory:  normal respiratory rate and pattern with no distress - normal breath sounds with no rales, rhonchi, wheezes or rubs Psych: euthymic mood, appropriate affect and demeanor  Diabetic Foot Exam - Simple   No data filed      Lab Results  Component Value Date   WBC 7.2 09/16/2020   HGB 15.9 09/16/2020   HCT 45.5 09/16/2020   PLT 206 09/16/2020   GLUCOSE 86 03/24/2021   CHOL 172 09/16/2020   TRIG 54 09/16/2020   HDL 58 09/16/2020   LDLCALC 103 (H) 09/16/2020   ALT 26 03/24/2021   AST 25 03/24/2021   NA 140 03/24/2021   K 4.0 03/24/2021   CL 97 03/24/2021   CREATININE 1.37 (H) 03/24/2021   BUN 10 03/24/2021   CO2 27 03/24/2021   TSH 1.190 09/16/2020   INR 1.13 10/22/2014   HGBA1C 5.2 11/16/2015      Assessment & Plan:   Problem List Items Addressed This Visit       Cardiovascular and Mediastinum   Left middle cerebral artery stroke [facility])   Relevant Orders   Ambulatory referral to Neurology   Essential hypertension - Primary Continue current meds     Nervous and Auditory   Spastic hemiparesis (HCC)   Relevant Orders   Ambulatory referral to Neurology   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  Meds ordered this encounter  Medications   meclizine (ANTIVERT) 25 MG tablet    Sig: Take 1 tablet (25 mg total) by mouth 3 (three) times daily as needed.  Dispense:  30 tablet    Refill:  1    Order Specific Question:   Supervising Provider    Answer:   Corey Harold   diclofenac Sodium (VOLTAREN) 1 % GEL    Sig: Apply 2 g topically 4 (four) times daily.    Dispense:  100 g    Refill:  2    Order Specific Question:   Supervising Provider    AnswerCorey Harold    Orders Placed This Encounter  Procedures   Ambulatory referral to Neurology     Follow-up: Return in about 2 months (around 06/26/2021) for chronic fasting follow up.  An After Visit Summary was printed and given to the patient.  Jettie Pagan [facility] [phone]

## 2021-05-10 ENCOUNTER — Encounter: Payer: Medicare Other | Admitting: Physical Medicine & Rehabilitation

## 2021-06-28 ENCOUNTER — Ambulatory Visit: Payer: BC Managed Care – PPO | Admitting: Physician Assistant

## 2021-06-29 ENCOUNTER — Ambulatory Visit: Payer: BC Managed Care – PPO | Admitting: Neurology

## 2021-06-30 ENCOUNTER — Ambulatory Visit: Payer: BC Managed Care – PPO | Admitting: Physical Medicine & Rehabilitation

## 2021-07-08 ENCOUNTER — Encounter: Payer: Medicare Other | Attending: Physical Medicine & Rehabilitation | Admitting: Physical Medicine & Rehabilitation

## 2021-07-08 ENCOUNTER — Other Ambulatory Visit: Payer: Self-pay | Admitting: Physician Assistant

## 2021-07-08 ENCOUNTER — Other Ambulatory Visit: Payer: Self-pay

## 2021-07-08 ENCOUNTER — Encounter: Payer: Self-pay | Admitting: Physical Medicine & Rehabilitation

## 2021-07-08 VITALS — BP 144/110 | HR 86 | Temp 98.0°F | Ht 72.0 in | Wt 235.0 lb

## 2021-07-08 DIAGNOSIS — G8111 Spastic hemiplegia affecting right dominant side: Secondary | ICD-10-CM | POA: Diagnosis not present

## 2021-07-08 DIAGNOSIS — E785 Hyperlipidemia, unspecified: Secondary | ICD-10-CM

## 2021-07-08 NOTE — Progress Notes (Signed)
Dysport Injection for spasticity using needle EMG guidance  Dilution: 200 Units/ml Indication: Severe spasticity which interferes with ADL,mobility and/or  hygiene and is unresponsive to medication management and other conservative care Informed consent was obtained after describing risks and benefits of the procedure with the patient. This includes bleeding, bruising, infection, excessive weakness, or medication side effects. A REMS form is on file and signed. Needle: 25g 2" needle electrode Number of units per muscle Right lower ext FDL 200 EHL 100 Post tib 200  All injections were done after obtaining appropriate EMG activity and after negative drawback for blood. The patient tolerated the procedure well. Post procedure instructions were given. A followup appointment was made.   Subjective:    Patient ID: Mario [name], male    DOB: December 16, 1975, 46 y.o.   MRN: [id number]  HPI 46 year old male with history of left MCA distribution infarct in 2016.  He went through inpatient rehabilitation followed by outpatient rehabilitation extensively. The patient remains aphasic expressively but has excellent comprehension He continues have weakness in the right upper and right lower limb although he is ambulatory and modified independent with all self-care and mobility. Pain Inventory Average Pain 10 Pain Right Now 10 My pain is constant  In the last 24 hours, has pain interfered with the following? General activity 0 Relation with others 0 Enjoyment of life 0 What TIME of day is your pain at its worst? varies Sleep (in general) Good  Pain is worse with: unsure Pain improves with: therapy/exercise Relief from Meds: n/a  Family History  Problem Relation Age of Onset   Stroke Maternal Grandmother    Social History   Socioeconomic History   Marital status: Married    Spouse name: Not on file   Number of children: Not on file   Years of education: Not on file   Highest education  level: Not on file  Occupational History   Not on file  Tobacco Use   Smoking status: Never   Smokeless tobacco: Never  Vaping Use   Vaping Use: Never used  Substance and Sexual Activity   Alcohol use: No   Drug use: No   Sexual activity: Not on file  Other Topics Concern   Not on file  Social History Narrative   Not on file   Social Determinants of Health   Financial Resource Strain: Not on file  Food Insecurity: Not on file  Transportation Needs: Not on file  Physical Activity: Not on file  Stress: Not on file  Social Connections: Not on file   Past Surgical History:  Procedure Laterality Date   NO PAST SURGERIES     Past Surgical History:  Procedure Laterality Date   NO PAST SURGERIES     Past Medical History:  Diagnosis Date   Hypertension    Stroke (HCC)    BP (!) 145/103    Pulse 86    Temp 98 F (36.7 C) (Oral)    Ht 6' (1.829 m)    Wt 235 lb (106.6 kg)    SpO2 98%    BMI 31.87 kg/m   Opioid Risk Score:   Fall Risk Score:  `1  Depression screen PHQ 2/9  Depression screen [facility] 2/9 03/17/2021 09/16/2020 09/14/2020 05/11/2020 03/12/2018 01/01/2017 04/13/2015  Decreased Interest 0 0 0 0 0 3 0  Down, Depressed, Hopeless 0 0 0 0 0 3 1  PHQ - 2 Score 0 0 0 0 0 6 1  Altered sleeping - - - - - -  0  Tired, decreased energy - - - - - - 0  Change in appetite - - - - - - 1  Feeling bad or failure about yourself  - - - - - - 1  Trouble concentrating - - - - - - 1  Moving slowly or fidgety/restless - - - - - - 1  Suicidal thoughts - - - - - - 0  PHQ-9 Score - - - - - - 5  Some recent data might be hidden      Review of Systems  Constitutional: Negative.   HENT: Negative.    Eyes: Negative.   Respiratory: Negative.    Cardiovascular: Negative.   Gastrointestinal: Negative.   Endocrine: Negative.   Genitourinary: Negative.   Musculoskeletal:  Positive for back pain.       Pain in the right side  Skin: Negative.   Allergic/Immunologic: Negative.    Neurological: Negative.   Hematological: Negative.   Psychiatric/Behavioral: Negative.        Objective:   Physical Exam Vitals and nursing note reviewed.  Constitutional:      Appearance: Normal appearance.  HENT:     Head: Normocephalic and atraumatic.  Eyes:     Extraocular Movements: Extraocular movements intact.     Conjunctiva/sclera: Conjunctivae normal.     Pupils: Pupils are equal, round, and reactive to light.  Musculoskeletal:        General: Normal range of motion.  Skin:    General: Skin is warm and dry.  Neurological:     Mental Status: He is alert and oriented to person, place, and time.  Psychiatric:        Mood and Affect: Mood normal.        Behavior: Behavior normal.  Motor strength is 4/5 in the right deltoid bicep tricep grip hip flexion knee extension ankle dorsiflexion There is increased tone in the toe flexors and foot inverters. Mildly increased tone in the finger flexors MAS 1. Musculoskeletal positive impingement sign right shoulder with abduction.        Assessment & Plan:    RIght spastic hemiparesis and aphasia after Left MCA infarct , would benefit from repeat dysport injection in the RLE We also discussed reduced strength and motor control RUE , may be a good candidate for vivistim  2.   Shoulder injection without ultrasound guidance  Indication:Right Shoulder pain not relieved by medication management and other conservative care.  Informed consent was obtained after describing risks and benefits of the procedure with the patient, this includes bleeding, bruising, infection and medication side effects. The patient wishes to proceed and has given written consent. Patient was placed in a seated position. The right shoulder was marked and prepped with betadine in the subacromial area. A 25-gauge 1-1/2 inch needle was inserted into the subacromial area. After negative draw back for blood, a solution containing 1 mL of 6 mg per ML betamethasone  and 4 mL of 1% lidocaine was injected. A band aid was applied. The patient tolerated the procedure well. Post procedure instructions were given.

## 2021-07-15 ENCOUNTER — Other Ambulatory Visit: Payer: Self-pay

## 2021-07-15 ENCOUNTER — Ambulatory Visit (INDEPENDENT_AMBULATORY_CARE_PROVIDER_SITE_OTHER): Payer: Medicare Other | Admitting: Physician Assistant

## 2021-07-15 ENCOUNTER — Encounter: Payer: Self-pay | Admitting: Physician Assistant

## 2021-07-15 VITALS — BP 122/98 | HR 74 | Temp 98.9°F | Ht 72.0 in | Wt 224.0 lb

## 2021-07-15 DIAGNOSIS — I10 Essential (primary) hypertension: Secondary | ICD-10-CM

## 2021-07-15 DIAGNOSIS — I63512 Cerebral infarction due to unspecified occlusion or stenosis of left middle cerebral artery: Secondary | ICD-10-CM

## 2021-07-15 DIAGNOSIS — Z23 Encounter for immunization: Secondary | ICD-10-CM

## 2021-07-15 DIAGNOSIS — E785 Hyperlipidemia, unspecified: Secondary | ICD-10-CM

## 2021-07-15 MED ORDER — ASPIRIN EC 325 MG PO TBEC: 325.0000 mg | DELAYED_RELEASE_TABLET | Freq: Every day | ORAL | 0 refills | Status: DC

## 2021-07-15 MED ORDER — AMLODIPINE BESYLATE 5 MG PO TABS: 5.0000 mg | ORAL_TABLET | Freq: Every day | ORAL | 1 refills | Status: DC

## 2021-07-15 NOTE — Progress Notes (Signed)
Subjective:  Patient ID: Mario [name], male    DOB: 07/22/1975  Age: 46 y.o. 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 and has been referred to Dr Randie Heinz vascular surgeon who patient has seen for visit - he had carotids evaluated which showed minimal stenosis bilaterally --- was advised to continue ASA and statin and repeat duplex carotid in 5 years - he was supposed to be on Plavix as well but pt stopped that medication due to fact it made him feel bad and will not restart that med or anything like it -- states he is agreeable to try ASA 325mg  however  Pt with history of hyperlipidemia - currently on lipitor 40mg  qd and omega 3  Pt would like to update tetanus booster today Current Outpatient Medications on File Prior to Visit  Medication Sig Dispense Refill   atorvastatin (LIPITOR) 40 MG tablet TAKE 1 TABLET(40 MG) BY MOUTH DAILY 90 tablet 0   loratadine (CLARITIN) 10 MG tablet TK 1 T PO QD FOR ALLERGIES  5   losartan-hydrochlorothiazide (HYZAAR) 100-12.5 MG tablet Take 1 tablet by mouth daily. 90 tablet 1   Multiple Vitamins-Minerals (MULTIVITAMIN ADULT) TABS Take 1 tablet by mouth daily.     Omega 3 1000 MG CAPS Take 0.1 capsules (100 mg total) by mouth daily. 90 each 1   No current facility-administered medications on file prior to visit.   Past Medical History:  Diagnosis Date   Hypertension    Stroke [facility])    Past Surgical  History:  Procedure Laterality Date   NO PAST SURGERIES      Family History  Problem Relation Age of Onset   Stroke Maternal Grandmother    Social History   Socioeconomic History   Marital status: Married    Spouse name: Not on file   Number of children: Not on file   Years of education: Not on file   Highest education level: Not on file  Occupational History   Not on file  Tobacco Use   Smoking status: Never   Smokeless tobacco: Never  Vaping Use   Vaping Use: Never used  Substance and Sexual Activity   Alcohol use: No   Drug use: No   Sexual activity: Not on file  Other Topics Concern   Not on file  Social History Narrative   Not on file   Social Determinants of Health   Financial Resource Strain: Not on file  Food Insecurity: Not on file  Transportation Needs: Not on file  Physical Activity: Not on file  Stress: Not on file  Social Connections: Not on file   CONSTITUTIONAL: Negative for chills, fatigue, fever, unintentional weight gain and unintentional weight loss.  E/N/T: Negative for ear pain, nasal congestion and sore throat.  CARDIOVASCULAR: Negative for chest pain, dizziness, palpitations and pedal edema.  RESPIRATORY: Negative for  recent cough and dyspnea.  GASTROINTESTINAL: Negative for abdominal pain, acid reflux symptoms, constipation, diarrhea, nausea and vomiting.        Objective:  PHYSICAL EXAM:   VS: BP (!) 122/98 (BP Location: Right Arm, Patient Position: Sitting)    Pulse 74    Temp 98.9 F (37.2 C) (Oral)    Ht 6' (1.829 m)    Wt 224 lb (101.6 kg)    SpO2 97%    BMI 30.38 kg/m   GEN: Well nourished, well developed, in no acute distress  Cardiac: RRR; no murmurs, rubs, or gallops,no edema -  Respiratory:  normal respiratory rate and pattern with no distress - normal breath sounds with no rales, rhonchi, wheezes or rubs Skin: warm and dry, no rash  Psych: euthymic mood, appropriate affect and demeanor  Lab Results  Component Value  Date   WBC 7.2 09/16/2020   HGB 15.9 09/16/2020   HCT 45.5 09/16/2020   PLT 206 09/16/2020   GLUCOSE 86 03/24/2021   CHOL 172 09/16/2020   TRIG 54 09/16/2020   HDL 58 09/16/2020   LDLCALC 103 (H) 09/16/2020   ALT 26 03/24/2021   AST 25 03/24/2021   NA 140 03/24/2021   K 4.0 03/24/2021   CL 97 03/24/2021   CREATININE 1.37 (H) 03/24/2021   BUN 10 03/24/2021   CO2 27 03/24/2021   TSH 1.190 09/16/2020   INR 1.13 10/22/2014   HGBA1C 5.2 11/16/2015      Assessment & Plan:   Problem List Items Addressed This Visit       Cardiovascular and Mediastinum   Left middle cerebral artery stroke (HCC)   Relevant Orders   Increase ASA to 325mg  since he will not take plavix   Essential hypertension - Primary Continue current meds and start norvasc 5mg  qd also!     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  Meds ordered this encounter  Medications   amLODipine (NORVASC) 5 MG tablet    Sig: Take 1 tablet (5 mg total) by mouth daily.    Dispense:  90 tablet    Refill:  1    Order Specific Question:   Supervising Provider    Answer:     aspirin EC 325 MG tablet    Sig: Take 1 tablet (325 mg total) by mouth daily.    Dispense:  30 tablet    Refill:  0    Order Specific Question:   Supervising Provider    Answer [phone]    Orders Placed This Encounter  Procedures   Tdap vaccine greater than or equal to 7yo IM   CBC with Differential/Platelet   Comprehensive metabolic panel   Lipid panel     Follow-up: Return in about 4 months (around 11/12/2021) for chronic fasting --- 3 weeks nurse visit bp check.  An After Visit Summary was printed and given to the patient.  [[id number]] [facility] [phone]

## 2021-07-16 LAB — LIPID PANEL
Chol/HDL Ratio: 3.6 ratio (ref 0.0–5.0)
Cholesterol, Total: 185 mg/dL (ref 100–199)
HDL: 51 mg/dL (ref >39)
LDL Chol Calc (NIH): 115 mg/dL — ABNORMAL HIGH (ref 0–99)
Triglycerides: 107 mg/dL (ref 0–149)
VLDL Cholesterol Cal: 19 mg/dL (ref 5–40)

## 2021-07-16 LAB — CARDIOVASCULAR RISK ASSESSMENT

## 2021-07-16 LAB — COMPREHENSIVE METABOLIC PANEL
ALT: 36 IU/L (ref 0–44)
AST: 36 IU/L (ref 0–40)
Albumin/Globulin Ratio: 1.8 (ref 1.2–2.2)
Albumin: 4.8 g/dL (ref 4.0–5.0)
Alkaline Phosphatase: 65 IU/L (ref 44–121)
BUN/Creatinine Ratio: 10 (ref 9–20)
BUN: 16 mg/dL (ref 6–24)
Bilirubin Total: 1.3 mg/dL — ABNORMAL HIGH (ref 0.0–1.2)
CO2: 29 mmol/L (ref 20–29)
Calcium: 10.4 mg/dL — ABNORMAL HIGH (ref 8.7–10.2)
Chloride: 98 mmol/L (ref 96–106)
Creatinine, Ser: 1.55 mg/dL — ABNORMAL HIGH (ref 0.76–1.27)
Globulin, Total: 2.7 g/dL (ref 1.5–4.5)
Glucose: 86 mg/dL (ref 70–99)
Potassium: 4.7 mmol/L (ref 3.5–5.2)
Sodium: 138 mmol/L (ref 134–144)
Total Protein: 7.5 g/dL (ref 6.0–8.5)
eGFR: 55 mL/min/{1.73_m2} — ABNORMAL LOW (ref >59)

## 2021-07-16 LAB — CBC WITH DIFFERENTIAL/PLATELET
Basophils Absolute: 0 10*3/uL (ref 0.0–0.2)
Basos: 1 %
EOS (ABSOLUTE): 0.2 10*3/uL (ref 0.0–0.4)
Eos: 3 %
Hematocrit: 46.2 % (ref 37.5–51.0)
Hemoglobin: 16.2 g/dL (ref 13.0–17.7)
Immature Grans (Abs): 0 10*3/uL (ref 0.0–0.1)
Immature Granulocytes: 0 %
Lymphocytes Absolute: 2.2 10*3/uL (ref 0.7–3.1)
Lymphs: 35 %
MCH: 32.1 pg (ref 26.6–33.0)
MCHC: 35.1 g/dL (ref 31.5–35.7)
MCV: 92 fL (ref 79–97)
Monocytes Absolute: 0.4 10*3/uL (ref 0.1–0.9)
Monocytes: 7 %
Neutrophils Absolute: 3.4 10*3/uL (ref 1.4–7.0)
Neutrophils: 54 %
Platelets: 198 10*3/uL (ref 150–450)
RBC: 5.05 x10E6/uL (ref 4.14–5.80)
RDW: 14.3 % (ref 11.6–15.4)
WBC: 6.2 10*3/uL (ref 3.4–10.8)

## 2021-08-05 ENCOUNTER — Other Ambulatory Visit: Payer: Self-pay

## 2021-08-05 ENCOUNTER — Ambulatory Visit: Payer: Medicare Other

## 2021-09-01 ENCOUNTER — Encounter: Payer: Medicare Other | Attending: Physical Medicine & Rehabilitation | Admitting: Physical Medicine & Rehabilitation

## 2021-09-01 ENCOUNTER — Encounter: Payer: Self-pay | Admitting: Physical Medicine & Rehabilitation

## 2021-09-01 ENCOUNTER — Other Ambulatory Visit: Payer: Self-pay | Admitting: Physician Assistant

## 2021-09-01 VITALS — BP 143/93 | HR 78 | Ht 72.0 in | Wt 223.6 lb

## 2021-09-01 DIAGNOSIS — I10 Essential (primary) hypertension: Secondary | ICD-10-CM

## 2021-09-01 DIAGNOSIS — G8111 Spastic hemiplegia affecting right dominant side: Secondary | ICD-10-CM | POA: Diagnosis not present

## 2021-09-01 DIAGNOSIS — I63512 Cerebral infarction due to unspecified occlusion or stenosis of left middle cerebral artery: Secondary | ICD-10-CM

## 2021-09-01 NOTE — Progress Notes (Signed)
Dysport Injection for spasticity using needle EMG guidance  Dilution: 200 Units/ml Indication: Severe spasticity which interferes with ADL,mobility and/or  hygiene and is unresponsive to medication management and other conservative care Informed consent was obtained after describing risks and benefits of the procedure with the patient. This includes bleeding, bruising, infection, excessive weakness, or medication side effects. A REMS form is on file and signed. Needle: 25g 2" needle electrode Number of units per muscle Right lower ext FDL 200 EHL 100 Post tib 200  All injections were done after obtaining appropriate EMG activity and after negative drawback for blood. The patient tolerated the procedure well. Post procedure instructions were given. A followup appointment was made.  

## 2021-10-09 ENCOUNTER — Other Ambulatory Visit: Payer: Self-pay | Admitting: Physician Assistant

## 2021-10-27 ENCOUNTER — Other Ambulatory Visit: Payer: Self-pay | Admitting: Physician Assistant

## 2021-10-27 ENCOUNTER — Telehealth: Payer: Self-pay

## 2021-10-27 DIAGNOSIS — E785 Hyperlipidemia, unspecified: Secondary | ICD-10-CM

## 2021-10-27 DIAGNOSIS — I10 Essential (primary) hypertension: Secondary | ICD-10-CM

## 2021-10-27 NOTE — Telephone Encounter (Signed)
Attempted to call patient to schedule appointment. Patient is in need of fasting follow up. Four Month fasting previously scheduled needs to be rescheduled.   Lorita Officer, CCMA 10/27/21 10:10 AM

## 2021-11-03 ENCOUNTER — Other Ambulatory Visit: Payer: Self-pay | Admitting: Physician Assistant

## 2021-11-03 DIAGNOSIS — E785 Hyperlipidemia, unspecified: Secondary | ICD-10-CM

## 2021-11-18 ENCOUNTER — Ambulatory Visit: Payer: Medicare Other | Admitting: Physician Assistant

## 2021-12-01 ENCOUNTER — Ambulatory Visit (INDEPENDENT_AMBULATORY_CARE_PROVIDER_SITE_OTHER): Payer: Medicare Other | Admitting: Physician Assistant

## 2021-12-01 ENCOUNTER — Encounter: Payer: Self-pay | Admitting: Physician Assistant

## 2021-12-01 VITALS — BP 158/92 | HR 88 | Temp 97.8°F | Ht 72.0 in | Wt 227.0 lb

## 2021-12-01 DIAGNOSIS — I69398 Other sequelae of cerebral infarction: Secondary | ICD-10-CM

## 2021-12-01 DIAGNOSIS — G8110 Spastic hemiplegia affecting unspecified side: Secondary | ICD-10-CM

## 2021-12-01 DIAGNOSIS — E785 Hyperlipidemia, unspecified: Secondary | ICD-10-CM | POA: Diagnosis not present

## 2021-12-01 DIAGNOSIS — I10 Essential (primary) hypertension: Secondary | ICD-10-CM | POA: Diagnosis not present

## 2021-12-01 DIAGNOSIS — I69390 Apraxia following cerebral infarction: Secondary | ICD-10-CM | POA: Diagnosis not present

## 2021-12-01 DIAGNOSIS — Z8673 Personal history of transient ischemic attack (TIA), and cerebral infarction without residual deficits: Secondary | ICD-10-CM | POA: Diagnosis not present

## 2021-12-01 DIAGNOSIS — R269 Unspecified abnormalities of gait and mobility: Secondary | ICD-10-CM

## 2021-12-01 DIAGNOSIS — I69320 Aphasia following cerebral infarction: Secondary | ICD-10-CM | POA: Diagnosis not present

## 2021-12-01 DIAGNOSIS — I69359 Hemiplegia and hemiparesis following cerebral infarction affecting unspecified side: Secondary | ICD-10-CM

## 2021-12-01 MED ORDER — AMLODIPINE BESYLATE 10 MG PO TABS
10.0000 mg | ORAL_TABLET | Freq: Every day | ORAL | 0 refills | Status: DC
Start: 2021-12-01 — End: 2022-01-03

## 2021-12-01 MED ORDER — LOSARTAN POTASSIUM-HCTZ 100-12.5 MG PO TABS: 1.0000 | ORAL_TABLET | Freq: Every day | ORAL | 0 refills | Status: DC

## 2021-12-01 NOTE — Progress Notes (Signed)
Subjective:  Patient ID: Mario [name], male    DOB: 06/01/1976  Age: 46 y.o. 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  He has mild right sided hemiparesis which has improved with therapy and botox therapy.  He follows up next month for next botox injection series Pt also with aphasia as late effect of CVA - speech has slowly improved over the years Current Outpatient Medications on File Prior to Visit  Medication Sig Dispense Refill   aspirin EC 325 MG tablet 1 po qd 90 tablet 0   atorvastatin (LIPITOR) 40 MG tablet TAKE 1 TABLET(40 MG) BY MOUTH DAILY 90 tablet 0   loratadine (CLARITIN) 10 MG tablet TK 1 T PO QD FOR ALLERGIES  5   meclizine (ANTIVERT) 25 MG tablet TAKE 1 TABLET(25 MG) BY MOUTH THREE TIMES DAILY AS NEEDED 30 tablet 1   Multiple Vitamins-Minerals (MULTIVITAMIN ADULT) TABS Take 1 tablet by mouth daily.     Omega 3 1000 MG CAPS Take 0.1 capsules (100 mg total) by mouth daily. 90 each 1   No current facility-administered medications on file prior to visit.   Past Medical History:  Diagnosis Date   Hypertension    Stroke [facility])    Past Surgical History:   Procedure Laterality Date   NO PAST SURGERIES      Family History  Problem Relation Age of Onset   Stroke Maternal Grandmother    Social History   Socioeconomic History   Marital status: Married    Spouse name: Not on file   Number of children: Not on file   Years of education: Not on file   Highest education level: Not on file  Occupational History   Not on file  Tobacco Use   Smoking status: Never   Smokeless tobacco: Never  Vaping Use   Vaping Use: Never used  Substance and Sexual Activity   Alcohol use: No   Drug use: No   Sexual activity: Not on file  Other Topics Concern   Not on file  Social History Narrative   Not on file   Social Determinants of Health   Financial Resource Strain: Not on file  Food Insecurity: Not on file  Transportation Needs: Not on file  Physical Activity: Not on file  Stress: Not on file  Social Connections: Not on file   CONSTITUTIONAL: Negative for chills, fatigue, fever, unintentional weight gain and unintentional weight loss.  E/N/T: Negative for ear pain, nasal congestion and sore throat.  CARDIOVASCULAR: Negative for chest pain, dizziness, palpitations and pedal edema.  RESPIRATORY:  Negative for recent cough and dyspnea.  GASTROINTESTINAL: Negative for abdominal pain, acid reflux symptoms, constipation, diarrhea, nausea and vomiting.  MSK: see HPI INTEGUMENTARY: Negative for rash.  NEUROLOGICAL:see HPI PSYCHIATRIC: Negative for sleep disturbance and to question depression screen.  Negative for depression, negative for anhedonia.       Objective:  PHYSICAL EXAM:   VS: BP (!) 158/92 (BP Location: Left Arm, Patient Position: Sitting)   Pulse 88   Temp 97.8 F (36.6 C) (Temporal)   Ht 6' (1.829 m)   Wt 227 lb (103 kg)   SpO2 97%   BMI 30.79 kg/m   GEN: Well nourished, well developed, in no acute distress   Oropharynx - normal mucosa, palate, and posterior pharynx Neck: no JVD or masses - no thyromegaly Cardiac:  RRR; no murmurs, rubs, or gallops,no edema -  Respiratory:  normal respiratory rate and pattern with no distress - normal breath sounds with no rales, rhonchi, wheezes or rubs  MS: no deformity - mild decreased rom on right - gait abnormality noted Skin: warm and dry, no rash  Neuro:  Alert and Oriented x 3, speech with some difficulty Psych: euthymic mood, appropriate affect and demeanor  Lab Results  Component Value Date   WBC 6.2 07/15/2021   HGB 16.2 07/15/2021   HCT 46.2 07/15/2021   PLT 198 07/15/2021   GLUCOSE 86 07/15/2021   CHOL 185 07/15/2021   TRIG 107 07/15/2021   HDL 51 07/15/2021   LDLCALC 115 (H) 07/15/2021   ALT 36 07/15/2021   AST 36 07/15/2021   NA 138 07/15/2021   K 4.7 07/15/2021   CL 98 07/15/2021   CREATININE 1.55 (H) 07/15/2021   BUN 16 07/15/2021   CO2 29 07/15/2021   TSH 1.190 09/16/2020   INR 1.13 10/22/2014   HGBA1C 5.2 11/16/2015      Assessment & Plan:   Problem List Items Addressed This Visit       Cardiovascular and Mediastinum   Left middle cerebral artery stroke (HCC)   Relevant Orders   Increase ASA to 325mg  since he will not take plavix   Essential hypertension - Primary Continue current meds and increase norvasc to 10mg  qd     Nervous and Auditory   Spastic hemiparesis (HCC)   Continue follow up       Hemiparesis and aphasia as late effect of cerebrovascular accident (CVA) (HCC)   Relevant Orders   Continue follow up with specialists        .  Meds ordered this encounter  Medications   losartan-hydrochlorothiazide (HYZAAR) 100-12.5 MG tablet    Sig: Take 1 tablet by mouth daily.    Dispense:  90 tablet    Refill:  0    Order Specific Question:   Supervising Provider    Answer:     amLODipine (NORVASC) 10 MG tablet    Sig: Take 1 tablet (10 mg total) by mouth daily.    Dispense:  90 tablet    Refill:  0    Order Specific Question:   Supervising Provider    Answer     Orders Placed This Encounter  Procedures   CBC with Differential/Platelet   Comprehensive metabolic panel   TSH   Lipid panel     Follow-up: Return in about 4 months (around 04/02/2022) for chronic fasting and in 3 weeks for bp check nurse visit.  An After Visit Summary was printed and given to  the patient.  Yetta Flock [facility] [phone]

## 2021-12-02 LAB — CBC WITH DIFFERENTIAL/PLATELET
Basophils Absolute: 0 10*3/uL (ref 0.0–0.2)
Basos: 1 %
EOS (ABSOLUTE): 0.2 10*3/uL (ref 0.0–0.4)
Eos: 3 %
Hematocrit: 45.8 % (ref 37.5–51.0)
Hemoglobin: 15.9 g/dL (ref 13.0–17.7)
Immature Grans (Abs): 0 10*3/uL (ref 0.0–0.1)
Immature Granulocytes: 0 %
Lymphocytes Absolute: 2 10*3/uL (ref 0.7–3.1)
Lymphs: 36 %
MCH: 31.7 pg (ref 26.6–33.0)
MCHC: 34.7 g/dL (ref 31.5–35.7)
MCV: 91 fL (ref 79–97)
Monocytes Absolute: 0.3 10*3/uL (ref 0.1–0.9)
Monocytes: 6 %
Neutrophils Absolute: 3 10*3/uL (ref 1.4–7.0)
Neutrophils: 54 %
Platelets: 182 10*3/uL (ref 150–450)
RBC: 5.02 x10E6/uL (ref 4.14–5.80)
RDW: 14.2 % (ref 11.6–15.4)
WBC: 5.6 10*3/uL (ref 3.4–10.8)

## 2021-12-02 LAB — LIPID PANEL
Chol/HDL Ratio: 3 ratio (ref 0.0–5.0)
Cholesterol, Total: 179 mg/dL (ref 100–199)
HDL: 60 mg/dL (ref >39)
LDL Chol Calc (NIH): 100 mg/dL — ABNORMAL HIGH (ref 0–99)
Triglycerides: 107 mg/dL (ref 0–149)
VLDL Cholesterol Cal: 19 mg/dL (ref 5–40)

## 2021-12-02 LAB — COMPREHENSIVE METABOLIC PANEL
ALT: 41 IU/L (ref 0–44)
AST: 39 IU/L (ref 0–40)
Albumin/Globulin Ratio: 1.6 (ref 1.2–2.2)
Albumin: 4.8 g/dL (ref 4.0–5.0)
Alkaline Phosphatase: 61 IU/L (ref 44–121)
BUN/Creatinine Ratio: 6 — ABNORMAL LOW (ref 9–20)
BUN: 9 mg/dL (ref 6–24)
Bilirubin Total: 1.1 mg/dL (ref 0.0–1.2)
CO2: 25 mmol/L (ref 20–29)
Calcium: 10.2 mg/dL (ref 8.7–10.2)
Chloride: 101 mmol/L (ref 96–106)
Creatinine, Ser: 1.59 mg/dL — ABNORMAL HIGH (ref 0.76–1.27)
Globulin, Total: 3 g/dL (ref 1.5–4.5)
Glucose: 107 mg/dL — ABNORMAL HIGH (ref 70–99)
Potassium: 4.2 mmol/L (ref 3.5–5.2)
Sodium: 140 mmol/L (ref 134–144)
Total Protein: 7.8 g/dL (ref 6.0–8.5)
eGFR: 54 mL/min/{1.73_m2} — ABNORMAL LOW (ref >59)

## 2021-12-02 LAB — CARDIOVASCULAR RISK ASSESSMENT

## 2021-12-02 LAB — TSH: TSH: 1.4 u[IU]/mL (ref 0.450–4.500)

## 2021-12-15 ENCOUNTER — Encounter: Payer: Self-pay | Admitting: Physical Medicine & Rehabilitation

## 2021-12-15 ENCOUNTER — Encounter: Payer: Medicare Other | Attending: Physical Medicine & Rehabilitation | Admitting: Physical Medicine & Rehabilitation

## 2021-12-15 VITALS — BP 136/91 | HR 85 | Ht 72.0 in | Wt 229.4 lb

## 2021-12-15 DIAGNOSIS — G8111 Spastic hemiplegia affecting right dominant side: Secondary | ICD-10-CM | POA: Diagnosis not present

## 2021-12-15 NOTE — Patient Instructions (Signed)

## 2021-12-15 NOTE — Progress Notes (Signed)
Dysport Injection for spasticity using needle EMG guidance  Dilution: 200 Units/ml Indication: Severe spasticity which interferes with ADL,mobility and/or  hygiene and is unresponsive to medication management and other conservative care Informed consent was obtained after describing risks and benefits of the procedure with the patient. This includes bleeding, bruising, infection, excessive weakness, or medication side effects. A REMS form is on file and signed. Needle: 25g 2" needle electrode Number of units per muscle Right lower ext FDL 200 EHL 100 Post tib 200  All injections were done after obtaining appropriate EMG activity and after negative drawback for blood. The patient tolerated the procedure well. Post procedure instructions were given. A followup appointment was made.  

## 2021-12-22 ENCOUNTER — Ambulatory Visit: Payer: Medicare Other

## 2021-12-22 NOTE — Progress Notes (Signed)
Patient came in for blood pressure check. Allowed patient to leave and informed him should there be any changes made to his medications we will contact him.

## 2022-01-02 ENCOUNTER — Other Ambulatory Visit: Payer: Self-pay | Admitting: Physician Assistant

## 2022-01-02 DIAGNOSIS — I10 Essential (primary) hypertension: Secondary | ICD-10-CM

## 2022-01-03 ENCOUNTER — Other Ambulatory Visit: Payer: Self-pay | Admitting: Physician Assistant

## 2022-01-03 DIAGNOSIS — I10 Essential (primary) hypertension: Secondary | ICD-10-CM

## 2022-01-03 MED ORDER — AMLODIPINE BESYLATE 10 MG PO TABS: 10.0000 mg | ORAL_TABLET | Freq: Every day | ORAL | 0 refills | Status: DC

## 2022-02-13 ENCOUNTER — Telehealth: Payer: Self-pay

## 2022-02-13 NOTE — Patient Outreach (Signed)
  Care Coordination   02/13/2022 Name: Mario [name] MRN: [id number] DOB: Mar 01, 1976   Care Coordination Outreach Attempts:  An unsuccessful telephone outreach was attempted today to offer the patient information about available care coordination services as a benefit of their health plan.   Follow Up Plan:  Additional outreach attempts will be made to offer the patient care coordination information and services.   Encounter Outcome:  No Answer  Care Coordination Interventions Activated:  No   Care Coordination Interventions:  No, not indicated    Rowe Pavy, RN, BSN, CEN [facility] [employer] [phone]

## 2022-03-01 ENCOUNTER — Telehealth: Payer: Self-pay

## 2022-03-01 NOTE — Patient Outreach (Signed)
  Care Coordination   03/01/2022 Name: Mario [name] MRN: [id number] DOB: 1976/02/04   Care Coordination Outreach Attempts:  A second unsuccessful outreach was attempted today to offer the patient with information about available care coordination services as a benefit of their health plan.     Follow Up Plan:  Additional outreach attempts will be made to offer the patient care coordination information and services.   Encounter Outcome:  No Answer  Care Coordination Interventions Activated:  No   Care Coordination Interventions:  No, not indicated    Tomasa Rand, RN, BSN, CEN [facility] [employer] [phone]

## 2022-03-07 ENCOUNTER — Telehealth: Payer: Self-pay

## 2022-03-07 NOTE — Patient Outreach (Signed)
  Care Coordination   03/07/2022 Name: Mario [name] MRN: [id number] DOB: 01-31-1976   Care Coordination Outreach Attempts:  A third unsuccessful outreach was attempted today to offer the patient with information about available care coordination services as a benefit of their health plan.   Follow Up Plan:  No further outreach attempts will be made at this time. We have been unable to contact the patient to offer or enroll patient in care coordination services  Encounter Outcome:  No Answer  Care Coordination Interventions Activated:  No   Care Coordination Interventions:  No, not indicated    Tomasa Rand, RN, BSN, CEN [place] [facility] Coordinator [phone]

## 2022-03-23 ENCOUNTER — Encounter: Payer: Medicare Other | Attending: Physical Medicine & Rehabilitation | Admitting: Physical Medicine & Rehabilitation

## 2022-03-23 ENCOUNTER — Encounter: Payer: Self-pay | Admitting: Physical Medicine & Rehabilitation

## 2022-03-23 VITALS — BP 130/91 | HR 75 | Ht 72.0 in | Wt 232.8 lb

## 2022-03-23 DIAGNOSIS — G8111 Spastic hemiplegia affecting right dominant side: Secondary | ICD-10-CM | POA: Insufficient documentation

## 2022-03-23 MED ORDER — SODIUM CHLORIDE (PF) 0.9 % IJ SOLN
2.5000 mL | Freq: Once | INTRAMUSCULAR | Status: AC
  Administered 2022-03-23: 2.5 mL

## 2022-03-23 MED ORDER — ABOBOTULINUMTOXINA 500 UNITS IM SOLR
500.0000 [IU] | Freq: Once | INTRAMUSCULAR | Status: AC
  Administered 2022-03-23: 500 [IU] via INTRAMUSCULAR

## 2022-03-23 NOTE — Progress Notes (Signed)
Dysport Injection for spasticity using needle EMG guidance  Dilution: 200 Units/ml Indication: Severe spasticity which interferes with ADL,mobility and/or  hygiene and is unresponsive to medication management and other conservative care Informed consent was obtained after describing risks and benefits of the procedure with the patient. This includes bleeding, bruising, infection, excessive weakness, or medication side effects. A REMS form is on file and signed. Needle: 25g 2" needle electrode Number of units per muscle Right lower ext FDL 200 EHL 100 Post tib 200  All injections were done after obtaining appropriate EMG activity and after negative drawback for blood. The patient tolerated the procedure well. Post procedure instructions were given. A followup appointment was made.  

## 2022-03-23 NOTE — Patient Instructions (Signed)

## 2022-04-02 ENCOUNTER — Other Ambulatory Visit: Payer: Self-pay | Admitting: Physician Assistant

## 2022-04-02 DIAGNOSIS — I10 Essential (primary) hypertension: Secondary | ICD-10-CM

## 2022-04-03 ENCOUNTER — Encounter: Payer: Self-pay | Admitting: Physician Assistant

## 2022-04-03 ENCOUNTER — Ambulatory Visit (INDEPENDENT_AMBULATORY_CARE_PROVIDER_SITE_OTHER): Payer: Medicare Other | Admitting: Physician Assistant

## 2022-04-03 VITALS — BP 124/86 | HR 86 | Temp 98.0°F | Ht 72.0 in | Wt 231.0 lb

## 2022-04-03 DIAGNOSIS — I69359 Hemiplegia and hemiparesis following cerebral infarction affecting unspecified side: Secondary | ICD-10-CM | POA: Diagnosis not present

## 2022-04-03 DIAGNOSIS — I69398 Other sequelae of cerebral infarction: Secondary | ICD-10-CM | POA: Diagnosis not present

## 2022-04-03 DIAGNOSIS — Z23 Encounter for immunization: Secondary | ICD-10-CM | POA: Diagnosis not present

## 2022-04-03 DIAGNOSIS — I10 Essential (primary) hypertension: Secondary | ICD-10-CM | POA: Diagnosis not present

## 2022-04-03 DIAGNOSIS — G8110 Spastic hemiplegia affecting unspecified side: Secondary | ICD-10-CM | POA: Diagnosis not present

## 2022-04-03 DIAGNOSIS — R269 Unspecified abnormalities of gait and mobility: Secondary | ICD-10-CM | POA: Diagnosis not present

## 2022-04-03 DIAGNOSIS — Z8673 Personal history of transient ischemic attack (TIA), and cerebral infarction without residual deficits: Secondary | ICD-10-CM | POA: Diagnosis not present

## 2022-04-03 DIAGNOSIS — E785 Hyperlipidemia, unspecified: Secondary | ICD-10-CM

## 2022-04-03 DIAGNOSIS — I69320 Aphasia following cerebral infarction: Secondary | ICD-10-CM

## 2022-04-03 MED ORDER — LOSARTAN POTASSIUM-HCTZ 100-12.5 MG PO TABS: 1.0000 | ORAL_TABLET | Freq: Every day | ORAL | 5 refills | Status: DC

## 2022-04-03 MED ORDER — NEBIVOLOL HCL 5 MG PO TABS: 5.0000 mg | ORAL_TABLET | Freq: Every day | ORAL | 2 refills | Status: DC

## 2022-04-03 NOTE — Progress Notes (Signed)
Subjective:  Patient ID: Mario [name], male    DOB: [birth year]  Age: 46 y.o. 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  He has mild right sided hemiparesis which has improved with therapy and botox therapy.  He continues with botox injection Pt also with aphasia as late effect of CVA - speech has slowly improved over the years  Pt requests flu shot today Current Outpatient Medications on File Prior to Visit  Medication Sig Dispense Refill   amLODipine (NORVASC) 10 MG tablet TAKE 1 TABLET(10 MG) BY MOUTH DAILY 90 tablet 0   aspirin EC 325 MG tablet 1 po qd 90 tablet 0   atorvastatin (LIPITOR) 40 MG tablet TAKE 1 TABLET(40 MG) BY MOUTH DAILY 90 tablet 0   loratadine (CLARITIN) 10 MG tablet TK 1 T PO QD FOR ALLERGIES  5   meclizine (ANTIVERT) 25 MG tablet TAKE 1 TABLET(25 MG) BY MOUTH THREE TIMES DAILY AS NEEDED 30 tablet 1   Multiple Vitamins-Minerals (MULTIVITAMIN ADULT) TABS Take 1 tablet by mouth daily.     Omega 3 1000 MG CAPS Take 0.1 capsules (100 mg total) by mouth daily. 90 each 1   No current facility-administered medications on file prior to visit.   Past Medical History:  Diagnosis  Date   Hypertension    Stroke [facility])    Past Surgical History:  Procedure Laterality Date   NO PAST SURGERIES      Family History  Problem Relation Age of Onset   Stroke Maternal Grandmother    Social History   Socioeconomic History   Marital status: Married    Spouse name: Not on file   Number of children: Not on file   Years of education: Not on file   Highest education level: Not on file  Occupational History   Not on file  Tobacco Use   Smoking status: Never   Smokeless tobacco: Never  Vaping Use   Vaping Use: Never used  Substance and Sexual Activity   Alcohol use: No   Drug use: No   Sexual activity: Not on file  Other Topics Concern   Not on file  Social History Narrative   Not on file   Social Determinants of Health   Financial Resource Strain: Not on file  Food Insecurity: Not on file  Transportation Needs: Not on file  Physical Activity: Not on file  Stress: Not on file  Social Connections: Not on file   CONSTITUTIONAL: Negative for chills, fatigue, fever, unintentional weight gain and unintentional weight loss.  E/N/T: Negative for ear pain, nasal congestion and sore throat.  CARDIOVASCULAR: Negative  for chest pain, dizziness, palpitations and pedal edema.  RESPIRATORY: Negative for recent cough and dyspnea.  GASTROINTESTINAL: Negative for abdominal pain, acid reflux symptoms, constipation, diarrhea, nausea and vomiting.  MSK: see HPI INTEGUMENTARY: Negative for rash.  NEUROLOGICAL: see HPI PSYCHIATRIC: Negative for sleep disturbance and to question depression screen.  Negative for depression, negative for anhedonia.       Objective:  PHYSICAL EXAM:   VS: BP 124/86   Pulse 86   Temp 98 F (36.7 C) (Temporal)   Ht 6' (1.829 m)   Wt 231 lb (104.8 kg)   BMI 31.33 kg/m   GEN: Well nourished, well developed, in no acute distress  HEENT: normal external ears and nose - normal external auditory canals and TMS - hearing grossly normal - normal  nasal mucosa and septum - Lips, Teeth and Gums - normal  Oropharynx - normal mucosa, palate, and posterior pharynx Cardiac: RRR; no murmurs, rubs, or gallops,no edema -  Respiratory:  normal respiratory rate and pattern with no distress - normal breath sounds with no rales, rhonchi, wheezes or rubs  MS: decreased rom right extremities but is walking well without assistance - regaining strength in right arm Skin: warm and dry, no rash  Neuro:  Alert and Oriented x 3, aphasia noted Psych: euthymic mood, appropriate affect and demeanor   Lab Results  Component Value Date   WBC 5.6 12/01/2021   HGB 15.9 12/01/2021   HCT 45.8 12/01/2021   PLT 182 12/01/2021   GLUCOSE 107 (H) 12/01/2021   CHOL 179 12/01/2021   TRIG 107 12/01/2021   HDL 60 12/01/2021   LDLCALC 100 (H) 12/01/2021   ALT 41 12/01/2021   AST 39 12/01/2021   NA 140 12/01/2021   K 4.2 12/01/2021   CL 101 12/01/2021   CREATININE 1.59 (H) 12/01/2021   BUN 9 12/01/2021   CO2 25 12/01/2021   TSH 1.400 12/01/2021   INR 1.13 10/22/2014   HGBA1C 5.2 11/16/2015      Assessment & Plan:   Problem List Items Addressed This Visit       Cardiovascular and Mediastinum   Left middle cerebral artery stroke (HCC)   Relevant Orders   Increase ASA to 325mg  since he will not take plavix   Essential hypertension - Primary Continue meds as directed Add bystolic 5mg      Nervous and Auditory   Spastic hemiparesis (HCC)   Continue follow up       Hemiparesis and aphasia as late effect of cerebrovascular accident (CVA) ([place])   Relevant Orders   Continue follow up with specialists   Need flu vaccine Fluarix given       .  Meds ordered this encounter  Medications   losartan-hydrochlorothiazide (HYZAAR) 100-12.5 MG tablet    Sig: Take 1 tablet by mouth daily.    Dispense:  30 tablet    Refill:  5    Order Specific Question:   Supervising Provider    Answer:   Shelton Silvas   nebivolol (BYSTOLIC) 5 MG tablet     Sig: Take 1 tablet (5 mg total) by mouth daily.    Dispense:  30 tablet    Refill:  2    Order Specific Question:   Supervising Provider    AnswerShelton Silvas    Orders Placed This Encounter  Procedures   Flu Vaccine QUAD 6+ mos PF IM (Fluarix Quad PF)   CBC with Differential/Platelet   Comprehensive metabolic panel  TSH   Lipid panel     Follow-up: Return for 4 months fasting with me ---- mcr wellness with me and bp check in next 2-3 weeks.  An After Visit Summary was printed and given to the patient.  Jettie Pagan [facility] [phone]

## 2022-04-04 ENCOUNTER — Other Ambulatory Visit: Payer: Self-pay | Admitting: Physician Assistant

## 2022-04-04 DIAGNOSIS — I10 Essential (primary) hypertension: Secondary | ICD-10-CM

## 2022-04-04 LAB — COMPREHENSIVE METABOLIC PANEL
ALT: 44 IU/L (ref 0–44)
AST: 47 IU/L — ABNORMAL HIGH (ref 0–40)
Albumin/Globulin Ratio: 1.6 (ref 1.2–2.2)
Albumin: 4.5 g/dL (ref 4.1–5.1)
Alkaline Phosphatase: 58 IU/L (ref 44–121)
BUN/Creatinine Ratio: 6 — ABNORMAL LOW (ref 9–20)
BUN: 11 mg/dL (ref 6–24)
Bilirubin Total: 0.8 mg/dL (ref 0.0–1.2)
CO2: 24 mmol/L (ref 20–29)
Calcium: 9.5 mg/dL (ref 8.7–10.2)
Chloride: 102 mmol/L (ref 96–106)
Creatinine, Ser: 1.72 mg/dL — ABNORMAL HIGH (ref 0.76–1.27)
Globulin, Total: 2.8 g/dL (ref 1.5–4.5)
Glucose: 106 mg/dL — ABNORMAL HIGH (ref 70–99)
Potassium: 4.1 mmol/L (ref 3.5–5.2)
Sodium: 140 mmol/L (ref 134–144)
Total Protein: 7.3 g/dL (ref 6.0–8.5)
eGFR: 49 mL/min/{1.73_m2} — ABNORMAL LOW (ref >59)

## 2022-04-04 LAB — CBC WITH DIFFERENTIAL/PLATELET
Basophils Absolute: 0 10*3/uL (ref 0.0–0.2)
Basos: 1 %
EOS (ABSOLUTE): 0.2 10*3/uL (ref 0.0–0.4)
Eos: 3 %
Hematocrit: 43.2 % (ref 37.5–51.0)
Hemoglobin: 15 g/dL (ref 13.0–17.7)
Immature Grans (Abs): 0 10*3/uL (ref 0.0–0.1)
Immature Granulocytes: 0 %
Lymphocytes Absolute: 2 10*3/uL (ref 0.7–3.1)
Lymphs: 33 %
MCH: 32.5 pg (ref 26.6–33.0)
MCHC: 34.7 g/dL (ref 31.5–35.7)
MCV: 94 fL (ref 79–97)
Monocytes Absolute: 0.4 10*3/uL (ref 0.1–0.9)
Monocytes: 6 %
Neutrophils Absolute: 3.3 10*3/uL (ref 1.4–7.0)
Neutrophils: 57 %
Platelets: 192 10*3/uL (ref 150–450)
RBC: 4.61 x10E6/uL (ref 4.14–5.80)
RDW: 13.9 % (ref 11.6–15.4)
WBC: 5.8 10*3/uL (ref 3.4–10.8)

## 2022-04-04 LAB — LIPID PANEL
Chol/HDL Ratio: 2.8 ratio (ref 0.0–5.0)
Cholesterol, Total: 145 mg/dL (ref 100–199)
HDL: 51 mg/dL (ref >39)
LDL Chol Calc (NIH): 76 mg/dL (ref 0–99)
Triglycerides: 99 mg/dL (ref 0–149)
VLDL Cholesterol Cal: 18 mg/dL (ref 5–40)

## 2022-04-04 LAB — CARDIOVASCULAR RISK ASSESSMENT

## 2022-04-04 LAB — TSH: TSH: 1.02 u[IU]/mL (ref 0.450–4.500)

## 2022-04-04 MED ORDER — LOSARTAN POTASSIUM 100 MG PO TABS: 100.0000 mg | ORAL_TABLET | Freq: Every day | ORAL | 0 refills | Status: DC

## 2022-04-19 ENCOUNTER — Encounter: Payer: Self-pay | Admitting: Physician Assistant

## 2022-04-19 ENCOUNTER — Ambulatory Visit (INDEPENDENT_AMBULATORY_CARE_PROVIDER_SITE_OTHER): Payer: Medicare Other | Admitting: Physician Assistant

## 2022-04-19 VITALS — BP 118/82 | HR 63 | Temp 97.4°F | Ht 72.0 in | Wt 230.0 lb

## 2022-04-19 DIAGNOSIS — R899 Unspecified abnormal finding in specimens from other organs, systems and tissues: Secondary | ICD-10-CM

## 2022-04-19 DIAGNOSIS — Z5320 Procedure and treatment not carried out because of patient's decision for unspecified reasons: Secondary | ICD-10-CM | POA: Diagnosis not present

## 2022-04-19 DIAGNOSIS — Z0000 Encounter for general adult medical examination without abnormal findings: Secondary | ICD-10-CM

## 2022-04-19 LAB — COMPREHENSIVE METABOLIC PANEL
ALT: 36 IU/L (ref 0–44)
AST: 45 IU/L — ABNORMAL HIGH (ref 0–40)
Albumin/Globulin Ratio: 1.5 (ref 1.2–2.2)
Albumin: 4.7 g/dL (ref 4.1–5.1)
Alkaline Phosphatase: 67 IU/L (ref 44–121)
BUN/Creatinine Ratio: 9 (ref 9–20)
BUN: 13 mg/dL (ref 6–24)
Bilirubin Total: 1.2 mg/dL (ref 0.0–1.2)
CO2: 24 mmol/L (ref 20–29)
Calcium: 10 mg/dL (ref 8.7–10.2)
Chloride: 102 mmol/L (ref 96–106)
Creatinine, Ser: 1.52 mg/dL — ABNORMAL HIGH (ref 0.76–1.27)
Globulin, Total: 3.1 g/dL (ref 1.5–4.5)
Glucose: 97 mg/dL (ref 70–99)
Potassium: 4.2 mmol/L (ref 3.5–5.2)
Sodium: 140 mmol/L (ref 134–144)
Total Protein: 7.8 g/dL (ref 6.0–8.5)
eGFR: 57 mL/min/{1.73_m2} — ABNORMAL LOW (ref >59)

## 2022-04-19 NOTE — Progress Notes (Signed)
Subjective:   Mario [name] is a 46 y.o. male who presents for an Initial Medicare Annual Wellness Visit.  Review of Systems    CONSTITUTIONAL: Negative for chills, fatigue, fever, unintentional weight gain and unintentional weight loss.  E/N/T: Negative for ear pain, nasal congestion and sore throat.  CARDIOVASCULAR: Negative for chest pain, dizziness, palpitations and pedal edema.  RESPIRATORY: Negative for recent cough and dyspnea.  GASTROINTESTINAL: Negative for abdominal pain, acid reflux symptoms, constipation, diarrhea, nausea and vomiting.  MSK: Negative for arthralgias and myalgias.  INTEGUMENTARY: Negative for rash.  NEUROLOGICAL: Negative for dizziness and headaches.  PSYCHIATRIC: Negative for sleep disturbance and to question depression screen.  Negative for depression, negative for anhedonia.      Cardiac Risk Factors include: none;advanced age (>52men, >61 women)     Objective:    Today's Vitals   04/19/22 1053  BP: 118/82  Pulse: 63  Temp: (!) 97.4 F (36.3 C)  TempSrc: Temporal  SpO2: 95%  Weight: 230 lb (104.3 kg)  Height: 6' (1.829 m)   Body mass index is 31.19 kg/m.     04/19/2022   11:06 AM 01/01/2017   11:31 AM 07/07/2016   12:24 PM 05/18/2016   11:53 AM 03/31/2016   11:07 AM 02/18/2016   10:41 AM 12/24/2015   11:00 AM  Advanced Directives  Does Patient Have a Medical Advance Directive? No Yes No No Yes Yes Yes  Type of Furniture conservator/restorer;Living will   Healthcare Power of [employer] of [place];Living will Living will;Healthcare Power of Attorney  Copy of Healthcare Power of Attorney in Chart?  No - copy requested   No - copy requested No - copy requested No - copy requested  Would patient like information on creating a medical advance directive? No - Patient declined          Current Medications (verified) Outpatient Encounter Medications as of 04/19/2022  Medication Sig   amLODipine (NORVASC) 10 MG  tablet TAKE 1 TABLET(10 MG) BY MOUTH DAILY   aspirin EC 325 MG tablet 1 po qd   atorvastatin (LIPITOR) 40 MG tablet TAKE 1 TABLET(40 MG) BY MOUTH DAILY   loratadine (CLARITIN) 10 MG tablet TK 1 T PO QD FOR ALLERGIES   losartan (COZAAR) 100 MG tablet Take 1 tablet (100 mg total) by mouth daily.   meclizine (ANTIVERT) 25 MG tablet TAKE 1 TABLET(25 MG) BY MOUTH THREE TIMES DAILY AS NEEDED   Multiple Vitamins-Minerals (MULTIVITAMIN ADULT) TABS Take 1 tablet by mouth daily.   nebivolol (BYSTOLIC) 5 MG tablet Take 1 tablet (5 mg total) by mouth daily.   Omega 3 1000 MG CAPS Take 0.1 capsules (100 mg total) by mouth daily.   No facility-administered encounter medications on file as of 04/19/2022.    Allergies (verified) Patient has no known allergies.   History: Past Medical History:  Diagnosis Date   Hypertension    Stroke [facility])    Past Surgical History:  Procedure Laterality Date   NO PAST SURGERIES     Family History  Problem Relation Age of Onset   Stroke Maternal Grandmother    Social History   Socioeconomic History   Marital status: Married    Spouse name: Not on file   Number of children: Not on file   Years of education: Not on file   Highest education level: Not on file  Occupational History   Not on file  Tobacco Use   Smoking status: Never  Smokeless tobacco: Never  Vaping Use   Vaping Use: Never used  Substance and Sexual Activity   Alcohol use: No   Drug use: No   Sexual activity: Not on file  Other Topics Concern   Not on file  Social History Narrative   Not on file   Social Determinants of Health   Financial Resource Strain: Low Risk  (04/19/2022)   Overall Financial Resource Strain (CARDIA)    Difficulty of Paying Living Expenses: Not hard at all  Food Insecurity: No Food Insecurity (04/19/2022)   Hunger Vital Sign    Worried About Running Out of Food in the Last Year: Never true    Ran Out of Food in the Last Year: Never true  Transportation  Needs: No Transportation Needs (04/19/2022)   PRAPARE - Administrator, Civil Service (Medical): No    Lack of Transportation (Non-Medical): No  Physical Activity: Sufficiently Active (04/19/2022)   Exercise Vital Sign    Days of Exercise per Week: 4 days    Minutes of Exercise per Session: 120 min  Stress: No Stress Concern Present (04/19/2022)   [employer] of Occupational Health - Occupational Stress Questionnaire    Feeling of Stress : Not at all  Social Connections: Socially Isolated (04/19/2022)   Social Connection and Isolation Panel [NHANES]    Frequency of Communication with Friends and Family: Never    Frequency of Social Gatherings with Friends and Family: Once a week    Attends Religious Services: More than 4 times per year    Active Member of [employer] or Organizations: No    Attends Engineer, structural: Never    Marital Status: Divorced    Tobacco Counseling Counseling given: Not Answered   Clinical Intake:     Pain : No/denies pain     BMI - recorded: 31.19 Nutritional Status: BMI > 30  Obese Nutritional Risks: None Diabetes: No  How often do you need to have someone help you when you read instructions, pamphlets, or other written materials from your doctor or pharmacy?: 1 - Never What is the last grade level you completed in school?: BS  Diabetic?N/A  Interpreter Needed?: No      Activities of Daily Living    04/19/2022   11:07 AM  In your present state of health, do you have any difficulty performing the following activities:  Hearing? 0  Vision? 0  Difficulty concentrating or making decisions? 0  Walking or climbing stairs? 0  Dressing or bathing? 0  Doing errands, shopping? 0  Preparing Food and eating ? N  Using the Toilet? N  In the past six months, have you accidently leaked urine? N  Do you have problems with loss of bowel control? N  Managing your Medications? N  Managing your Finances? N  Housekeeping or  managing your Housekeeping? N    Patient Care Team: Marianne Sofia, Cordelia Poche as PCP - General (Physician Assistant)  Indicate any recent Medical Services you may have received from other than [facility] providers in the past year (date may be approximate).     Assessment:   This is a routine wellness examination for [facility].  Hearing/Vision screen No results found.  Dietary issues and exercise activities discussed: Current Exercise Habits: Home exercise routine, Type of exercise: walking, Time (Minutes): > 60, Frequency (Times/Week): 4, Weekly Exercise (Minutes/Week): 0, Intensity: MildRecommend low fat/low chol diet   Goals Addressed   None   Depression Screen    04/19/2022  11:04 AM 03/23/2022   12:11 PM 12/15/2021   12:46 PM 09/01/2021   12:46 PM 07/08/2021   10:23 AM 03/17/2021   12:30 PM 09/16/2020   10:08 AM  PHQ 2/9 Scores  PHQ - 2 Score 0 0 0 0 0 0 0  PHQ- 9 Score     0      Fall Risk    04/19/2022   11:07 AM 03/23/2022   12:11 PM 12/15/2021   12:46 PM 09/01/2021   12:45 PM 07/08/2021   10:16 AM  Fall Risk   Falls in the past year? 0 0 0 0 0  Number falls in past yr: 0  0  0  Injury with Fall? 0    0  Risk for fall due to : No Fall Risks      Follow up Falls evaluation completed        FALL RISK PREVENTION PERTAINING TO THE HOME:  Any stairs in or around the home? Yes  If so, are there any without handrails? No  Home free of loose throw rugs in walkways, pet beds, electrical cords, etc? Yes  Adequate lighting in your home to reduce risk of falls? Yes   ASSISTIVE DEVICES UTILIZED TO PREVENT FALLS:  Life alert? No  Use of a cane, walker or w/c? No  Grab bars in the bathroom?  NO Shower chair or bench in shower? No  Elevated toilet seat or a handicapped toilet? No   TIMED UP AND GO:  Was the test performed? Yes .  Length of time to ambulate 10 feet: 15 sec.   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 4 points  What month? 3 points  What time? 3 points  Count back from 20 4 points  Months in reverse 4 points  Repeat phrase 0 points  Total Score 18 points    Immunizations Immunization History  Administered Date(s) Administered   Influenza,inj,Quad PF,6+ Mos 04/03/2022   PFIZER Comirnaty(Gray Top)Covid-19 Tri-Sucrose Vaccine 06/24/2020, 07/15/2020   Tdap 07/15/2021    TDAP status: Up to date  Flu Vaccine status: Up to date  Pneumococcal vaccine status: Due, Education has been provided regarding the importance of this vaccine. Advised may receive this vaccine at local pharmacy or Health Dept. Aware to provide a copy of the vaccination record if obtained from local pharmacy or Health Dept. Verbalized acceptance and understanding.  Covid-19 vaccine status: Completed vaccines  Qualifies for Shingles Vaccine? No   Zostavax completed No   Shingrix Completed?: No.    Education has been provided regarding the importance of this vaccine. Patient has been advised to call insurance company to determine out of pocket expense if they have not yet received this vaccine. Advised may also receive vaccine at local pharmacy or Health Dept. Verbalized acceptance and understanding.  Screening Tests Health Maintenance  Topic Date Due   COLONOSCOPY (Pts [phone] Insurance coverage will need to be confirmed)  07/15/2022 (Originally 06/23/2019)   Medicare Annual Wellness (AWV)  04/20/2023   TETANUS/TDAP  07/16/2031   HIV Screening  Completed   HPV VACCINES  Aged Out   INFLUENZA VACCINE  Discontinued   COVID-19 Vaccine  Discontinued   Hepatitis C Screening  Discontinued    Health Maintenance  There are no preventive care reminders to display for this patient.  Colon cancer screening: - pt refuses colonoscopy and cologuard  Lung Cancer Screening: (Low Dose CT Chest recommended if Age 49-80 years, 30 pack-year  currently smoking OR have quit w/in 15years.) does not qualify.   Lung Cancer Screening Referral: N/A  Additional Screening:  Hepatitis C Screening: does not qualify; Completed N/A  Vision Screening: Recommended annual ophthalmology exams for early detection of glaucoma and other disorders of the eye. Is the patient up to date with their annual eye exam?  Yes  Who is the provider or what is the name of the office in which the patient attends annual eye exams? [facility] If pt is not established with a provider, would they like to be referred to a provider to establish care? No .   Dental Screening: Recommended annual dental exams for proper oral hygiene  Community Resource Referral / Chronic Care Management: CRR required this visit?  No   CCM required this visit?  No      Plan:     I have personally reviewed and noted the following in the patient's chart:   Medical and social history Use of alcohol, tobacco or illicit drugs  Current medications and supplements including opioid prescriptions. Patient is not currently taking opioid prescriptions. Functional ability and status Nutritional status Physical activity Advanced directives List of other physicians Hospitalizations, surgeries, and ER visits in previous 12 months Vitals Screenings to include cognitive, depression, and falls Referrals and appointments  In addition, I have reviewed and discussed with patient certain preventive protocols, quality metrics, and best practice recommendations. A written personalized care plan for preventive services as well as general preventive health recommendations were provided to patient.   Pt due to recheck cmp --- ordered today   SARA R Ky Rumple, PA-C   04/19/2022

## 2022-04-25 ENCOUNTER — Ambulatory Visit: Payer: Medicare Other

## 2022-05-17 ENCOUNTER — Ambulatory Visit: Payer: Medicare Other

## 2022-05-17 ENCOUNTER — Other Ambulatory Visit: Payer: Self-pay | Admitting: Physician Assistant

## 2022-05-17 VITALS — BP 138/90

## 2022-05-17 DIAGNOSIS — I10 Essential (primary) hypertension: Secondary | ICD-10-CM

## 2022-05-17 MED ORDER — NEBIVOLOL HCL 10 MG PO TABS: 10.0000 mg | ORAL_TABLET | Freq: Every day | ORAL | 3 refills | Status: DC

## 2022-05-17 NOTE — Progress Notes (Signed)
Pt was recommended to increase bystolic to 10mg  qd - follow up with me in 3 weeks

## 2022-06-08 ENCOUNTER — Encounter: Payer: Self-pay | Admitting: Physician Assistant

## 2022-06-08 ENCOUNTER — Ambulatory Visit (INDEPENDENT_AMBULATORY_CARE_PROVIDER_SITE_OTHER): Payer: Medicare Other | Admitting: Physician Assistant

## 2022-06-08 VITALS — BP 128/78 | HR 77 | Temp 97.7°F | Ht 72.0 in | Wt 234.0 lb

## 2022-06-08 DIAGNOSIS — I10 Essential (primary) hypertension: Secondary | ICD-10-CM

## 2022-06-08 MED ORDER — NEBIVOLOL HCL 10 MG PO TABS: 10.0000 mg | ORAL_TABLET | Freq: Every day | ORAL | 1 refills | Status: DC

## 2022-06-08 NOTE — Progress Notes (Signed)
Subjective:  Patient ID: Mario [name], male    DOB: Apr 17, 1976  Age: 47 y.o. MRN: [id number]  Chief Complaint  Patient presents with   Hypertension    3 week f/u    HPI  Pt in today for follow up of hypertension.  He is currently taking norvasc 10mg , losartan 100mg  and recently increased bystolic from 5mg  daily to 10mg  daily He is tolerating medication well Denies chest pain/dyspnea or edema Bp has improved Pt due to repeat cmp - had slightly elevated kidney functions at last visit Current Outpatient Medications on File Prior to Visit  Medication Sig Dispense Refill   amLODipine (NORVASC) 10 MG tablet TAKE 1 TABLET(10 MG) BY MOUTH DAILY (Patient taking differently: Take 10 mg by mouth daily.) 90 tablet 0   aspirin EC 325 MG tablet 1 po qd 90 tablet 0   atorvastatin (LIPITOR) 40 MG tablet TAKE 1 TABLET(40 MG) BY MOUTH DAILY 90 tablet 0   loratadine (CLARITIN) 10 MG tablet TK 1 T PO QD FOR ALLERGIES  5   losartan (COZAAR) 100 MG tablet Take 1 tablet (100 mg total) by mouth daily. 90 tablet 0   meclizine (ANTIVERT) 25 MG tablet TAKE 1 TABLET(25 MG) BY MOUTH THREE TIMES DAILY AS NEEDED 30 tablet 1   Multiple Vitamins-Minerals (MULTIVITAMIN ADULT) TABS Take 1 tablet by mouth daily.     Omega 3 1000 MG CAPS Take 0.1 capsules (100 mg total) by mouth daily. 90 each 1   No current facility-administered medications on file prior to visit.   Past Medical History:  Diagnosis Date   Hypertension    Stroke [facility])    Past Surgical History:  Procedure Laterality Date   NO PAST SURGERIES      Family History  Problem Relation Age of Onset   Stroke Maternal Grandmother    Social History   Socioeconomic History   Marital status: Married    Spouse name: Not on file   Number of children: Not on file   Years of education: Not on file   Highest education level: Not on file  Occupational History   Not on file  Tobacco Use   Smoking status: Never   Smokeless tobacco: Never  Vaping Use    Vaping Use: Never used  Substance and Sexual Activity   Alcohol use: No   Drug use: No   Sexual activity: Not on file  Other Topics Concern   Not on file  Social History Narrative   Not on file   Social Determinants of Health   Financial Resource Strain: Low Risk  (04/19/2022)   Overall Financial Resource Strain (CARDIA)    Difficulty of Paying Living Expenses: Not hard at all  Food Insecurity: No Food Insecurity (04/19/2022)   Hunger Vital Sign    Worried About Running Out of Food in the Last Year: Never true    Thor in the Last Year: Never true  Transportation Needs: No Transportation Needs (04/19/2022)   PRAPARE - Hydrologist (Medical): No    Lack of Transportation (Non-Medical): No  Physical Activity: Sufficiently Active (04/19/2022)   Exercise Vital Sign    Days of Exercise per Week: 4 days    Minutes of Exercise per Session: 120 min  Stress: No Stress Concern Present (04/19/2022)   [place]    Feeling of Stress : Not at all  Social Connections: Socially Isolated (04/19/2022)  Social Licensed conveyancer [NHANES]    Frequency of Communication with Friends and Family: Never    Frequency of Social Gatherings with Friends and Family: Once a week    Attends Religious Services: More than 4 times per year    Active Member of [employer] or Organizations: No    Attends Music therapist: Never    Marital Status: Divorced    Review of Systems CONSTITUTIONAL: Negative for chills, fatigue, fever,  CARDIOVASCULAR: Negative for chest pain, dizziness, palpitations and pedal edema.  RESPIRATORY: Negative for recent cough and dyspnea.  GASTROINTESTINAL: Negative for abdominal pain, acid reflux symptoms, constipation, diarrhea, nausea and vomiting.    Objective:  PHYSICAL EXAM:   VS: BP 128/78 (BP Location: Left Arm, Patient Position: Sitting)   Pulse  77   Temp 97.7 F (36.5 C) (Temporal)   Ht 6' (1.829 m)   Wt 234 lb (106.1 kg)   SpO2 96%   BMI 31.74 kg/m   GEN: Well nourished, well developed, in no acute distress  Cardiac: RRR; no murmurs, rubs, Respiratory:  normal respiratory rate and pattern with no distress - normal breath sounds with no rales, rhonchi, wheezes or rubs    Lab Results  Component Value Date   WBC 5.8 04/03/2022   HGB 15.0 04/03/2022   HCT 43.2 04/03/2022   PLT 192 04/03/2022   GLUCOSE 97 04/19/2022   CHOL 145 04/03/2022   TRIG 99 04/03/2022   HDL 51 04/03/2022   LDLCALC 76 04/03/2022   ALT 36 04/19/2022   AST 45 (H) 04/19/2022   NA 140 04/19/2022   K 4.2 04/19/2022   CL 102 04/19/2022   CREATININE 1.52 (H) 04/19/2022   BUN 13 04/19/2022   CO2 24 04/19/2022   TSH 1.020 04/03/2022   INR 1.13 10/22/2014   HGBA1C 5.2 11/16/2015      Assessment & Plan:   Problem List Items Addressed This Visit       Cardiovascular and Mediastinum   Essential hypertension - Primary   Relevant Medications   nebivolol (BYSTOLIC) 10 MG tablet Continue other medications as directed   Other Relevant Orders   Comprehensive metabolic panel  .  Meds ordered this encounter  Medications   nebivolol (BYSTOLIC) 10 MG tablet    Sig: Take 1 tablet (10 mg total) by mouth daily.    Dispense:  90 tablet    Refill:  1    Order Specific Question:   Supervising Provider    Answer:   Marge Duncans [id number]    Orders Placed This Encounter  Procedures   Comprehensive metabolic panel     Follow-up: Return in about 4 months (around 10/07/2022) for chronic fasting .  An After Visit Summary was printed and given to the patient.  Yetta Flock [facility] [phone]

## 2022-06-09 ENCOUNTER — Other Ambulatory Visit: Payer: Self-pay | Admitting: Physician Assistant

## 2022-06-09 DIAGNOSIS — N289 Disorder of kidney and ureter, unspecified: Secondary | ICD-10-CM

## 2022-06-09 LAB — COMPREHENSIVE METABOLIC PANEL
ALT: 37 IU/L (ref 0–44)
AST: 36 IU/L (ref 0–40)
Albumin/Globulin Ratio: 1.6 (ref 1.2–2.2)
Albumin: 4.8 g/dL (ref 4.1–5.1)
Alkaline Phosphatase: 74 IU/L (ref 44–121)
BUN/Creatinine Ratio: 4 — ABNORMAL LOW (ref 9–20)
BUN: 8 mg/dL (ref 6–24)
Bilirubin Total: 1 mg/dL (ref 0.0–1.2)
CO2: 26 mmol/L (ref 20–29)
Calcium: 10 mg/dL (ref 8.7–10.2)
Chloride: 101 mmol/L (ref 96–106)
Creatinine, Ser: 1.86 mg/dL — ABNORMAL HIGH (ref 0.76–1.27)
Globulin, Total: 3 g/dL (ref 1.5–4.5)
Glucose: 73 mg/dL (ref 70–99)
Potassium: 4.6 mmol/L (ref 3.5–5.2)
Sodium: 140 mmol/L (ref 134–144)
Total Protein: 7.8 g/dL (ref 6.0–8.5)
eGFR: 44 mL/min/{1.73_m2} — ABNORMAL LOW (ref >59)

## 2022-06-29 ENCOUNTER — Other Ambulatory Visit: Payer: Self-pay | Admitting: Physician Assistant

## 2022-06-29 ENCOUNTER — Encounter: Payer: Self-pay | Admitting: Physical Medicine & Rehabilitation

## 2022-06-29 ENCOUNTER — Encounter: Payer: Medicare Other | Attending: Physical Medicine & Rehabilitation | Admitting: Physical Medicine & Rehabilitation

## 2022-06-29 VITALS — BP 142/89 | HR 60 | Temp 98.4°F | Ht 72.0 in | Wt 235.0 lb

## 2022-06-29 DIAGNOSIS — G8111 Spastic hemiplegia affecting right dominant side: Secondary | ICD-10-CM | POA: Insufficient documentation

## 2022-06-29 DIAGNOSIS — I10 Essential (primary) hypertension: Secondary | ICD-10-CM

## 2022-06-29 MED ORDER — ABOBOTULINUMTOXINA 500 UNITS IM SOLR: 500.0000 [IU] | Freq: Once | INTRAMUSCULAR | Status: AC

## 2022-06-29 NOTE — Patient Instructions (Signed)

## 2022-06-29 NOTE — Progress Notes (Signed)
Dysport Injection for spasticity using needle EMG guidance  Dilution: 200 Units/ml Indication: Severe spasticity which interferes with ADL,mobility and/or  hygiene and is unresponsive to medication management and other conservative care Informed consent was obtained after describing risks and benefits of the procedure with the patient. This includes bleeding, bruising, infection, excessive weakness, or medication side effects. A REMS form is on file and signed. Needle: 25g 2" needle electrode Number of units per muscle Right lower ext FDL 200 EHL 100 Post tib 200  All injections were done after obtaining appropriate EMG activity and after negative drawback for blood. The patient tolerated the procedure well. Post procedure instructions were given. A followup appointment was made.

## 2022-07-01 ENCOUNTER — Other Ambulatory Visit: Payer: Self-pay | Admitting: Physician Assistant

## 2022-07-01 DIAGNOSIS — I10 Essential (primary) hypertension: Secondary | ICD-10-CM

## 2022-07-02 ENCOUNTER — Other Ambulatory Visit: Payer: Self-pay | Admitting: Physician Assistant

## 2022-07-02 DIAGNOSIS — I10 Essential (primary) hypertension: Secondary | ICD-10-CM

## 2022-07-02 MED ORDER — NEBIVOLOL HCL 10 MG PO TABS: 10.0000 mg | ORAL_TABLET | Freq: Every day | ORAL | 1 refills | Status: DC

## 2022-07-04 ENCOUNTER — Ambulatory Visit: Payer: Medicare Other

## 2022-07-04 ENCOUNTER — Other Ambulatory Visit: Payer: Self-pay | Admitting: Physician Assistant

## 2022-07-04 DIAGNOSIS — N289 Disorder of kidney and ureter, unspecified: Secondary | ICD-10-CM | POA: Diagnosis not present

## 2022-07-04 DIAGNOSIS — I10 Essential (primary) hypertension: Secondary | ICD-10-CM

## 2022-07-04 LAB — COMPREHENSIVE METABOLIC PANEL
ALT: 26 IU/L (ref 0–44)
AST: 35 IU/L (ref 0–40)
Albumin/Globulin Ratio: 1.6 (ref 1.2–2.2)
Albumin: 4.4 g/dL (ref 4.1–5.1)
Alkaline Phosphatase: 66 IU/L (ref 44–121)
BUN/Creatinine Ratio: 6 — ABNORMAL LOW (ref 9–20)
BUN: 10 mg/dL (ref 6–24)
Bilirubin Total: 0.8 mg/dL (ref 0.0–1.2)
CO2: 21 mmol/L (ref 20–29)
Calcium: 9.2 mg/dL (ref 8.7–10.2)
Chloride: 104 mmol/L (ref 96–106)
Creatinine, Ser: 1.67 mg/dL — ABNORMAL HIGH (ref 0.76–1.27)
Globulin, Total: 2.7 g/dL (ref 1.5–4.5)
Glucose: 93 mg/dL (ref 70–99)
Potassium: 4 mmol/L (ref 3.5–5.2)
Sodium: 140 mmol/L (ref 134–144)
Total Protein: 7.1 g/dL (ref 6.0–8.5)
eGFR: 50 mL/min/{1.73_m2} — ABNORMAL LOW (ref >59)

## 2022-07-04 MED ORDER — NEBIVOLOL HCL 10 MG PO TABS: 10.0000 mg | ORAL_TABLET | Freq: Every day | ORAL | 1 refills | Status: DC

## 2022-07-05 ENCOUNTER — Other Ambulatory Visit: Payer: Self-pay | Admitting: Physician Assistant

## 2022-07-05 DIAGNOSIS — N1831 Chronic kidney disease, stage 3a: Secondary | ICD-10-CM

## 2022-07-10 DIAGNOSIS — N1330 Unspecified hydronephrosis: Secondary | ICD-10-CM | POA: Diagnosis not present

## 2022-07-10 DIAGNOSIS — N1831 Chronic kidney disease, stage 3a: Secondary | ICD-10-CM | POA: Diagnosis not present

## 2022-07-24 ENCOUNTER — Encounter: Payer: Self-pay | Admitting: Physician Assistant

## 2022-08-11 ENCOUNTER — Ambulatory Visit: Payer: Medicare Other | Admitting: Physician Assistant

## 2022-09-28 ENCOUNTER — Encounter: Payer: Self-pay | Admitting: Physical Medicine & Rehabilitation

## 2022-09-28 ENCOUNTER — Encounter: Payer: Medicare Other | Attending: Physical Medicine & Rehabilitation | Admitting: Physical Medicine & Rehabilitation

## 2022-09-28 ENCOUNTER — Other Ambulatory Visit: Payer: Self-pay | Admitting: Physician Assistant

## 2022-09-28 VITALS — BP 137/86 | HR 57 | Temp 97.8°F | Ht 72.0 in | Wt 234.0 lb

## 2022-09-28 DIAGNOSIS — G8111 Spastic hemiplegia affecting right dominant side: Secondary | ICD-10-CM

## 2022-09-28 DIAGNOSIS — I10 Essential (primary) hypertension: Secondary | ICD-10-CM

## 2022-09-28 MED ORDER — ABOBOTULINUMTOXINA 500 UNITS IM SOLR
500.0000 [IU] | Freq: Once | INTRAMUSCULAR | Status: AC
  Administered 2022-09-28: 500 [IU] via INTRAMUSCULAR

## 2022-09-28 NOTE — Patient Instructions (Signed)

## 2022-09-28 NOTE — Progress Notes (Signed)
Dysport Injection for spasticity using needle EMG guidance  Dilution: 200 Units/ml Indication: Severe spasticity which interferes with ADL,mobility and/or  hygiene and is unresponsive to medication management and other conservative care Informed consent was obtained after describing risks and benefits of the procedure with the patient. This includes bleeding, bruising, infection, excessive weakness, or medication side effects. A REMS form is on file and signed. Needle: 25g 2" needle electrode Number of units per muscle Right lower ext FDL 200 EHL 100 Post tib 200  All injections were done after obtaining appropriate EMG activity and after negative drawback for blood. The patient tolerated the procedure well. Post procedure instructions were given. A followup appointment was made.  

## 2022-10-06 ENCOUNTER — Ambulatory Visit: Payer: Medicare Other | Admitting: Physician Assistant

## 2022-10-06 ENCOUNTER — Telehealth: Payer: Self-pay

## 2022-10-06 NOTE — Telephone Encounter (Signed)
Called patient left message stating we have to cancel appointment due to provider being sick, stated for patient to call back.

## 2022-10-07 ENCOUNTER — Other Ambulatory Visit: Payer: Self-pay | Admitting: Family Medicine

## 2022-10-07 DIAGNOSIS — E785 Hyperlipidemia, unspecified: Secondary | ICD-10-CM

## 2022-11-08 ENCOUNTER — Ambulatory Visit (INDEPENDENT_AMBULATORY_CARE_PROVIDER_SITE_OTHER): Payer: Medicare Other | Admitting: Physician Assistant

## 2022-11-08 ENCOUNTER — Encounter: Payer: Self-pay | Admitting: Physician Assistant

## 2022-11-08 VITALS — BP 118/88 | HR 69 | Temp 97.2°F | Ht 72.0 in | Wt 238.8 lb

## 2022-11-08 DIAGNOSIS — R269 Unspecified abnormalities of gait and mobility: Secondary | ICD-10-CM

## 2022-11-08 DIAGNOSIS — I69320 Aphasia following cerebral infarction: Secondary | ICD-10-CM | POA: Diagnosis not present

## 2022-11-08 DIAGNOSIS — I69359 Hemiplegia and hemiparesis following cerebral infarction affecting unspecified side: Secondary | ICD-10-CM | POA: Diagnosis not present

## 2022-11-08 DIAGNOSIS — I10 Essential (primary) hypertension: Secondary | ICD-10-CM | POA: Diagnosis not present

## 2022-11-08 DIAGNOSIS — I69398 Other sequelae of cerebral infarction: Secondary | ICD-10-CM

## 2022-11-08 DIAGNOSIS — Z5320 Procedure and treatment not carried out because of patient's decision for unspecified reasons: Secondary | ICD-10-CM

## 2022-11-08 NOTE — Progress Notes (Deleted)
Subjective:  Patient ID: Mario [name], male    DOB: [birth year]  Age: 47 y.o. MRN: [id number]  Chief Complaint  Patient presents with   Medical Management of Chronic Issues    HPI  Pt in today for follow up of hypertension.  He is currently taking norvasc 10mg , losartan 100mg  and recently increased bystolic from 5mg  daily to 10mg  daily He is tolerating medication well Denies chest pain/dyspnea or edema Bp has improved Pt due to repeat cmp - had slightly elevated kidney functions at last visit Current Outpatient Medications on File Prior to Visit  Medication Sig Dispense Refill   amLODipine (NORVASC) 10 MG tablet TAKE 1 TABLET(10 MG) BY MOUTH DAILY 90 tablet 0   aspirin EC 325 MG tablet 1 po qd 90 tablet 0   atorvastatin (LIPITOR) 40 MG tablet TAKE 1 TABLET(40 MG) BY MOUTH DAILY 90 tablet 0   loratadine (CLARITIN) 10 MG tablet TK 1 T PO QD FOR ALLERGIES  5   losartan (COZAAR) 100 MG tablet TAKE 1 TABLET(100 MG) BY MOUTH DAILY 90 tablet 0   meclizine (ANTIVERT) 25 MG tablet TAKE 1 TABLET(25 MG) BY MOUTH THREE TIMES DAILY AS NEEDED 30 tablet 1   Multiple Vitamins-Minerals (MULTIVITAMIN ADULT) TABS Take 1 tablet by mouth daily.     nebivolol (BYSTOLIC) 10 MG tablet Take 1 tablet (10 mg total) by mouth daily. 90 tablet 1   Omega 3 1000 MG CAPS Take 0.1 capsules (100 mg total) by mouth daily. 90 each 1   Current Facility-Administered Medications on File Prior to Visit  Medication Dose Route Frequency Provider Last Rate Last Admin   AbobotulinumtoxinA (DYSPORT) 500 units injection 500 Units  500 Units Intramuscular Once Kirsteins, Victorino Sparrow, MD       Past Medical History:  Diagnosis Date   Hypertension    Stroke [facility])    Past Surgical History:  Procedure Laterality Date   NO PAST SURGERIES      Family History  Problem Relation Age of Onset   Stroke Maternal Grandmother    Social History   Socioeconomic History   Marital status: Married    Spouse name: Not on file   Number  of children: Not on file   Years of education: Not on file   Highest education level: Not on file  Occupational History   Not on file  Tobacco Use   Smoking status: Never   Smokeless tobacco: Never  Vaping Use   Vaping Use: Never used  Substance and Sexual Activity   Alcohol use: No   Drug use: No   Sexual activity: Not on file  Other Topics Concern   Not on file  Social History Narrative   Not on file   Social Determinants of Health   Financial Resource Strain: Low Risk  (04/19/2022)   Overall Financial Resource Strain (CARDIA)    Difficulty of Paying Living Expenses: Not hard at all  Food Insecurity: No Food Insecurity (04/19/2022)   Hunger Vital Sign    Worried About Running Out of Food in the Last Year: Never true    Ran Out of Food in the Last Year: Never true  Transportation Needs: No Transportation Needs (04/19/2022)   PRAPARE - Administrator, Civil Service (Medical): No    Lack of Transportation (Non-Medical): No  Physical Activity: Sufficiently Active (04/19/2022)   Exercise Vital Sign    Days of Exercise per Week: 4 days    Minutes of Exercise per Session: 120  min  Stress: No Stress Concern Present (04/19/2022)   [employer] of Occupational Health - Occupational Stress Questionnaire    Feeling of Stress : Not at all  Social Connections: Socially Isolated (04/19/2022)   Social Connection and Isolation Panel [NHANES]    Frequency of Communication with Friends and Family: Never    Frequency of Social Gatherings with Friends and Family: Once a week    Attends Religious Services: More than 4 times per year    Active Member of [employer] or Organizations: No    Attends Engineer, structural: Never    Marital Status: Divorced    Review of Systems CONSTITUTIONAL: Negative for chills, fatigue, fever,  CARDIOVASCULAR: Negative for chest pain, dizziness, palpitations and pedal edema.  RESPIRATORY: Negative for recent cough and dyspnea.   GASTROINTESTINAL: Negative for abdominal pain, acid reflux symptoms, constipation, diarrhea, nausea and vomiting.    Objective:  PHYSICAL EXAM:   VS: BP 118/88 (BP Location: Left Arm, Patient Position: Sitting, Cuff Size: Large)   Pulse 69   Temp (!) 97.2 F (36.2 C) (Temporal)   Ht 6' (1.829 m)   Wt 238 lb 12.8 oz (108.3 kg)   SpO2 97%   BMI 32.39 kg/m   GEN: Well nourished, well developed, in no acute distress  Cardiac: RRR; no murmurs, rubs, Respiratory:  normal respiratory rate and pattern with no distress - normal breath sounds with no rales, rhonchi, wheezes or rubs    Lab Results  Component Value Date   WBC 5.8 04/03/2022   HGB 15.0 04/03/2022   HCT 43.2 04/03/2022   PLT 192 04/03/2022   GLUCOSE 93 07/04/2022   CHOL 145 04/03/2022   TRIG 99 04/03/2022   HDL 51 04/03/2022   LDLCALC 76 04/03/2022   ALT 26 07/04/2022   AST 35 07/04/2022   NA 140 07/04/2022   K 4.0 07/04/2022   CL 104 07/04/2022   CREATININE 1.67 (H) 07/04/2022   BUN 10 07/04/2022   CO2 21 07/04/2022   TSH 1.020 04/03/2022   INR 1.13 10/22/2014   HGBA1C 5.2 11/16/2015      Assessment & Plan:   Problem List Items Addressed This Visit       Cardiovascular and Mediastinum   Essential hypertension - Primary   Relevant Medications   nebivolol (BYSTOLIC) 10 MG tablet Continue other medications as directed   Other Relevant Orders   Comprehensive metabolic panel  .  No orders of the defined types were placed in this encounter.   No orders of the defined types were placed in this encounter.    Follow-up: Return in about 6 months (around 05/10/2023) for chronic fasting follow-up.  An After Visit Summary was printed and given to the patient.  Jettie Pagan [facility] [phone]

## 2022-11-08 NOTE — Progress Notes (Addendum)
Subjective:  Patient ID: Mario [name], male    DOB: Oct 01, 1975  Age: 47 y.o. 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  He has mild right sided hemiparesis which has improved with therapy and botox therapy.  He continues with botox injection Pt also with aphasia as late effect of CVA - speech has slowly improved over the years   Current Outpatient Medications on File Prior to Visit  Medication Sig Dispense Refill   amLODipine (NORVASC) 10 MG tablet TAKE 1 TABLET(10 MG) BY MOUTH DAILY 90 tablet 0   aspirin EC 325 MG tablet 1 po qd 90 tablet 0   atorvastatin (LIPITOR) 40 MG tablet TAKE 1 TABLET(40 MG) BY MOUTH DAILY 90 tablet 0   loratadine (CLARITIN) 10 MG tablet TK 1 T PO QD FOR ALLERGIES  5   losartan (COZAAR) 100 MG tablet TAKE 1 TABLET(100 MG) BY MOUTH DAILY 90 tablet 0   meclizine (ANTIVERT) 25 MG tablet TAKE 1 TABLET(25 MG) BY MOUTH THREE TIMES DAILY AS NEEDED 30 tablet 1   Multiple Vitamins-Minerals (MULTIVITAMIN ADULT) TABS Take 1 tablet by mouth daily.     nebivolol (BYSTOLIC) 10 MG tablet Take 1 tablet (10 mg total) by mouth daily. 90 tablet 1   Omega 3 1000 MG CAPS Take 0.1 capsules (100 mg total) by mouth daily. 90 each 1    Current Facility-Administered Medications on File Prior to Visit  Medication Dose Route Frequency Provider Last Rate Last Admin   AbobotulinumtoxinA (DYSPORT) 500 units injection 500 Units  500 Units Intramuscular Once Kirsteins, Victorino Sparrow, MD       Past Medical History:  Diagnosis Date   Hypertension    Stroke [facility])    Past Surgical History:  Procedure Laterality Date   NO PAST SURGERIES      Family History  Problem Relation Age of Onset   Stroke Maternal Grandmother    Social History   Socioeconomic History   Marital status: Married    Spouse name: Not on file   Number of children: Not on file   Years of education: Not on file   Highest education level: Not on file  Occupational History   Not on file  Tobacco Use   Smoking status: Never   Smokeless tobacco: Never  Vaping Use   Vaping Use: Never used  Substance and Sexual Activity   Alcohol use: No   Drug use: No   Sexual activity: Not on file  Other Topics Concern   Not on file  Social History Narrative   Not on file   Social Determinants of Health   Financial Resource Strain: Low Risk  (04/19/2022)   Overall Financial Resource Strain (  CARDIA)    Difficulty of Paying Living Expenses: Not hard at all  Food Insecurity: No Food Insecurity (04/19/2022)   Hunger Vital Sign    Worried About Running Out of Food in the Last Year: Never true    Ran Out of Food in the Last Year: Never true  Transportation Needs: No Transportation Needs (04/19/2022)   PRAPARE - Administrator, Civil Service (Medical): No    Lack of Transportation (Non-Medical): No  Physical Activity: Sufficiently Active (04/19/2022)   Exercise Vital Sign    Days of Exercise per Week: 4 days    Minutes of Exercise per Session: 120 min  Stress: No Stress Concern Present (04/19/2022)   [employer] of Occupational Health - Occupational Stress Questionnaire    Feeling of Stress : Not at all  Social Connections: Socially Isolated  (04/19/2022)   Social Connection and Isolation Panel [NHANES]    Frequency of Communication with Friends and Family: Never    Frequency of Social Gatherings with Friends and Family: Once a week    Attends Religious Services: More than 4 times per year    Active Member of [employer] or Organizations: No    Attends Banker Meetings: Never    Marital Status: Divorced   CONSTITUTIONAL: Negative for chills, fatigue, fever, unintentional weight gain and unintentional weight loss.  E/N/T: Negative for ear pain, nasal congestion and sore throat.  CARDIOVASCULAR: Negative for chest pain, dizziness, palpitations and pedal edema.  RESPIRATORY: Negative for recent cough and dyspnea.  GASTROINTESTINAL: Negative for abdominal pain, acid reflux symptoms, constipation, diarrhea, nausea and vomiting.  MSK: see HPI INTEGUMENTARY: Negative for rash.  NEUROLOGICAL: see HPI PSYCHIATRIC: Negative for sleep disturbance and to question depression screen.  Negative for depression, negative for anhedonia.        Objective:  PHYSICAL EXAM:   VS: BP 118/88 (BP Location: Left Arm, Patient Position: Sitting, Cuff Size: Large)   Pulse 69   Temp (!) 97.2 F (36.2 C) (Temporal)   Ht 6' (1.829 m)   Wt 238 lb 12.8 oz (108.3 kg)   SpO2 97%   BMI 32.39 kg/m   GEN: Well nourished, well developed, in no acute distress   Cardiac: RRR; no murmurs, rubs, or gallops,no edema - Respiratory:  normal respiratory rate and pattern with no distress - normal breath sounds with no rales, rhonchi, wheezes or rubs  MS: walks unassisted with mild gait disturbance Skin: warm and dry, no rash   Psych: euthymic mood, appropriate affect and demeanor    Lab Results  Component Value Date   WBC 5.8 04/03/2022   HGB 15.0 04/03/2022   HCT 43.2 04/03/2022   PLT 192 04/03/2022   GLUCOSE 93 07/04/2022   CHOL 145 04/03/2022   TRIG 99 04/03/2022   HDL 51 04/03/2022   LDLCALC 76 04/03/2022   ALT 26 07/04/2022   AST 35  07/04/2022   NA 140 07/04/2022   K 4.0 07/04/2022   CL 104 07/04/2022   CREATININE 1.67 (H) 07/04/2022   BUN 10 07/04/2022   CO2 21 07/04/2022   TSH 1.020 04/03/2022   INR 1.13 10/22/2014   HGBA1C 5.2 11/16/2015      Assessment & Plan:   Problem List Items Addressed This Visit       Cardiovascular and Mediastinum   Left middle cerebral artery stroke (HCC)   Relevant Orders   Continue asa 325mg    Essential hypertension - Primary Continue meds as directed Labwork pending  Nervous and Auditory   Spastic hemiparesis (HCC)   Continue follow up       Hemiparesis and aphasia as late effect of cerebrovascular accident (CVA) (HCC)   Relevant Orders   Continue follow up with specialists          .  No orders of the defined types were placed in this encounter.   Orders Placed This Encounter  Procedures   CBC with Differential/Platelet   Comprehensive metabolic panel   TSH   Lipid panel     Follow-up: Return in about 6 months (around 05/10/2023) for chronic fasting follow-up.  An After Visit Summary was printed and given to the patient.  Jettie Pagan [facility] [phone]

## 2022-11-09 LAB — COMPREHENSIVE METABOLIC PANEL
ALT: 37 IU/L (ref 0–44)
AST: 37 IU/L (ref 0–40)
Albumin/Globulin Ratio: 1.4 (ref 1.2–2.2)
Albumin: 4.6 g/dL (ref 4.1–5.1)
Alkaline Phosphatase: 78 IU/L (ref 44–121)
BUN/Creatinine Ratio: 6 — ABNORMAL LOW (ref 9–20)
BUN: 11 mg/dL (ref 6–24)
Bilirubin Total: 1.1 mg/dL (ref 0.0–1.2)
CO2: 21 mmol/L (ref 20–29)
Calcium: 9.6 mg/dL (ref 8.7–10.2)
Chloride: 99 mmol/L (ref 96–106)
Creatinine, Ser: 1.73 mg/dL — ABNORMAL HIGH (ref 0.76–1.27)
Globulin, Total: 3.2 g/dL (ref 1.5–4.5)
Glucose: 85 mg/dL (ref 70–99)
Potassium: 4.6 mmol/L (ref 3.5–5.2)
Sodium: 136 mmol/L (ref 134–144)
Total Protein: 7.8 g/dL (ref 6.0–8.5)
eGFR: 48 mL/min/{1.73_m2} — ABNORMAL LOW (ref >59)

## 2022-11-09 LAB — CBC WITH DIFFERENTIAL/PLATELET
Basophils Absolute: 0 10*3/uL (ref 0.0–0.2)
Basos: 1 %
EOS (ABSOLUTE): 0.1 10*3/uL (ref 0.0–0.4)
Eos: 2 %
Hematocrit: 42.6 % (ref 37.5–51.0)
Hemoglobin: 14.8 g/dL (ref 13.0–17.7)
Immature Grans (Abs): 0 10*3/uL (ref 0.0–0.1)
Immature Granulocytes: 0 %
Lymphocytes Absolute: 2.1 10*3/uL (ref 0.7–3.1)
Lymphs: 37 %
MCH: 31.7 pg (ref 26.6–33.0)
MCHC: 34.7 g/dL (ref 31.5–35.7)
MCV: 91 fL (ref 79–97)
Monocytes Absolute: 0.3 10*3/uL (ref 0.1–0.9)
Monocytes: 6 %
Neutrophils Absolute: 3.2 10*3/uL (ref 1.4–7.0)
Neutrophils: 54 %
Platelets: 159 10*3/uL (ref 150–450)
RBC: 4.67 x10E6/uL (ref 4.14–5.80)
RDW: 13.7 % (ref 11.6–15.4)
WBC: 5.9 10*3/uL (ref 3.4–10.8)

## 2022-11-09 LAB — LITHOLINK CKD PROGRAM

## 2022-11-09 LAB — LIPID PANEL
Chol/HDL Ratio: 3.5 ratio (ref 0.0–5.0)
Cholesterol, Total: 167 mg/dL (ref 100–199)
HDL: 48 mg/dL (ref >39)
LDL Chol Calc (NIH): 103 mg/dL — ABNORMAL HIGH (ref 0–99)
Triglycerides: 83 mg/dL (ref 0–149)
VLDL Cholesterol Cal: 16 mg/dL (ref 5–40)

## 2022-11-09 LAB — TSH: TSH: 2.62 u[IU]/mL (ref 0.450–4.500)

## 2022-12-06 ENCOUNTER — Other Ambulatory Visit: Payer: Self-pay | Admitting: Physician Assistant

## 2022-12-06 DIAGNOSIS — I10 Essential (primary) hypertension: Secondary | ICD-10-CM

## 2022-12-06 MED ORDER — LOSARTAN POTASSIUM 100 MG PO TABS
ORAL_TABLET | ORAL | 0 refills | Status: AC
Start: 2022-12-06 — End: ?

## 2022-12-28 ENCOUNTER — Ambulatory Visit: Payer: Medicare Other | Admitting: Physical Medicine & Rehabilitation

## 2022-12-28 ENCOUNTER — Other Ambulatory Visit: Payer: Self-pay | Admitting: Physician Assistant

## 2022-12-28 DIAGNOSIS — I10 Essential (primary) hypertension: Secondary | ICD-10-CM

## 2023-01-04 ENCOUNTER — Encounter: Payer: Self-pay | Admitting: Physical Medicine & Rehabilitation

## 2023-01-04 ENCOUNTER — Encounter: Payer: Medicare Other | Attending: Physical Medicine & Rehabilitation | Admitting: Physical Medicine & Rehabilitation

## 2023-01-04 VITALS — BP 122/86 | HR 72 | Ht 72.0 in | Wt 240.0 lb

## 2023-01-04 DIAGNOSIS — G8111 Spastic hemiplegia affecting right dominant side: Secondary | ICD-10-CM | POA: Insufficient documentation

## 2023-01-04 MED ORDER — ABOBOTULINUMTOXINA 500 UNITS IM SOLR
500.0000 [IU] | Freq: Once | INTRAMUSCULAR | Status: AC
Start: 2023-01-04 — End: 2023-01-04
  Administered 2023-01-04: 500 [IU] via INTRAMUSCULAR

## 2023-01-04 MED ORDER — SODIUM CHLORIDE (PF) 0.9 % IJ SOLN
2.0000 mL | Freq: Once | INTRAMUSCULAR | Status: AC
Start: 2023-01-04 — End: 2023-01-04
  Administered 2023-01-04: 2.5 mL

## 2023-01-04 NOTE — Progress Notes (Signed)
Dysport Injection for spasticity using needle EMG guidance  Dilution: 200 Units/ml Indication: Severe spasticity which interferes with ADL,mobility and/or  hygiene and is unresponsive to medication management and other conservative care Informed consent was obtained after describing risks and benefits of the procedure with the patient. This includes bleeding, bruising, infection, excessive weakness, or medication side effects. A REMS form is on file and signed. Needle: 25g 2" needle electrode Number of units per muscle Right lower ext FDL 200 EHL 100 Post tib 200  All injections were done after obtaining appropriate EMG activity and after negative drawback for blood. The patient tolerated the procedure well. Post procedure instructions were given. A followup appointment was made.

## 2023-01-04 NOTE — Patient Instructions (Signed)
You received a Dysport injection today. You may experience muscle pains and aches. He may apply ice 20 minutes every 2 hours as needed for the next 24-48 hours. He also noticed bleeding or bruising in the areas that were injected. May apply Band-Aid. If this bruising is extensive, please notify our office. If there is evidence of increasing redness that occurs 2-3 days after injection. Please call our office. This could be a sign of infection. It is very rare, however. You may experience some muscle weakness in the muscles and injected. This would likely start in about one week.  

## 2023-01-06 ENCOUNTER — Other Ambulatory Visit: Payer: Self-pay | Admitting: Physician Assistant

## 2023-01-06 DIAGNOSIS — E785 Hyperlipidemia, unspecified: Secondary | ICD-10-CM

## 2023-03-04 ENCOUNTER — Other Ambulatory Visit: Payer: Self-pay | Admitting: Physician Assistant

## 2023-03-04 DIAGNOSIS — I10 Essential (primary) hypertension: Secondary | ICD-10-CM

## 2023-04-01 ENCOUNTER — Other Ambulatory Visit: Payer: Self-pay | Admitting: Physician Assistant

## 2023-04-01 DIAGNOSIS — I10 Essential (primary) hypertension: Secondary | ICD-10-CM

## 2023-04-07 ENCOUNTER — Other Ambulatory Visit: Payer: Self-pay | Admitting: Physician Assistant

## 2023-04-07 DIAGNOSIS — E785 Hyperlipidemia, unspecified: Secondary | ICD-10-CM

## 2023-04-10 ENCOUNTER — Encounter: Payer: Medicare Other | Admitting: Physical Medicine & Rehabilitation

## 2023-05-09 ENCOUNTER — Encounter: Payer: Medicare Other | Admitting: Physician Assistant

## 2023-05-14 ENCOUNTER — Encounter: Payer: Self-pay | Admitting: Physician Assistant

## 2023-05-14 ENCOUNTER — Ambulatory Visit (INDEPENDENT_AMBULATORY_CARE_PROVIDER_SITE_OTHER): Payer: Medicare Other | Admitting: Physician Assistant

## 2023-05-14 VITALS — BP 120/78 | HR 78 | Temp 97.7°F | Ht 72.0 in | Wt 243.0 lb

## 2023-05-14 DIAGNOSIS — Z8673 Personal history of transient ischemic attack (TIA), and cerebral infarction without residual deficits: Secondary | ICD-10-CM

## 2023-05-14 DIAGNOSIS — I69398 Other sequelae of cerebral infarction: Secondary | ICD-10-CM

## 2023-05-14 DIAGNOSIS — N1831 Chronic kidney disease, stage 3a: Secondary | ICD-10-CM | POA: Insufficient documentation

## 2023-05-14 DIAGNOSIS — R269 Unspecified abnormalities of gait and mobility: Secondary | ICD-10-CM | POA: Diagnosis not present

## 2023-05-14 DIAGNOSIS — Z0000 Encounter for general adult medical examination without abnormal findings: Secondary | ICD-10-CM

## 2023-05-14 DIAGNOSIS — I10 Essential (primary) hypertension: Secondary | ICD-10-CM

## 2023-05-14 DIAGNOSIS — I69320 Aphasia following cerebral infarction: Secondary | ICD-10-CM

## 2023-05-14 DIAGNOSIS — Z5320 Procedure and treatment not carried out because of patient's decision for unspecified reasons: Secondary | ICD-10-CM | POA: Diagnosis not present

## 2023-05-14 DIAGNOSIS — I69359 Hemiplegia and hemiparesis following cerebral infarction affecting unspecified side: Secondary | ICD-10-CM | POA: Diagnosis not present

## 2023-05-14 DIAGNOSIS — I6523 Occlusion and stenosis of bilateral carotid arteries: Secondary | ICD-10-CM | POA: Insufficient documentation

## 2023-05-14 NOTE — Progress Notes (Signed)
Subjective:   Mario [name] is a 47 y.o. male who presents for Medicare Annual/Subsequent preventive examination.  Visit Complete: In person  Patient Medicare AWV questionnaire was completed by the patient on 05/14/2023; I have confirmed that all information answered by patient is correct and no changes since this date.  Cardiac Risk Factors include: advanced age (>41men, >34 women);family history of premature cardiovascular disease     Objective:    Today's Vitals   05/14/23 0820  BP: 120/78  Pulse: 78  Temp: 97.7 F (36.5 C)  SpO2: 96%  Weight: 243 lb (110.2 kg)  Height: 6' (1.829 m)   Body mass index is 32.96 kg/m.     05/14/2023    9:04 AM 04/19/2022   11:06 AM 01/01/2017   11:31 AM 07/07/2016   12:24 PM 05/18/2016   11:53 AM 03/31/2016   11:07 AM 02/18/2016   10:41 AM  Advanced Directives  Does Patient Have a Medical Advance Directive? No No Yes No No Yes Yes  Type of Surveyor, minerals;Living will   Healthcare Power of [employer] of [place];Living will  Does patient want to make changes to medical advance directive? No - Patient declined        Copy of Healthcare Power of Attorney in Chart?   No - copy requested   No - copy requested No - copy requested  Would patient like information on creating a medical advance directive?  No - Patient declined         Current Medications (verified) Outpatient Encounter Medications as of 05/14/2023  Medication Sig   amLODipine (NORVASC) 10 MG tablet TAKE 1 TABLET(10 MG) BY MOUTH DAILY   aspirin EC 325 MG tablet 1 po qd   atorvastatin (LIPITOR) 40 MG tablet TAKE 1 TABLET(40 MG) BY MOUTH DAILY   loratadine (CLARITIN) 10 MG tablet TK 1 T PO QD FOR ALLERGIES   losartan (COZAAR) 100 MG tablet TAKE 1 TABLET(100 MG) BY MOUTH DAILY   meclizine (ANTIVERT) 25 MG tablet TAKE 1 TABLET(25 MG) BY MOUTH THREE TIMES DAILY AS NEEDED   Multiple Vitamins-Minerals (MULTIVITAMIN ADULT) TABS Take 1  tablet by mouth daily.   nebivolol (BYSTOLIC) 10 MG tablet Take 1 tablet (10 mg total) by mouth daily.   Omega 3 1000 MG CAPS Take 0.1 capsules (100 mg total) by mouth daily.   Facility-Administered Encounter Medications as of 05/14/2023  Medication   AbobotulinumtoxinA (DYSPORT) 500 units injection 500 Units    Allergies (verified) Patient has no known allergies.   History: Past Medical History:  Diagnosis Date   Hypertension    Stroke [facility])    Past Surgical History:  Procedure Laterality Date   NO PAST SURGERIES     Family History  Problem Relation Age of Onset   Stroke Maternal Grandmother    Social History   Socioeconomic History   Marital status: Married    Spouse name: Not on file   Number of children: Not on file   Years of education: Not on file   Highest education level: Not on file  Occupational History   Not on file  Tobacco Use   Smoking status: Never   Smokeless tobacco: Never  Vaping Use   Vaping status: Never Used  Substance and Sexual Activity   Alcohol use: No   Drug use: No   Sexual activity: Not Currently  Other Topics Concern   Not on file  Social History Narrative   Not  on file   Social Determinants of Health   Financial Resource Strain: Low Risk  (05/14/2023)   Overall Financial Resource Strain (CARDIA)    Difficulty of Paying Living Expenses: Not hard at all  Food Insecurity: No Food Insecurity (05/14/2023)   Hunger Vital Sign    Worried About Running Out of Food in the Last Year: Never true    Ran Out of Food in the Last Year: Never true  Transportation Needs: No Transportation Needs (04/19/2022)   PRAPARE - Administrator, Civil Service (Medical): No    Lack of Transportation (Non-Medical): No  Physical Activity: Sufficiently Active (05/14/2023)   Exercise Vital Sign    Days of Exercise per Week: 4 days    Minutes of Exercise per Session: 60 min  Stress: No Stress Concern Present (05/14/2023)   [employer] of  Occupational Health - Occupational Stress Questionnaire    Feeling of Stress : Not at all  Social Connections: Moderately Isolated (05/14/2023)   Social Connection and Isolation Panel [NHANES]    Frequency of Communication with Friends and Family: Three times a week    Frequency of Social Gatherings with Friends and Family: Once a week    Attends Religious Services: More than 4 times per year    Active Member of [employer] or Organizations: No    Attends Engineer, structural: Never    Marital Status: Divorced    Tobacco Counseling Counseling given: Not Answered   Clinical Intake:     Pain : No/denies pain     BMI - recorded: 32.96 Nutritional Status: BMI > 30  Obese Nutritional Risks: None Diabetes: No  How often do you need to have someone help you when you read instructions, pamphlets, or other written materials from your doctor or pharmacy?: 1 - Never What is the last grade level you completed in school?: some college  Interpreter Needed?: No      Activities of Daily Living    05/14/2023    9:03 AM  In your present state of health, do you have any difficulty performing the following activities:  Hearing? 0  Vision? 0  Difficulty concentrating or making decisions? 1  Walking or climbing stairs? 1  Dressing or bathing? 0  Doing errands, shopping? 0  Preparing Food and eating ? N  Using the Toilet? N  In the past six months, have you accidently leaked urine? N  Do you have problems with loss of bowel control? N  Managing your Medications? N  Managing your Finances? N  Housekeeping or managing your Housekeeping? N    Patient Care Team: Marianne Sofia, Cordelia Poche as PCP - General (Physician Assistant)  Indicate any recent Medical Services you may have received from other than [facility] providers in the past year (date may be approximate).     Assessment:   This is a routine wellness examination for [facility].  Hearing/Vision screen No results found.   Goals  Addressed   None   Depression Screen    05/14/2023    9:01 AM 11/08/2022   10:27 AM 09/28/2022    1:01 PM 06/29/2022    1:28 PM 04/19/2022   11:04 AM 03/23/2022   12:11 PM 12/15/2021   12:46 PM  PHQ 2/9 Scores  PHQ - 2 Score 0 0 0 0 0 0 0  PHQ- 9 Score  8         Fall Risk    05/14/2023    9:03 AM 11/08/2022   10:27  AM 09/28/2022    1:01 PM 06/29/2022    1:28 PM 04/19/2022   11:07 AM  Fall Risk   Falls in the past year? 0 0 0 0 0  Number falls in past yr: 0 0  0 0  Injury with Fall? 0 0  0 0  Risk for fall due to : No Fall Risks No Fall Risks   No Fall Risks  Follow up Falls evaluation completed Falls evaluation completed   Falls evaluation completed    MEDICARE RISK AT HOME: Medicare Risk at Home Any stairs in or around the home?: Yes If so, are there any without handrails?: No Home free of loose throw rugs in walkways, pet beds, electrical cords, etc?: Yes Adequate lighting in your home to reduce risk of falls?: Yes Life alert?: No Use of a cane, walker or w/c?: No Grab bars in the bathroom?: No Shower chair or bench in shower?: No Elevated toilet seat or a handicapped toilet?: No  TIMED UP AND GO:  Was the test performed?  Yes  Length of time to ambulate 10 feet: 15 sec Gait steady and fast without use of assistive device    Cognitive Function:    05/14/2023    9:09 AM  MMSE - Mini Mental State Exam  Orientation to time 5  Orientation to Place 5  Registration 3  Attention/ Calculation 0  Recall 3  Language- name 2 objects 2  Language- repeat 0  Language- follow 3 step command 3  Language- read & follow direction 1  Write a sentence 0  Copy design 1  Total score 23        04/19/2022   11:09 AM  6CIT Screen  What Year? 4 points  What month? 3 points  What time? 3 points  Count back from 20 4 points  Months in reverse 4 points  Repeat phrase 0 points  Total Score 18 points    Immunizations Immunization History  Administered Date(s) Administered    Influenza,inj,Quad PF,6+ Mos 04/03/2022   PFIZER Comirnaty(Gray Top)Covid-19 Tri-Sucrose Vaccine 06/24/2020, 07/15/2020   Tdap 07/15/2021    TDAP status: Up to date  Flu Vaccine status: Up to date    Covid-19 vaccine status: Completed vaccines  Qualifies for Shingles Vaccine? No   Zostavax completed No   Shingrix Completed?: No.    Education has been provided regarding the importance of this vaccine. Patient has been advised to call insurance company to determine out of pocket expense if they have not yet received this vaccine. Advised may also receive vaccine at local pharmacy or Health Dept. Verbalized acceptance and understanding.  Screening Tests Health Maintenance  Topic Date Due   INFLUENZA VACCINE  09/03/2023 (Originally 01/04/2023)   Colonoscopy  11/08/2023 (Originally 06/23/2019)   Medicare Annual Wellness (AWV)  05/13/2024   DTaP/Tdap/Td (2 - Td or Tdap) 07/16/2031   HIV Screening  Completed   HPV VACCINES  Aged Out   COVID-19 Vaccine  Discontinued   Hepatitis C Screening  Discontinued    Health Maintenance  There are no preventive care reminders to display for this patient.   PATIENT DENIED COLONOSCOPY.  Lung Cancer Screening: (Low Dose CT Chest recommended if Age 74-80 years, 20 pack-year currently smoking OR have quit w/in 15years.) does not qualify.    Additional Screening:  Hepatitis C Screening: does not qualify;   Vision Screening: Recommended annual ophthalmology exams for early detection of glaucoma and other disorders of the eye. Is the patient up to  date with their annual eye exam?  Yes  Who is the provider or what is the name of the office in which the patient attends annual eye exams? [facility] If pt is not established with a provider, would they like to be referred to a provider to establish care? No .   Dental Screening: Recommended annual dental exams for proper oral hygiene   Community Resource Referral / Chronic Care Management: CRR  required this visit?  No   CCM required this visit?  No     Plan:     I have personally reviewed and noted the following in the patient's chart:   Medical and social history Use of alcohol, tobacco or illicit drugs  Current medications and supplements including opioid prescriptions. Patient is not currently taking opioid prescriptions. Functional ability and status Nutritional status Physical activity Advanced directives List of other physicians Hospitalizations, surgeries, and ER visits in previous 12 months Vitals Screenings to include cognitive, depression, and falls Referrals and appointments  In addition, I have reviewed and discussed with patient certain preventive protocols, quality metrics, and best practice recommendations. A written personalized care plan for preventive services as well as general preventive health recommendations were provided to patient.     Jomarie Longs, CMA   05/14/2023   After Visit Summary: (In Person-Declined) Patient declined AVS at this time.

## 2023-05-14 NOTE — Progress Notes (Signed)
Subjective:  Patient ID: Mario [name], male    DOB: Jul 29, 1975  Age: 47 y.o. MRN: [id number]  Chief Complaint  Patient presents with   Medical Management of Chronic Issues    Hypertension  Hyperlipidemia    Pt here today for follow up of hypertension - he is currently on losartan 100 mg qd , bystolic 10mg  and norvasc 10mg  qd He denies chest pain/sob/fatigue/headache  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 and was referred to Dr Randie Heinz vascular surgeon  - he had carotids evaluated which showed minimal stenosis bilaterally --- was advised to continue ASA and statin and repeat duplex carotid in 5 years - pt was not able to tolerate Plavix   Pt with history of hyperlipidemia - currently on lipitor 40mg  qd and omega 3- is trying to watch diet - due for labwork  Pt with history of stroke with resulting gait disturbance and right foot drop.  He has mild right sided hemiparesis which has improved with therapy and botox therapy.  He continues with botox injection Pt also with aphasia as late effect of CVA - speech has slowly improved over the years  Pt declines colonoscopy Current Outpatient Medications on File Prior to Visit  Medication Sig Dispense Refill   amLODipine (NORVASC) 10 MG tablet TAKE 1 TABLET(10 MG) BY MOUTH DAILY 90 tablet 0   aspirin EC 325 MG tablet 1 po qd 90 tablet 0   atorvastatin (LIPITOR) 40 MG tablet TAKE 1 TABLET(40 MG) BY MOUTH DAILY 90 tablet 0   loratadine (CLARITIN) 10 MG tablet TK 1 T PO QD FOR ALLERGIES  5   losartan (COZAAR) 100 MG tablet TAKE 1 TABLET(100 MG) BY MOUTH DAILY 90 tablet 0   meclizine (ANTIVERT) 25 MG tablet TAKE 1 TABLET(25 MG) BY MOUTH THREE TIMES DAILY AS NEEDED 30 tablet 1   Multiple Vitamins-Minerals (MULTIVITAMIN ADULT) TABS Take 1 tablet by mouth daily.     nebivolol (BYSTOLIC) 10 MG tablet Take 1 tablet (10 mg total) by mouth daily. 90 tablet 1   Omega 3 1000 MG CAPS Take 0.1 capsules (100 mg total) by mouth daily.  90 each 1   Current Facility-Administered Medications on File Prior to Visit  Medication Dose Route Frequency Provider Last Rate Last Admin   AbobotulinumtoxinA (DYSPORT) 500 units injection 500 Units  500 Units Intramuscular Once Kirsteins, Victorino Sparrow, MD       Past Medical History:  Diagnosis Date   Hypertension    Stroke [facility])    Past Surgical History:  Procedure Laterality Date   NO PAST SURGERIES      Family History  Problem Relation Age of Onset   Stroke Maternal Grandmother    Social History   Socioeconomic History   Marital status: Married    Spouse name: Not on file   Number of children: Not on file   Years of education: Not on file   Highest education level: Not on file  Occupational History   Not on file  Tobacco Use   Smoking status: Never   Smokeless tobacco: Never  Vaping Use   Vaping status: Never Used  Substance and Sexual Activity   Alcohol use: No   Drug use: No   Sexual activity: Not Currently  Other Topics Concern   Not on file  Social History Narrative   Not on file   Social Determinants of Health   Financial Resource Strain: Low Risk  (04/19/2022)   Overall Financial Resource  Strain (CARDIA)    Difficulty of Paying Living Expenses: Not hard at all  Food Insecurity: No Food Insecurity (04/19/2022)   Hunger Vital Sign    Worried About Running Out of Food in the Last Year: Never true    Ran Out of Food in the Last Year: Never true  Transportation Needs: No Transportation Needs (04/19/2022)   PRAPARE - Administrator, Civil Service (Medical): No    Lack of Transportation (Non-Medical): No  Physical Activity: Sufficiently Active (04/19/2022)   Exercise Vital Sign    Days of Exercise per Week: 4 days    Minutes of Exercise per Session: 120 min  Stress: No Stress Concern Present (04/19/2022)   [employer] of Occupational Health - Occupational Stress Questionnaire    Feeling of Stress : Not at all  Social Connections:  Socially Isolated (04/19/2022)   Social Connection and Isolation Panel [NHANES]    Frequency of Communication with Friends and Family: Never    Frequency of Social Gatherings with Friends and Family: Once a week    Attends Religious Services: More than 4 times per year    Active Member of [employer] or Organizations: No    Attends Banker Meetings: Never    Marital Status: Divorced   CONSTITUTIONAL: Negative for chills, fatigue, fever, unintentional weight gain and unintentional weight loss.  E/N/T: Negative for ear pain, nasal congestion and sore throat.  CARDIOVASCULAR: Negative for chest pain, dizziness, palpitations and pedal edema.  RESPIRATORY: Negative for recent cough and dyspnea.  GASTROINTESTINAL: Negative for abdominal pain, acid reflux symptoms, constipation, diarrhea, nausea and vomiting.  MSK: Negative for arthralgias and myalgias.  INTEGUMENTARY: Negative for rash.  NEUROLOGICAL: Negative for dizziness and headaches.  PSYCHIATRIC: Negative for sleep disturbance and to question depression screen.  Negative for depression, negative for anhedonia.       Objective:  PHYSICAL EXAM:   VS: BP 120/78   Pulse 78   Temp 97.7 F (36.5 C)   Ht 6' (1.829 m)   Wt 243 lb (110.2 kg)   SpO2 96%   BMI 32.96 kg/m   GEN: Well nourished, well developed, in no acute distress  Cardiac: RRR; no murmurs, rubs, or gallops,no edema - Respiratory:  normal respiratory rate and pattern with no distress - normal breath sounds with no rales, rhonchi, wheezes or rubs GI: normal bowel sounds, no masses or tenderness Skin: warm and dry, no rash  Neuro:  aphasia noted Psych: euthymic mood, appropriate affect and demeanor  Lab Results  Component Value Date   WBC 5.9 11/08/2022   HGB 14.8 11/08/2022   HCT 42.6 11/08/2022   PLT 159 11/08/2022   GLUCOSE 85 11/08/2022   CHOL 167 11/08/2022   TRIG 83 11/08/2022   HDL 48 11/08/2022   LDLCALC 103 (H) 11/08/2022   ALT 37 11/08/2022    AST 37 11/08/2022   NA 136 11/08/2022   K 4.6 11/08/2022   CL 99 11/08/2022   CREATININE 1.73 (H) 11/08/2022   BUN 11 11/08/2022   CO2 21 11/08/2022   TSH 2.620 11/08/2022   INR 1.13 10/22/2014   HGBA1C 5.2 11/16/2015      Assessment & Plan:   Problem List Items Addressed This Visit       Cardiovascular and Mediastinum   Left middle cerebral artery stroke (HCC)   Relevant Orders   Continue asa 325mg    Essential hypertension - Primary Continue meds as directed Labwork pending     Nervous and  Auditory   Spastic hemiparesis (HCC)   Continue follow up       Hemiparesis 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  No orders of the defined types were placed in this encounter.   No orders of the defined types were placed in this encounter.    Follow-up: No follow-ups on file.  An After Visit Summary was printed and given to the patient.  Jettie Pagan [facility] [phone]

## 2023-05-15 LAB — LIPID PANEL
Chol/HDL Ratio: 3.4 {ratio} (ref 0.0–5.0)
Cholesterol, Total: 178 mg/dL (ref 100–199)
HDL: 52 mg/dL (ref >39)
LDL Chol Calc (NIH): 102 mg/dL — ABNORMAL HIGH (ref 0–99)
Triglycerides: 134 mg/dL (ref 0–149)
VLDL Cholesterol Cal: 24 mg/dL (ref 5–40)

## 2023-05-15 LAB — CBC WITH DIFFERENTIAL/PLATELET
Basophils Absolute: 0.1 10*3/uL (ref 0.0–0.2)
Basos: 1 %
EOS (ABSOLUTE): 0.2 10*3/uL (ref 0.0–0.4)
Eos: 3 %
Hematocrit: 48 % (ref 37.5–51.0)
Hemoglobin: 16 g/dL (ref 13.0–17.7)
Immature Grans (Abs): 0 10*3/uL (ref 0.0–0.1)
Immature Granulocytes: 0 %
Lymphocytes Absolute: 2.1 10*3/uL (ref 0.7–3.1)
Lymphs: 34 %
MCH: 31.6 pg (ref 26.6–33.0)
MCHC: 33.3 g/dL (ref 31.5–35.7)
MCV: 95 fL (ref 79–97)
Monocytes Absolute: 0.4 10*3/uL (ref 0.1–0.9)
Monocytes: 6 %
Neutrophils Absolute: 3.4 10*3/uL (ref 1.4–7.0)
Neutrophils: 56 %
Platelets: 196 10*3/uL (ref 150–450)
RBC: 5.06 x10E6/uL (ref 4.14–5.80)
RDW: 13.6 % (ref 11.6–15.4)
WBC: 6.2 10*3/uL (ref 3.4–10.8)

## 2023-05-15 LAB — COMPREHENSIVE METABOLIC PANEL
ALT: 43 [IU]/L (ref 0–44)
AST: 50 [IU]/L — ABNORMAL HIGH (ref 0–40)
Albumin: 4.7 g/dL (ref 4.1–5.1)
Alkaline Phosphatase: 67 [IU]/L (ref 44–121)
BUN/Creatinine Ratio: 8 — ABNORMAL LOW (ref 9–20)
BUN: 16 mg/dL (ref 6–24)
Bilirubin Total: 1.2 mg/dL (ref 0.0–1.2)
CO2: 22 mmol/L (ref 20–29)
Calcium: 10.5 mg/dL — ABNORMAL HIGH (ref 8.7–10.2)
Chloride: 99 mmol/L (ref 96–106)
Creatinine, Ser: 1.97 mg/dL — ABNORMAL HIGH (ref 0.76–1.27)
Globulin, Total: 3.2 g/dL (ref 1.5–4.5)
Glucose: 97 mg/dL (ref 70–99)
Potassium: 4.6 mmol/L (ref 3.5–5.2)
Sodium: 141 mmol/L (ref 134–144)
Total Protein: 7.9 g/dL (ref 6.0–8.5)
eGFR: 41 mL/min/{1.73_m2} — ABNORMAL LOW (ref >59)

## 2023-05-15 LAB — TSH: TSH: 1.22 u[IU]/mL (ref 0.450–4.500)

## 2023-05-16 ENCOUNTER — Other Ambulatory Visit: Payer: Self-pay | Admitting: Physician Assistant

## 2023-05-16 DIAGNOSIS — R899 Unspecified abnormal finding in specimens from other organs, systems and tissues: Secondary | ICD-10-CM

## 2023-05-22 ENCOUNTER — Encounter: Payer: Medicare Other | Attending: Physical Medicine & Rehabilitation | Admitting: Physical Medicine & Rehabilitation

## 2023-05-22 ENCOUNTER — Encounter: Payer: Self-pay | Admitting: Physical Medicine & Rehabilitation

## 2023-05-22 VITALS — BP 121/80 | HR 65 | Ht 72.0 in | Wt 240.0 lb

## 2023-05-22 DIAGNOSIS — G8111 Spastic hemiplegia affecting right dominant side: Secondary | ICD-10-CM | POA: Diagnosis not present

## 2023-05-22 MED ORDER — SODIUM CHLORIDE (PF) 0.9 % IJ SOLN
2.5000 mL | Freq: Once | INTRAMUSCULAR | Status: AC
  Administered 2023-05-22: 2.5 mL via INTRAVENOUS

## 2023-05-22 MED ORDER — ABOBOTULINUMTOXINA 500 UNITS IM SOLR
500.0000 [IU] | Freq: Once | INTRAMUSCULAR | Status: AC
  Administered 2023-05-22: 500 [IU] via INTRAMUSCULAR

## 2023-05-22 NOTE — Progress Notes (Signed)
Dysport Injection for spasticity using needle EMG guidance  Dilution: 200 Units/ml Indication: Severe spasticity which interferes with ADL,mobility and/or  hygiene and is unresponsive to medication management and other conservative care Informed consent was obtained after describing risks and benefits of the procedure with the patient. This includes bleeding, bruising, infection, excessive weakness, or medication side effects. A REMS form is on file and signed. Needle: 25g 2" needle electrode Number of units per muscle Right lower ext FDL 200 EHL 100 Post tib 200  All injections were done after obtaining appropriate EMG activity and after negative drawback for blood. The patient tolerated the procedure well. Post procedure instructions were given. A followup appointment was made.

## 2023-05-28 ENCOUNTER — Other Ambulatory Visit: Payer: Self-pay | Admitting: Family Medicine

## 2023-05-28 DIAGNOSIS — I10 Essential (primary) hypertension: Secondary | ICD-10-CM

## 2023-07-07 ENCOUNTER — Other Ambulatory Visit: Payer: Self-pay | Admitting: Physician Assistant

## 2023-07-07 DIAGNOSIS — E785 Hyperlipidemia, unspecified: Secondary | ICD-10-CM

## 2023-07-07 DIAGNOSIS — I10 Essential (primary) hypertension: Secondary | ICD-10-CM

## 2023-08-22 ENCOUNTER — Other Ambulatory Visit: Payer: Medicare Other

## 2023-08-22 DIAGNOSIS — R899 Unspecified abnormal finding in specimens from other organs, systems and tissues: Secondary | ICD-10-CM

## 2023-08-23 LAB — COMPREHENSIVE METABOLIC PANEL
ALT: 42 IU/L (ref 0–44)
AST: 71 IU/L — ABNORMAL HIGH (ref 0–40)
Albumin: 4.6 g/dL (ref 4.1–5.1)
Alkaline Phosphatase: 66 IU/L (ref 44–121)
BUN/Creatinine Ratio: 9 (ref 9–20)
BUN: 16 mg/dL (ref 6–24)
Bilirubin Total: 0.6 mg/dL (ref 0.0–1.2)
CO2: 22 mmol/L (ref 20–29)
Calcium: 9.7 mg/dL (ref 8.7–10.2)
Chloride: 103 mmol/L (ref 96–106)
Creatinine, Ser: 1.72 mg/dL — ABNORMAL HIGH (ref 0.76–1.27)
Globulin, Total: 2.8 g/dL (ref 1.5–4.5)
Glucose: 114 mg/dL — ABNORMAL HIGH (ref 70–99)
Potassium: 4.3 mmol/L (ref 3.5–5.2)
Sodium: 139 mmol/L (ref 134–144)
Total Protein: 7.4 g/dL (ref 6.0–8.5)
eGFR: 48 mL/min/{1.73_m2} — ABNORMAL LOW (ref >59)

## 2023-09-25 ENCOUNTER — Encounter: Payer: Self-pay | Admitting: Physical Medicine & Rehabilitation

## 2023-09-25 ENCOUNTER — Encounter: Payer: Medicare Other | Attending: Physical Medicine & Rehabilitation | Admitting: Physical Medicine & Rehabilitation

## 2023-09-25 VITALS — BP 118/82 | HR 77 | Ht 72.0 in | Wt 240.0 lb

## 2023-09-25 DIAGNOSIS — G8111 Spastic hemiplegia affecting right dominant side: Secondary | ICD-10-CM

## 2023-09-25 MED ORDER — SODIUM CHLORIDE (PF) 0.9 % IJ SOLN
2.5000 mL | Freq: Once | INTRAMUSCULAR | Status: AC
  Administered 2023-09-25: 2.5 mL

## 2023-09-25 MED ORDER — ABOBOTULINUMTOXINA 500 UNITS IM SOLR
500.0000 [IU] | Freq: Once | INTRAMUSCULAR | Status: AC
  Administered 2023-09-25: 500 [IU] via INTRAMUSCULAR

## 2023-09-25 NOTE — Progress Notes (Signed)
 Dysport  Injection for spasticity using needle EMG guidance  Dilution: 200 Units/ml Indication: Severe spasticity which interferes with ADL,mobility and/or  hygiene and is unresponsive to medication management and other conservative care Informed consent was obtained after describing risks and benefits of the procedure with the patient. This includes bleeding, bruising, infection, excessive weakness, or medication side effects. A REMS form is on file and signed. Needle: 25g 2" needle electrode Number of units per muscle Right lower ext FDL 200  Post tib 300  All injections were done after obtaining appropriate EMG activity and after negative drawback for blood. The patient tolerated the procedure well. Post procedure instructions were given. A followup appointment was made.

## 2023-10-11 ENCOUNTER — Other Ambulatory Visit: Payer: Self-pay | Admitting: Physician Assistant

## 2023-10-11 DIAGNOSIS — I10 Essential (primary) hypertension: Secondary | ICD-10-CM

## 2023-10-11 DIAGNOSIS — E785 Hyperlipidemia, unspecified: Secondary | ICD-10-CM

## 2023-11-12 ENCOUNTER — Ambulatory Visit (INDEPENDENT_AMBULATORY_CARE_PROVIDER_SITE_OTHER): Payer: Medicare Other | Admitting: Physician Assistant

## 2023-11-12 ENCOUNTER — Encounter: Payer: Self-pay | Admitting: Physician Assistant

## 2023-11-12 VITALS — BP 136/86 | HR 92 | Temp 98.0°F | Resp 18 | Ht 72.0 in | Wt 238.0 lb

## 2023-11-12 DIAGNOSIS — E785 Hyperlipidemia, unspecified: Secondary | ICD-10-CM

## 2023-11-12 DIAGNOSIS — I10 Essential (primary) hypertension: Secondary | ICD-10-CM

## 2023-11-12 DIAGNOSIS — I69359 Hemiplegia and hemiparesis following cerebral infarction affecting unspecified side: Secondary | ICD-10-CM | POA: Diagnosis not present

## 2023-11-12 DIAGNOSIS — R739 Hyperglycemia, unspecified: Secondary | ICD-10-CM

## 2023-11-12 DIAGNOSIS — I6523 Occlusion and stenosis of bilateral carotid arteries: Secondary | ICD-10-CM | POA: Diagnosis not present

## 2023-11-12 DIAGNOSIS — N1831 Chronic kidney disease, stage 3a: Secondary | ICD-10-CM | POA: Diagnosis not present

## 2023-11-12 DIAGNOSIS — Z5320 Procedure and treatment not carried out because of patient's decision for unspecified reasons: Secondary | ICD-10-CM

## 2023-11-12 DIAGNOSIS — I69320 Aphasia following cerebral infarction: Secondary | ICD-10-CM

## 2023-11-12 NOTE — Progress Notes (Signed)
 Subjective:  Patient ID: Mario [name], male    DOB: 02-25-1976  Age: 48 y.o. MRN: [id number]  Chief Complaint  Patient presents with   Medical Management of Chronic Issues    Hypertension  Hyperlipidemia    Pt here today for follow up of hypertension - he is currently on losartan  100 mg qd , bystolic  10mg  and norvasc  10mg  qd He denies chest pain/sob/fatigue/headache/edema  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 and was referred to Dr Vikki Graves vascular surgeon  - he had carotids evaluated which showed minimal stenosis bilaterally --- was advised to continue ASA and statin and repeat duplex carotid in 5 years - pt was not able to tolerate Plavix   Pt with history of hyperlipidemia - currently on lipitor 40mg  qd and omega 3- is trying to watch diet - due for labwork  Pt with history of stroke with resulting gait disturbance and right foot drop.  He has mild right sided hemiparesis which has improved with therapy and botox therapy.  He continues with botox injection every 3 months Pt also with aphasia as late effect of CVA - speech has slowly improved over the years  Pt declines colonoscopy Current Outpatient Medications on File Prior to Visit  Medication Sig Dispense Refill   amLODipine  (NORVASC ) 10 MG tablet TAKE 1 TABLET(10 MG) BY MOUTH DAILY 90 tablet 0   aspirin  EC 325 MG tablet 1 po qd 90 tablet 0   atorvastatin  (LIPITOR) 40 MG tablet TAKE 1 TABLET(40 MG) BY MOUTH DAILY 90 tablet 0   loratadine (CLARITIN) 10 MG tablet TK 1 T PO QD FOR ALLERGIES  5   losartan  (COZAAR ) 100 MG tablet TAKE 1 TABLET(100 MG) BY MOUTH DAILY 90 tablet 0   meclizine  (ANTIVERT ) 25 MG tablet TAKE 1 TABLET(25 MG) BY MOUTH THREE TIMES DAILY AS NEEDED 30 tablet 1   Multiple Vitamins-Minerals (MULTIVITAMIN ADULT) TABS Take 1 tablet by mouth daily.     nebivolol  (BYSTOLIC ) 10 MG tablet Take 1 tablet (10 mg total) by mouth daily. 90 tablet 1   Omega 3 1000 MG CAPS Take 0.1 capsules (100 mg  total) by mouth daily. 90 each 1   Current Facility-Administered Medications on File Prior to Visit  Medication Dose Route Frequency Provider Last Rate Last Admin   AbobotulinumtoxinA  (DYSPORT ) 500 units injection 500 Units  500 Units Intramuscular Once Kirsteins, Cecilia Coe, MD       Past Medical History:  Diagnosis Date   Hypertension    Stroke [facility])    Past Surgical History:  Procedure Laterality Date   NO PAST SURGERIES      Family History  Problem Relation Age of Onset   Stroke Maternal Grandmother    Social History   Socioeconomic History   Marital status: Married    Spouse name: Not on file   Number of children: Not on file   Years of education: Not on file   Highest education level: Not on file  Occupational History   Not on file  Tobacco Use   Smoking status: Never   Smokeless tobacco: Never  Vaping Use   Vaping status: Never Used  Substance and Sexual Activity   Alcohol use: No   Drug use: No   Sexual activity: Not Currently  Other Topics Concern   Not on file  Social History Narrative   Not on file   Social Drivers of Health   Financial Resource Strain: Low Risk  (05/14/2023)  Overall Financial Resource Strain (CARDIA)    Difficulty of Paying Living Expenses: Not hard at all  Food Insecurity: No Food Insecurity (05/14/2023)   Hunger Vital Sign    Worried About Running Out of Food in the Last Year: Never true    Ran Out of Food in the Last Year: Never true  Transportation Needs: No Transportation Needs (04/19/2022)   PRAPARE - Administrator, Civil Service (Medical): No    Lack of Transportation (Non-Medical): No  Physical Activity: Sufficiently Active (05/14/2023)   Exercise Vital Sign    Days of Exercise per Week: 4 days    Minutes of Exercise per Session: 60 min  Stress: No Stress Concern Present (05/14/2023)   [employer] of Occupational Health - Occupational Stress Questionnaire    Feeling of Stress : Not at all  Social  Connections: Moderately Isolated (05/14/2023)   Social Connection and Isolation Panel [NHANES]    Frequency of Communication with Friends and Family: Three times a week    Frequency of Social Gatherings with Friends and Family: Once a week    Attends Religious Services: More than 4 times per year    Active Member of [employer] or Organizations: No    Attends Banker Meetings: Never    Marital Status: Divorced   CONSTITUTIONAL: Negative for chills, fatigue, fever, unintentional weight gain and unintentional weight loss.  E/N/T: Negative for ear pain, nasal congestion and sore throat.  CARDIOVASCULAR: Negative for chest pain, dizziness, palpitations and pedal edema.  RESPIRATORY: Negative for recent cough and dyspnea.  GASTROINTESTINAL: Negative for abdominal pain, acid reflux symptoms, constipation, diarrhea, nausea and vomiting.  MSK: Negative for arthralgias and myalgias.  INTEGUMENTARY: Negative for rash.  NEUROLOGICAL: Negative for dizziness and headaches.  PSYCHIATRIC: Negative for sleep disturbance and to question depression screen.  Negative for depression, negative for anhedonia.      Objective:  PHYSICAL EXAM:   VS: BP 136/86   Pulse 92   Temp 98 F (36.7 C) (Temporal)   Resp 18   Ht 6' (1.829 m)   Wt 238 lb (108 kg)   SpO2 96%   BMI 32.28 kg/m   GEN: Well nourished, well developed, in no acute distress   Cardiac: RRR; no murmurs, rubs, or gallops,no edema -  Respiratory:  normal respiratory rate and pattern with no distress - normal breath sounds with no rales, rhonchi, wheezes or rubs  Skin: warm and dry, no rash  Neuro:  aphasia noted Psych: euthymic mood, appropriate affect and demeanor   Lab Results  Component Value Date   WBC 6.2 05/14/2023   HGB 16.0 05/14/2023   HCT 48.0 05/14/2023   PLT 196 05/14/2023   GLUCOSE 114 (H) 08/22/2023   CHOL 178 05/14/2023   TRIG 134 05/14/2023   HDL 52 05/14/2023   LDLCALC 102 (H) 05/14/2023   ALT 42  08/22/2023   AST 71 (H) 08/22/2023   NA 139 08/22/2023   K 4.3 08/22/2023   CL 103 08/22/2023   CREATININE 1.72 (H) 08/22/2023   BUN 16 08/22/2023   CO2 22 08/22/2023   TSH 1.220 05/14/2023   INR 1.13 10/22/2014   HGBA1C 5.2 11/16/2015      Assessment & Plan:   Problem List Items Addressed This Visit       Cardiovascular and Mediastinum      Dyslipidemia Labwork pending Continue lipitor 40mg  qd      Essential hypertension - Primary Continue meds as directed Labwork pending  Chronic kidney disease stage 3a (HCC) Labwork pending  Hyperglycemia Hgb A1c pending     Nervous and Auditory            Hemiparesis and aphasia as late effect of cerebrovascular accident (CVA) (HCC)   Relevant Orders   Continue follow up with specialists   Bilateral carotid artery stenosis Continue ASA and statin  Colonoscopy declined         .  No orders of the defined types were placed in this encounter.   Orders Placed This Encounter  Procedures   CBC with Differential/Platelet   Comprehensive metabolic panel with GFR   TSH   Lipid panel   Hemoglobin A1c     Follow-up: Return in about 6 months (around 05/13/2024) for chronic fasting follow-up.  An After Visit Summary was printed and given to the patient.  Anthonette Bastos [facility] [phone]

## 2023-11-13 ENCOUNTER — Ambulatory Visit: Payer: Self-pay | Admitting: Physician Assistant

## 2023-11-13 LAB — CBC WITH DIFFERENTIAL/PLATELET
Basophils Absolute: 0 10*3/uL (ref 0.0–0.2)
Basos: 1 %
EOS (ABSOLUTE): 0.1 10*3/uL (ref 0.0–0.4)
Eos: 3 %
Hematocrit: 47.3 % (ref 37.5–51.0)
Hemoglobin: 15.7 g/dL (ref 13.0–17.7)
Immature Grans (Abs): 0 10*3/uL (ref 0.0–0.1)
Immature Granulocytes: 0 %
Lymphocytes Absolute: 1.8 10*3/uL (ref 0.7–3.1)
Lymphs: 36 %
MCH: 30.8 pg (ref 26.6–33.0)
MCHC: 33.2 g/dL (ref 31.5–35.7)
MCV: 93 fL (ref 79–97)
Monocytes Absolute: 0.3 10*3/uL (ref 0.1–0.9)
Monocytes: 6 %
Neutrophils Absolute: 2.8 10*3/uL (ref 1.4–7.0)
Neutrophils: 54 %
Platelets: 182 10*3/uL (ref 150–450)
RBC: 5.09 x10E6/uL (ref 4.14–5.80)
RDW: 14.2 % (ref 11.6–15.4)
WBC: 5.1 10*3/uL (ref 3.4–10.8)

## 2023-11-13 LAB — LIPID PANEL
Chol/HDL Ratio: 3.2 ratio (ref 0.0–5.0)
Cholesterol, Total: 163 mg/dL (ref 100–199)
HDL: 51 mg/dL (ref >39)
LDL Chol Calc (NIH): 96 mg/dL (ref 0–99)
Triglycerides: 88 mg/dL (ref 0–149)
VLDL Cholesterol Cal: 16 mg/dL (ref 5–40)

## 2023-11-13 LAB — COMPREHENSIVE METABOLIC PANEL WITH GFR
ALT: 33 IU/L (ref 0–44)
AST: 42 IU/L — ABNORMAL HIGH (ref 0–40)
Albumin: 4.3 g/dL (ref 4.1–5.1)
Alkaline Phosphatase: 68 IU/L (ref 44–121)
BUN/Creatinine Ratio: 7 — ABNORMAL LOW (ref 9–20)
BUN: 11 mg/dL (ref 6–24)
Bilirubin Total: 0.7 mg/dL (ref 0.0–1.2)
CO2: 22 mmol/L (ref 20–29)
Calcium: 9.6 mg/dL (ref 8.7–10.2)
Chloride: 102 mmol/L (ref 96–106)
Creatinine, Ser: 1.58 mg/dL — ABNORMAL HIGH (ref 0.76–1.27)
Globulin, Total: 3 g/dL (ref 1.5–4.5)
Glucose: 103 mg/dL — ABNORMAL HIGH (ref 70–99)
Potassium: 3.8 mmol/L (ref 3.5–5.2)
Sodium: 139 mmol/L (ref 134–144)
Total Protein: 7.3 g/dL (ref 6.0–8.5)
eGFR: 53 mL/min/{1.73_m2} — ABNORMAL LOW (ref >59)

## 2023-11-13 LAB — HEMOGLOBIN A1C
Est. average glucose Bld gHb Est-mCnc: 117 mg/dL
Hgb A1c MFr Bld: 5.7 % — ABNORMAL HIGH (ref 4.8–5.6)

## 2023-11-13 LAB — TSH: TSH: 1.99 u[IU]/mL (ref 0.450–4.500)

## 2023-12-13 ENCOUNTER — Telehealth: Payer: Self-pay

## 2023-12-13 NOTE — Telephone Encounter (Signed)
 Left voicemail asking if he changes pharmacy to [facility]. We received a fax requesting refill from this pharmacy.

## 2023-12-14 ENCOUNTER — Other Ambulatory Visit: Payer: Self-pay | Admitting: Physician Assistant

## 2023-12-14 DIAGNOSIS — I10 Essential (primary) hypertension: Secondary | ICD-10-CM

## 2023-12-14 DIAGNOSIS — E785 Hyperlipidemia, unspecified: Secondary | ICD-10-CM

## 2023-12-14 MED ORDER — AMLODIPINE BESYLATE 10 MG PO TABS: 10.0000 mg | ORAL_TABLET | Freq: Every day | ORAL | 0 refills | Status: DC

## 2023-12-14 MED ORDER — ATORVASTATIN CALCIUM 40 MG PO TABS: ORAL_TABLET | ORAL | 0 refills | Status: DC

## 2023-12-14 NOTE — Telephone Encounter (Signed)
 Copied from CRM [id number]. Topic: Clinical - Medication Refill >> Dec 14, 2023  9:32 AM Tiffini S wrote: Medication:  amLODipine  (NORVASC ) 10 MG tablet,  atorvastatin  (LIPITOR) 40 MG tablet  Has the patient contacted their pharmacy? Yes, Roxy with the pharmacy called in the request, unsure if patient is out of medication  (Agent: If no, request that the patient contact the pharmacy for the refill. If patient does not wish to contact the pharmacy document the reason why and proceed with request.) (Agent: If yes, when and what did the pharmacy advise?)  This is the patient's preferred pharmacy:  [facility] [address]  [place]  [place] [address] [phone]  Is this the correct pharmacy for this prescription? Yes If no, delete pharmacy and type the correct one.   Has the prescription been filled recently? Yes  Is the patient out of the medication? Unknown   Has the patient been seen for an appointment in the last year OR does the patient have an upcoming appointment? Yes  Can we respond through MyChart? No, phone call please   Agent: Please be advised that Rx refills may take up to 3 business days. We ask that you follow-up with your pharmacy.

## 2024-01-01 ENCOUNTER — Encounter: Payer: Self-pay | Admitting: Physical Medicine & Rehabilitation

## 2024-01-01 ENCOUNTER — Encounter: Attending: Physical Medicine & Rehabilitation | Admitting: Physical Medicine & Rehabilitation

## 2024-01-01 VITALS — BP 143/84 | HR 87 | Ht 72.0 in | Wt 238.0 lb

## 2024-01-01 DIAGNOSIS — G8111 Spastic hemiplegia affecting right dominant side: Secondary | ICD-10-CM | POA: Insufficient documentation

## 2024-01-01 MED ORDER — SODIUM CHLORIDE (PF) 0.9 % IJ SOLN
2.5000 mL | Freq: Once | INTRAMUSCULAR | Status: AC
  Administered 2024-01-01: 2.5 mL

## 2024-01-01 MED ORDER — ABOBOTULINUMTOXINA 500 UNITS IM SOLR
500.0000 [IU] | Freq: Once | INTRAMUSCULAR | Status: AC
  Administered 2024-01-01: 500 [IU] via INTRAMUSCULAR

## 2024-01-01 NOTE — Patient Instructions (Signed)
You received a Dysport injection today. You may experience muscle pains and aches. He may apply ice 20 minutes every 2 hours as needed for the next 24-48 hours. He also noticed bleeding or bruising in the areas that were injected. May apply Band-Aid. If this bruising is extensive, please notify our office. If there is evidence of increasing redness that occurs 2-3 days after injection. Please call our office. This could be a sign of infection. It is very rare, however. You may experience some muscle weakness in the muscles and injected. This would likely start in about one week.  

## 2024-01-01 NOTE — Progress Notes (Signed)
 Dysport  Injection for spasticity using needle EMG guidance  Dilution: 200 Units/ml Indication: Severe spasticity which interferes with ADL,mobility and/or  hygiene and is unresponsive to medication management and other conservative care Informed consent was obtained after describing risks and benefits of the procedure with the patient. This includes bleeding, bruising, infection, excessive weakness, or medication side effects. A REMS form is on file and signed. Needle: 25g 2" needle electrode Number of units per muscle Right lower ext FDL 200  Post tib 300  All injections were done after obtaining appropriate EMG activity and after negative drawback for blood. The patient tolerated the procedure well. Post procedure instructions were given. A followup appointment was made.

## 2024-01-11 ENCOUNTER — Other Ambulatory Visit: Payer: Self-pay | Admitting: Physician Assistant

## 2024-01-11 DIAGNOSIS — I10 Essential (primary) hypertension: Secondary | ICD-10-CM

## 2024-01-11 DIAGNOSIS — E785 Hyperlipidemia, unspecified: Secondary | ICD-10-CM

## 2024-04-08 ENCOUNTER — Encounter: Payer: Self-pay | Admitting: Physical Medicine & Rehabilitation

## 2024-04-08 ENCOUNTER — Encounter: Attending: Physical Medicine & Rehabilitation | Admitting: Physical Medicine & Rehabilitation

## 2024-04-08 VITALS — BP 137/92 | HR 102

## 2024-04-08 DIAGNOSIS — G8111 Spastic hemiplegia affecting right dominant side: Secondary | ICD-10-CM | POA: Insufficient documentation

## 2024-04-08 MED ORDER — SODIUM CHLORIDE (PF) 0.9 % IJ SOLN
3.0000 mL | Freq: Once | INTRAMUSCULAR | Status: AC
  Administered 2024-04-08: 3 mL via INTRAVENOUS

## 2024-04-08 MED ORDER — ABOBOTULINUMTOXINA 500 UNITS IM SOLR
500.0000 [IU] | Freq: Once | INTRAMUSCULAR | Status: AC
  Administered 2024-04-08: 500 [IU] via INTRAMUSCULAR

## 2024-04-08 NOTE — Patient Instructions (Signed)
You received a Dysport injection today. You may experience muscle pains and aches. He may apply ice 20 minutes every 2 hours as needed for the next 24-48 hours. He also noticed bleeding or bruising in the areas that were injected. May apply Band-Aid. If this bruising is extensive, please notify our office. If there is evidence of increasing redness that occurs 2-3 days after injection. Please call our office. This could be a sign of infection. It is very rare, however. You may experience some muscle weakness in the muscles and injected. This would likely start in about one week.  

## 2024-04-08 NOTE — Progress Notes (Signed)
 Dysport  Injection for spasticity using needle EMG guidance  Dilution: 200 Units/ml Indication: Severe spasticity which interferes with ADL,mobility and/or  hygiene and is unresponsive to medication management and other conservative care Informed consent was obtained after describing risks and benefits of the procedure with the patient. This includes bleeding, bruising, infection, excessive weakness, or medication side effects. A REMS form is on file and signed. Needle: 25g 2" needle electrode Number of units per muscle Right lower ext FDL 200  Post tib 300  All injections were done after obtaining appropriate EMG activity and after negative drawback for blood. The patient tolerated the procedure well. Post procedure instructions were given. A followup appointment was made.

## 2024-04-11 ENCOUNTER — Other Ambulatory Visit: Payer: Self-pay | Admitting: Physician Assistant

## 2024-04-11 DIAGNOSIS — E785 Hyperlipidemia, unspecified: Secondary | ICD-10-CM

## 2024-04-11 DIAGNOSIS — I10 Essential (primary) hypertension: Secondary | ICD-10-CM

## 2024-04-17 ENCOUNTER — Other Ambulatory Visit: Payer: Self-pay | Admitting: Physician Assistant

## 2024-04-17 DIAGNOSIS — E785 Hyperlipidemia, unspecified: Secondary | ICD-10-CM

## 2024-05-13 ENCOUNTER — Ambulatory Visit: Admitting: Physician Assistant

## 2024-05-21 ENCOUNTER — Ambulatory Visit: Admitting: Physician Assistant

## 2024-05-21 ENCOUNTER — Encounter: Payer: Self-pay | Admitting: Physician Assistant

## 2024-05-21 VITALS — BP 140/100 | HR 88 | Temp 97.6°F | Resp 16 | Ht 72.0 in | Wt 235.0 lb

## 2024-05-21 DIAGNOSIS — I6523 Occlusion and stenosis of bilateral carotid arteries: Secondary | ICD-10-CM

## 2024-05-21 DIAGNOSIS — I69359 Hemiplegia and hemiparesis following cerebral infarction affecting unspecified side: Secondary | ICD-10-CM

## 2024-05-21 DIAGNOSIS — Z5320 Procedure and treatment not carried out because of patient's decision for unspecified reasons: Secondary | ICD-10-CM

## 2024-05-21 DIAGNOSIS — E785 Hyperlipidemia, unspecified: Secondary | ICD-10-CM | POA: Diagnosis not present

## 2024-05-21 DIAGNOSIS — I10 Essential (primary) hypertension: Secondary | ICD-10-CM | POA: Diagnosis not present

## 2024-05-21 DIAGNOSIS — I69320 Aphasia following cerebral infarction: Secondary | ICD-10-CM | POA: Diagnosis not present

## 2024-05-21 DIAGNOSIS — N1831 Chronic kidney disease, stage 3a: Secondary | ICD-10-CM

## 2024-05-21 DIAGNOSIS — R7303 Prediabetes: Secondary | ICD-10-CM | POA: Diagnosis not present

## 2024-05-21 LAB — CBC WITH DIFFERENTIAL/PLATELET
Basophils Absolute: 0 x10E3/uL (ref 0.0–0.2)
Basos: 1 %
EOS (ABSOLUTE): 0.2 x10E3/uL (ref 0.0–0.4)
Eos: 3 %
Hematocrit: 50.5 % (ref 37.5–51.0)
Hemoglobin: 16.7 g/dL (ref 13.0–17.7)
Immature Grans (Abs): 0 x10E3/uL (ref 0.0–0.1)
Immature Granulocytes: 0 %
Lymphocytes Absolute: 1.7 x10E3/uL (ref 0.7–3.1)
Lymphs: 27 %
MCH: 30.6 pg (ref 26.6–33.0)
MCHC: 33.1 g/dL (ref 31.5–35.7)
MCV: 93 fL (ref 79–97)
Monocytes Absolute: 0.3 x10E3/uL (ref 0.1–0.9)
Monocytes: 5 %
Neutrophils Absolute: 4.3 x10E3/uL (ref 1.4–7.0)
Neutrophils: 64 %
Platelets: 178 x10E3/uL (ref 150–450)
RBC: 5.46 x10E6/uL (ref 4.14–5.80)
RDW: 13.8 % (ref 11.6–15.4)
WBC: 6.5 x10E3/uL (ref 3.4–10.8)

## 2024-05-21 LAB — COMPREHENSIVE METABOLIC PANEL WITH GFR
ALT: 68 IU/L — ABNORMAL HIGH (ref 0–44)
AST: 66 IU/L — ABNORMAL HIGH (ref 0–40)
Albumin: 4.6 g/dL (ref 4.1–5.1)
Alkaline Phosphatase: 93 IU/L (ref 47–123)
BUN/Creatinine Ratio: 4 — ABNORMAL LOW (ref 9–20)
BUN: 7 mg/dL (ref 6–24)
Bilirubin Total: 1.1 mg/dL (ref 0.0–1.2)
CO2: 24 mmol/L (ref 20–29)
Calcium: 10 mg/dL (ref 8.7–10.2)
Chloride: 99 mmol/L (ref 96–106)
Creatinine, Ser: 1.85 mg/dL — ABNORMAL HIGH (ref 0.76–1.27)
Globulin, Total: 2.9 g/dL (ref 1.5–4.5)
Glucose: 90 mg/dL (ref 70–99)
Potassium: 4.9 mmol/L (ref 3.5–5.2)
Sodium: 141 mmol/L (ref 134–144)
Total Protein: 7.5 g/dL (ref 6.0–8.5)
eGFR: 44 mL/min/1.73 — ABNORMAL LOW (ref >59)

## 2024-05-21 LAB — LIPID PANEL
Chol/HDL Ratio: 3.1 ratio (ref 0.0–5.0)
Cholesterol, Total: 175 mg/dL (ref 100–199)
HDL: 57 mg/dL (ref >39)
LDL Chol Calc (NIH): 101 mg/dL — ABNORMAL HIGH (ref 0–99)
Triglycerides: 96 mg/dL (ref 0–149)
VLDL Cholesterol Cal: 17 mg/dL (ref 5–40)

## 2024-05-21 LAB — HEMOGLOBIN A1C
Est. average glucose Bld gHb Est-mCnc: 117 mg/dL
Hgb A1c MFr Bld: 5.7 % — ABNORMAL HIGH (ref 4.8–5.6)

## 2024-05-21 LAB — TSH: TSH: 1.78 u[IU]/mL (ref 0.450–4.500)

## 2024-05-21 MED ORDER — NEBIVOLOL HCL 10 MG PO TABS: 10.0000 mg | ORAL_TABLET | Freq: Every day | ORAL | 1 refills | Status: AC

## 2024-05-21 NOTE — Progress Notes (Signed)
 Subjective:  Patient ID: Mario [name], male    DOB: [birth year]  Age: 48 y.o. MRN: [id number]  Chief Complaint  Patient presents with   Medical Management of Chronic Issues    Hypertension  Hyperlipidemia    Pt here today for follow up of hypertension - he is currently on losartan  100 mg qd , bystolic  10mg  and norvasc  10mg  qd He denies chest pain/sob/fatigue/headache/edema - BP elevated today at 140/100 but pt has not taken medication today He states he has been taking bp at home and has been ranging 120/80  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 and was referred to Dr Sheree vascular surgeon  - he had carotids evaluated which showed minimal stenosis bilaterally --- was advised to continue ASA and statin and repeat duplex carotid in 5 years - pt was not able to tolerate Plavix   Pt with history of hyperlipidemia - currently on lipitor 40mg  qd and omega 3- is trying to watch diet - due for labwork  Pt with history of stroke with resulting gait disturbance and right foot drop.  He has mild right sided hemiparesis which has improved with therapy and botox therapy.  He continues with botox injection every 3 months Pt also with aphasia as late effect of CVA - speech has slowly improved over the years  Pt declines colonoscopy Current Outpatient Medications on File Prior to Visit  Medication Sig Dispense Refill   amLODipine  (NORVASC ) 10 MG tablet TAKE 1 TABLET(10 MG) BY MOUTH DAILY 90 tablet 0   aspirin  EC 325 MG tablet 1 po qd 90 tablet 0   atorvastatin  (LIPITOR) 40 MG tablet TAKE 1 TABLET(40 MG) BY MOUTH DAILY 90 tablet 0   loratadine (CLARITIN) 10 MG tablet TK 1 T PO QD FOR ALLERGIES  5   losartan  (COZAAR ) 100 MG tablet TAKE 1 TABLET(100 MG) BY MOUTH DAILY 90 tablet 0   Multiple Vitamins-Minerals (MULTIVITAMIN ADULT) TABS Take 1 tablet by mouth daily.     Omega 3 1000 MG CAPS Take 0.1 capsules (100 mg total) by mouth daily. 90 each 1   Current  Facility-Administered Medications on File Prior to Visit  Medication Dose Route Frequency Provider Last Rate Last Admin   AbobotulinumtoxinA  (DYSPORT ) 500 units injection 500 Units  500 Units Intramuscular Once Kirsteins, Prentice BRAVO, MD       Past Medical History:  Diagnosis Date   Hypertension    Stroke [facility])    Past Surgical History:  Procedure Laterality Date   ABDOMINAL HERNIA REPAIR      Family History  Problem Relation Age of Onset   Stroke Maternal Grandmother    Social History   Socioeconomic History   Marital status: Married    Spouse name: Not on file   Number of children: Not on file   Years of education: Not on file   Highest education level: Not on file  Occupational History   Not on file  Tobacco Use   Smoking status: Never   Smokeless tobacco: Never  Vaping Use   Vaping status: Never Used  Substance and Sexual Activity   Alcohol use: No   Drug use: No   Sexual activity: Not Currently  Other Topics Concern   Not on file  Social History Narrative   Not on file   Social Drivers of Health   Tobacco Use: Low Risk (05/21/2024)   Patient History    Smoking Tobacco Use: Never    Smokeless Tobacco Use:  Never    Passive Exposure: Not on file  Financial Resource Strain: Low Risk (05/14/2023)   Overall Financial Resource Strain (CARDIA)    Difficulty of Paying Living Expenses: Not hard at all  Food Insecurity: No Food Insecurity (05/14/2023)   Hunger Vital Sign    Worried About Running Out of Food in the Last Year: Never true    Ran Out of Food in the Last Year: Never true  Transportation Needs: No Transportation Needs (04/19/2022)   PRAPARE - Administrator, Civil Service (Medical): No    Lack of Transportation (Non-Medical): No  Physical Activity: Sufficiently Active (05/14/2023)   Exercise Vital Sign    Days of Exercise per Week: 4 days    Minutes of Exercise per Session: 60 min  Stress: No Stress Concern Present (05/14/2023)   [employer] of Occupational Health - Occupational Stress Questionnaire    Feeling of Stress : Not at all  Social Connections: Moderately Isolated (05/14/2023)   Social Connection and Isolation Panel    Frequency of Communication with Friends and Family: Three times a week    Frequency of Social Gatherings with Friends and Family: Once a week    Attends Religious Services: More than 4 times per year    Active Member of Clubs or Organizations: No    Attends Banker Meetings: Never    Marital Status: Divorced  Depression (PHQ2-9): Low Risk (01/01/2024)   Depression (PHQ2-9)    PHQ-2 Score: 0  Alcohol Screen: Low Risk (05/14/2023)   Alcohol Screen    Last Alcohol Screening Score (AUDIT): 0  Housing: Low Risk (05/14/2023)   Housing    Last Housing Risk Score: 0  Utilities: Not At Risk (05/14/2023)   AHC Utilities    Threatened with loss of utilities: No  Health Literacy: Adequate Health Literacy (05/14/2023)   B1300 Health Literacy    Frequency of need for help with medical instructions: Never   CONSTITUTIONAL: Negative for chills, fatigue, fever, unintentional weight gain and unintentional weight loss.  E/N/T: Negative for ear pain, nasal congestion and sore throat.  CARDIOVASCULAR: Negative for chest pain, dizziness, palpitations and pedal edema.  RESPIRATORY: Negative for recent cough and dyspnea.  GASTROINTESTINAL: Negative for abdominal pain, acid reflux symptoms, constipation, diarrhea, nausea and vomiting.  MSK: Negative for arthralgias and myalgias.  INTEGUMENTARY: Negative for rash.  NEUROLOGICAL: Negative for dizziness and headaches.  PSYCHIATRIC: Negative for sleep disturbance and to question depression screen.  Negative for depression, negative for anhedonia.      Objective:  PHYSICAL EXAM:   VS: BP (!) 140/100   Pulse 88   Temp 97.6 F (36.4 C)   Resp 16   Ht 6' (1.829 m)   Wt 235 lb (106.6 kg)   SpO2 98%   BMI 31.87 kg/m   GEN: Well nourished, well  developed, in no acute distress  Cardiac: RRR; no murmurs, rubs, or gallops,no edema - Respiratory:  normal respiratory rate and pattern with no distress - normal breath sounds with no rales, rhonchi, wheezes or rubs Skin: warm and dry, no rash  Neuro:  Alert and Oriented x 3,  CN II-Xii grossly intact Psych: euthymic mood, appropriate affect and demeanor   Lab Results  Component Value Date   WBC 5.1 11/12/2023   HGB 15.7 11/12/2023   HCT 47.3 11/12/2023   PLT 182 11/12/2023   GLUCOSE 103 (H) 11/12/2023   CHOL 163 11/12/2023   TRIG 88 11/12/2023   HDL 51  11/12/2023   LDLCALC 96 11/12/2023   ALT 33 11/12/2023   AST 42 (H) 11/12/2023   NA 139 11/12/2023   K 3.8 11/12/2023   CL 102 11/12/2023   CREATININE 1.58 (H) 11/12/2023   BUN 11 11/12/2023   CO2 22 11/12/2023   TSH 1.990 11/12/2023   INR 1.13 10/22/2014   HGBA1C 5.7 (H) 11/12/2023      Assessment & Plan:   Problem List Items Addressed This Visit       Cardiovascular and Mediastinum      Dyslipidemia Labwork pending Continue lipitor 40mg  qd Watch diet      Essential hypertension - Primary Continue meds as directed and take as directed Recheck bp in 2-3 weeks Labwork pending  Chronic kidney disease stage 3a (HCC) Labwork pending  Prediabetes Hgb A1c pending     Nervous and Auditory            Hemiparesis and aphasia as late effect of cerebrovascular accident (CVA) (HCC)   Relevant Orders   Continue follow up with specialists   Bilateral carotid artery stenosis Continue ASA and statin  Colonoscopy declined         .  Meds ordered this encounter  Medications   nebivolol  (BYSTOLIC ) 10 MG tablet    Sig: Take 1 tablet (10 mg total) by mouth daily.    Dispense:  90 tablet    Refill:  1    Supervising Provider:   COX, KIRSTEN [phone]    Orders Placed This Encounter  Procedures   CBC with Differential/Platelet   Comprehensive metabolic panel with GFR   TSH   Lipid panel   Hemoglobin  A1c     Follow-up: Return in about 6 months (around 11/19/2024) for chronic fasting follow-up - bp check in 2-3 weeks nurse visit.  An After Visit Summary was printed and given to the patient.  CAMIE JONELLE NICHOLAUS DEVONNA [facility] [phone]

## 2024-05-22 ENCOUNTER — Ambulatory Visit: Payer: Self-pay | Admitting: Physician Assistant

## 2024-05-22 ENCOUNTER — Other Ambulatory Visit: Payer: Self-pay | Admitting: Physician Assistant

## 2024-05-22 DIAGNOSIS — R899 Unspecified abnormal finding in specimens from other organs, systems and tissues: Secondary | ICD-10-CM

## 2024-06-11 ENCOUNTER — Ambulatory Visit

## 2024-06-11 VITALS — BP 130/88 | HR 87

## 2024-06-11 DIAGNOSIS — I10 Essential (primary) hypertension: Secondary | ICD-10-CM

## 2024-06-11 NOTE — Progress Notes (Signed)
 Patient is in office today for a nurse visit for Blood Pressure Check. Patient blood pressure was 130/87 with pulse of 87, Patient No chest pain, No shortness of breath, No dyspnea on exertion, No orthopnea, No paroxysmal nocturnal dyspnea, No edema, No palpitations, No syncope  Provider made aware, and spoke with patient.

## 2024-07-10 ENCOUNTER — Encounter: Admitting: Physical Medicine & Rehabilitation

## 2024-07-14 ENCOUNTER — Ambulatory Visit

## 2024-07-15 ENCOUNTER — Encounter: Attending: Physical Medicine & Rehabilitation | Admitting: Physical Medicine & Rehabilitation

## 2024-11-20 ENCOUNTER — Ambulatory Visit: Admitting: Physician Assistant
